# Patient Record
Sex: Male | Born: 1950 | Race: White | Hispanic: No | Smoking: Never smoker
Health system: Southern US, Community
[De-identification: ages and names within clinical notes are randomized; demographics above are authoritative.]

## PROBLEM LIST (undated history)

## (undated) DIAGNOSIS — I4891 Unspecified atrial fibrillation: Secondary | ICD-10-CM

## (undated) DIAGNOSIS — K921 Melena: Secondary | ICD-10-CM

## (undated) DIAGNOSIS — K648 Other hemorrhoids: Secondary | ICD-10-CM

## (undated) DIAGNOSIS — G4730 Sleep apnea, unspecified: Secondary | ICD-10-CM

## (undated) DIAGNOSIS — K5909 Other constipation: Secondary | ICD-10-CM

## (undated) DIAGNOSIS — N400 Enlarged prostate without lower urinary tract symptoms: Secondary | ICD-10-CM

## (undated) DIAGNOSIS — C801 Malignant (primary) neoplasm, unspecified: Secondary | ICD-10-CM

## (undated) DIAGNOSIS — I10 Essential (primary) hypertension: Secondary | ICD-10-CM

## (undated) DIAGNOSIS — E7800 Pure hypercholesterolemia, unspecified: Secondary | ICD-10-CM

## (undated) DIAGNOSIS — D500 Iron deficiency anemia secondary to blood loss (chronic): Secondary | ICD-10-CM

## (undated) DIAGNOSIS — Z8709 Personal history of other diseases of the respiratory system: Secondary | ICD-10-CM

## (undated) DIAGNOSIS — E119 Type 2 diabetes mellitus without complications: Secondary | ICD-10-CM

## (undated) DIAGNOSIS — M1990 Unspecified osteoarthritis, unspecified site: Secondary | ICD-10-CM

## (undated) DIAGNOSIS — I499 Cardiac arrhythmia, unspecified: Secondary | ICD-10-CM

## (undated) DIAGNOSIS — B019 Varicella without complication: Secondary | ICD-10-CM

## (undated) DIAGNOSIS — M48061 Spinal stenosis, lumbar region without neurogenic claudication: Secondary | ICD-10-CM

## (undated) DIAGNOSIS — K219 Gastro-esophageal reflux disease without esophagitis: Secondary | ICD-10-CM

## (undated) DIAGNOSIS — B059 Measles without complication: Secondary | ICD-10-CM

## (undated) DIAGNOSIS — B269 Mumps without complication: Secondary | ICD-10-CM

## (undated) DIAGNOSIS — D689 Coagulation defect, unspecified: Secondary | ICD-10-CM

## (undated) DIAGNOSIS — E785 Hyperlipidemia, unspecified: Secondary | ICD-10-CM

## (undated) HISTORY — PX: CARDIAC CATHETERIZATION: SHX172

## (undated) HISTORY — PX: EYE SURGERY: SHX253

## (undated) HISTORY — DX: Iron deficiency anemia secondary to blood loss (chronic): D50.0

## (undated) HISTORY — DX: Coagulation defect, unspecified: D68.9

## (undated) HISTORY — PX: POSTERIOR LAMINECTOMY / DECOMPRESSION LUMBAR SPINE: SUR740

## (undated) HISTORY — PX: CERVICAL FUSION: SHX112

## (undated) HISTORY — PX: KNEE ARTHROSCOPY: SUR90

## (undated) HISTORY — PX: BREAST SURGERY: SHX581

## (undated) HISTORY — PX: BREAST BIOPSY: SHX20

## (undated) HISTORY — PX: JOINT REPLACEMENT: SHX530

---

## 2004-04-23 ENCOUNTER — Ambulatory Visit: Payer: Self-pay | Admitting: Internal Medicine

## 2004-12-29 ENCOUNTER — Ambulatory Visit: Payer: Self-pay

## 2005-03-18 ENCOUNTER — Ambulatory Visit (HOSPITAL_COMMUNITY): Admission: RE | Admit: 2005-03-18 | Discharge: 2005-03-19 | Payer: Self-pay | Admitting: Neurosurgery

## 2009-05-02 HISTORY — PX: CARDIOVASCULAR STRESS TEST: SHX262

## 2009-05-02 HISTORY — PX: TRANSESOPHAGEAL ECHOCARDIOGRAM: SHX273

## 2009-09-04 ENCOUNTER — Inpatient Hospital Stay: Payer: Self-pay | Admitting: Internal Medicine

## 2009-09-20 ENCOUNTER — Ambulatory Visit: Payer: Self-pay

## 2009-09-23 ENCOUNTER — Ambulatory Visit: Payer: Self-pay | Admitting: Cardiovascular Disease

## 2009-11-13 ENCOUNTER — Ambulatory Visit: Payer: Self-pay | Admitting: General Practice

## 2011-11-22 ENCOUNTER — Other Ambulatory Visit: Payer: Self-pay | Admitting: Neurosurgery

## 2011-11-23 ENCOUNTER — Encounter (HOSPITAL_COMMUNITY): Payer: Self-pay | Admitting: Pharmacy Technician

## 2011-11-23 ENCOUNTER — Encounter (HOSPITAL_COMMUNITY)
Admission: RE | Admit: 2011-11-23 | Discharge: 2011-11-23 | Disposition: A | Discharge status: Home / Self Care | Payer: PRIVATE HEALTH INSURANCE | Source: Ambulatory Visit | Attending: Neurosurgery | Admitting: Neurosurgery

## 2011-11-23 ENCOUNTER — Encounter (HOSPITAL_COMMUNITY): Payer: Self-pay

## 2011-11-23 ENCOUNTER — Encounter (HOSPITAL_COMMUNITY): Payer: Self-pay | Admitting: *Deleted

## 2011-11-23 HISTORY — DX: Sleep apnea, unspecified: G47.30

## 2011-11-23 HISTORY — DX: Gastro-esophageal reflux disease without esophagitis: K21.9

## 2011-11-23 HISTORY — DX: Unspecified osteoarthritis, unspecified site: M19.90

## 2011-11-23 HISTORY — DX: Pure hypercholesterolemia, unspecified: E78.00

## 2011-11-23 LAB — CBC
HCT: 47.3 % (ref 39.0–52.0)
Hemoglobin: 16.2 g/dL (ref 13.0–17.0)
MCH: 30.6 pg (ref 26.0–34.0)
MCHC: 34.2 g/dL (ref 30.0–36.0)
Platelets: 155 10*3/uL (ref 150–400)
WBC: 5.7 10*3/uL (ref 4.0–10.5)

## 2011-11-23 LAB — SURGICAL PCR SCREEN
MRSA, PCR: NEGATIVE
Staphylococcus aureus: NEGATIVE

## 2011-11-23 NOTE — Progress Notes (Addendum)
Requested sleep study, ECHO, heart cath, stress test, EKG, last OV from Dr. Roanna Raider. Pt denies all cardiac dx, states in 2011 he had what turned out to be pleurisy, but was worked up for possible MI.

## 2011-11-23 NOTE — Pre-Procedure Instructions (Addendum)
Brandon Gibson  11/23/2011   Your procedure is scheduled on:  Friday July 26  Report to [facility] at 5:30 AM.  Call this number if you have problems the morning of surgery: [phone]   Remember:   Do not eat food:After Midnight.  May have clear liquids:until Midnight .  Clear liquids include soda, tea, black coffee, apple or grape juice, broth.  Take these medicines the morning of surgery with A SIP OF WATER: none  Stop aspirin, Coumadin, Plavix, Effient, and herbal medications today.   Do not wear jewelry, make-up or nail polish.  Do not wear lotions, powders, or perfumes. You may wear deodorant.  Do not shave 48 hours prior to surgery. Men may shave face and neck.  Do not bring valuables to the hospital.  Contacts, dentures or bridgework may not be worn into surgery.  Leave suitcase in the car. After surgery it may be brought to your room.  For patients admitted to the hospital, checkout time is 11:00 AM the day of discharge.   Patients discharged the day of surgery will not be allowed to drive home.  Name and phone number of your driver: NA  Special Instructions: CHG Shower Use Special Wash: 1/2 bottle night before surgery and 1/2 bottle morning of surgery.   Please read over the following fact sheets that you were given: Pain Booklet, Coughing and Deep Breathing and Surgical Site Infection Prevention

## 2011-11-24 MED ORDER — CEFAZOLIN SODIUM-DEXTROSE 2-3 GM-% IV SOLR
2.0000 g | INTRAVENOUS | Status: AC
  Administered 2011-11-25: 2 g via INTRAVENOUS
  Filled 2011-11-24: qty 50

## 2011-11-24 NOTE — Consult Note (Signed)
Anesthesia Chart Review:  Patient is a 62 year old male scheduled for C4-5, C5-6 ACDF on 11/25/11 by Dr. Jeral Fruit.  History includes non-smoker, obesity with BMI 32, hypercholesterolemia, GERD, OSA, arthritis, pleurisy, history of prior breast and cervical and lumbar surgeries.  PCP is Dr. Yves Dill.  Labs showed a normal CBC.  He had a work-up for chest pain in 2011.  A stress test at [facility]) on 09/15/09 showed ischemia in the RCA territory with normal EF 67%.  He subsequently underwent a cardiac cath at [facility] on 09/23/09 that showed: The coronary circulation is right dominant.  LAD, CX, RCA all showed only minor irregularities.    Echo on 09/18/09 ([facility]) showed normal LV systolic function, mild LVH with mild diastolic dysfunction, trace MR, trace PR, trace TR, normal PA pressure.  His last EKG was from 09/15/09 and showed NSR. Negative T wave in aVL.    He did not meet Anesthesia requirements for a pre-operative EKG or CXR.   Shonna Chock, PA-C

## 2011-11-25 ENCOUNTER — Encounter (HOSPITAL_COMMUNITY)
Admission: RE | Disposition: A | Discharge status: Home / Self Care | Payer: Self-pay | Source: Ambulatory Visit | Attending: Neurosurgery

## 2011-11-25 ENCOUNTER — Ambulatory Visit (HOSPITAL_COMMUNITY): Payer: PRIVATE HEALTH INSURANCE

## 2011-11-25 ENCOUNTER — Encounter (HOSPITAL_COMMUNITY): Payer: Self-pay | Admitting: *Deleted

## 2011-11-25 ENCOUNTER — Encounter (HOSPITAL_COMMUNITY): Payer: Self-pay | Admitting: Vascular Surgery

## 2011-11-25 ENCOUNTER — Inpatient Hospital Stay (HOSPITAL_COMMUNITY)
Admission: RE | Admit: 2011-11-25 | Discharge: 2011-11-27 | DRG: 473 | Disposition: A | Discharge status: Home / Self Care | Payer: PRIVATE HEALTH INSURANCE | Source: Ambulatory Visit | Attending: Neurosurgery | Admitting: Neurosurgery

## 2011-11-25 ENCOUNTER — Ambulatory Visit (HOSPITAL_COMMUNITY): Payer: PRIVATE HEALTH INSURANCE | Admitting: Vascular Surgery

## 2011-11-25 DIAGNOSIS — M47817 Spondylosis without myelopathy or radiculopathy, lumbosacral region: Principal | ICD-10-CM | POA: Diagnosis present

## 2011-11-25 DIAGNOSIS — G4730 Sleep apnea, unspecified: Secondary | ICD-10-CM | POA: Diagnosis present

## 2011-11-25 DIAGNOSIS — E7800 Pure hypercholesterolemia, unspecified: Secondary | ICD-10-CM | POA: Diagnosis present

## 2011-11-25 DIAGNOSIS — Z79899 Other long term (current) drug therapy: Secondary | ICD-10-CM

## 2011-11-25 DIAGNOSIS — K219 Gastro-esophageal reflux disease without esophagitis: Secondary | ICD-10-CM | POA: Diagnosis present

## 2011-11-25 DIAGNOSIS — Z01812 Encounter for preprocedural laboratory examination: Secondary | ICD-10-CM

## 2011-11-25 DIAGNOSIS — M129 Arthropathy, unspecified: Secondary | ICD-10-CM | POA: Diagnosis present

## 2011-11-25 DIAGNOSIS — M5030 Other cervical disc degeneration, unspecified cervical region: Secondary | ICD-10-CM | POA: Diagnosis present

## 2011-11-25 DIAGNOSIS — R1310 Dysphagia, unspecified: Secondary | ICD-10-CM | POA: Diagnosis not present

## 2011-11-25 HISTORY — PX: ANTERIOR CERVICAL DECOMP/DISCECTOMY FUSION: SHX1161

## 2011-11-25 HISTORY — DX: Personal history of other diseases of the respiratory system: Z87.09

## 2011-11-25 LAB — BASIC METABOLIC PANEL
CO2: 27 mEq/L (ref 19–32)
Calcium: 9.3 mg/dL (ref 8.4–10.5)
Chloride: 109 mEq/L (ref 96–112)
Glucose, Bld: 127 mg/dL — ABNORMAL HIGH (ref 70–99)
Sodium: 145 mEq/L (ref 135–145)

## 2011-11-25 SURGERY — ANTERIOR CERVICAL DECOMPRESSION/DISCECTOMY FUSION 2 LEVELS
Anesthesia: General | Site: Spine Cervical | Wound class: Clean

## 2011-11-25 MED ORDER — NEOSTIGMINE METHYLSULFATE 1 MG/ML IJ SOLN
INTRAMUSCULAR | Status: DC | PRN
  Administered 2011-11-25: 5 mg via INTRAVENOUS

## 2011-11-25 MED ORDER — ONDANSETRON HCL 4 MG/2ML IJ SOLN
INTRAMUSCULAR | Status: DC | PRN
  Administered 2011-11-25: 4 mg via INTRAVENOUS

## 2011-11-25 MED ORDER — GLYCOPYRROLATE 0.2 MG/ML IJ SOLN
INTRAMUSCULAR | Status: DC | PRN
  Administered 2011-11-25: .8 mg via INTRAVENOUS

## 2011-11-25 MED ORDER — MENTHOL 3 MG MT LOZG
1.0000 | LOZENGE | OROMUCOSAL | Status: DC | PRN
  Filled 2011-11-25: qty 9

## 2011-11-25 MED ORDER — HYDROMORPHONE HCL PF 1 MG/ML IJ SOLN
INTRAMUSCULAR | Status: AC
  Filled 2011-11-25: qty 1

## 2011-11-25 MED ORDER — THROMBIN 5000 UNITS EX KIT
PACK | CUTANEOUS | Status: DC | PRN
  Administered 2011-11-25 (×2): 5000 [IU] via TOPICAL

## 2011-11-25 MED ORDER — MIDAZOLAM HCL 2 MG/2ML IJ SOLN: 0.5000 mg | Freq: Once | INTRAMUSCULAR | Status: DC | PRN

## 2011-11-25 MED ORDER — DEXAMETHASONE SODIUM PHOSPHATE 4 MG/ML IJ SOLN
INTRAMUSCULAR | Status: DC | PRN
  Administered 2011-11-25: 10 mg via INTRAVENOUS

## 2011-11-25 MED ORDER — VECURONIUM BROMIDE 10 MG IV SOLR
INTRAVENOUS | Status: DC | PRN
  Administered 2011-11-25: 2 mg via INTRAVENOUS

## 2011-11-25 MED ORDER — ACETAMINOPHEN 325 MG PO TABS: 650.0000 mg | ORAL_TABLET | ORAL | Status: DC | PRN

## 2011-11-25 MED ORDER — ROCURONIUM BROMIDE 100 MG/10ML IV SOLN
INTRAVENOUS | Status: DC | PRN
  Administered 2011-11-25: 50 mg via INTRAVENOUS

## 2011-11-25 MED ORDER — PHENOL 1.4 % MT LIQD
1.0000 | OROMUCOSAL | Status: DC | PRN
  Administered 2011-11-27: 1 via OROMUCOSAL
  Filled 2011-11-25 (×2): qty 177

## 2011-11-25 MED ORDER — PANTOPRAZOLE SODIUM 40 MG PO TBEC
40.0000 mg | DELAYED_RELEASE_TABLET | Freq: Every day | ORAL | Status: DC
  Administered 2011-11-25 – 2011-11-27 (×3): 40 mg via ORAL

## 2011-11-25 MED ORDER — MIDAZOLAM HCL 5 MG/5ML IJ SOLN
INTRAMUSCULAR | Status: DC | PRN
  Administered 2011-11-25: 2 mg via INTRAVENOUS

## 2011-11-25 MED ORDER — SIMVASTATIN 20 MG PO TABS
20.0000 mg | ORAL_TABLET | Freq: Every day | ORAL | Status: DC
  Administered 2011-11-25 – 2011-11-26 (×2): 20 mg via ORAL
  Filled 2011-11-25 (×3): qty 1

## 2011-11-25 MED ORDER — OXYCODONE-ACETAMINOPHEN 5-325 MG PO TABS
1.0000 | ORAL_TABLET | ORAL | Status: DC | PRN
  Administered 2011-11-25 – 2011-11-27 (×9): 2 via ORAL
  Administered 2011-11-27: 1 via ORAL
  Administered 2011-11-27 (×2): 2 via ORAL
  Filled 2011-11-25 (×12): qty 2

## 2011-11-25 MED ORDER — SODIUM CHLORIDE 0.9 % IJ SOLN: 3.0000 mL | INTRAMUSCULAR | Status: DC | PRN

## 2011-11-25 MED ORDER — SODIUM CHLORIDE 0.9 % IV SOLN: INTRAVENOUS | Status: DC

## 2011-11-25 MED ORDER — SODIUM CHLORIDE 0.9 % IV SOLN
250.0000 mL | INTRAVENOUS | Status: DC
  Administered 2011-11-25: 250 mL via INTRAVENOUS

## 2011-11-25 MED ORDER — HEMOSTATIC AGENTS (NO CHARGE) OPTIME
TOPICAL | Status: DC | PRN
  Administered 2011-11-25: 1 via TOPICAL

## 2011-11-25 MED ORDER — FENTANYL CITRATE 0.05 MG/ML IJ SOLN
INTRAMUSCULAR | Status: DC | PRN
  Administered 2011-11-25: 250 ug via INTRAVENOUS

## 2011-11-25 MED ORDER — DEXAMETHASONE SODIUM PHOSPHATE 4 MG/ML IJ SOLN
4.0000 mg | Freq: Four times a day (QID) | INTRAMUSCULAR | Status: DC
  Administered 2011-11-27: 4 mg via INTRAVENOUS
  Filled 2011-11-25 (×8): qty 1

## 2011-11-25 MED ORDER — DIAZEPAM 5 MG PO TABS
5.0000 mg | ORAL_TABLET | Freq: Four times a day (QID) | ORAL | Status: DC | PRN
  Filled 2011-11-25: qty 1

## 2011-11-25 MED ORDER — LIDOCAINE HCL 4 % MT SOLN
OROMUCOSAL | Status: DC | PRN
  Administered 2011-11-25: 4 mL via TOPICAL

## 2011-11-25 MED ORDER — THROMBIN 5000 UNITS EX SOLR
OROMUCOSAL | Status: DC | PRN
  Administered 2011-11-25: 10:00:00 via TOPICAL

## 2011-11-25 MED ORDER — LACTATED RINGERS IV SOLN
INTRAVENOUS | Status: DC | PRN
  Administered 2011-11-25 (×2): via INTRAVENOUS

## 2011-11-25 MED ORDER — ACETAMINOPHEN 650 MG RE SUPP: 650.0000 mg | RECTAL | Status: DC | PRN

## 2011-11-25 MED ORDER — CEFAZOLIN SODIUM 1-5 GM-% IV SOLN
1.0000 g | Freq: Three times a day (TID) | INTRAVENOUS | Status: AC
  Administered 2011-11-25 (×2): 1 g via INTRAVENOUS
  Filled 2011-11-25 (×2): qty 50

## 2011-11-25 MED ORDER — SODIUM CHLORIDE 0.9 % IJ SOLN
3.0000 mL | Freq: Two times a day (BID) | INTRAMUSCULAR | Status: DC
  Administered 2011-11-25 – 2011-11-27 (×4): 3 mL via INTRAVENOUS

## 2011-11-25 MED ORDER — MORPHINE SULFATE 2 MG/ML IJ SOLN
2.0000 mg | INTRAMUSCULAR | Status: DC | PRN
  Administered 2011-11-25 – 2011-11-26 (×2): 2 mg via INTRAVENOUS
  Filled 2011-11-25 (×3): qty 1

## 2011-11-25 MED ORDER — PROMETHAZINE HCL 25 MG/ML IJ SOLN: 6.2500 mg | INTRAMUSCULAR | Status: DC | PRN

## 2011-11-25 MED ORDER — 0.9 % SODIUM CHLORIDE (POUR BTL) OPTIME
TOPICAL | Status: DC | PRN
  Administered 2011-11-25: 1000 mL

## 2011-11-25 MED ORDER — MEPERIDINE HCL 25 MG/ML IJ SOLN: 6.2500 mg | INTRAMUSCULAR | Status: DC | PRN

## 2011-11-25 MED ORDER — ONDANSETRON HCL 4 MG/2ML IJ SOLN: 4.0000 mg | INTRAMUSCULAR | Status: DC | PRN

## 2011-11-25 MED ORDER — HYDROMORPHONE HCL PF 1 MG/ML IJ SOLN
0.2500 mg | INTRAMUSCULAR | Status: DC | PRN
  Administered 2011-11-25 (×2): 0.5 mg via INTRAVENOUS

## 2011-11-25 MED ORDER — DEXAMETHASONE 4 MG PO TABS
4.0000 mg | ORAL_TABLET | Freq: Four times a day (QID) | ORAL | Status: DC
  Administered 2011-11-25 – 2011-11-27 (×8): 4 mg via ORAL
  Filled 2011-11-25 (×12): qty 1

## 2011-11-25 MED ORDER — PROPOFOL 10 MG/ML IV EMUL
INTRAVENOUS | Status: DC | PRN
  Administered 2011-11-25: 200 mg via INTRAVENOUS

## 2011-11-25 MED ORDER — LIDOCAINE HCL (CARDIAC) 20 MG/ML IV SOLN
INTRAVENOUS | Status: DC | PRN
  Administered 2011-11-25: 20 mg via INTRAVENOUS

## 2011-11-25 SURGICAL SUPPLY — 58 items
APL SKNCLS STERI-STRIP NONHPOA (GAUZE/BANDAGES/DRESSINGS) ×1
BANDAGE GAUZE ELAST BULKY 4 IN (GAUZE/BANDAGES/DRESSINGS) ×4 IMPLANT
BENZOIN TINCTURE PRP APPL 2/3 (GAUZE/BANDAGES/DRESSINGS) ×2 IMPLANT
BIT DRILL SM SPINE QC 14 (BIT) ×2 IMPLANT
BLADE ULTRA TIP 2M (BLADE) ×2 IMPLANT
BUR BARREL STRAIGHT FLUTE 4.0 (BURR) ×2 IMPLANT
BUR MATCHSTICK NEURO 3.0 LAGG (BURR) ×4 IMPLANT
CANISTER SUCTION 2500CC (MISCELLANEOUS) ×2 IMPLANT
CLOTH BEACON ORANGE TIMEOUT ST (SAFETY) ×2 IMPLANT
CONT SPEC 4OZ CLIKSEAL STRL BL (MISCELLANEOUS) ×2 IMPLANT
COVER MAYO STAND STRL (DRAPES) ×2 IMPLANT
DRAIN JACKSON PRATT 10MM FLAT (MISCELLANEOUS) ×2 IMPLANT
DRAPE LAPAROTOMY 100X72 PEDS (DRAPES) ×2 IMPLANT
DRAPE MICROSCOPE LEICA (MISCELLANEOUS) ×2 IMPLANT
DRAPE POUCH INSTRU U-SHP 10X18 (DRAPES) ×2 IMPLANT
DURAPREP 6ML APPLICATOR 50/CS (WOUND CARE) ×2 IMPLANT
ELECT REM PT RETURN 9FT ADLT (ELECTROSURGICAL) ×2
ELECTRODE REM PT RTRN 9FT ADLT (ELECTROSURGICAL) ×1 IMPLANT
EVACUATOR SILICONE 100CC (DRAIN) ×2 IMPLANT
GAUZE SPONGE 4X4 16PLY XRAY LF (GAUZE/BANDAGES/DRESSINGS) IMPLANT
GLOVE BIOGEL M 8.0 STRL (GLOVE) ×2 IMPLANT
GLOVE BIOGEL PI IND STRL 8 (GLOVE) ×1 IMPLANT
GLOVE BIOGEL PI IND STRL 8.5 (GLOVE) ×1 IMPLANT
GLOVE BIOGEL PI INDICATOR 8 (GLOVE) ×1
GLOVE BIOGEL PI INDICATOR 8.5 (GLOVE) ×1
GLOVE ECLIPSE 7.5 STRL STRAW (GLOVE) ×2 IMPLANT
GLOVE EXAM NITRILE LRG STRL (GLOVE) IMPLANT
GLOVE EXAM NITRILE MD LF STRL (GLOVE) IMPLANT
GLOVE EXAM NITRILE XL STR (GLOVE) IMPLANT
GLOVE EXAM NITRILE XS STR PU (GLOVE) IMPLANT
GLOVE SURG SS PI 8.0 STRL IVOR (GLOVE) ×4 IMPLANT
GOWN BRE IMP SLV AUR LG STRL (GOWN DISPOSABLE) ×2 IMPLANT
GOWN BRE IMP SLV AUR XL STRL (GOWN DISPOSABLE) IMPLANT
GOWN STRL REIN 2XL LVL4 (GOWN DISPOSABLE) ×2 IMPLANT
HEAD HALTER (SOFTGOODS) ×2 IMPLANT
HEMOSTAT POWDER KIT SURGIFOAM (HEMOSTASIS) ×2 IMPLANT
KIT BASIN OR (CUSTOM PROCEDURE TRAY) ×2 IMPLANT
KIT ROOM TURNOVER OR (KITS) ×2 IMPLANT
NS IRRIG 1000ML POUR BTL (IV SOLUTION) ×2 IMPLANT
PACK LAMINECTOMY NEURO (CUSTOM PROCEDURE TRAY) ×2 IMPLANT
PATTIES SURGICAL .5 X1 (DISPOSABLE) ×2 IMPLANT
PLATE ANT CERV XTEND 2 LV (Plate) ×2 IMPLANT
PUTTY DBX 1CC (Putty) ×2 IMPLANT
PUTTY DBX 1CC DEPUY (Putty) ×1 IMPLANT
RUBBERBAND STERILE (MISCELLANEOUS) ×4 IMPLANT
SCREW XTD VAR 4.2 SELF TAP (Screw) ×12 IMPLANT
SPACER ACDF SM LORDOTIC 7 (Spacer) ×2 IMPLANT
SPACER COLONIAL SMALL 8MM (Spacer) ×2 IMPLANT
SPONGE GAUZE 4X4 12PLY (GAUZE/BANDAGES/DRESSINGS) ×2 IMPLANT
SPONGE INTESTINAL PEANUT (DISPOSABLE) ×2 IMPLANT
SPONGE SURGIFOAM ABS GEL SZ50 (HEMOSTASIS) ×2 IMPLANT
STRIP CLOSURE SKIN 1/2X4 (GAUZE/BANDAGES/DRESSINGS) ×2 IMPLANT
SUT VIC AB 3-0 SH 8-18 (SUTURE) ×2 IMPLANT
SYR 20ML ECCENTRIC (SYRINGE) ×2 IMPLANT
TAPE CLOTH SURG 4X10 WHT LF (GAUZE/BANDAGES/DRESSINGS) ×2 IMPLANT
TOWEL OR 17X24 6PK STRL BLUE (TOWEL DISPOSABLE) ×2 IMPLANT
TOWEL OR 17X26 10 PK STRL BLUE (TOWEL DISPOSABLE) ×2 IMPLANT
WATER STERILE IRR 1000ML POUR (IV SOLUTION) ×2 IMPLANT

## 2011-11-25 NOTE — Anesthesia Postprocedure Evaluation (Signed)
  Anesthesia Post-op Note  Patient: Brandon Gibson  Procedure(s) Performed: Procedure(s) (LRB): ANTERIOR CERVICAL DECOMPRESSION/DISCECTOMY FUSION 2 LEVELS (N/A)  Patient Location: PACU  Anesthesia Type: General  Level of Consciousness: awake, alert  and oriented  Airway and Oxygen Therapy: Patient Spontanous Breathing  Post-op Pain: mild  Post-op Assessment: Post-op Vital signs reviewed, Patient's Cardiovascular Status Stable, Respiratory Function Stable, Patent Airway, No signs of Nausea or vomiting and Pain level controlled  Post-op Vital Signs: Reviewed and stable  Complications: No apparent anesthesia complications

## 2011-11-25 NOTE — Transfer of Care (Signed)
Immediate Anesthesia Transfer of Care Note  Patient: Brandon Gibson  Procedure(s) Performed: Procedure(s) (LRB): ANTERIOR CERVICAL DECOMPRESSION/DISCECTOMY FUSION 2 LEVELS (N/A)  Patient Location: PACU  Anesthesia Type: General  Level of Consciousness: awake, alert , oriented and patient cooperative  Airway & Oxygen Therapy: Patient Spontanous Breathing and Patient connected to face mask oxygen  Post-op Assessment: Report given to PACU RN, Post -op Vital signs reviewed and stable and Patient moving all extremities X 4  Post vital signs: Reviewed and stable  Complications: No apparent anesthesia complications

## 2011-11-25 NOTE — H&P (Signed)
Brandon Gibson is an 61 y.o. male.   Chief Complaint: neck  Patient who came to my office with complain ofn eck pain with radiation  To upper extremities associated with weakness ans sensory changes, no better with conservative treatment. Mri was positive for changes at c45 56. In 1988 he had acd at c55 in [place] Past Medical History  Diagnosis Date  . Hypercholesteremia   . Sleep apnea     sleep study Dr. Park Breed, uses CPAP  . GERD (gastroesophageal reflux disease)   . Arthritis   . H/O pleurisy     Past Surgical History  Procedure Date  . Posterior laminectomy / decompression lumbar spine   . Cervical fusion     C 6/7   . Knee arthroscopy     Right  . Breast surgery     lumpectomy  . Eye surgery     LASIK  . Cardiac catheterization     2011, [facility]  . Transesophageal echocardiogram 2011  . Cardiovascular stress test 2011    History reviewed. No pertinent family history. Social History:  reports that he has never smoked. He does not have any smokeless tobacco history on file. He reports that he drinks about 8.4 ounces of alcohol per week. He reports that he does not use illicit drugs.  Allergies: No Known Allergies  Medications Prior to Admission  Medication Sig Dispense Refill  . HYDROcodone-acetaminophen (NORCO/VICODIN) 5-325 MG per tablet Take 1-2 tablets by mouth every 6 (six) hours as needed. For pain      . lovastatin (MEVACOR) 20 MG tablet Take 20 mg by mouth at bedtime.      Marland Kitchen NIACIN PO Take 1 capsule by mouth daily. OTC formulation      . omeprazole (PRILOSEC) 20 MG capsule Take 20 mg by mouth daily.      Marland Kitchen aspirin 81 MG chewable tablet Chew 81 mg by mouth daily.        Results for orders placed during the hospital encounter of 11/25/11 (from the past 48 hour(s))  BASIC METABOLIC PANEL     Status: Abnormal   Collection Time   11/25/11  6:23 AM      Component Value Range Comment   Sodium 145  135 - 145 mEq/L    Potassium 4.2  3.5 - 5.1 mEq/L    Chloride  109  96 - 112 mEq/L    CO2 27  19 - 32 mEq/L    Glucose, Bld 127 (*) 70 - 99 mg/dL    BUN 19  6 - 23 mg/dL    Creatinine, Ser 7.84  0.50 - 1.35 mg/dL    Calcium 9.3  8.4 - 69.6 mg/dL    GFR calc non Af Amer 73 (*) >90 mL/min    GFR calc Af Amer 84 (*) >90 mL/min    No results found.  Review of Systems  Constitutional: Negative.   HENT: Positive for neck pain.   Eyes: Negative.   Respiratory: Negative.   Cardiovascular: Negative.   Genitourinary: Negative.   Skin: Negative.   Neurological: Positive for focal weakness.  Endo/Heme/Allergies: Negative.   Psychiatric/Behavioral: Negative.     Blood pressure 133/81, pulse 57, temperature 98.2 F (36.8 C), temperature source Oral, resp. rate 18, SpO2 96.00%. Physical Examhent. Nl. Neck, anterior scar in righ side. Pain with flexibility. Cv, nl. Lungs, nl. Abdomen. Nl.extremities, nl.NEURO Weakness of biceps and deltoids. dtr 1 plus. Sensory, nl. MRI c spine ddd with foraminal stenosis at c445  Assessment/Plandecompression and fusion aat c45 and c56 with cages and plate. Patient aware of risks and benefis  Gladis Soley M 11/25/2011, 7:44 AM

## 2011-11-25 NOTE — Anesthesia Preprocedure Evaluation (Signed)
Anesthesia Evaluation  Patient identified by MRN, date of birth, ID band Patient awake    Reviewed: Allergy & Precautions, H&P , NPO status , Patient's Chart, lab work & pertinent test results  History of Anesthesia Complications Negative for: history of anesthetic complications  Airway Mallampati: I TM Distance: >3 FB Neck ROM: Full    Dental No notable dental hx. (+) Teeth Intact and Dental Advisory Given   Pulmonary sleep apnea and Continuous Positive Airway Pressure Ventilation ,  breath sounds clear to auscultation  Pulmonary exam normal       Cardiovascular negative cardio ROS  Rhythm:Regular Rate:Normal  Cath '11: normal LVF, essentially normal coronaries   Neuro/Psych    GI/Hepatic Neg liver ROS, GERD-  Medicated and Controlled,  Endo/Other  negative endocrine ROS  Renal/GU negative Renal ROS     Musculoskeletal   Abdominal (+) + obese,   Peds  Hematology   Anesthesia Other Findings   Reproductive/Obstetrics                           Anesthesia Physical Anesthesia Plan  ASA: II  Anesthesia Plan: General   Post-op Pain Management:    Induction: Intravenous  Airway Management Planned: Oral ETT  Additional Equipment:   Intra-op Plan:   Post-operative Plan: Extubation in OR  Informed Consent: I have reviewed the patients History and Physical, chart, labs and discussed the procedure including the risks, benefits and alternatives for the proposed anesthesia with the patient or authorized representative who has indicated his/her understanding and acceptance.   Dental advisory given  Plan Discussed with: CRNA and Surgeon  Anesthesia Plan Comments: (Plan routine monitors, GETA)        Anesthesia Quick Evaluation

## 2011-11-25 NOTE — Progress Notes (Signed)
Patient ID: Brandon Gibson, male   DOB: May 08, 1950, 61 y.o.   MRN: [id number] Doing well. Strength back to normal. Minimal drainage

## 2011-11-25 NOTE — Anesthesia Procedure Notes (Addendum)
Procedure Name: Intubation Date/Time: 11/25/2011 7:53 AM Performed by: Marena Chancy Pre-anesthesia Checklist: Patient identified, Emergency Drugs available, Suction available, Patient being monitored and Timeout performed Patient Re-evaluated:Patient Re-evaluated prior to inductionOxygen Delivery Method: Circle system utilized Preoxygenation: Pre-oxygenation with 100% oxygen Intubation Type: IV induction Ventilation: Two handed mask ventilation required Laryngoscope Size: Mac and 3 Grade View: Grade II Tube type: Oral Tube size: 7.5 mm Number of attempts: 1 Airway Equipment and Method: Stylet Placement Confirmation: ETT inserted through vocal cords under direct vision,  positive ETCO2,  CO2 detector and breath sounds checked- equal and bilateral Secured at: 23 cm Tube secured with: Tape Dental Injury: Teeth and Oropharynx as per pre-operative assessment  Comments: Head/neck maintained in neutral position for intubation.

## 2011-11-25 NOTE — Preoperative (Signed)
Beta Blockers   Reason not to administer Beta Blockers:Not Applicable 

## 2011-11-25 NOTE — Progress Notes (Signed)
Anterior decompression and fusion was done at c45 and c56. Op note [phone]

## 2011-11-26 NOTE — Progress Notes (Signed)
CSW consult for SNF.  PT/OT with no f/u recommendations noted.  CSW signing off as no other CSW needs identified.  Please re-consult is SNF needed or other CSW needs arise. Dellie Burns, MSW, [place] [phone] (weekend)

## 2011-11-26 NOTE — Progress Notes (Signed)
Patient ID: Brandon Gibson, male   DOB: [birth year], 61 y.o.   MRN: [id number] Subjective: Patient reports doing well  Objective: Vital signs in last 24 hours: Temp:  [97.3 F (36.3 C)-98.3 F (36.8 C)] 98.2 F (36.8 C) (07/27 0920) Pulse Rate:  [45-77] 77  (07/27 0920) Resp:  [4-20] 20  (07/27 0920) BP: (108-156)/(49-77) 127/76 mmHg (07/27 0920) SpO2:  [96 %-99 %] 96 % (07/27 0920) Weight:  [102.059 kg (225 lb)] 102.059 kg (225 lb) (07/26 2144)  Intake/Output from previous day: 07/26 0701 - 07/27 0700 In: 1320 [I.V.:1320] Out: 680 [Urine:450; Drains:30; Blood:200] Intake/Output this shift: Total I/O In: 360 [P.O.:360] Out: 200 [Urine:200]  Wound:clean and dry  Lab Results:  Basename 11/23/11 1340  WBC 5.7  HGB 16.2  HCT 47.3  PLT 155   BMET  Basename 11/25/11 0623  NA 145  K 4.2  CL 109  CO2 27  GLUCOSE 127*  BUN 19  CREATININE 1.06  CALCIUM 9.3    Studies/Results: Dg Cervical Spine 2-3 Views  11/25/2011  *RADIOLOGY REPORT*  Clinical Data: Cervical disc disease.  CERVICAL SPINE - 2-3 VIEW  Comparison: None.  Findings: Radiograph #1 demonstrates a needle at the C4-5 level. Radiograph #2 demonstrates the patient has undergone anterior cervical fusion at C4-5 and C5-6.  Anterior plate, screws, and interbody fusion devices in place.  Alignment is anatomic.  IMPRESSION: Anterior cervical fusion performed at C4-5 and C5-6.  Original Report Authenticated By: Gwynn Burly, M.D.    Assessment/Plan: Doing well. Thought he might go home but he says his daughter is anxious and would prefer he stay til tomorrow. Told him to increase activity today.  LOS: 1 day  as above   Reinaldo Meeker, MD 11/26/2011, 9:51 AM

## 2011-11-26 NOTE — Op Note (Signed)
NAMEDECKLIN, [place]EDD[place] NO.:  [id number]  MEDICAL RECORD NO.:  [id number]  LOCATION:  [id number]                        FACILITY:  [facility]  PHYSICIAN:  Hilda Lias, M.D.   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  Status post lumbar surgery.  POSTOPERATIVE DIAGNOSES:  C4-5, C5-6 degenerative disk disease with bilateral chronic foraminotomy, status post anterior cervical fusion at the level of C6-7 in 1998. Status post lumbar surgery.  PROCEDURE:  Anterior 4-5, 5-6 diskectomy, decompression of the spinal cord, bilateral foraminotomy, lysis of adhesion, interbody fusion with cages and plate.  Microscope.  SURGEON:  Hilda Lias, MD  ASSISTANT:  Hewitt Shorts, MD  CLINICAL HISTORY:  The patient is a gentleman who in 78 while he was in [place] underwent fusion at the level of C6-7 with his iliac crest bone graft.  No plate was involved.  I saw this gentleman years ago.  He has a lumbar diskectomy.  Now, he comes complaining of pain going to both upper extremity with quite a bit of weakness of the deltoid and biceps.  The patient's MRI showed that he has spondylosis with stenosis at the level of 4-5 and 5-6.  The patient wanted to proceed with surgery sometime in September, but he called back telling me that he was getting worse, and he wanted to surgery as soon as possible.  The surgery was fully explained to him and well at the benefit and the risk.  PROCEDURE:  The patient was taken to the OR.  The right side of the neck was cleaned with DuraPrep.  We decided to wipe to the right side because previously he had surgery at this level.  We used a new incision because the previous one was too low.  After  the skin was cleaned with DuraPrep, drapes were applied.  Transverse incision was made through the skin, subcutaneous tissue, platysma and straight to the cervical spine.  The patient had quite a bit of adhesion, lysis was accomplished.  X-rays showed that we were at the level of 4-5.  From then on, with the help of the microscope, we opened the anterior ligament of 4-5 with the curette. We did a total gross diskectomy.  We opened the posterior ligament, decompression of the spinal cord as well as both C5 nerve roots were achieved.  At the level of 5-6, interesting, the patient has no true disk.  The patient has mostly quite a bit of fibrosis, which was quite adherent to the vertebral body.  We had to drill more posteriorly to get into the posterior aspect of the C5-C6 bone.  There was no true posterior ligament and dissection was carried out laterally.  We made the space between the disk fibrosis and the dura mater.  Using the 1 and 2 mm Kerrison punch, we decompressed both the C6 nerve root as well as the spinal cord.  The both nerve roots were really swollen.  The patient had  quite a bit of adhesion and decompression with plenty of space for the spinal cord as well as the C6 nerve root was achieved.  A large osteophyte anteriorly at the level of C6-C7 was removed.  Then, the endplate were drilled.  Plate of 7-mm, lordotic with autograft and morselized bone was inserted.  The cage was 7-mm of 4-5 and at the level of 5-6 was 8-mm. Then, plate using 6 screws from C4-C6 was done.  Lateral cervical spine showed good position of the cages as well as the plate.  Although, we achieved good hemostasis, we decided because of previous surgery of the fibrosis to leave a drain.  Then, the wound was closed with Vicryl and Steri-Strips.          ______________________________ Hilda Lias, M.D.     EB/MEDQ  D:  11/25/2011  T:  11/26/2011  Job:  [id number]

## 2011-11-26 NOTE — Progress Notes (Signed)
Occupational Therapy Evaluation Patient Details Name: Brandon Gibson MRN: [id number] DOB: [birth year] Today's Date: 11/26/2011 Time: 2952-8413 OT Time Calculation (min): 16 min  OT Assessment / Plan / Recommendation Clinical Impression  Pt s/p ACDF C4-6.  Pt is near baseline functioning with ADLs and mobility and will have necessary level of assist upon d/c home.  Educated pt on cervical precautions.  At this time, no further acute OT needed.    OT Assessment  Patient does not need any further OT services    Follow Up Recommendations  No OT follow up;Supervision/Assistance - 24 hour    Barriers to Discharge      Equipment Recommendations  None recommended by PT;None recommended by OT    Recommendations for Other Services  (none)  Frequency       Precautions / Restrictions Precautions Precautions: Cervical Required Braces or Orthoses: Cervical Brace Cervical Brace: Soft collar Restrictions Weight Bearing Restrictions: No   Pertinent Vitals/Pain See vitals    ADL  Upper Body Bathing: Performed;Set up;Chest;Right arm;Left arm;Abdomen Where Assessed - Upper Body Bathing: Unsupported sitting Lower Body Bathing: Performed;Set up Where Assessed - Lower Body Bathing: Unsupported sit to stand Upper Body Dressing: Performed;Set up Where Assessed - Upper Body Dressing: Unsupported sitting Toilet Transfer: Simulated;Min guard Toilet Transfer Method:  (ambulating) Acupuncturist: Other (comment) (EOB) Tub/Shower Transfer: Simulated;Min guard Tub/Shower Transfer Method: Science writer: Walk in Scientist, research (physical sciences) Used: Gait belt Transfers/Ambulation Related to ADLs: Mod I for sit<>stand.  Min guard for ambulation to therapy gym. ADL Comments: Pt reports some difficulty with washing back and and back peri area.  Educated pt on use of long handled sponge, and pt reports he is familar with sponge as his wife used to have one.  Educated pt on  cervical precautions and performing ADLs while adhering to them.    OT Diagnosis:    OT Problem List:   OT Treatment Interventions:     OT Goals    Visit Information  Last OT Received On: 11/26/11 Assistance Needed: +1    Subjective Data      Prior Functioning  Vision/Perception  Home Living Lives With: Daughter Available Help at Discharge: Family;Available 24 hours/day Type of Home: House Home Access: Stairs to enter [employer] of Steps: 4 Entrance Stairs-Rails: Right;Left;Can reach both Home Layout: Two level;Bed/bath upstairs Alternate Level Stairs-Number of Steps: 17 Alternate Level Stairs-Rails: Right Bathroom Shower/Tub: Tub/shower unit;Tub only Firefighter: Standard Home Adaptive Equipment: None Prior Function Level of Independence: Independent Able to Take Stairs?: Yes Driving: Yes Vocation: Part time employment Communication Communication: No difficulties Dominant Hand: Right      Cognition  Overall Cognitive Status: Appears within functional limits for tasks assessed/performed Arousal/Alertness: Awake/alert Orientation Level: Appears intact for tasks assessed Behavior During Session: [facility] for tasks performed    Extremity/Trunk Assessment Right Upper Extremity Assessment RUE ROM/Strength/Tone: Within functional levels RUE Sensation: Deficits RUE Sensation Deficits: Reports some tingling in digits but still able to perform ADLs without difficulty. RUE Coordination: WFL - gross/fine motor Left Upper Extremity Assessment LUE ROM/Strength/Tone: Within functional levels LUE Sensation: WFL - Proprioception;WFL - Light Touch LUE Coordination: WFL - gross/fine motor Right Lower Extremity Assessment RLE ROM/Strength/Tone: Within functional levels RLE Sensation: WFL - Light Touch Left Lower Extremity Assessment LLE ROM/Strength/Tone: Within functional levels LLE Sensation: WFL - Light Touch   Mobility Bed Mobility Bed Mobility: Not  assessed Transfers Sit to Stand: 6: Modified independent (Device/Increase time);With upper extremity assist;From bed Stand to Sit: 6:  Modified independent (Device/Increase time);Without upper extremity assist;To bed   Exercise    Balance    End of Session OT - End of Session Equipment Utilized During Treatment: Gait belt Activity Tolerance: Patient tolerated treatment well Patient left: in chair;with call bell/phone within reach;with family/visitor present Nurse Communication: Mobility status  GO    11/26/2011 Cipriano Mile OTR/L Pager [phone] Office [phone]  Cipriano Mile 11/26/2011, 9:00 AM

## 2011-11-26 NOTE — Evaluation (Signed)
Physical Therapy Evaluation Patient Details Name: Brandon Gibson MRN: [id number] DOB: 1950-09-27 Today's Date: 11/26/2011 Time: 4098-1191 PT Time Calculation (min): 17 min  PT Assessment / Plan / Recommendation Clinical Impression  Pt s/p ACDF C4-6. Pt is at his baseline functional level, no difficulties with ambulation or stairs. No acute PT needs, will not follow.    PT Assessment  Patent does not need any further PT services    Follow Up Recommendations  No PT follow up       Equipment Recommendations  None recommended by PT          Precautions / Restrictions Precautions Precautions: Cervical Required Braces or Orthoses: Cervical Brace Cervical Brace: Soft collar Restrictions Weight Bearing Restrictions: No         Mobility  Bed Mobility Bed Mobility: Not assessed Transfers Transfers: Sit to Stand;Stand to Sit Sit to Stand: 6: Modified independent (Device/Increase time);With upper extremity assist;From bed Stand to Sit: 6: Modified independent (Device/Increase time);Without upper extremity assist;To bed Ambulation/Gait Ambulation/Gait Assistance: 4: Min guard Ambulation Distance (Feet): 200 Feet Assistive device: None Ambulation/Gait Assistance Details: No assistance needed. Minguard as pt felt weak in the legs during ambulation. No LOB Gait Pattern: Within Functional Limits Stairs: Yes Stairs Assistance: 5: Supervision Stairs Assistance Details (indicate cue type and reason): Supervision for safety Stair Management Technique: Step to pattern;Forwards;Alternating pattern;One rail Right Number of Stairs: 5       Visit Information  Last PT Received On: 11/26/11 Assistance Needed: +1 PT/OT Co-Evaluation/Treatment: Yes       Prior Functioning  Home Living Lives With: Daughter Available Help at Discharge: Family;Available 24 hours/day Type of Home: House Home Access: Stairs to enter [employer] of Steps: 4 Entrance Stairs-Rails:  Right;Left;Can reach both Home Layout: Two level;Bed/bath upstairs Alternate Level Stairs-Number of Steps: 17 Alternate Level Stairs-Rails: Right Bathroom Shower/Tub: Tub/shower unit;Tub only Firefighter: Standard Home Adaptive Equipment: None Prior Function Level of Independence: Independent Able to Take Stairs?: Yes Driving: Yes Vocation: Part time employment Communication Communication: No difficulties Dominant Hand: Right    Cognition  Overall Cognitive Status: Appears within functional limits for tasks assessed/performed Arousal/Alertness: Awake/alert Orientation Level: Appears intact for tasks assessed Behavior During Session: [facility] for tasks performed    Extremity/Trunk Assessment Right Lower Extremity Assessment RLE ROM/Strength/Tone: Within functional levels RLE Sensation: WFL - Light Touch Left Lower Extremity Assessment LLE ROM/Strength/Tone: Within functional levels LLE Sensation: WFL - Light Touch   Balance    End of Session PT - End of Session Equipment Utilized During Treatment: Gait belt Activity Tolerance: Patient tolerated treatment well Patient left: in chair;with call bell/phone within reach;with family/visitor present Nurse Communication: Mobility status    Milana Kidney 11/26/2011, 8:44 AM  11/26/2011 Milana Kidney DPT PAGER: [phone] OFFICE: [phone]

## 2011-11-27 MED ORDER — OXYCODONE-ACETAMINOPHEN 5-325 MG PO TABS: 1.0000 | ORAL_TABLET | Freq: Four times a day (QID) | ORAL | Status: AC | PRN

## 2011-11-27 MED ORDER — DIAZEPAM 5 MG PO TABS: 5.0000 mg | ORAL_TABLET | Freq: Four times a day (QID) | ORAL | Status: AC | PRN

## 2011-11-27 NOTE — Discharge Summary (Signed)
Physician Discharge Summary  Patient ID: Brandon Gibson MRN: [id number] DOB/AGE: 1950/05/27 61 y.o.  Admit date: 11/25/2011 Discharge date: 11/27/2011  Admission Diagnoses:cervical spondylosis  Discharge Diagnoses: cervical spondylosis Active Problems:  * No active hospital problems. *    Discharged Condition: good  Hospital Course: Brandon Gibson was admitted to the hospital and taken to the operating room where he underwent an Anterior 4-5, 5-6 diskectomy, decompression of the spinal  cord, bilateral foraminotomy, lysis of adhesion, interbody fusion with  cages and plate. Microscopic dissection. Post op he had some problems with swallowing which did improve with decadron and time. Speaking voice is strong and he can handle his secretions without difficulty. Wound is clean and dry without signs of infection. Normal strength in his upper extremities.   Consults: None  Significant Diagnostic Studies: none  Treatments: surgery: as above  Discharge Exam: Blood pressure 139/69, pulse 60, temperature 98.3 F (36.8 C), temperature source Oral, resp. rate 20, height 5\' 10"  (1.778 m), weight 102.059 kg (225 lb), SpO2 97.00%. General appearance: alert, cooperative and appears stated age Neurologic: Alert and oriented X 3, normal strength and tone. Normal symmetric reflexes. Normal coordination and gait  Disposition: Home   Medication List  As of 11/27/2011 11:01 AM   STOP taking these medications         HYDROcodone-acetaminophen 5-325 MG per tablet         TAKE these medications         aspirin 81 MG chewable tablet   Chew 81 mg by mouth daily.      diazepam 5 MG tablet   Commonly known as: VALIUM   Take 1 tablet (5 mg total) by mouth every 6 (six) hours as needed.      lovastatin 20 MG tablet   Commonly known as: MEVACOR   Take 20 mg by mouth at bedtime.      NIACIN PO   Take 1 capsule by mouth daily. OTC formulation      omeprazole 20 MG capsule   Commonly  known as: PRILOSEC   Take 20 mg by mouth daily.      oxyCODONE-acetaminophen 5-325 MG per tablet   Commonly known as: PERCOCET/ROXICET   Take 1-2 tablets by mouth every 6 (six) hours as needed.             Signed: Tim Wilhide L 11/27/2011, 11:01 AM

## 2011-11-28 ENCOUNTER — Encounter (HOSPITAL_COMMUNITY): Payer: Self-pay | Admitting: Neurosurgery

## 2013-06-06 ENCOUNTER — Emergency Department: Payer: Self-pay | Admitting: Emergency Medicine

## 2013-09-03 ENCOUNTER — Other Ambulatory Visit: Payer: Self-pay | Admitting: Neurosurgery

## 2013-09-03 DIAGNOSIS — M5416 Radiculopathy, lumbar region: Secondary | ICD-10-CM

## 2013-09-11 ENCOUNTER — Ambulatory Visit
Admission: RE | Admit: 2013-09-11 | Discharge: 2013-09-11 | Disposition: A | Discharge status: Home / Self Care | Payer: Medicare PPO | Source: Ambulatory Visit | Attending: Neurosurgery | Admitting: Neurosurgery

## 2013-09-11 VITALS — BP 139/88 | HR 74

## 2013-09-11 DIAGNOSIS — M5416 Radiculopathy, lumbar region: Secondary | ICD-10-CM

## 2013-09-11 MED ORDER — MEPERIDINE HCL 100 MG/ML IJ SOLN
100.0000 mg | Freq: Once | INTRAMUSCULAR | Status: AC
  Administered 2013-09-11: 100 mg via INTRAMUSCULAR

## 2013-09-11 MED ORDER — ONDANSETRON HCL 4 MG/2ML IJ SOLN
4.0000 mg | Freq: Once | INTRAMUSCULAR | Status: AC
Start: 2013-09-11 — End: 2013-09-11
  Administered 2013-09-11: 4 mg via INTRAMUSCULAR

## 2013-09-11 MED ORDER — IOHEXOL 180 MG/ML  SOLN
15.0000 mL | Freq: Once | INTRAMUSCULAR | Status: AC | PRN
  Administered 2013-09-11: 15 mL via INTRATHECAL

## 2013-09-11 MED ORDER — DIAZEPAM 5 MG PO TABS
5.0000 mg | ORAL_TABLET | Freq: Once | ORAL | Status: AC
  Administered 2013-09-11: 5 mg via ORAL

## 2013-09-11 NOTE — Discharge Instructions (Signed)

## 2013-09-18 DIAGNOSIS — M48061 Spinal stenosis, lumbar region without neurogenic claudication: Secondary | ICD-10-CM | POA: Insufficient documentation

## 2013-09-26 ENCOUNTER — Other Ambulatory Visit: Payer: Self-pay | Admitting: Neurosurgery

## 2013-09-30 ENCOUNTER — Encounter (HOSPITAL_COMMUNITY): Payer: Self-pay

## 2013-10-01 ENCOUNTER — Encounter (HOSPITAL_COMMUNITY)
Admission: RE | Admit: 2013-10-01 | Discharge: 2013-10-01 | Disposition: A | Discharge status: Home / Self Care | Payer: Medicare PPO | Source: Ambulatory Visit | Attending: Neurosurgery | Admitting: Neurosurgery

## 2013-10-01 ENCOUNTER — Encounter (HOSPITAL_COMMUNITY): Payer: Self-pay

## 2013-10-01 HISTORY — DX: Cardiac arrhythmia, unspecified: I49.9

## 2013-10-01 HISTORY — DX: Malignant (primary) neoplasm, unspecified: C80.1

## 2013-10-01 LAB — BASIC METABOLIC PANEL
BUN: 15 mg/dL (ref 6–23)
CALCIUM: 9.5 mg/dL (ref 8.4–10.5)
CO2: 26 meq/L (ref 19–32)
Chloride: 103 mEq/L (ref 96–112)
Creatinine, Ser: 0.91 mg/dL (ref 0.50–1.35)
GFR calc Af Amer: >90 mL/min (ref >90)
GFR calc non Af Amer: 87 mL/min — ABNORMAL LOW (ref >90)
GLUCOSE: 95 mg/dL (ref 70–99)
Potassium: 3.9 mEq/L (ref 3.7–5.3)
Sodium: 142 mEq/L (ref 137–147)

## 2013-10-01 LAB — CBC
HCT: 43 % (ref 39.0–52.0)
Hemoglobin: 15.3 g/dL (ref 13.0–17.0)
MCH: 31.7 pg (ref 26.0–34.0)
MCHC: 35.6 g/dL (ref 30.0–36.0)
MCV: 89.2 fL (ref 78.0–100.0)
PLATELETS: 160 10*3/uL (ref 150–400)
RBC: 4.82 MIL/uL (ref 4.22–5.81)
RDW: 13.6 % (ref 11.5–15.5)
WBC: 5.9 10*3/uL (ref 4.0–10.5)

## 2013-10-01 LAB — SURGICAL PCR SCREEN
MRSA, PCR: NEGATIVE
Staphylococcus aureus: NEGATIVE

## 2013-10-01 NOTE — Pre-Procedure Instructions (Signed)
Brandon Gibson biljon  10/01/2013   Your procedure is scheduled on:  Friday  10/04/13  Report to [facility] Admitting at 600 AM.  Call this number if you have problems the morning of surgery: [phone]   Remember:   Do not eat food or drink liquids after midnight.   Take these medicines the morning of surgery with A SIP OF WATER: AMLOIPINE, EYE DROPS, OMEPRAZOLE (STOP ASPIRIN COUMADIN, PLAVIX, EFFIENT, HERBAL MEDICINES)   Do not wear jewelry, make-up or nail polish.  Do not wear lotions, powders, or perfumes. You may wear deodorant.  Do not shave 48 hours prior to surgery. Men may shave face and neck.  Do not bring valuables to the hospital.  [facility] is not responsible                  for any belongings or valuables.               Contacts, dentures or bridgework may not be worn into surgery.  Leave suitcase in the car. After surgery it may be brought to your room.  For patients admitted to the hospital, discharge time is determined by your                treatment team.               Patients discharged the day of surgery will not be allowed to drive  home.  Name and phone number of your driver:   Special Instructions:  Special Instructions: [facility] - Preparing for Surgery  Before surgery, you can play an important role.  Because skin is not sterile, your skin needs to be as free of germs as possible.  You can reduce the number of germs on you skin by washing with CHG (chlorahexidine gluconate) soap before surgery.  CHG is an antiseptic cleaner which kills germs and bonds with the skin to continue killing germs even after washing.  Please DO NOT use if you have an allergy to CHG or antibacterial soaps.  If your skin becomes reddened/irritated stop using the CHG and inform your nurse when you arrive at Short Stay.  Do not shave (including legs and underarms) for at least 48 hours prior to the first CHG shower.  You may shave your face.  Please follow these  instructions carefully:   1.  Shower with CHG Soap the night before surgery and the morning of Surgery.  2.  If you choose to wash your hair, wash your hair first as usual with your normal shampoo.  3.  After you shampoo, rinse your hair and body thoroughly to remove the Shampoo.  4.  Use CHG as you would any other liquid soap. You can apply chg directly to the skin and wash gently with scrungie or a clean washcloth.  5.  Apply the CHG Soap to your body ONLY FROM THE NECK DOWN.  Do not use on open wounds or open sores.  Avoid contact with your eyes, ears, mouth and genitals (private parts).  Wash genitals (private parts with your normal soap.  6.  Wash thoroughly, paying special attention to the area where your surgery will be performed.  7.  Thoroughly rinse your body with warm water from the neck down.  8.  DO NOT shower/wash with your normal soap after using and rinsing off the CHG Soap.  9.  Pat yourself dry with a clean towel.  10.  Wear clean pajamas.            11.  Place clean sheets on your bed the night of your first shower and do not sleep with pets.  Day of Surgery  Do not apply any lotions/deodorants the morning of surgery.  Please wear clean clothes to the hospital/surgery center.   Please read over the following fact sheets that you were given: Pain Booklet, Coughing and Deep Breathing, Total Joint Packet, MRSA Information and Surgical Site Infection Prevention

## 2013-10-01 NOTE — Progress Notes (Signed)
REQUESTED STRESS TEST, ECHO, EKG, CARDIAC CATH, OV FROM DR Willowbrook

## 2013-10-03 NOTE — H&P (Signed)
Brandon Gibson biljon is an 63 y.o. male.   Chief Complaint: lumbar pain HPI: patient complaining of lumbar pain with radiation to both lower extremities up to the point than going up stairs is quite painful. Also he needs to stop while walking .   Past Medical History  Diagnosis Date  . Hypercholesteremia   . Sleep apnea     sleep study Dr. Chancy Milroy, uses CPAP  . GERD (gastroesophageal reflux disease)   . Arthritis   . H/O pleurisy   . Dysrhythmia     IRREG HEART BEAT  . Cancer     HX SKIN CANCER     Past Surgical History  Procedure Laterality Date  . Posterior laminectomy / decompression lumbar spine    . Cervical fusion      C 6/7   . Knee arthroscopy      Right  . Breast surgery      lumpectomy  . Eye surgery      LASIK  . Transesophageal echocardiogram  2011  . Cardiovascular stress test  2011  . Anterior cervical decomp/discectomy fusion  11/25/2011    Procedure: ANTERIOR CERVICAL DECOMPRESSION/DISCECTOMY FUSION 2 LEVELS;  Surgeon: Floyce Stakes, MD;  Location: [facility] ORS;  Service: Neurosurgery;  Laterality: N/A;  Cervical four-five,Cervical five-six  Anterior cervical decompression/diskectomy, fusion, plate  . Cardiac catheterization      2011, [facility]    No family history on file. Social History:  reports that he has never smoked. He does not have any smokeless tobacco history on file. He reports that he drinks about 8.4 ounces of alcohol per week. He reports that he does not use illicit drugs.  Allergies:  Allergies  Allergen Reactions  . Morphine And Related Anaphylaxis  . Ace Inhibitors Swelling    No prescriptions prior to admission    No results found for this or any previous visit (from the past 48 hour(s)). No results found.  Review of Systems  Constitutional: Negative.   HENT: Negative.   Eyes: Negative.   Respiratory: Negative.   Cardiovascular: Negative.   Gastrointestinal: Negative.   Genitourinary: Negative.   Musculoskeletal: Positive for  back pain.  Neurological: Positive for sensory change and focal weakness.  Endo/Heme/Allergies: Negative.   Psychiatric/Behavioral: Negative.     There were no vitals taken for this visit. Physical Exam  Hent, nl. Neck, anterior scar. Cv, nl. Lungs, clear. Abdomen, soft. Extremities, nl. NEURO no weakness. Dtr, nl. Femoral stretch maneuver is positive bilaterally. thwe myelo gram show severe stenosis at l2-3 with postop changes at l4-5 Assessment/Plan Patient wants to go ahead with surgery. The procedure will be a l2-3 decompression and possible PLA. He is aware of risks and benefits  Floyce Stakes 10/03/2013, 6:00 PM

## 2013-10-04 ENCOUNTER — Ambulatory Visit (HOSPITAL_COMMUNITY): Payer: Medicare PPO | Admitting: Certified Registered"

## 2013-10-04 ENCOUNTER — Encounter (HOSPITAL_COMMUNITY): Payer: Self-pay | Admitting: *Deleted

## 2013-10-04 ENCOUNTER — Ambulatory Visit (HOSPITAL_COMMUNITY): Payer: Medicare PPO

## 2013-10-04 ENCOUNTER — Encounter (HOSPITAL_COMMUNITY)
Admission: RE | Disposition: A | Discharge status: Home / Self Care | Payer: Self-pay | Source: Ambulatory Visit | Attending: Neurosurgery

## 2013-10-04 ENCOUNTER — Encounter (HOSPITAL_COMMUNITY): Payer: Medicare PPO | Admitting: Certified Registered"

## 2013-10-04 ENCOUNTER — Inpatient Hospital Stay (HOSPITAL_COMMUNITY)
Admission: RE | Admit: 2013-10-04 | Discharge: 2013-10-07 | DRG: 460 | Disposition: A | Discharge status: Home / Self Care | Payer: Medicare PPO | Source: Ambulatory Visit | Attending: Neurosurgery | Admitting: Neurosurgery

## 2013-10-04 DIAGNOSIS — M48062 Spinal stenosis, lumbar region with neurogenic claudication: Principal | ICD-10-CM | POA: Diagnosis present

## 2013-10-04 DIAGNOSIS — Z01818 Encounter for other preprocedural examination: Secondary | ICD-10-CM

## 2013-10-04 DIAGNOSIS — E7800 Pure hypercholesterolemia, unspecified: Secondary | ICD-10-CM | POA: Diagnosis present

## 2013-10-04 DIAGNOSIS — Z01812 Encounter for preprocedural laboratory examination: Secondary | ICD-10-CM

## 2013-10-04 DIAGNOSIS — G4730 Sleep apnea, unspecified: Secondary | ICD-10-CM | POA: Diagnosis present

## 2013-10-04 DIAGNOSIS — K219 Gastro-esophageal reflux disease without esophagitis: Secondary | ICD-10-CM | POA: Diagnosis present

## 2013-10-04 DIAGNOSIS — Z85828 Personal history of other malignant neoplasm of skin: Secondary | ICD-10-CM

## 2013-10-04 DIAGNOSIS — Z981 Arthrodesis status: Secondary | ICD-10-CM

## 2013-10-04 DIAGNOSIS — Z01810 Encounter for preprocedural cardiovascular examination: Secondary | ICD-10-CM

## 2013-10-04 DIAGNOSIS — K5900 Constipation, unspecified: Secondary | ICD-10-CM | POA: Diagnosis not present

## 2013-10-04 HISTORY — PX: LUMBAR LAMINECTOMY/DECOMPRESSION MICRODISCECTOMY: SHX5026

## 2013-10-04 SURGERY — LUMBAR LAMINECTOMY/DECOMPRESSION MICRODISCECTOMY 1 LEVEL
Anesthesia: General

## 2013-10-04 MED ORDER — SODIUM CHLORIDE 0.9 % IV SOLN
INTRAVENOUS | Status: DC
  Administered 2013-10-04 – 2013-10-05 (×2): via INTRAVENOUS

## 2013-10-04 MED ORDER — DIPHENHYDRAMINE HCL 12.5 MG/5ML PO ELIX: 12.5000 mg | ORAL_SOLUTION | Freq: Four times a day (QID) | ORAL | Status: DC | PRN

## 2013-10-04 MED ORDER — SUCCINYLCHOLINE CHLORIDE 20 MG/ML IJ SOLN
INTRAMUSCULAR | Status: DC | PRN
  Administered 2013-10-04: 100 mg via INTRAVENOUS

## 2013-10-04 MED ORDER — MIDAZOLAM HCL 2 MG/2ML IJ SOLN
INTRAMUSCULAR | Status: AC
  Filled 2013-10-04: qty 2

## 2013-10-04 MED ORDER — MENTHOL 3 MG MT LOZG: 1.0000 | LOZENGE | OROMUCOSAL | Status: DC | PRN

## 2013-10-04 MED ORDER — SODIUM CHLORIDE 0.9 % IV SOLN: INTRAVENOUS | Status: DC

## 2013-10-04 MED ORDER — HEMOSTATIC AGENTS (NO CHARGE) OPTIME
TOPICAL | Status: DC | PRN
  Administered 2013-10-04: 1 via TOPICAL

## 2013-10-04 MED ORDER — SUFENTANIL CITRATE 50 MCG/ML IV SOLN
INTRAVENOUS | Status: AC
  Filled 2013-10-04: qty 1

## 2013-10-04 MED ORDER — LIDOCAINE HCL (CARDIAC) 20 MG/ML IV SOLN
INTRAVENOUS | Status: AC
  Filled 2013-10-04: qty 5

## 2013-10-04 MED ORDER — BUPIVACAINE LIPOSOME 1.3 % IJ SUSP
20.0000 mL | Freq: Once | INTRAMUSCULAR | Status: DC
  Filled 2013-10-04: qty 20

## 2013-10-04 MED ORDER — DIPHENHYDRAMINE HCL 50 MG/ML IJ SOLN
INTRAMUSCULAR | Status: AC
  Filled 2013-10-04: qty 1

## 2013-10-04 MED ORDER — ONDANSETRON HCL 4 MG/2ML IJ SOLN: 4.0000 mg | INTRAMUSCULAR | Status: DC | PRN

## 2013-10-04 MED ORDER — SODIUM CHLORIDE 0.9 % IV SOLN: 250.0000 mL | INTRAVENOUS | Status: DC

## 2013-10-04 MED ORDER — DIAZEPAM 5 MG PO TABS
ORAL_TABLET | ORAL | Status: AC
  Filled 2013-10-04: qty 1

## 2013-10-04 MED ORDER — FENTANYL CITRATE 0.05 MG/ML IJ SOLN
INTRAMUSCULAR | Status: AC
  Filled 2013-10-04: qty 2

## 2013-10-04 MED ORDER — CEFAZOLIN SODIUM-DEXTROSE 2-3 GM-% IV SOLR
INTRAVENOUS | Status: AC
  Administered 2013-10-04: 2 g via INTRAVENOUS
  Filled 2013-10-04: qty 50

## 2013-10-04 MED ORDER — PROPOFOL 10 MG/ML IV BOLUS
INTRAVENOUS | Status: AC
  Filled 2013-10-04: qty 20

## 2013-10-04 MED ORDER — NALOXONE HCL 0.4 MG/ML IJ SOLN: 0.4000 mg | INTRAMUSCULAR | Status: DC | PRN

## 2013-10-04 MED ORDER — METOCLOPRAMIDE HCL 5 MG/ML IJ SOLN: 10.0000 mg | Freq: Once | INTRAMUSCULAR | Status: DC | PRN

## 2013-10-04 MED ORDER — ZOLPIDEM TARTRATE 5 MG PO TABS: 5.0000 mg | ORAL_TABLET | Freq: Every evening | ORAL | Status: DC | PRN

## 2013-10-04 MED ORDER — ACETAMINOPHEN 325 MG PO TABS: 650.0000 mg | ORAL_TABLET | ORAL | Status: DC | PRN

## 2013-10-04 MED ORDER — 0.9 % SODIUM CHLORIDE (POUR BTL) OPTIME
TOPICAL | Status: DC | PRN
  Administered 2013-10-04: 1000 mL

## 2013-10-04 MED ORDER — THROMBIN 5000 UNITS EX SOLR
CUTANEOUS | Status: DC | PRN
  Administered 2013-10-04: 5000 [IU] via TOPICAL

## 2013-10-04 MED ORDER — ONDANSETRON HCL 4 MG/2ML IJ SOLN: 4.0000 mg | Freq: Four times a day (QID) | INTRAMUSCULAR | Status: DC | PRN

## 2013-10-04 MED ORDER — DIPHENHYDRAMINE HCL 50 MG/ML IJ SOLN
12.5000 mg | Freq: Four times a day (QID) | INTRAMUSCULAR | Status: DC | PRN
  Administered 2013-10-04: 6.25 mg via INTRAVENOUS

## 2013-10-04 MED ORDER — PHENOL 1.4 % MT LIQD: 1.0000 | OROMUCOSAL | Status: DC | PRN

## 2013-10-04 MED ORDER — DEXAMETHASONE SODIUM PHOSPHATE 4 MG/ML IJ SOLN
INTRAMUSCULAR | Status: DC | PRN
  Administered 2013-10-04: 4 mg via INTRAVENOUS

## 2013-10-04 MED ORDER — OXYCODONE HCL 5 MG PO TABS
5.0000 mg | ORAL_TABLET | Freq: Once | ORAL | Status: AC | PRN
  Administered 2013-10-04: 5 mg via ORAL

## 2013-10-04 MED ORDER — HYDROCHLOROTHIAZIDE 25 MG PO TABS
25.0000 mg | ORAL_TABLET | Freq: Every day | ORAL | Status: DC
  Administered 2013-10-05 – 2013-10-07 (×3): 25 mg via ORAL
  Filled 2013-10-04 (×4): qty 1

## 2013-10-04 MED ORDER — ONDANSETRON HCL 4 MG/2ML IJ SOLN
INTRAMUSCULAR | Status: AC
  Filled 2013-10-04: qty 2

## 2013-10-04 MED ORDER — DIPHENHYDRAMINE HCL 50 MG/ML IJ SOLN: 12.5000 mg | Freq: Four times a day (QID) | INTRAMUSCULAR | Status: DC | PRN

## 2013-10-04 MED ORDER — ROCURONIUM BROMIDE 50 MG/5ML IV SOLN
INTRAVENOUS | Status: AC
  Filled 2013-10-04: qty 1

## 2013-10-04 MED ORDER — SUCCINYLCHOLINE CHLORIDE 20 MG/ML IJ SOLN
INTRAMUSCULAR | Status: AC
  Filled 2013-10-04: qty 1

## 2013-10-04 MED ORDER — VANCOMYCIN HCL 1000 MG IV SOLR
INTRAVENOUS | Status: AC
  Filled 2013-10-04: qty 2000

## 2013-10-04 MED ORDER — SODIUM CHLORIDE 0.9 % IJ SOLN
INTRAMUSCULAR | Status: AC
  Filled 2013-10-04: qty 10

## 2013-10-04 MED ORDER — FENTANYL 10 MCG/ML IV SOLN
INTRAVENOUS | Status: DC
  Administered 2013-10-04: 45 ug via INTRAVENOUS
  Administered 2013-10-04: 15 ug via INTRAVENOUS
  Administered 2013-10-04: 15:00:00 via INTRAVENOUS
  Administered 2013-10-05: 32 ug via INTRAVENOUS
  Filled 2013-10-04: qty 50

## 2013-10-04 MED ORDER — ACETAMINOPHEN 650 MG RE SUPP: 650.0000 mg | RECTAL | Status: DC | PRN

## 2013-10-04 MED ORDER — FENTANYL CITRATE 0.05 MG/ML IJ SOLN
25.0000 ug | INTRAMUSCULAR | Status: DC | PRN
  Administered 2013-10-04 (×3): 50 ug via INTRAVENOUS

## 2013-10-04 MED ORDER — SODIUM CHLORIDE 0.9 % IJ SOLN
3.0000 mL | Freq: Two times a day (BID) | INTRAMUSCULAR | Status: DC
  Administered 2013-10-04: 3 mL via INTRAVENOUS

## 2013-10-04 MED ORDER — VANCOMYCIN HCL 1000 MG IV SOLR
INTRAVENOUS | Status: DC | PRN
  Administered 2013-10-04 (×2): 1000 mg

## 2013-10-04 MED ORDER — POTASSIUM CHLORIDE ER 10 MEQ PO TBCR
10.0000 meq | EXTENDED_RELEASE_TABLET | Freq: Every day | ORAL | Status: DC
  Administered 2013-10-05 – 2013-10-07 (×3): 10 meq via ORAL
  Filled 2013-10-04 (×4): qty 1

## 2013-10-04 MED ORDER — MIDAZOLAM HCL 5 MG/5ML IJ SOLN
INTRAMUSCULAR | Status: DC | PRN
  Administered 2013-10-04: 2 mg via INTRAVENOUS

## 2013-10-04 MED ORDER — SUFENTANIL CITRATE 50 MCG/ML IV SOLN
INTRAVENOUS | Status: DC | PRN
  Administered 2013-10-04: 30 ug via INTRAVENOUS
  Administered 2013-10-04: 10 ug via INTRAVENOUS

## 2013-10-04 MED ORDER — OXYCODONE-ACETAMINOPHEN 5-325 MG PO TABS
1.0000 | ORAL_TABLET | ORAL | Status: DC | PRN
Start: 2013-10-04 — End: 2013-10-05
  Administered 2013-10-05: 1 via ORAL
  Administered 2013-10-05: 2 via ORAL
  Administered 2013-10-05: 1 via ORAL
  Filled 2013-10-04 (×2): qty 2

## 2013-10-04 MED ORDER — FENTANYL CITRATE 0.05 MG/ML IJ SOLN
50.0000 ug | INTRAMUSCULAR | Status: DC | PRN
  Administered 2013-10-04 (×2): 50 ug via INTRAVENOUS

## 2013-10-04 MED ORDER — OXYCODONE HCL 5 MG PO TABS
ORAL_TABLET | ORAL | Status: AC
  Filled 2013-10-04: qty 1

## 2013-10-04 MED ORDER — SUCRALFATE 1 G PO TABS
1.0000 g | ORAL_TABLET | Freq: Three times a day (TID) | ORAL | Status: DC
  Administered 2013-10-04 – 2013-10-07 (×10): 1 g via ORAL
  Filled 2013-10-04 (×16): qty 1

## 2013-10-04 MED ORDER — PROPOFOL 10 MG/ML IV BOLUS
INTRAVENOUS | Status: DC | PRN
  Administered 2013-10-04: 200 mg via INTRAVENOUS

## 2013-10-04 MED ORDER — DIAZEPAM 5 MG PO TABS
5.0000 mg | ORAL_TABLET | Freq: Four times a day (QID) | ORAL | Status: DC | PRN
  Administered 2013-10-04 – 2013-10-06 (×2): 5 mg via ORAL
  Filled 2013-10-04 (×2): qty 1

## 2013-10-04 MED ORDER — LIDOCAINE HCL (CARDIAC) 20 MG/ML IV SOLN
INTRAVENOUS | Status: DC | PRN
  Administered 2013-10-04: 40 mg via INTRAVENOUS

## 2013-10-04 MED ORDER — OXYCODONE HCL 5 MG/5ML PO SOLN: 5.0000 mg | Freq: Once | ORAL | Status: AC | PRN

## 2013-10-04 MED ORDER — SODIUM CHLORIDE 0.9 % IJ SOLN
3.0000 mL | INTRAMUSCULAR | Status: DC | PRN
Start: 2013-10-04 — End: 2013-10-05

## 2013-10-04 MED ORDER — SODIUM CHLORIDE 0.9 % IJ SOLN: 9.0000 mL | INTRAMUSCULAR | Status: DC | PRN

## 2013-10-04 MED ORDER — CEFAZOLIN SODIUM 1-5 GM-% IV SOLN
1.0000 g | Freq: Three times a day (TID) | INTRAVENOUS | Status: DC
  Filled 2013-10-04 (×2): qty 50

## 2013-10-04 MED ORDER — SIMVASTATIN 20 MG PO TABS
20.0000 mg | ORAL_TABLET | Freq: Every day | ORAL | Status: DC
  Administered 2013-10-04 – 2013-10-06 (×3): 20 mg via ORAL
  Filled 2013-10-04 (×6): qty 1

## 2013-10-04 MED ORDER — BUPIVACAINE LIPOSOME 1.3 % IJ SUSP
INTRAMUSCULAR | Status: DC | PRN
  Administered 2013-10-04: 20 mL

## 2013-10-04 MED ORDER — ONDANSETRON HCL 4 MG/2ML IJ SOLN
INTRAMUSCULAR | Status: DC | PRN
  Administered 2013-10-04: 4 mg via INTRAVENOUS

## 2013-10-04 MED ORDER — OXYCODONE-ACETAMINOPHEN 5-325 MG PO TABS
1.0000 | ORAL_TABLET | ORAL | Status: DC | PRN
  Administered 2013-10-05: 2 via ORAL
  Administered 2013-10-06 – 2013-10-07 (×7): 1 via ORAL
  Filled 2013-10-04 (×6): qty 1
  Filled 2013-10-04: qty 2
  Filled 2013-10-04: qty 1

## 2013-10-04 MED ORDER — ROCURONIUM BROMIDE 100 MG/10ML IV SOLN
INTRAVENOUS | Status: DC | PRN
Start: 2013-10-04 — End: 2013-10-04
  Administered 2013-10-04: 30 mg via INTRAVENOUS

## 2013-10-04 MED ORDER — LACTATED RINGERS IV SOLN
INTRAVENOUS | Status: DC
  Administered 2013-10-04 (×2): via INTRAVENOUS

## 2013-10-04 MED ORDER — DIAZEPAM 5 MG PO TABS: 5.0000 mg | ORAL_TABLET | Freq: Four times a day (QID) | ORAL | Status: DC | PRN

## 2013-10-04 MED ORDER — AMLODIPINE BESYLATE 5 MG PO TABS
5.0000 mg | ORAL_TABLET | Freq: Every day | ORAL | Status: DC
  Administered 2013-10-05 – 2013-10-07 (×3): 5 mg via ORAL
  Filled 2013-10-04 (×3): qty 1

## 2013-10-04 MED ORDER — SODIUM CHLORIDE 0.9 % IJ SOLN: 3.0000 mL | INTRAMUSCULAR | Status: DC | PRN

## 2013-10-04 MED ORDER — CEFAZOLIN SODIUM 1-5 GM-% IV SOLN
1.0000 g | Freq: Three times a day (TID) | INTRAVENOUS | Status: AC
  Administered 2013-10-04 – 2013-10-05 (×2): 1 g via INTRAVENOUS
  Filled 2013-10-04 (×2): qty 50

## 2013-10-04 SURGICAL SUPPLY — 58 items
APL SKNCLS STERI-STRIP NONHPOA (GAUZE/BANDAGES/DRESSINGS) ×1
BENZOIN TINCTURE PRP APPL 2/3 (GAUZE/BANDAGES/DRESSINGS) ×3 IMPLANT
BLADE 10 SAFETY STRL DISP (BLADE) ×3 IMPLANT
BLADE SURG ROTATE 9660 (MISCELLANEOUS) IMPLANT
BUR ACORN 6.0 (BURR) ×2 IMPLANT
BUR ACORN 6.0MM (BURR) ×1
BUR MATCHSTICK NEURO 3.0 LAGG (BURR) IMPLANT
CANISTER SUCT 3000ML (MISCELLANEOUS) ×3 IMPLANT
CLOSURE WOUND 1/2 X4 (GAUZE/BANDAGES/DRESSINGS) ×1
CONT SPEC 4OZ CLIKSEAL STRL BL (MISCELLANEOUS) ×3 IMPLANT
DRAPE LAPAROTOMY 100X72X124 (DRAPES) ×3 IMPLANT
DRAPE MICROSCOPE LEICA (MISCELLANEOUS) ×3 IMPLANT
DRAPE POUCH INSTRU U-SHP 10X18 (DRAPES) ×3 IMPLANT
DRSG OPSITE 4X5.5 SM (GAUZE/BANDAGES/DRESSINGS) ×3 IMPLANT
DRSG OPSITE POSTOP 4X8 (GAUZE/BANDAGES/DRESSINGS) ×3 IMPLANT
DURAPREP 26ML APPLICATOR (WOUND CARE) ×3 IMPLANT
ELECT REM PT RETURN 9FT ADLT (ELECTROSURGICAL) ×3
ELECTRODE REM PT RTRN 9FT ADLT (ELECTROSURGICAL) ×1 IMPLANT
EVACUATOR 3/16  PVC DRAIN (DRAIN) ×2
EVACUATOR 3/16 PVC DRAIN (DRAIN) ×1 IMPLANT
GAUZE SPONGE 4X4 16PLY XRAY LF (GAUZE/BANDAGES/DRESSINGS) IMPLANT
GLOVE BIO SURGEON STRL SZ8 (GLOVE) ×3 IMPLANT
GLOVE BIOGEL M 8.0 STRL (GLOVE) ×3 IMPLANT
GLOVE EXAM NITRILE LRG STRL (GLOVE) IMPLANT
GLOVE EXAM NITRILE MD LF STRL (GLOVE) IMPLANT
GLOVE EXAM NITRILE XL STR (GLOVE) IMPLANT
GLOVE EXAM NITRILE XS STR PU (GLOVE) IMPLANT
GOWN STRL REUS W/ TWL LRG LVL3 (GOWN DISPOSABLE) ×2 IMPLANT
GOWN STRL REUS W/ TWL XL LVL3 (GOWN DISPOSABLE) ×1 IMPLANT
GOWN STRL REUS W/TWL 2XL LVL3 (GOWN DISPOSABLE) IMPLANT
GOWN STRL REUS W/TWL LRG LVL3 (GOWN DISPOSABLE) ×4
GOWN STRL REUS W/TWL XL LVL3 (GOWN DISPOSABLE) ×3
KIT BASIN OR (CUSTOM PROCEDURE TRAY) ×3 IMPLANT
KIT ROOM TURNOVER OR (KITS) ×3 IMPLANT
NEEDLE HYPO 18GX1.5 BLUNT FILL (NEEDLE) IMPLANT
NEEDLE HYPO 21X1.5 SAFETY (NEEDLE) IMPLANT
NEEDLE HYPO 25X1 1.5 SAFETY (NEEDLE) IMPLANT
NEEDLE SPNL 20GX3.5 QUINCKE YW (NEEDLE) IMPLANT
NS IRRIG 1000ML POUR BTL (IV SOLUTION) ×3 IMPLANT
PACK LAMINECTOMY NEURO (CUSTOM PROCEDURE TRAY) ×3 IMPLANT
PAD ABD 8X10 STRL (GAUZE/BANDAGES/DRESSINGS) IMPLANT
PAD ARMBOARD 7.5X6 YLW CONV (MISCELLANEOUS) ×9 IMPLANT
PATTIES SURGICAL .5 X1 (DISPOSABLE) ×3 IMPLANT
RUBBERBAND STERILE (MISCELLANEOUS) ×6 IMPLANT
SPONGE GAUZE 4X4 12PLY (GAUZE/BANDAGES/DRESSINGS) ×3 IMPLANT
SPONGE LAP 4X18 X RAY DECT (DISPOSABLE) IMPLANT
SPONGE SURGIFOAM ABS GEL SZ50 (HEMOSTASIS) ×3 IMPLANT
STRIP CLOSURE SKIN 1/2X4 (GAUZE/BANDAGES/DRESSINGS) ×2 IMPLANT
SUT VIC AB 0 CT1 18XCR BRD8 (SUTURE) ×2 IMPLANT
SUT VIC AB 0 CT1 8-18 (SUTURE) ×4
SUT VIC AB 2-0 CP2 18 (SUTURE) ×3 IMPLANT
SUT VIC AB 3-0 SH 8-18 (SUTURE) ×3 IMPLANT
SYR 20CC LL (SYRINGE) IMPLANT
SYR 20ML ECCENTRIC (SYRINGE) ×3 IMPLANT
SYR 5ML LL (SYRINGE) IMPLANT
TOWEL OR 17X24 6PK STRL BLUE (TOWEL DISPOSABLE) ×3 IMPLANT
TOWEL OR 17X26 10 PK STRL BLUE (TOWEL DISPOSABLE) ×3 IMPLANT
WATER STERILE IRR 1000ML POUR (IV SOLUTION) ×3 IMPLANT

## 2013-10-04 NOTE — Clinical Social Work Note (Signed)
CSW consulted for possible SNF placement. CSW now following for possible SNF placement pending PT/OT evaluation and recommendation. Thank you for the referral.  Lubertha Sayres, MSW, [facility] Licensed Clinical Social Worker [phone] and [phone]

## 2013-10-04 NOTE — Progress Notes (Signed)
Utilization review completed. Floetta Brickey, RN, BSN. 

## 2013-10-04 NOTE — Transfer of Care (Signed)
Immediate Anesthesia Transfer of Care Note  Patient: Brandon Gibson biljon  Procedure(s) Performed: Procedure(s) with comments: LUMBAR TWO TO THREE LUMBAR LAMINECTOMY/DECOMPRESSION MICRODISCECTOMY 1 LEVEL (N/A) - L2-3 Laminectomy  Patient Location: PACU  Anesthesia Type:General  Level of Consciousness: awake, alert , oriented and patient cooperative  Airway & Oxygen Therapy: Patient Spontanous Breathing and Patient connected to nasal cannula oxygen  Post-op Assessment: Report given to PACU RN, Post -op Vital signs reviewed and stable and Patient moving all extremities  Post vital signs: Reviewed and stable  Complications: No apparent anesthesia complications

## 2013-10-04 NOTE — Progress Notes (Signed)
Pt. Arrived from PACU to unit.  Vital signs and assessments were stable.  Will continue to monitor patient.

## 2013-10-04 NOTE — Anesthesia Postprocedure Evaluation (Signed)
Anesthesia Post Note  Patient: Brandon Gibson  Procedure(s) Performed: Procedure(s) (LRB): LUMBAR TWO TO THREE LUMBAR LAMINECTOMY/DECOMPRESSION MICRODISCECTOMY 1 LEVEL (N/A)  Anesthesia type: General  Patient location: PACU  Post pain: Pain level controlled  Post assessment: Patient's Cardiovascular Status Stable  Last Vitals:  Filed Vitals:   10/04/13 1430  BP: 148/72  Pulse: 43  Temp:   Resp: 30    Post vital signs: Reviewed and stable  Level of consciousness: alert  Complications: No apparent anesthesia complications

## 2013-10-04 NOTE — Anesthesia Procedure Notes (Signed)
Procedure Name: Intubation Date/Time: 10/04/2013 12:07 PM Performed by: Melina Copa, Owynn Mosqueda R Pre-anesthesia Checklist: Patient identified, Emergency Drugs available, Suction available, Patient being monitored and Timeout performed Patient Re-evaluated:Patient Re-evaluated prior to inductionOxygen Delivery Method: Circle system utilized Preoxygenation: Pre-oxygenation with 100% oxygen Intubation Type: IV induction Ventilation: Mask ventilation without difficulty Tube type: Oral Tube size: 8.0 mm Number of attempts: 1 Airway Equipment and Method: Rigid stylet and Video-laryngoscopy Placement Confirmation: ETT inserted through vocal cords under direct vision,  positive ETCO2 and breath sounds checked- equal and bilateral Secured at: 24 cm Tube secured with: Tape Dental Injury: Teeth and Oropharynx as per pre-operative assessment

## 2013-10-04 NOTE — Anesthesia Preprocedure Evaluation (Addendum)
Anesthesia Evaluation  Patient identified by MRN, date of birth, ID band Patient awake    Reviewed: Allergy & Precautions, H&P , NPO status , Patient's Chart, lab work & pertinent test results, reviewed documented beta blocker date and time   Airway Mallampati: III TM Distance: >3 FB Neck ROM: Limited    Dental  (+) Teeth Intact, Dental Advisory Given   Pulmonary sleep apnea ,  breath sounds clear to auscultation        Cardiovascular + dysrhythmias Rhythm:regular     Neuro/Psych  Neuromuscular disease negative psych ROS   GI/Hepatic Neg liver ROS, GERD-  Medicated and Controlled,  Endo/Other  negative endocrine ROS  Renal/GU negative Renal ROS  negative genitourinary   Musculoskeletal   Abdominal   Peds  Hematology negative hematology ROS (+)   Anesthesia Other Findings See surgeon's H&P   Reproductive/Obstetrics negative OB ROS                          Anesthesia Physical Anesthesia Plan  ASA: II  Anesthesia Plan: General   Post-op Pain Management:    Induction: Intravenous  Airway Management Planned: Oral ETT and Video Laryngoscope Planned  Additional Equipment:   Intra-op Plan:   Post-operative Plan: Extubation in OR  Informed Consent: I have reviewed the patients History and Physical, chart, labs and discussed the procedure including the risks, benefits and alternatives for the proposed anesthesia with the patient or authorized representative who has indicated his/her understanding and acceptance.   Dental Advisory Given and Dental advisory given  Plan Discussed with: CRNA, Surgeon and Anesthesiologist  Anesthesia Plan Comments:        Anesthesia Quick Evaluation

## 2013-10-05 MED ORDER — PANTOPRAZOLE SODIUM 40 MG PO TBEC
40.0000 mg | DELAYED_RELEASE_TABLET | Freq: Every day | ORAL | Status: DC
Start: 2013-10-05 — End: 2013-10-07
  Administered 2013-10-05 – 2013-10-07 (×3): 40 mg via ORAL
  Filled 2013-10-05 (×3): qty 1

## 2013-10-05 NOTE — Op Note (Signed)
Brandon Gibson, Brandon Gibson           ACCOUNT NO.:  [id number]  MEDICAL RECORD NO.:  [id number]  LOCATION:  [id number]                        FACILITY:  [facility]  PHYSICIAN:  Leeroy Cha, M.D.   DATE OF BIRTH:  1950-11-13  DATE OF PROCEDURE:  10/04/2013 DATE OF DISCHARGE:                              OPERATIVE REPORT   PREOPERATIVE DIAGNOSIS:  L2-L3 lumbar stenosis with neurogenic claudication.  POSTOPERATIVE DIAGNOSIS:  L2-L3 lumbar stenosis with neurogenic claudication.  PROCEDURES:  Bilateral L2-L3 laminectomy, decompression of the thecal sac, foraminotomy, posterolateral arthrodesis with autograft. Microscope.  SURGEON:  Leeroy Cha, M.D.  ASSISTANT:  Eustace Moore, MD  CLINICAL HISTORY:  This gentleman had been complaining of back pain radiation to both legs up to the point, then walking had been quite difficulty having to set for after 200-300 feet.  The patient previously has surgery in the lumbar spine at the level of 4-5.  The myelogram showed severe stenosis at the level of 2-3.  Surgery was advised.  He knew the risk and benefit of the surgery.  DESCRIPTION OF PROCEDURE:  The patient was taken to the OR, and after intubation, he was positioned in a prone manner.  The back was cleaned with DuraPrep and drapes were applied.  X-rays showed that indeed we were at the, one clip was at the level of L1 and the other one at the level of 2.  The incision was increased and we were able to dissect the L2 spinous process and lamina as well as the L3 spinous process and lamina.  Retraction was made all the way laterally until we reached the edge of the facet.  Then with the [place], we removed the spinous process of L2, L3, and with the drill, we did laminectomy of L2-3.  With the help of the microscope, we removed thick yellow calcified ligament with decompression of the thecal sac as well as the L2 and L3 nerve root.  At the end, we had plenty of space for the thecal sac.  With  the drill, we removed the periosteum at the lateral aspect of the 2-3 facet and autograft, which was saved from the laminectomy, was used for arthrodesis.  The area was irrigated.  Another x-ray showed plenty of space.  Then, drain was left in the epidural space and the wound was closed with Vicryl and Steri-Strip.          ______________________________ Leeroy Cha, M.D.     EB/MEDQ  D:  10/04/2013  T:  10/05/2013  Job:  [id number]

## 2013-10-05 NOTE — Evaluation (Signed)
Occupational Therapy Evaluation Patient Details Name: Brandon Gibson MRN: [id number] DOB: [birth year] Today's Date: 10/05/2013    History of Present Illness Pt s/p Bilateral L2-L3 laminectomy   Clinical Impression   Pt s/p bilateral L2-L3 lami.  Pt plans to live with significant other during recovery since her house is more accessible.  Will benefit from acute OT services to address below problem list. Recommending 3n1 and RW for home DME.    Follow Up Recommendations  No OT follow up;Supervision/Assistance - 24 hour    Equipment Recommendations  3 in 1 bedside comode (RW)    Recommendations for Other Services       Precautions / Restrictions Precautions Precautions: Back Precaution Comments: Educated pt on 3/3 back precautions.      Mobility Bed Mobility Overal bed mobility: Needs Assistance Bed Mobility: Rolling;Sidelying to Sit Rolling: Min guard Sidelying to sit: Min assist       General bed mobility comments: VCs for technique and sequencing.  Transfers Overall transfer level: Needs assistance Equipment used: Rolling walker (2 wheeled) Transfers: Sit to/from Stand Sit to Stand: Min assist;From elevated surface         General transfer comment: Assist to power up. VCs for hand placement.    Balance                                            ADL Overall ADL's : Needs assistance/impaired Eating/Feeding: Independent       Upper Body Bathing: Supervision/ safety;Sitting   Lower Body Bathing: Moderate assistance;Sit to/from stand   Upper Body Dressing : Minimal assistance;Sitting   Lower Body Dressing: Moderate assistance;Sit to/from stand Lower Body Dressing Details (indicate cue type and reason): unable to cross ankles over knees Toilet Transfer: Minimal assistance;Ambulation           Functional mobility during ADLs: Minimal assistance;Rolling walker General ADL Comments: Pt requiring incr time for all tasks due to pain  (first time OOB).  Very motivated to work with therapy.      Vision                     Perception     Praxis      Pertinent Vitals/Pain 6/10 back pain     Hand Dominance     Extremity/Trunk Assessment Upper Extremity Assessment Upper Extremity Assessment: Overall WFL for tasks assessed           Communication Communication Communication: No difficulties   Cognition Arousal/Alertness: Awake/alert Behavior During Therapy: WFL for tasks assessed/performed Overall Cognitive Status: Within Functional Limits for tasks assessed                     General Comments       Exercises       Shoulder Instructions      Home Living Family/patient expects to be discharged to:: Private residence Living Arrangements: Spouse/significant other Available Help at Discharge: Family Type of Home: House Home Access: Stairs to enter Technical brewer of Steps: 3 Entrance Stairs-Rails: None Home Layout: One level     Bathroom Shower/Tub: Teacher, early years/pre: Standard     Home Equipment: Tub bench   Additional Comments: Pt is moving in with his girlfriend while he is recovering since she has a one level house.  Pt reports his girlfriend has a tub bench he  can use.       Prior Functioning/Environment Level of Independence: Independent             OT Diagnosis: Generalized weakness;Acute pain   OT Problem List: Decreased strength;Decreased activity tolerance;Impaired balance (sitting and/or standing);Decreased knowledge of use of DME or AE;Decreased knowledge of precautions;Pain   OT Treatment/Interventions: Self-care/ADL training;DME and/or AE instruction;Therapeutic activities;Balance training;Patient/family education    OT Goals(Current goals can be found in the care plan section) Acute Rehab OT Goals Patient Stated Goal: to be independent OT Goal Formulation: With patient Time For Goal Achievement: 10/19/13 Potential to Achieve  Goals: Good  OT Frequency: Min 2X/week   Barriers to D/C:            Co-evaluation              End of Session Equipment Utilized During Treatment: Gait belt;Rolling walker Nurse Communication: Mobility status  Activity Tolerance: Patient tolerated treatment well Patient left: in chair;with call bell/phone within reach;with chair alarm set   Time: 1003-1041 OT Time Calculation (min): 38 min Charges:  OT General Charges $OT Visit: 1 Procedure OT Evaluation $Initial OT Evaluation Tier I: 1 Procedure OT Treatments $Self Care/Home Management : 8-22 mins $Therapeutic Activity: 8-22 mins G-Codes:    Luther Bradley 10/19/13, 11:01 AM  2013/10/19 Luther Bradley OTR/L Pager [phone] Office [phone]

## 2013-10-05 NOTE — Progress Notes (Signed)
Subjective: Patient reports Comfortable post op day one. Has not been out of bed yet. PT to ambulate. Has had moderate output from drain. On PCA. Motivated to go home  Objective: Vital signs in last 24 hours: Temp:  [98 F (36.7 C)-98.9 F (37.2 C)] 98.6 F (37 C) (06/06 0544) Pulse Rate:  [41-84] 69 (06/06 0544) Resp:  [7-30] 18 (06/06 0544) BP: (124-156)/(58-97) 129/72 mmHg (06/06 0544) SpO2:  [93 %-98 %] 97 % (06/06 0544) Weight:  [108.863 kg (240 lb)] 108.863 kg (240 lb) (06/05 1606)  Intake/Output from previous day: 06/05 0701 - 06/06 0700 In: 2099.8 [P.O.:100; I.V.:1899.8; IV Piggyback:100] Out: 1610 [Urine:1080; Drains:125] Intake/Output this shift:    Dressing with moderate bleed through. Drain with significant output. Motor function good to confrontation in lower ext.  Lab Results: No results found for this basename: WBC, HGB, HCT, PLT,  in the last 72 hours BMET No results found for this basename: NA, K, CL, CO2, GLUCOSE, BUN, CREATININE, CALCIUM,  in the last 72 hours  Studies/Results: Dg Lumbar Spine 2-3 Views  10/04/2013   CLINICAL DATA:  Lumbar stenosis L2-3, operative localization  EXAM: LUMBAR SPINE - 2-3 VIEW  COMPARISON:  09/11/2013  FINDINGS: First film demonstrate surgical instruments and radiopaque gauze posterior to L2.  Second film demonstrates a surgical retractor centered at L3. Surgical instruments are also noted posterior to L2-3 and L3-4.  Normal lumbar spine alignment. Diffuse lumbar degenerative changes and facet arthropathy.  IMPRESSION: Operative localization as above   Electronically Signed   By: Daryll Brod M.D.   On: 10/04/2013 14:07    Assessment/Plan: Mobilize today, Leave drain in for now. Wean PCA, If improving may be ready for DC tomorrow.   LOS: 1 day  WEan PCA, Mobilize   Kristeen Miss 10/05/2013, 9:31 AM

## 2013-10-05 NOTE — Evaluation (Signed)
Physical Therapy Evaluation Patient Details Name: Brandon Gibson MRN: [id number] DOB: October 30, 1950 Today's Date: 10/05/2013   History of Present Illness  Pt s/p Bilateral L2-L3 laminectomy  Clinical Impression  Patient is s/p surgery listed above resulting in the deficits listed below (see PT Problem List). Patient will benefit from skilled PT to increase their independence and safety with mobility (while adhering to their precautions) to allow discharge home with girlfriend. Patient needs to practice stairs next session.        Follow Up Recommendations No PT follow up;Supervision - Intermittent    Equipment Recommendations  Rolling walker with 5" wheels    Recommendations for Other Services       Precautions / Restrictions Precautions Precautions: Back Precaution Comments: pt able to recall 3/3 back precautions  Restrictions Weight Bearing Restrictions: No      Mobility  Bed Mobility Overal bed mobility: Needs Assistance Bed Mobility: Rolling;Sidelying to Sit Rolling: Min guard Sidelying to sit: Min assist       General bed mobility comments: verbally reviewed log rolling technique; pt in chair and returned to chair   Transfers Overall transfer level: Needs assistance Equipment used: Rolling walker (2 wheeled) Transfers: Sit to/from Stand Sit to Stand: Min guard         General transfer comment: min guard to steady; cues for hand placement and sequencing with RW   Ambulation/Gait Ambulation/Gait assistance: Min guard Ambulation Distance (Feet): 300 Feet Assistive device: Rolling walker (2 wheeled) Gait Pattern/deviations: Step-through pattern;Decreased stride length Gait velocity: decreased due to pain  Gait velocity interpretation: Below normal speed for age/gender General Gait Details: initially ambulating with RW; progressed to ambulating without AD; pt with shorter strides without RW; cues for gt sequencing and min guard to steady; pt guarded due to  pain   Stairs            Wheelchair Mobility    Modified Rankin (Stroke Patients Only)       Balance Overall balance assessment: Needs assistance Sitting-balance support: Feet supported;No upper extremity supported Sitting balance-Leahy Scale: Good     Standing balance support: During functional activity;Bilateral upper extremity supported;No upper extremity supported Standing balance-Leahy Scale: Fair                               Pertinent Vitals/Pain 8/10 with activity; patient repositioned for comfort     Home Living Family/patient expects to be discharged to:: Private residence Living Arrangements: Spouse/significant other Available Help at Discharge: Friend(s);Available 24 hours/day Type of Home: House Home Access: Stairs to enter Entrance Stairs-Rails: None Entrance Stairs-Number of Steps: 3 Home Layout: One level Home Equipment: Tub bench Additional Comments: Pt is moving in with his girlfriend while he is recovering since she has a one level house.  Pt reports his girlfriend has a tub bench he can use.     Prior Function Level of Independence: Independent               Hand Dominance   Dominant Hand: Right    Extremity/Trunk Assessment   Upper Extremity Assessment: Defer to OT evaluation           Lower Extremity Assessment: Generalized weakness      Cervical / Trunk Assessment: Normal  Communication   Communication: No difficulties  Cognition Arousal/Alertness: Awake/alert Behavior During Therapy: WFL for tasks assessed/performed Overall Cognitive Status: Within Functional Limits for tasks assessed  General Comments      Exercises        Assessment/Plan    PT Assessment Patient needs continued PT services  PT Diagnosis Abnormality of gait;Generalized weakness;Acute pain   PT Problem List Decreased strength;Decreased activity tolerance;Decreased balance;Decreased mobility;Pain   PT Treatment Interventions DME instruction;Gait training;Stair training;Therapeutic activities;Therapeutic exercise;Functional mobility training;Balance training;Neuromuscular re-education;Patient/family education   PT Goals (Current goals can be found in the Care Plan section) Acute Rehab PT Goals Patient Stated Goal: to be independent PT Goal Formulation: With patient Potential to Achieve Goals: Good    Frequency Min 5X/week   Barriers to discharge        Co-evaluation               End of Session Equipment Utilized During Treatment: Gait belt Activity Tolerance: Patient tolerated treatment well Patient left: in chair;with call bell/phone within reach Nurse Communication: Mobility status         Time: 3546-5681 PT Time Calculation (min): 15 min   Charges:   PT Evaluation $Initial PT Evaluation Tier I: 1 Procedure PT Treatments $Gait Training: 8-22 mins   PT G CodesKennis Carina [place], [place]  [phone] 10/05/2013, 12:53 PM

## 2013-10-06 MED ORDER — BISACODYL 10 MG RE SUPP
10.0000 mg | Freq: Every day | RECTAL | Status: DC | PRN
  Administered 2013-10-06: 10 mg via RECTAL
  Filled 2013-10-06: qty 1

## 2013-10-06 MED ORDER — SENNOSIDES-DOCUSATE SODIUM 8.6-50 MG PO TABS
1.0000 | ORAL_TABLET | Freq: Two times a day (BID) | ORAL | Status: DC
  Administered 2013-10-06 – 2013-10-07 (×3): 1 via ORAL
  Filled 2013-10-06 (×3): qty 1

## 2013-10-06 MED ORDER — OXYCODONE-ACETAMINOPHEN 5-325 MG PO TABS: 1.0000 | ORAL_TABLET | ORAL | Status: DC | PRN

## 2013-10-06 MED ORDER — FLEET ENEMA 7-19 GM/118ML RE ENEM
1.0000 | ENEMA | Freq: Once | RECTAL | Status: AC
  Administered 2013-10-06: 1 via RECTAL
  Filled 2013-10-06: qty 1

## 2013-10-06 NOTE — Care Management Note (Signed)
    Page 1 of 1   10/06/2013     4:20:51 PM CARE MANAGEMENT NOTE 10/06/2013  Patient:  KENNAN, DETTER   Account Number:  [id number]  Date Initiated:  10/06/2013  Documentation initiated by:  [facility]  Subjective/Objective Assessment:   adm: lumbar pain; Bilateral L2-L3 laminectomy, decompression of the thecal  sac, foraminotomy, posterolateral arthrodesis with autograft.  Microscope.     Action/Plan:   discharge planning   Anticipated DC Date:  10/06/2013   Anticipated DC Plan:  [place]  CM consult      Choice offered to / List presented to:     DME arranged  3-N-1  [place]      DME agency  [facility]        Status of service:  Completed, signed off Medicare Important Message given?   (If response is "NO", the following Medicare IM given date fields will be blank) Date Medicare IM given:   Date Additional Medicare IM given:    Discharge Disposition:  HOME/SELF CARE  Per UR Regulation:    If discussed at Long Length of Stay Meetings, dates discussed:    Comments:  10/06/13 08:30 RN requested DME be arranged for pt.  Pt's insurance is [employer].  [employer] called with request for 3n1 and rolling walker.  [employer] requested Cm fax facesheet, orders, H&P, PT/OT evals, OP note, Last progress note to [phone].  CM faxed requested information.  DME to be delivered to room prior to discharge. No follow up PT/OT recc.  No other CM needs were communicated.  Mariane Masters, BSN, CM [phone].

## 2013-10-06 NOTE — Progress Notes (Signed)
Physical Therapy Treatment Patient Details Name: deakin lacek MRN: [id number] DOB: 10-11-1950 Today's Date: 10/06/2013    History of Present Illness Pt s/p Bilateral L2-L3 laminectomy    PT Comments    Pt at mod I for mobility. Session focused on navigating steps and educating on back precautions. Pt concerned due to lack of BM. No further acute PT needs warranted. Pt encouraged to ambulate as tolerated around unit.   Follow Up Recommendations  No PT follow up;Supervision - Intermittent     Equipment Recommendations  Other (comment) (pt has RW at home)    Recommendations for Other Services       Precautions / Restrictions Precautions Precautions: Back Precaution Comments: pt able to recall 3/3 back precautions  Restrictions Weight Bearing Restrictions: No    Mobility  Bed Mobility Overal bed mobility: Needs Assistance Bed Mobility: Rolling;Sidelying to Sit;Sit to Sidelying Rolling: Supervision Sidelying to sit: Supervision     Sit to sidelying: Supervision General bed mobility comments: pt concerned about bed mobility; practiced log rolling technique with min cues for sequencing   Transfers Overall transfer level: Modified independent Equipment used: None Transfers: Sit to/from Stand Sit to Stand: Modified independent (Device/Increase time)         General transfer comment: incr time due to pain; no physical (A) needed  Ambulation/Gait Ambulation/Gait assistance: Modified independent (Device/Increase time) Ambulation Distance (Feet): 800 Feet Assistive device: None Gait Pattern/deviations: Step-through pattern;Decreased stride length Gait velocity: guarded due to pain  Gait velocity interpretation: Below normal speed for age/gender General Gait Details: pt ambulating without AD;  demo good balance and safety awareness with mobility    Stairs Stairs: Yes Stairs assistance: Min guard Stair Management: No rails;Step to pattern;Forwards Number of  Stairs: 3 General stair comments: handheld (A) due to lack of railing; cues for sequencing and technique   Wheelchair Mobility    Modified Rankin (Stroke Patients Only)       Balance Overall balance assessment: Modified Independent;No apparent balance deficits (not formally assessed)                                  Cognition Arousal/Alertness: Awake/alert Behavior During Therapy: WFL for tasks assessed/performed Overall Cognitive Status: Within Functional Limits for tasks assessed                      Exercises      General Comments General comments (skin integrity, edema, etc.): pt with multiple questions regarding back precautions and ADLs; pt educated and given instructions      Pertinent Vitals/Pain 5-6/10; patient repositioned for comfort     Home Living                      Prior Function            PT Goals (current goals can now be found in the care plan section) Acute Rehab PT Goals Patient Stated Goal: to be independent PT Goal Formulation: With patient Time For Goal Achievement: 10/06/13 Potential to Achieve Goals: Good Progress towards PT goals: Goals met/education completed, patient discharged from PT    Frequency  Min 5X/week    PT Plan Current plan remains appropriate    Co-evaluation             End of Session   Activity Tolerance: Patient tolerated treatment well Patient left: in bed;with call bell/phone within reach  Time: 6438-3779 PT Time Calculation (min): 26 min  Charges:  $Gait Training: 23-37 mins                    G Codes:      [place], [place]  [phone] 10/06/2013, 10:04 AM

## 2013-10-06 NOTE — Progress Notes (Addendum)
Patient has abd. Distention;hypoactive bowel sounds this morning; now active bowel sounds; some report of nausea; has had a dulcolax suppository with small results; has taken a fleets enema with no results; abd. Somewhat softer to touch now; is passing flatus; wants to wait til the am to discharge home;needs help to the bathroom; has a decreased appetite; have ordered a softer diet; patient is ambulating well in the hallways; has not vomited; no fever; will notify Dr.Jones he is not ready for discharge home today.

## 2013-10-06 NOTE — Progress Notes (Signed)
Occupational Therapy Treatment Patient Details Name: Brandon Gibson biljon MRN: [id number] DOB: 05-13-1950 Today's Date: 10/06/2013    History of present illness Pt s/p Bilateral L2-L3 laminectomy; microdiscectomy   OT comments  Completed education regarding ADL, compensatory techniques, DME and AE. Pt stated that he feels depressed as he is not able to do the things that he once enjoyed. Feel pt will benefit from outpt therapy with focus on strengthening pt to help him achieve goals of returning to activities he enjoys. OT signing off.   Follow Up Recommendations  No OT follow up;Supervision - Intermittent    Equipment Recommendations  3 in 1 bedside comode    Recommendations for Other Services      Precautions / Restrictions Precautions Precautions: Back Precaution Booklet Issued: Yes (comment) Precaution Comments: pt able to recall 3/3 back precautions  Restrictions Weight Bearing Restrictions: No       Mobility Bed Mobility Overal bed mobility: Modified Independent Bed Mobility: Rolling;Sidelying to Sit;Sit to Sidelying Rolling: Supervision Sidelying to sit: Supervision     Sit to sidelying: Supervision General bed mobility comments: pt concerned about bed mobility; practiced log rolling technique with min cues for sequencing   Transfers Overall transfer level: Modified independent Equipment used: None Transfers: Sit to/from Stand Sit to Stand: Modified independent (Device/Increase time)         General transfer comment: incr time due to pain; no physical (A) needed    Balance Overall balance assessment: Modified Independent                                 ADL                                         General ADL Comments: Educated pt on use of AE to increase independence with LB ADL. Also educated on use of toilet tongs/flushable wipes for hygiene after toileting. Educated on home set up and decreasing burden of care on  caregiver. Pt stated that he was very depressed about not being able to do the things that he enjoys.      Vision                     Perception     Praxis      Cognition   Behavior During Therapy: WFL for tasks assessed/performed Overall Cognitive Status: Within Functional Limits for tasks assessed                       Extremity/Trunk Assessment               Exercises     Shoulder Instructions       General Comments      Pertinent Vitals/ Pain       VSS; no c.o pain  Home Living                                          Prior Functioning/Environment              Frequency Min 2X/week     Progress Toward Goals  OT Goals(current goals can now be found in the care plan section)  Progress towards OT goals: Progressing  toward goals  Acute Rehab OT Goals Patient Stated Goal: to be independent OT Goal Formulation: With patient Time For Goal Achievement: 10/19/13 Potential to Achieve Goals: Good ADL Goals Pt Will Perform Grooming: with supervision;standing Pt Will Perform Lower Body Bathing: with supervision;with adaptive equipment;sit to/from stand Pt Will Perform Lower Body Dressing: with supervision;with adaptive equipment;sit to/from stand Pt Will Transfer to Toilet: with supervision;ambulating Pt Will Perform Toileting - Clothing Manipulation and hygiene: with supervision;sit to/from stand Pt Will Perform Tub/Shower Transfer: Tub transfer;with min guard assist;tub bench;rolling walker Additional ADL Goal #1: Pt will perform bed mobility at supervision level as precursor for EOB ADLs.  Plan Discharge plan remains appropriate    Co-evaluation                 End of Session     Activity Tolerance Patient tolerated treatment well   Patient Left in bed;with call bell/phone within reach   Nurse Communication Mobility status        Time: 8657-8469 OT Time Calculation (min): 22 min  Charges: OT  General Charges $OT Visit: 1 Procedure OT Treatments $Self Care/Home Management : 23-37 mins  Roney Jaffe Aubrynn [place] 10/06/2013, 12:12 PM   [facility], OTR/L  [phone] 10/06/2013

## 2013-10-06 NOTE — Progress Notes (Signed)
Patient ID: Brandon Gibson biljon, male   DOB: Feb 13, 1951, 63 y.o.   MRN: [id number] Subjective: Patient reports some headache. Back very sore. No leg pain. "legs are wonderful." C/o constipation. Some flatus.  Objective: Vital signs in last 24 hours: Temp:  [97.5 F (36.4 C)-98.9 F (37.2 C)] 98.1 F (36.7 C) (06/07 0623) Pulse Rate:  [41-85] 41 (06/07 0623) Resp:  [18] 18 (06/07 0623) BP: (104-147)/(44-82) 147/75 mmHg (06/07 0623) SpO2:  [95 %-100 %] 100 % (06/07 0623)  Intake/Output from previous day: 06/06 0701 - 06/07 0700 In: -  Out: 85 [Drains:85] Intake/Output this shift:    Neurologic: Grossly normal, belly some what distended, non-tender, moves legs well.   Lab Results: Lab Results  Component Value Date   WBC 5.9 10/01/2013   HGB 15.3 10/01/2013   HCT 43.0 10/01/2013   MCV 89.2 10/01/2013   PLT 160 10/01/2013   No results found for this basename: INR, PROTIME   BMET Lab Results  Component Value Date   NA 142 10/01/2013   K 3.9 10/01/2013   CL 103 10/01/2013   CO2 26 10/01/2013   GLUCOSE 95 10/01/2013   BUN 15 10/01/2013   CREATININE 0.91 10/01/2013   CALCIUM 9.5 10/01/2013    Studies/Results: Dg Lumbar Spine 2-3 Views  10/04/2013   CLINICAL DATA:  Lumbar stenosis L2-3, operative localization  EXAM: LUMBAR SPINE - 2-3 VIEW  COMPARISON:  09/11/2013  FINDINGS: First film demonstrate surgical instruments and radiopaque gauze posterior to L2.  Second film demonstrates a surgical retractor centered at L3. Surgical instruments are also noted posterior to L2-3 and L3-4.  Normal lumbar spine alignment. Diffuse lumbar degenerative changes and facet arthropathy.  IMPRESSION: Operative localization as above   Electronically Signed   By: Daryll Brod M.D.   On: 10/04/2013 14:07    Assessment/Plan: Brandon Gibson for D/C. Home if flatus and BM.   LOS: 2 days    Eustace Moore 10/06/2013, 9:40 AM

## 2013-10-07 NOTE — Discharge Summary (Signed)
Physician Discharge Summary  Patient ID: koty anctil MRN: [id number] DOB/AGE: 1950-09-23 63 y.o.  Admit date: 10/04/2013 Discharge date: 10/07/2013  Admission Diagnoses:lumbar stenosis  Discharge Diagnoses:  Active Problems:   Lumbar stenosis with neurogenic claudication   Discharged Condition: no leg pain  Hospital Course: surgery  Consults  None   Significant Diagnostic Studies: myelogram  Treatments: [email or web address]  Discharge Exam: Blood pressure 152/92, pulse 42, temperature 98.8 F (37.1 C), temperature source Oral, resp. rate 18, height 5\' 10"  (1.778 m), weight 240 lb (108.863 kg), SpO2 96.00%. ambulating Disposition: 01-Home or Self Care  Discharge Instructions   Call MD for:  difficulty breathing, headache or visual disturbances    Complete by:  As directed      Call MD for:  persistant nausea and vomiting    Complete by:  As directed      Call MD for:  redness, tenderness, or signs of infection (pain, swelling, redness, odor or green/yellow discharge around incision site)    Complete by:  As directed      Call MD for:  severe uncontrolled pain    Complete by:  As directed      Call MD for:  temperature >100.4    Complete by:  As directed      Diet - low sodium heart healthy    Complete by:  As directed      Discharge instructions    Complete by:  As directed   No bending or twisting, no heavy lifting, no driving     Increase activity slowly    Complete by:  As directed             Medication List         amLODipine 5 MG tablet  Commonly known as:  NORVASC  Take 5 mg by mouth daily.     aspirin 81 MG chewable tablet  Chew 81 mg by mouth daily.     hydrochlorothiazide 25 MG tablet  Commonly known as:  HYDRODIURIL  Take 25 mg by mouth daily.     lovastatin 20 MG tablet  Commonly known as:  MEVACOR  Take 20 mg by mouth at bedtime.     methylcellulose 1 % ophthalmic solution  Commonly known as:  ARTIFICIAL TEARS  Place 1 drop into both  eyes as needed (dry eyes).     niacin 500 MG tablet  Take 500 mg by mouth at bedtime.     omeprazole 40 MG capsule  Commonly known as:  PRILOSEC  Take 40 mg by mouth daily.     oxyCODONE-acetaminophen 5-325 MG per tablet  Commonly known as:  PERCOCET/ROXICET  Take 1-2 tablets by mouth every 4 (four) hours as needed for moderate pain.     potassium chloride 10 MEQ tablet  Commonly known as:  K-DUR  Take 10 mEq by mouth daily.     sucralfate 1 G tablet  Commonly known as:  CARAFATE  Take 1 g by mouth 4 (four) times daily -  with meals and at bedtime.           Follow-up Information   Follow up with Floyce Stakes, MD In 2 weeks.   Specialty:  Neurosurgery   Contact information:   [place] [place] [address] [phone]       Signed: Floyce Stakes 10/07/2013, 10:49 AM

## 2013-10-07 NOTE — Progress Notes (Signed)
Ready for discharge home; discharge instructions given and reviewed; patient discharge home with a 3in1 commode chair delivered by [facility] on Sunday. No fever, denies nausea, passing gas; abd. Softer to palpate;bowel sounds active; ambulating independently; tolerated diet today.

## 2013-10-07 NOTE — Clinical Social Work Note (Signed)
Per chart review, pt to be discharged home with home health services. CSW signing off. Thank you for the referral.  Lubertha Sayres, MSW, [facility] Licensed Clinical Social Worker [phone] and [phone]

## 2013-10-08 ENCOUNTER — Encounter (HOSPITAL_COMMUNITY): Payer: Self-pay | Admitting: Neurosurgery

## 2013-10-23 ENCOUNTER — Observation Stay: Payer: Self-pay | Admitting: Internal Medicine

## 2013-10-23 LAB — CBC
HCT: 43.5 % (ref 40.0–52.0)
HGB: 14.8 g/dL (ref 13.0–18.0)
MCH: 29.9 pg (ref 26.0–34.0)
MCHC: 34 g/dL (ref 32.0–36.0)
MCV: 88 fL (ref 80–100)
PLATELETS: 203 10*3/uL (ref 150–440)
RBC: 4.94 10*6/uL (ref 4.40–5.90)
RDW: 13.6 % (ref 11.5–14.5)
WBC: 5.8 10*3/uL (ref 3.8–10.6)

## 2013-10-23 LAB — BASIC METABOLIC PANEL
Anion Gap: 7 (ref 7–16)
BUN: 15 mg/dL (ref 7–18)
Calcium, Total: 9.1 mg/dL (ref 8.5–10.1)
Chloride: 109 mmol/L — ABNORMAL HIGH (ref 98–107)
Co2: 26 mmol/L (ref 21–32)
Creatinine: 1.11 mg/dL (ref 0.60–1.30)
EGFR (African American): >60
EGFR (Non-African Amer.): >60
Glucose: 124 mg/dL — ABNORMAL HIGH (ref 65–99)
Osmolality: 285 (ref 275–301)
Potassium: 3.7 mmol/L (ref 3.5–5.1)
Sodium: 142 mmol/L (ref 136–145)

## 2013-10-23 LAB — MAGNESIUM: Magnesium: 2 mg/dL

## 2013-11-19 ENCOUNTER — Ambulatory Visit: Payer: Self-pay | Admitting: Internal Medicine

## 2013-11-30 ENCOUNTER — Ambulatory Visit: Payer: Self-pay | Admitting: Internal Medicine

## 2013-12-31 ENCOUNTER — Ambulatory Visit: Payer: Self-pay | Admitting: Internal Medicine

## 2014-02-14 ENCOUNTER — Ambulatory Visit: Payer: Self-pay | Admitting: Internal Medicine

## 2014-03-04 DIAGNOSIS — M5416 Radiculopathy, lumbar region: Secondary | ICD-10-CM | POA: Insufficient documentation

## 2014-08-22 DIAGNOSIS — I4891 Unspecified atrial fibrillation: Secondary | ICD-10-CM | POA: Insufficient documentation

## 2014-08-23 NOTE — Discharge Summary (Signed)
Dates of Admission and Diagnosis:  Date of Admission 23-Oct-2013   Date of Discharge 23-Oct-2013   Admitting Diagnosis Angioedema   Final Diagnosis 1. Angioedema 2. HTN    Chief Complaint/History of Present Illness CHIEF COMPLAINT: Tongue swelling.   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 in the morning. The patient had some trouble swallowing, but did not any shortness of breath or wheezing. Here in the Emergency Room, the patient has received a dose of IV Solu-Medrol, Benadryl and Pepcid. Swelling has improved some, but no significant change and is being admitted to the hospitalist service under observation.   The patient mentioned that he has not taken any new medications other than oxycodone and Valium since his spine surgery done two weeks prior. He has not taken any ACE inhibitors or ARBS. No new medications or foods. Apparently, he does not have any shortness of breath or dysphagia. Feels his mouth is full, speech is slurred. No chest pain, shortness of breath, nausea, vomiting, abdominal pain, diarrhea, dysuria, hematuria, or frequency.   Allergies:  Morphine: Anaphylaxis  Lisinopril: Swelling  Hydrocodone: Itching  General Ref:  24-Jun-15 06:55   Complement C4 ========== TEST NAME ==========  ========= RESULTS =========  = REFERENCE RANGE =  COMPLEMENT C4  Complement C4, Serum Complement C4, Serum            [   34 mg/dL Adult       ]              [address]            No: [phone]           [address] [place], [place] [address]           Lindon Romp, MD         [phone]   Result(s) reported on 24 Oct 2013 at 05:48AM.  Cardiology:  24-Jun-15 06:59   Ventricular Rate 74  Atrial Rate 74  P-R Interval 158  QRS Duration 90  QT 430   QTc 477  P Axis 57  R Axis -14  T Axis 67  ECG interpretation Sinus rhythm with frequent Premature ventricular complexes Cannot rule out Anterior infarct , age undetermined Abnormal ECG When compared with ECG of 04-Sep-2009 03:18, Premature ventricular complexes are now Present QRS axis Shifted right Minimal criteria for Anterior infarct are now Present Nonspecific T wave abnormality, improved in Lateral leads QT has shortened ----------unconfirmed---------- Confirmed by OVERREAD, NOT ([id number]), editor PEARSON, BARBARA ([id number]) on 10/24/2013 2:58:53 PM  Routine Chem:  24-Jun-15 06:55   Magnesium, Serum 2.0 (1.8-2.4 THERAPEUTIC RANGE: 4-7 mg/dL TOXIC: > 10 mg/dL  -----------------------)  Glucose, Serum  124  BUN 15  Creatinine (comp) 1.11  Sodium, Serum 142  Potassium, Serum 3.7  Chloride, Serum  109  CO2, Serum 26  Calcium (Total), Serum 9.1  Anion Gap 7  Osmolality (calc) 285  eGFR (African American) >60  eGFR (Non-African American) >60 (eGFR values <32m/min/1.73 m2 may be an indication of chronic kidney disease (CKD). Calculated eGFR is useful in patients with stable renal function. The eGFR calculation will not be reliable in acutely ill patients when serum creatinine is changing rapidly. It is not useful in  patients on dialysis. The eGFR  calculation may not be applicable to patients at the low and high extremes of body sizes, pregnant women, and vegetarians.)  Routine Hem:  24-Jun-15 06:55   WBC (CBC) 5.8  RBC (CBC) 4.94  Hemoglobin (CBC) 14.8  Hematocrit (CBC) 43.5  Platelet Count (CBC) 203 (Result(s) reported on 23 Oct 2013 at 07:33AM.)  MCV 88  MCH 29.9  MCHC 34.0  RDW 13.6   Pertinent Past History:  Pertinent Past History PAST MEDICAL HISTORY:  1.  Hypertension.  2.  Benign prostatic hypertrophy.  3.  Basal cell carcinoma.  4.  Hyperlipidemia.  5.  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   Hospital Course:  Hospital Course Admitted onto tele floor with  IV steroids, Benadryl. Tongue swelling responded well to treatment and pt has requested to be discharged home. I have stopped oxycodone as he has allergy to other narcotics and this is the possible cause of angioedema. His C4 and C1 inhibitor levels are pending.  Time spent on discharge 35 minutes   Condition on Discharge Stable   Code Status:  Code Status Full Code   PHYSICAL EXAM ON DISCHARGE:  Physical Exam:  HEENT pink conjunctivae, moist oral mucosa, No tongue selling   NECK supple  No masses  thyroid not tender   RESP normal resp effort  clear BS   CARD regular rate   DISCHARGE INSTRUCTIONS HOME MEDS:  Medication Reconciliation: Patient's Home Medications at Discharge:     Medication Instructions  aspirin enteric coated 81 mg oral delayed release tablet  1 tab(s) orally once a day (at bedtime)   omeprazole 40 mg oral delayed release capsule  1 cap(s) orally once a day   lovastatin 10 mg oral tablet  1 tab(s) orally once a day (at bedtime)   niacin 500 mg oral capsule, extended release  1 cap(s) orally once a day (at bedtime)   amlodipine 5 mg oral tablet  1 tab(s) orally once a day   hydrochlorothiazide 25 mg oral tablet  1 tab(s) orally once a day   metoprolol succinate 25 mg oral tablet, extended release  1 tab(s) orally once a day   prednisone 10 mg oral tablet  Start at 60 mg and taper by 10 mg daily until complete     Physician's Instructions:  Diet Low Sodium   Activity Limitations As tolerated   Return to Work Not Applicable   Time frame for Follow Up Appointment 1-2 weeks  PCP   Other Comments Stop taking your Oxycodone   Electronic Signatures: Sudini, Lottie Dawson (MD)  (Signed 25-Jun-15 16:39)  Authored: ADMISSION DATE AND DIAGNOSIS, CHIEF COMPLAINT/HPI, Allergies, PERTINENT LABS, PERTINENT PAST HISTORY, HOSPITAL COURSE, PHYSICAL EXAM ON DISCHARGE, DISCHARGE INSTRUCTIONS HOME MEDS, PATIENT INSTRUCTIONS   Last Updated: 25-Jun-15 16:39 by Alba Destine (MD)

## 2014-08-23 NOTE — H&P (Signed)
PATIENT NAME:  Brandon Gibson, Brandon Gibson MR#:  [id number] DATE OF BIRTH:  [birth year]  DATE OF ADMISSION:  10/23/2013  PRIMARY CARE PHYSICIAN:  Lamonte Sakai, M.D.  CHIEF COMPLAINT: Tongue swelling.   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 in the morning. The patient had some trouble swallowing, but did not any shortness of breath or wheezing. Here in the Emergency Room, the patient has received a dose of IV Solu-Medrol, Benadryl and Pepcid. Swelling has improved some, but no significant change and is being admitted to the hospitalist service under observation.   The patient mentioned that he has not taken any new medications other than oxycodone and Valium since his spine surgery done two weeks prior. He has not taken any ACE inhibitors or ARBS. No new medications or foods. Apparently, he does not have any shortness of breath or dysphagia. Feels his mouth is full, speech is slurred. No chest pain, shortness of breath, nausea, vomiting, abdominal pain, diarrhea, dysuria, hematuria, or frequency.   PAST MEDICAL HISTORY:  1.  Hypertension.  2.  Benign prostatic hypertrophy.  3.  Basal cell carcinoma.  4.  Hyperlipidemia.  5.  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   PAST SURGICAL HISTORY:  1.  Cervical fusion in 1988.  2.  Lumbar diskectomy.  3.  Right knee surgery as a child.  4.  Lasix surgery.   HOME MEDICATIONS:  2.  Aspirin 81 mg daily.  3.  Hydrochlorothiazide 25 mg daily.  4.  Atorvastatin 10 mg daily.  5.  Niacin 5 mg oral once a day.  6.  Omeprazole 40 mg oral once a day.   FAMILY HISTORY: Diabetes and angina.   ALLERGIES: MORPHINE, HYDROCODONE, AND LISINOPRIL.   REVIEW OF SYSTEMS:   CONSTITUTIONAL:  Complains of some fatigue.  EYES: No blurry vision or pain. EAR, NOSE, AND THROAT: No tinnitus or  pain.  He does have some tongue swelling.  NECK:  Supple with no thyromegaly or palpable lymph nodes. CARDIAC: No chest pain, orthopnea or edema.  RESPIRATORY: No shortness of breath or wheezing.  GASTROINTESTINAL: No nausea, vomiting, diarrhea, or abdominal pain.  GENITOURINARY:  No dysuria, hematuria, or frequency.  ENDOCRINE: No polyuria, nocturia, problems. HEMATOLOGIC AND LYMPHATIC: No anemia, easy bruising, or bleeding.  INTEGUMENTARY:  No acne, rash, or lesion.   PHYSICAL EXAMINATION:  VITAL SIGNS: Temperature 98.8, pulse of 96, blood pressure 136/74, saturating 95% on room air.  GENERAL: Obese, Caucasian male patient lying in bed, seems comfortable, compliant with the exam. PSYCHIATRIC: Alert and oriented x 3. Mood and affect appropriate. Judgment intact.  HEENT: Oral mucosa  moist and pink, has significant tongue swelling with slurred speech secondary to this.  NECK: Supple ,  trachea midline. No carotid bruit or JVD.  CARDIOVASCULAR: S1, S2, without any murmurs. Peripheral pulses 2+. No edema.  RESPIRATORY: Normal work of breathing. Clear to auscultation both sides. GASTROINTESTINAL:  Soft  abdomen, nontender. Bowel sounds present. No hepatosplenomegaly palpable.  SKIN: Warm and dry. No petechia, rash, or ulcer.  MUSCULOSKELETAL: No joint tenderness in large joints.  NEUROLOGICAL: Motor strength 5/5 upper and lower extremities. Sensation is intact all over. LYMPHATICS:  No cervical lymphadenopathy.   LABORATORY STUDIES: Glucose 124, BUN 15, creatinine 1.11. Sodium and potassium normal. WBC is 5.8, hemoglobin 14.8.   ASSESSMENT AND PLAN:  1.  Angioedema with significant tongue  swelling, mild dysphagia, but no shortness of breath.  We will start the patient on IV steroids, Benadryl and H2 blockers. Follow up patient clinically. If he develops any wheezing or shortness of breath or respiratory compromise, he may need intubation. Presently, hopefully, he will improve with this  treatment. The patient has been off lisinopril for the past four months since he had angioedema.  At this time, he is taking oxycodone, which can interact and have a common allergic reaction similar to morphine and hydrocodone, which is the possible cause.  We will hold  off on this medication. Continue his other medications and closely monitor patient.  2.  Hypertension. Continue medications.  3.  Hyperlipidemia. Continue medications.  4.  Deep venous thrombosis prophylaxis, Lovenox. 5.  CODE STATUS: FULL CODE.   TIME SPENT TODAY ON THIS CASE: 45 minutes.    ____________________________ Leia Alf Sudini, MD srs:ts D: 10/23/2013 14:32:37 ET T: 10/23/2013 15:57:12 ET JOB#: [id number]  cc: Alveta Heimlich R. Sudini, MD, <Dictator> Neita Carp MD ELECTRONICALLY SIGNED 11/12/2013 18:15

## 2015-06-24 ENCOUNTER — Other Ambulatory Visit: Payer: Self-pay | Admitting: Orthopedic Surgery

## 2015-06-24 DIAGNOSIS — M25562 Pain in left knee: Principal | ICD-10-CM

## 2015-06-24 DIAGNOSIS — M25561 Pain in right knee: Secondary | ICD-10-CM

## 2015-06-30 ENCOUNTER — Ambulatory Visit
Admission: RE | Admit: 2015-06-30 | Discharge: 2015-06-30 | Disposition: A | Discharge status: Home / Self Care | Payer: Medicare PPO | Source: Ambulatory Visit | Attending: Orthopedic Surgery | Admitting: Orthopedic Surgery

## 2015-06-30 DIAGNOSIS — M25562 Pain in left knee: Principal | ICD-10-CM

## 2015-06-30 DIAGNOSIS — M25561 Pain in right knee: Secondary | ICD-10-CM

## 2015-07-15 ENCOUNTER — Ambulatory Visit: Payer: Medicare PPO

## 2015-07-30 ENCOUNTER — Encounter
Admission: RE | Admit: 2015-07-30 | Discharge: 2015-07-30 | Disposition: A | Discharge status: Home / Self Care | Payer: Medicare PPO | Source: Ambulatory Visit | Attending: Orthopedic Surgery | Admitting: Orthopedic Surgery

## 2015-07-30 DIAGNOSIS — Z01812 Encounter for preprocedural laboratory examination: Secondary | ICD-10-CM | POA: Insufficient documentation

## 2015-07-30 HISTORY — DX: Type 2 diabetes mellitus without complications: E11.9

## 2015-07-30 LAB — POTASSIUM: Potassium: 4.2 mmol/L (ref 3.5–5.1)

## 2015-07-30 NOTE — Pre-Procedure Instructions (Addendum)
Lab report & EKG from [place]. Received frequent premature ventricular contractions results reported to Dr. Andree Elk, Pt swims 1 mile per day without chest pain or SOB, OK to proceed per Dr. Andree Elk.

## 2015-07-30 NOTE — Patient Instructions (Signed)
  Your procedure is scheduled [id number] April 10 , 2017. Report to Same Day Surgery. To find out your arrival time please call [phone] between 1PM - 3PM on Friday August 07, 2015.  Remember: Instructions that are not followed completely may result in serious medical risk, up to and including death, or upon the discretion of your surgeon and anesthesiologist your surgery may need to be rescheduled.    _x___ 1. Do not eat food or drink liquids after midnight. No gum chewing or hard candies.     _x___ 2. No Alcohol for 24 hours before or after surgery.   ____ 3. Bring all medications with you on the day of surgery if instructed.    __x__ 4. Notify your doctor if there is any change in your medical condition     (cold, fever, infections).     Do not wear jewelry, make-up, hairpins, clips or nail polish.  Do not wear lotions, powders, or perfumes. You may wear deodorant.  Do not shave 48 hours prior to surgery. Men may shave face and neck.  Do not bring valuables to the hospital.    [facility] is not responsible for any belongings or valuables.               Contacts, dentures or bridgework may not be worn into surgery.  Leave your suitcase in the car. After surgery it may be brought to your room.  For patients admitted to the hospital, discharge time is determined by your treatment team.   Patients discharged the day of surgery will not be allowed to drive home.    Please read over the following fact sheets that you were given:   [facility] Preparing for Surgery  __x__ Take these medicines the morning of surgery with A SIP OF WATER:    1. metoprolol succinate (TOPROL-XL)   2. omeprazole (PRILOSEC)   3. Hydralizine  ____ Fleet Enema (as directed)   __x__ Use CHG Soap as directed on instruction sheet  ____ Use inhalers on the day of surgery and bring to hospital day of surgery  _x__ Stop metformin 2 days prior to surgery    ____ Take 1/2 of usual insulin dose the night  before surgery and none on the morning of  surgery.   _x___ Stop aspirin 5 day prior to surgery and XARELTO 3 days prior to surgery.  _x___ Stop Anti-inflammatories such as Advil, Aleve, Ibuprofen, Motrin, Naproxen, Naprosyn, Goodies powders or aspirin products. Tylenol is OK to take for pain.   _x__ Stop supplements Niacin and Glucosamine until after surgery.    _x___ Bring C-Pap to the hospital.

## 2015-07-30 NOTE — Pre-Procedure Instructions (Signed)
Release of information faxed to [facility] for pt most recent EKG and lab results.

## 2015-08-10 ENCOUNTER — Encounter
Admission: RE | Disposition: A | Discharge status: Home / Self Care | Payer: Self-pay | Source: Ambulatory Visit | Attending: Orthopedic Surgery

## 2015-08-10 ENCOUNTER — Inpatient Hospital Stay: Payer: Medicare PPO | Admitting: Anesthesiology

## 2015-08-10 ENCOUNTER — Encounter: Payer: Self-pay | Admitting: *Deleted

## 2015-08-10 ENCOUNTER — Ambulatory Visit
Admission: RE | Admit: 2015-08-10 | Discharge: 2015-08-10 | Disposition: A | Discharge status: Home / Self Care | Payer: Medicare PPO | Source: Ambulatory Visit | Attending: Orthopedic Surgery | Admitting: Orthopedic Surgery

## 2015-08-10 DIAGNOSIS — Z8249 Family history of ischemic heart disease and other diseases of the circulatory system: Secondary | ICD-10-CM | POA: Insufficient documentation

## 2015-08-10 DIAGNOSIS — Z885 Allergy status to narcotic agent status: Secondary | ICD-10-CM | POA: Insufficient documentation

## 2015-08-10 DIAGNOSIS — E119 Type 2 diabetes mellitus without complications: Secondary | ICD-10-CM | POA: Insufficient documentation

## 2015-08-10 DIAGNOSIS — Z7982 Long term (current) use of aspirin: Secondary | ICD-10-CM | POA: Diagnosis not present

## 2015-08-10 DIAGNOSIS — M94262 Chondromalacia, left knee: Secondary | ICD-10-CM | POA: Insufficient documentation

## 2015-08-10 DIAGNOSIS — Z79899 Other long term (current) drug therapy: Secondary | ICD-10-CM | POA: Insufficient documentation

## 2015-08-10 DIAGNOSIS — E785 Hyperlipidemia, unspecified: Secondary | ICD-10-CM | POA: Insufficient documentation

## 2015-08-10 DIAGNOSIS — M23222 Derangement of posterior horn of medial meniscus due to old tear or injury, left knee: Secondary | ICD-10-CM | POA: Diagnosis present

## 2015-08-10 DIAGNOSIS — Z7901 Long term (current) use of anticoagulants: Secondary | ICD-10-CM | POA: Insufficient documentation

## 2015-08-10 DIAGNOSIS — Z7984 Long term (current) use of oral hypoglycemic drugs: Secondary | ICD-10-CM | POA: Insufficient documentation

## 2015-08-10 DIAGNOSIS — Z888 Allergy status to other drugs, medicaments and biological substances status: Secondary | ICD-10-CM | POA: Diagnosis not present

## 2015-08-10 DIAGNOSIS — M23212 Derangement of anterior horn of medial meniscus due to old tear or injury, left knee: Secondary | ICD-10-CM | POA: Diagnosis not present

## 2015-08-10 DIAGNOSIS — I10 Essential (primary) hypertension: Secondary | ICD-10-CM | POA: Diagnosis not present

## 2015-08-10 DIAGNOSIS — K219 Gastro-esophageal reflux disease without esophagitis: Secondary | ICD-10-CM | POA: Insufficient documentation

## 2015-08-10 DIAGNOSIS — Z833 Family history of diabetes mellitus: Secondary | ICD-10-CM | POA: Insufficient documentation

## 2015-08-10 HISTORY — PX: KNEE ARTHROSCOPY: SHX127

## 2015-08-10 LAB — GLUCOSE, CAPILLARY
GLUCOSE-CAPILLARY: 110 mg/dL — AB (ref 65–99)
GLUCOSE-CAPILLARY: 106 mg/dL — AB (ref 65–99)

## 2015-08-10 SURGERY — ARTHROSCOPY, KNEE
Anesthesia: General | Site: Knee | Laterality: Left | Wound class: Clean

## 2015-08-10 MED ORDER — FENTANYL CITRATE (PF) 100 MCG/2ML IJ SOLN
INTRAMUSCULAR | Status: AC
  Filled 2015-08-10: qty 2

## 2015-08-10 MED ORDER — ACETAMINOPHEN 10 MG/ML IV SOLN
INTRAVENOUS | Status: AC
  Filled 2015-08-10: qty 100

## 2015-08-10 MED ORDER — BUPIVACAINE-EPINEPHRINE 0.25% -1:200000 IJ SOLN
INTRAMUSCULAR | Status: DC | PRN
  Administered 2015-08-10: 5 mL

## 2015-08-10 MED ORDER — ONDANSETRON HCL 4 MG/2ML IJ SOLN: 4.0000 mg | Freq: Four times a day (QID) | INTRAMUSCULAR | Status: DC | PRN

## 2015-08-10 MED ORDER — FENTANYL CITRATE (PF) 100 MCG/2ML IJ SOLN
INTRAMUSCULAR | Status: DC | PRN
  Administered 2015-08-10 (×2): 50 ug via INTRAVENOUS

## 2015-08-10 MED ORDER — GLYCOPYRROLATE 0.2 MG/ML IJ SOLN
INTRAMUSCULAR | Status: DC | PRN
  Administered 2015-08-10: 0.2 mg via INTRAVENOUS

## 2015-08-10 MED ORDER — METOCLOPRAMIDE HCL 5 MG/ML IJ SOLN: 5.0000 mg | Freq: Three times a day (TID) | INTRAMUSCULAR | Status: DC | PRN

## 2015-08-10 MED ORDER — BUPIVACAINE-EPINEPHRINE (PF) 0.25% -1:200000 IJ SOLN
INTRAMUSCULAR | Status: AC
  Filled 2015-08-10: qty 30

## 2015-08-10 MED ORDER — LIDOCAINE HCL (CARDIAC) 20 MG/ML IV SOLN
INTRAVENOUS | Status: DC | PRN
  Administered 2015-08-10: 100 mg via INTRAVENOUS

## 2015-08-10 MED ORDER — OXYCODONE HCL 5 MG PO TABS: 5.0000 mg | ORAL_TABLET | ORAL | Status: DC | PRN

## 2015-08-10 MED ORDER — METOCLOPRAMIDE HCL 10 MG PO TABS: 5.0000 mg | ORAL_TABLET | Freq: Three times a day (TID) | ORAL | Status: DC | PRN

## 2015-08-10 MED ORDER — PROPOFOL 10 MG/ML IV BOLUS
INTRAVENOUS | Status: DC | PRN
  Administered 2015-08-10: 20 mg via INTRAVENOUS
  Administered 2015-08-10: 180 mg via INTRAVENOUS

## 2015-08-10 MED ORDER — ONDANSETRON HCL 4 MG/2ML IJ SOLN
INTRAMUSCULAR | Status: DC | PRN
  Administered 2015-08-10: 4 mg via INTRAVENOUS

## 2015-08-10 MED ORDER — FENTANYL CITRATE (PF) 100 MCG/2ML IJ SOLN
25.0000 ug | INTRAMUSCULAR | Status: AC | PRN
  Administered 2015-08-10 (×6): 25 ug via INTRAVENOUS

## 2015-08-10 MED ORDER — ONDANSETRON HCL 4 MG/2ML IJ SOLN: 4.0000 mg | Freq: Once | INTRAMUSCULAR | Status: DC | PRN

## 2015-08-10 MED ORDER — SODIUM CHLORIDE 0.9 % IV SOLN: INTRAVENOUS | Status: DC

## 2015-08-10 MED ORDER — SODIUM CHLORIDE 0.9 % IV SOLN
INTRAVENOUS | Status: DC
Start: 2015-08-10 — End: 2015-08-10
  Administered 2015-08-10: 15:00:00 via INTRAVENOUS

## 2015-08-10 MED ORDER — ACETAMINOPHEN 10 MG/ML IV SOLN
INTRAVENOUS | Status: DC | PRN
  Administered 2015-08-10: 1000 mg via INTRAVENOUS

## 2015-08-10 MED ORDER — MIDAZOLAM HCL 2 MG/2ML IJ SOLN
INTRAMUSCULAR | Status: DC | PRN
  Administered 2015-08-10: 2 mg via INTRAVENOUS

## 2015-08-10 MED ORDER — ONDANSETRON HCL 4 MG PO TABS: 4.0000 mg | ORAL_TABLET | Freq: Four times a day (QID) | ORAL | Status: DC | PRN

## 2015-08-10 SURGICAL SUPPLY — 21 items
BLADE SHAVER 4.5 DBL SERAT CV (CUTTER) ×3 IMPLANT
BNDG ESMARK 6X12 TAN STRL LF (GAUZE/BANDAGES/DRESSINGS) ×3 IMPLANT
DRSG DERMACEA 8X12 NADH (GAUZE/BANDAGES/DRESSINGS) ×3 IMPLANT
DURAPREP 26ML APPLICATOR (WOUND CARE) ×6 IMPLANT
GAUZE SPONGE 4X4 12PLY STRL (GAUZE/BANDAGES/DRESSINGS) ×3 IMPLANT
GLOVE BIOGEL M STRL SZ7.5 (GLOVE) ×3 IMPLANT
GLOVE INDICATOR 8.0 STRL GRN (GLOVE) ×3 IMPLANT
GOWN STRL REUS W/ TWL LRG LVL3 (GOWN DISPOSABLE) ×2 IMPLANT
GOWN STRL REUS W/TWL LRG LVL3 (GOWN DISPOSABLE) ×4
IV LACTATED RINGER IRRG 3000ML (IV SOLUTION) ×18
IV LR IRRIG 3000ML ARTHROMATIC (IV SOLUTION) ×6 IMPLANT
MANIFOLD NEPTUNE II (INSTRUMENTS) ×3 IMPLANT
PACK ARTHROSCOPY KNEE (MISCELLANEOUS) ×3 IMPLANT
SET TUBE SUCT SHAVER OUTFL 24K (TUBING) ×3 IMPLANT
SET TUBE TIP INTRA-ARTICULAR (MISCELLANEOUS) ×3 IMPLANT
STRAP SAFETY BODY (MISCELLANEOUS) ×3 IMPLANT
SUT ETHILON 3-0 FS-10 30 BLK (SUTURE) ×3
SUTURE EHLN 3-0 FS-10 30 BLK (SUTURE) ×1 IMPLANT
TUBING ARTHRO INFLOW-ONLY STRL (TUBING) ×3 IMPLANT
WAND HAND CNTRL MULTIVAC 50 (MISCELLANEOUS) ×3 IMPLANT
WRAP KNEE W/COLD PACKS 25.5X14 (SOFTGOODS) IMPLANT

## 2015-08-10 NOTE — Brief Op Note (Signed)
08/10/2015  5:11 PM  PATIENT:  Brandon Gibson  65 y.o. male  PRE-OPERATIVE DIAGNOSIS:  INTERNAL DERANGEMENT LEFT KNEE  POST-OPERATIVE DIAGNOSIS:  TEAR POSTERIOR AND ANTERIOR HORN MEDIAL MENISCUS, CHONDROMALACIA  - LEFT KNEE  PROCEDURE:  Procedure(s): LEFT KNEE ARTHROSCOPY, CHONDROPLASTY, MEDIAL MENISECTOMY (Left)  SURGEON:  Surgeon(s) and Role:    * Dereck Leep, MD - Primary  ASSISTANTS: none   ANESTHESIA:   general  EBL:  Total I/O In: 500 [I.V.:500] Out: -   BLOOD ADMINISTERED:none  DRAINS: none   LOCAL MEDICATIONS USED:  MARCAINE     SPECIMEN:  No Specimen  DISPOSITION OF SPECIMEN:  N/A  COUNTS:  YES  TOURNIQUET:   not used  DICTATION: .Sales executive  PLAN OF CARE: Discharge to home after PACU  PATIENT DISPOSITION:  PACU - hemodynamically stable.   Delay start of Pharmacological VTE agent (>24hrs) due to surgical blood loss or risk of bleeding: not applicable

## 2015-08-10 NOTE — Anesthesia Procedure Notes (Signed)
Procedure Name: LMA Insertion Performed by: Vlasta Baskin Pre-anesthesia Checklist: Patient identified, Emergency Drugs available, Suction available, Patient being monitored and Timeout performed Patient Re-evaluated:Patient Re-evaluated prior to inductionOxygen Delivery Method: Circle system utilized Preoxygenation: Pre-oxygenation with 100% oxygen Intubation Type: IV induction LMA: LMA inserted LMA Size: 4.0 Number of attempts: 1 Placement Confirmation: positive ETCO2 and breath sounds checked- equal and bilateral Tube secured with: Tape Dental Injury: Teeth and Oropharynx as per pre-operative assessment      

## 2015-08-10 NOTE — H&P (Signed)
The patient has been re-examined, and the chart reviewed, and there have been no interval changes to the documented history and physical.    The risks, benefits, and alternatives have been discussed at length. The patient expressed understanding of the risks benefits and agreed with plans for surgical intervention.  Nyheem Binette P. Arora Coakley, Jr. M.D.    

## 2015-08-10 NOTE — Anesthesia Preprocedure Evaluation (Addendum)
Anesthesia Evaluation  Patient identified by MRN, date of birth, ID band Patient awake    Reviewed: Allergy & Precautions, NPO status , Patient's Chart, lab work & pertinent test results, reviewed documented beta blocker date and time   Airway Mallampati: III  TM Distance: >3 FB     Dental  (+) Chipped, Missing   Pulmonary sleep apnea and Continuous Positive Airway Pressure Ventilation ,           Cardiovascular + dysrhythmias Atrial Fibrillation      Neuro/Psych    GI/Hepatic   Endo/Other  diabetes, Type 2  Renal/GU      Musculoskeletal  (+) Arthritis ,   Abdominal   Peds  Hematology   Anesthesia Other Findings Good neck movement. Will use CPAP tonite. Obese. Takes xarelto for Afib. No stents. 2 missing teeth.  Reproductive/Obstetrics                            Anesthesia Physical Anesthesia Plan  ASA: III  Anesthesia Plan: General   Post-op Pain Management:    Induction: Intravenous  Airway Management Planned: LMA  Additional Equipment:   Intra-op Plan:   Post-operative Plan:   Informed Consent: I have reviewed the patients History and Physical, chart, labs and discussed the procedure including the risks, benefits and alternatives for the proposed anesthesia with the patient or authorized representative who has indicated his/her understanding and acceptance.     Plan Discussed with: CRNA  Anesthesia Plan Comments:         Anesthesia Quick Evaluation

## 2015-08-10 NOTE — Discharge Instructions (Signed)
°  Instructions after Knee Arthroscopy  ° °- James P. Hooten, Jr., M.D.    ° Dept. of Orthopaedics & Sports Medicine ° [facility] ° [address] ° [place], [place]  [address] ° ° Phone: [phone]   Fax: [phone] ° ° °DIET: °• Drink plenty of non-alcoholic fluids & begin a light diet. °• Resume your normal diet the day after surgery. ° °ACTIVITY:  °• You may use crutches or a walker with weight-bearing as tolerated, unless instructed otherwise. °• You may wean yourself off of the walker or crutches as tolerated.  °• Begin doing gentle exercises. Exercising will reduce the pain and swelling, increase motion, and prevent muscle weakness.   °• Avoid strenuous activities or athletics for a minimum of 4-6 weeks after arthroscopic surgery. °• Do not drive or operate any equipment until instructed. ° °WOUND CARE:  °• Place one to two pillows under the knee the first day or two when sitting or lying.  °• Continue to use the ice packs periodically to reduce pain and swelling. °• The small incisions in your knee are closed with nylon stitches. The stitches will be removed in the office. °• The bulky dressing may be removed on the second day after surgery. DO NOT TOUCH THE STITCHES. Put a Band-Aid over each stitch. Do NOT use any ointments or creams on the incisions.  °• You may bathe or shower after the stitches are removed at the first office visit following surgery. ° °MEDICATIONS: °• You may resume your regular medications. °• Please take the pain medication as prescribed. °• Do not take pain medication on an empty stomach. °• Do not drive or drink alcoholic beverages when taking pain medications. ° °CALL THE OFFICE FOR: °• Temperature above 101 degrees °• Excessive bleeding or drainage on the dressing. °• Excessive swelling, coldness, or paleness of the toes. °• Persistent nausea and vomiting. ° °FOLLOW-UP:  °• You should have an appointment to return to the office in 7-10 days after surgery.   ° ° °AMBULATORY SURGERY  °DISCHARGE INSTRUCTIONS ° ° °1) The drugs that you were given will stay in your system until tomorrow so for the next 24 hours you should not: ° °A) Drive an automobile °B) Make any legal decisions °C) Drink any alcoholic beverage ° ° °2) You may resume regular meals tomorrow.  Today it is better to start with liquids and gradually work up to solid foods. ° °You may eat anything you prefer, but it is better to start with liquids, then soup and crackers, and gradually work up to solid foods. ° ° °3) Please notify your doctor immediately if you have any unusual bleeding, trouble breathing, redness and pain at the surgery site, drainage, fever, or pain not relieved by medication. ° ° ° °4) Additional Instructions: ° ° ° ° ° ° ° °Please contact your physician with any problems or Same Day Surgery at [phone], Monday through Friday 6 am to 4 pm, or [place] [facility] Main number at [phone]. ° °

## 2015-08-10 NOTE — Progress Notes (Signed)
Dr Marcello Moores in to see pt 230pm and aware pt had 5 sips water from 7am to noon.  He allowed pt to take a sip of water preop at 235 pm

## 2015-08-10 NOTE — Anesthesia Postprocedure Evaluation (Signed)
Anesthesia Post Note  Patient: Brandon Gibson Biljon  Procedure(s) Performed: Procedure(s) (LRB): LEFT KNEE ARTHROSCOPY, CHONDROPLASTY, MEDIAL MENISECTOMY (Left)  Patient location during evaluation: PACU Anesthesia Type: General Level of consciousness: awake and alert Pain management: pain level controlled Vital Signs Assessment: post-procedure vital signs reviewed and stable Respiratory status: spontaneous breathing and respiratory function stable Cardiovascular status: stable Anesthetic complications: no    Last Vitals:  Filed Vitals:   08/10/15 1709 08/10/15 1724  BP: 144/88 141/78  Pulse: 50 61  Temp: 36.7 C   Resp: 13 16    Last Pain:  Filed Vitals:   08/10/15 1725  PainSc: 4                  Alayia Meggison K

## 2015-08-10 NOTE — Transfer of Care (Signed)
Immediate Anesthesia Transfer of Care Note  Patient: Brandon Gibson  Procedure(s) Performed: Procedure(s): LEFT KNEE ARTHROSCOPY, CHONDROPLASTY, MEDIAL MENISECTOMY (Left)  Patient Location: PACU  Anesthesia Type:General  Level of Consciousness: awake and alert   Airway & Oxygen Therapy: Patient Spontanous Breathing and Patient connected to face mask oxygen  Post-op Assessment: Report given to RN  Post vital signs: Reviewed  Last Vitals:  Filed Vitals:   08/10/15 1354 08/10/15 1709  BP: 174/88 144/88  Pulse: 56 50  Temp: 36.8 C 36.7 C  Resp: 16 13    Complications: No apparent anesthesia complications

## 2015-08-10 NOTE — Progress Notes (Signed)
OR nurse, Mindy aware pt brought leg brace and will going back to OR with him

## 2015-08-10 NOTE — Op Note (Signed)
OPERATIVE NOTE  DATE OF SURGERY:  08/10/2015  PATIENT NAME:  Brandon Gibson   DOB: 1951-04-29  MRN: [id number]   PRE-OPERATIVE DIAGNOSIS:  Internal derangement of the left knee   POST-OPERATIVE DIAGNOSIS:   Tear of the anterior and posterior horn of the medial meniscus, left knee Grade 3 chondromalacia of the medial femoral condyle, left knee  PROCEDURE:  Left knee arthroscopy, partial medial meniscectomy, and chondroplasty  SURGEON:  Marciano Sequin., M.D.   ASSISTANT: none  ANESTHESIA: general  ESTIMATED BLOOD LOSS: Minimal  FLUIDS REPLACED: 500 mL of crystalloid  TOURNIQUET TIME: Not used   DRAINS: none  IMPLANTS UTILIZED: None  INDICATIONS FOR SURGERY: Brandon Gibson is a 65 y.o. year old male who has been seen for complaints of left knee pain. MRI demonstrated findings consistent with meniscal pathology. After discussion of the risks and benefits of surgical intervention, the patient expressed understanding of the risks benefits and agree with plans for left knee arthroscopy.   PROCEDURE IN DETAIL: The patient was brought into the operating room and, after adequate general anesthesia was achieved, a tourniquet was applied to the left thigh and the leg was placed in the leg holder. All bony prominences were well padded. The patient's left knee was cleaned and prepped with alcohol and Duraprep and draped in the usual sterile fashion. A "timeout" was performed as per usual protocol. The anticipated portal sites were injected with 0.25% Marcaine with epinephrine. An anterolateral incision was made and a cannula was inserted. A small effusion was evacuated and the knee was distended with fluid using the pump. The scope was advanced down the medial gutter into the medial compartment. Under visualization with the scope, an anteromedial portal was created and a hooked probe was inserted. The medial meniscus was visualized and probed. There was a complex degenerative tear  involving the posterior horn medial meniscus. A large flap had migrated into the intercondylar notch. Also of note was a flap type tear of the anterior horn of the medial meniscus. The tears were debrided using combination of meniscal punches and a 4.5 mm incisor shaver. Final contouring was performed using the 50 ArthroCare wand. The articular cartilage was visualized. There was a focal area of grade 3 chondromalacia involving the medial femoral condyle. The area was debrided using the 50 ArthroCare wand.  The scope was then advanced into the intercondylar notch. The anterior cruciate ligament was visualized and probed and felt to be intact. The scope was removed from the lateral portal and reinserted via the anteromedial portal to better visualize the lateral compartment. The lateral meniscus was visualized and probed. The lateral meniscus was intact without evidence of tear or instability The articular cartilage of the lateral compartment was visualized and noted to be in good condition. Finally, the scope was advanced so as to visualize the patellofemoral articulation. Good patellar tracking was appreciated. The articular surface was in good condition.  The knee was irrigated with copius amounts of fluid and suctioned dry. The anterolateral portal was re-approximated with #3-0 nylon. A combination of 0.25% Marcaine with epinephrine were injected via the scope. The scope was removed and the anteromedial portal was re-approximated with #3-0 nylon. A sterile dressing was applied followed by application of an ice wrap.  The patient tolerated the procedure well and was transported to the PACU in stable condition.  Chistina Roston P. Holley Bouche., M.D.

## 2015-08-11 ENCOUNTER — Encounter: Payer: Self-pay | Admitting: Orthopedic Surgery

## 2015-09-23 ENCOUNTER — Encounter
Admission: RE | Admit: 2015-09-23 | Discharge: 2015-09-23 | Disposition: A | Discharge status: Home / Self Care | Payer: Medicare PPO | Source: Ambulatory Visit | Attending: Orthopedic Surgery | Admitting: Orthopedic Surgery

## 2015-09-23 DIAGNOSIS — I10 Essential (primary) hypertension: Secondary | ICD-10-CM | POA: Insufficient documentation

## 2015-09-23 DIAGNOSIS — I4891 Unspecified atrial fibrillation: Secondary | ICD-10-CM | POA: Insufficient documentation

## 2015-09-23 DIAGNOSIS — Z01812 Encounter for preprocedural laboratory examination: Secondary | ICD-10-CM | POA: Insufficient documentation

## 2015-09-23 DIAGNOSIS — Z01810 Encounter for preprocedural cardiovascular examination: Secondary | ICD-10-CM | POA: Insufficient documentation

## 2015-09-23 HISTORY — DX: Unspecified atrial fibrillation: I48.91

## 2015-09-23 HISTORY — DX: Essential (primary) hypertension: I10

## 2015-09-23 HISTORY — DX: Benign prostatic hyperplasia without lower urinary tract symptoms: N40.0

## 2015-09-23 LAB — ABO/RH: ABO/RH(D): A POS

## 2015-09-23 LAB — SURGICAL PCR SCREEN
MRSA, PCR: NEGATIVE
STAPHYLOCOCCUS AUREUS: NEGATIVE

## 2015-09-23 LAB — URINALYSIS COMPLETE WITH MICROSCOPIC (ARMC ONLY)
BACTERIA UA: NONE SEEN
BILIRUBIN URINE: NEGATIVE
GLUCOSE, UA: NEGATIVE mg/dL
Hgb urine dipstick: NEGATIVE
Ketones, ur: NEGATIVE mg/dL
Leukocytes, UA: NEGATIVE
NITRITE: NEGATIVE
Protein, ur: NEGATIVE mg/dL
Specific Gravity, Urine: 1.017 (ref 1.005–1.030)
pH: 6 (ref 5.0–8.0)

## 2015-09-23 LAB — TYPE AND SCREEN
ABO/RH(D): A POS
ANTIBODY SCREEN: NEGATIVE

## 2015-09-23 LAB — BASIC METABOLIC PANEL
Anion gap: 8 (ref 5–15)
BUN: 19 mg/dL (ref 6–20)
CALCIUM: 9.2 mg/dL (ref 8.9–10.3)
CHLORIDE: 105 mmol/L (ref 101–111)
CO2: 26 mmol/L (ref 22–32)
CREATININE: 1 mg/dL (ref 0.61–1.24)
Glucose, Bld: 109 mg/dL — ABNORMAL HIGH (ref 65–99)
Potassium: 3.8 mmol/L (ref 3.5–5.1)
SODIUM: 139 mmol/L (ref 135–145)

## 2015-09-23 LAB — PROTIME-INR
INR: 1.93
PROTHROMBIN TIME: 22 s — AB (ref 11.4–15.0)

## 2015-09-23 LAB — SEDIMENTATION RATE: Sed Rate: 6 mm/hr (ref 0–20)

## 2015-09-23 LAB — CBC
HEMATOCRIT: 41.8 % (ref 40.0–52.0)
Hemoglobin: 14.1 g/dL (ref 13.0–18.0)
MCH: 29.2 pg (ref 26.0–34.0)
MCHC: 33.6 g/dL (ref 32.0–36.0)
MCV: 86.9 fL (ref 80.0–100.0)
PLATELETS: 150 10*3/uL (ref 150–440)
RBC: 4.81 MIL/uL (ref 4.40–5.90)
RDW: 16.5 % — AB (ref 11.5–14.5)
WBC: 4.8 10*3/uL (ref 3.8–10.6)

## 2015-09-23 LAB — HEMOGLOBIN A1C: HEMOGLOBIN A1C: 5.3 % (ref 4.0–6.0)

## 2015-09-23 LAB — APTT: aPTT: 32 seconds (ref 24–36)

## 2015-09-23 NOTE — Patient Instructions (Signed)
  Your procedure is scheduled on: 10/06/15 Mon Report to Same Day Surgery 2nd floor medical mall To find out your arrival time please call [phone] between 1PM - 3PM on Fri 10/02/15  Remember: Instructions that are not followed completely may result in serious medical risk, up to and including death, or upon the discretion of your surgeon and anesthesiologist your surgery may need to be rescheduled.    _x___ 1. Do not eat food or drink liquids after midnight. No gum chewing or hard candies.     __x__ 2. No Alcohol for 24 hours before or after surgery.   ____ 3. Bring all medications with you on the day of surgery if instructed.    __x__ 4. Notify your doctor if there is any change in your medical condition     (cold, fever, infections).     Do not wear jewelry, make-up, hairpins, clips or nail polish.  Do not wear lotions, powders, or perfumes. You may wear deodorant.  Do not shave 48 hours prior to surgery. Men may shave face and neck.  Do not bring valuables to the hospital.    [facility] is not responsible for any belongings or valuables.               Contacts, dentures or bridgework may not be worn into surgery.  Leave your suitcase in the car. After surgery it may be brought to your room.  For patients admitted to the hospital, discharge time is determined by your treatment team.   Patients discharged the day of surgery will not be allowed to drive home.    Please read over the following fact sheets that you were given:   [facility] Preparing for Surgery and or MRSA Information   _x___ Take these medicines the morning of surgery with A SIP OF WATER:    1. hydrALAZINE (APRESOLINE) 50 MG tablet  2.metoprolol succinate (TOPROL-XL) 25 MG 24 hr tablet  3.omeprazole (PRILOSEC) 40 MG capsule  4.  5.  6.  ____ Fleet Enema (as directed)   _x___ Use CHG Soap or sage wipes as directed on instruction sheet   ____ Use inhalers on the day of surgery and bring to hospital day of  surgery  ____ Stop metformin 2 days prior to surgery    ____ Take 1/2 of usual insulin dose the night before surgery and none on the morning of           surgery.   __x__ Stop aspirin or coumadin, or plavix  Stop aspirin 1 week before surgery  Stop xarelto 3 days before surgery  _x__ Stop Anti-inflammatories such as Advil, Aleve, Ibuprofen, Motrin, Naproxen,          Naprosyn, Goodies powders or aspirin products. Ok to take Tylenol.   ____ Stop supplements until after surgery.    ____ Bring C-Pap to the hospital.

## 2015-09-23 NOTE — Pre-Procedure Instructions (Signed)
Having an aortic CT scan tomorrow with Dr Humphrey Rolls.  A clearance has been requested by Dr Marry Guan.   Had a doppler study done on Monday.

## 2015-09-24 LAB — URINE CULTURE: Culture: NO GROWTH

## 2015-10-05 ENCOUNTER — Inpatient Hospital Stay: Payer: Medicare PPO

## 2015-10-05 ENCOUNTER — Inpatient Hospital Stay: Payer: Medicare PPO | Admitting: Anesthesiology

## 2015-10-05 ENCOUNTER — Encounter
Admission: RE | Disposition: A | Discharge status: Home / Self Care | Payer: Self-pay | Source: Ambulatory Visit | Attending: Orthopedic Surgery

## 2015-10-05 ENCOUNTER — Encounter: Payer: Self-pay | Admitting: *Deleted

## 2015-10-05 ENCOUNTER — Inpatient Hospital Stay
Admission: RE | Admit: 2015-10-05 | Discharge: 2015-10-07 | DRG: 470 | Disposition: A | Discharge status: Home / Self Care | Payer: Medicare PPO | Source: Ambulatory Visit | Attending: Orthopedic Surgery | Admitting: Orthopedic Surgery

## 2015-10-05 DIAGNOSIS — E119 Type 2 diabetes mellitus without complications: Secondary | ICD-10-CM | POA: Diagnosis present

## 2015-10-05 DIAGNOSIS — I10 Essential (primary) hypertension: Secondary | ICD-10-CM | POA: Diagnosis present

## 2015-10-05 DIAGNOSIS — I4891 Unspecified atrial fibrillation: Secondary | ICD-10-CM | POA: Diagnosis present

## 2015-10-05 DIAGNOSIS — Z7901 Long term (current) use of anticoagulants: Secondary | ICD-10-CM | POA: Diagnosis not present

## 2015-10-05 DIAGNOSIS — Z7984 Long term (current) use of oral hypoglycemic drugs: Secondary | ICD-10-CM

## 2015-10-05 DIAGNOSIS — E669 Obesity, unspecified: Secondary | ICD-10-CM | POA: Diagnosis present

## 2015-10-05 DIAGNOSIS — K219 Gastro-esophageal reflux disease without esophagitis: Secondary | ICD-10-CM | POA: Diagnosis present

## 2015-10-05 DIAGNOSIS — M1711 Unilateral primary osteoarthritis, right knee: Secondary | ICD-10-CM | POA: Diagnosis present

## 2015-10-05 DIAGNOSIS — Z7982 Long term (current) use of aspirin: Secondary | ICD-10-CM

## 2015-10-05 DIAGNOSIS — Z6833 Body mass index (BMI) 33.0-33.9, adult: Secondary | ICD-10-CM | POA: Diagnosis not present

## 2015-10-05 DIAGNOSIS — E7800 Pure hypercholesterolemia, unspecified: Secondary | ICD-10-CM | POA: Diagnosis present

## 2015-10-05 DIAGNOSIS — Z96659 Presence of unspecified artificial knee joint: Secondary | ICD-10-CM

## 2015-10-05 DIAGNOSIS — N400 Enlarged prostate without lower urinary tract symptoms: Secondary | ICD-10-CM | POA: Diagnosis present

## 2015-10-05 DIAGNOSIS — Z85828 Personal history of other malignant neoplasm of skin: Secondary | ICD-10-CM

## 2015-10-05 DIAGNOSIS — G4730 Sleep apnea, unspecified: Secondary | ICD-10-CM | POA: Diagnosis present

## 2015-10-05 DIAGNOSIS — Z79899 Other long term (current) drug therapy: Secondary | ICD-10-CM | POA: Diagnosis not present

## 2015-10-05 HISTORY — PX: KNEE ARTHROPLASTY: SHX992

## 2015-10-05 LAB — GLUCOSE, CAPILLARY
GLUCOSE-CAPILLARY: 117 mg/dL — AB (ref 65–99)
Glucose-Capillary: 120 mg/dL — ABNORMAL HIGH (ref 65–99)
Glucose-Capillary: 109 mg/dL — ABNORMAL HIGH (ref 65–99)
Glucose-Capillary: 126 mg/dL — ABNORMAL HIGH (ref 65–99)
Glucose-Capillary: 175 mg/dL — ABNORMAL HIGH (ref 65–99)

## 2015-10-05 SURGERY — ARTHROPLASTY, KNEE, TOTAL, USING IMAGELESS COMPUTER-ASSISTED NAVIGATION
Anesthesia: Spinal | Site: Knee | Laterality: Right | Wound class: Clean

## 2015-10-05 MED ORDER — FENTANYL CITRATE (PF) 100 MCG/2ML IJ SOLN: 25.0000 ug | INTRAMUSCULAR | Status: DC | PRN

## 2015-10-05 MED ORDER — BUPIVACAINE HCL (PF) 0.5 % IJ SOLN
INTRAMUSCULAR | Status: DC | PRN
  Administered 2015-10-05: 2 mL

## 2015-10-05 MED ORDER — ACETAMINOPHEN 10 MG/ML IV SOLN
1000.0000 mg | Freq: Four times a day (QID) | INTRAVENOUS | Status: AC
  Administered 2015-10-05 (×3): 1000 mg via INTRAVENOUS
  Filled 2015-10-05 (×4): qty 100

## 2015-10-05 MED ORDER — ACETAMINOPHEN 10 MG/ML IV SOLN
INTRAVENOUS | Status: AC
  Filled 2015-10-05: qty 100

## 2015-10-05 MED ORDER — SODIUM CHLORIDE 0.9 % IJ SOLN
INTRAMUSCULAR | Status: AC
  Filled 2015-10-05: qty 50

## 2015-10-05 MED ORDER — CEFAZOLIN SODIUM-DEXTROSE 2-4 GM/100ML-% IV SOLN
2.0000 g | Freq: Once | INTRAVENOUS | Status: AC
  Administered 2015-10-05: 2 g via INTRAVENOUS

## 2015-10-05 MED ORDER — HYDRALAZINE HCL 50 MG PO TABS
50.0000 mg | ORAL_TABLET | Freq: Two times a day (BID) | ORAL | Status: DC
  Administered 2015-10-06 – 2015-10-07 (×3): 50 mg via ORAL
  Filled 2015-10-05 (×3): qty 1

## 2015-10-05 MED ORDER — POLYVINYL ALCOHOL 1.4 % OP SOLN
1.0000 [drp] | OPHTHALMIC | Status: DC | PRN
  Filled 2015-10-05: qty 15

## 2015-10-05 MED ORDER — PROPOFOL 10 MG/ML IV BOLUS
INTRAVENOUS | Status: DC | PRN
  Administered 2015-10-05: 70 mg via INTRAVENOUS

## 2015-10-05 MED ORDER — AMIODARONE HCL 200 MG PO TABS
200.0000 mg | ORAL_TABLET | Freq: Every day | ORAL | Status: DC
  Administered 2015-10-06: 200 mg via ORAL
  Filled 2015-10-05: qty 1

## 2015-10-05 MED ORDER — NEOMYCIN-POLYMYXIN B GU 40-200000 IR SOLN
Status: DC | PRN
  Administered 2015-10-05: 14 mL

## 2015-10-05 MED ORDER — CELECOXIB 200 MG PO CAPS
200.0000 mg | ORAL_CAPSULE | Freq: Two times a day (BID) | ORAL | Status: DC
  Administered 2015-10-05 – 2015-10-07 (×5): 200 mg via ORAL
  Filled 2015-10-05 (×6): qty 1

## 2015-10-05 MED ORDER — METOPROLOL SUCCINATE ER 25 MG PO TB24
25.0000 mg | ORAL_TABLET | ORAL | Status: DC
  Administered 2015-10-06 – 2015-10-07 (×2): 25 mg via ORAL
  Filled 2015-10-05 (×2): qty 1

## 2015-10-05 MED ORDER — NIACIN 500 MG PO TABS
500.0000 mg | ORAL_TABLET | Freq: Every day | ORAL | Status: DC
  Administered 2015-10-05 – 2015-10-06 (×2): 500 mg via ORAL
  Filled 2015-10-05 (×3): qty 1

## 2015-10-05 MED ORDER — SODIUM CHLORIDE FLUSH 0.9 % IV SOLN
INTRAVENOUS | Status: AC
  Filled 2015-10-05: qty 3

## 2015-10-05 MED ORDER — PROPOFOL 500 MG/50ML IV EMUL
INTRAVENOUS | Status: DC | PRN
  Administered 2015-10-05: 120 ug/kg/min via INTRAVENOUS

## 2015-10-05 MED ORDER — DIPHENHYDRAMINE HCL 12.5 MG/5ML PO ELIX
12.5000 mg | ORAL_SOLUTION | ORAL | Status: DC | PRN
  Filled 2015-10-05: qty 10

## 2015-10-05 MED ORDER — INSULIN ASPART 100 UNIT/ML ~~LOC~~ SOLN: 0.0000 [IU] | Freq: Every day | SUBCUTANEOUS | Status: DC

## 2015-10-05 MED ORDER — ACETAMINOPHEN 650 MG RE SUPP: 650.0000 mg | Freq: Four times a day (QID) | RECTAL | Status: DC | PRN

## 2015-10-05 MED ORDER — TETRACAINE HCL 1 % IJ SOLN
INTRAMUSCULAR | Status: AC
  Filled 2015-10-05: qty 2

## 2015-10-05 MED ORDER — RIVAROXABAN 10 MG PO TABS
20.0000 mg | ORAL_TABLET | Freq: Every day | ORAL | Status: DC
  Administered 2015-10-06: 20 mg via ORAL
  Filled 2015-10-05: qty 2

## 2015-10-05 MED ORDER — MEPERIDINE HCL 25 MG/ML IJ SOLN
25.0000 mg | INTRAMUSCULAR | Status: DC | PRN
  Administered 2015-10-05: 25 mg via INTRAVENOUS
  Filled 2015-10-05: qty 1

## 2015-10-05 MED ORDER — TRANEXAMIC ACID 1000 MG/10ML IV SOLN
1000.0000 mg | Freq: Once | INTRAVENOUS | Status: AC
  Administered 2015-10-05: 1000 mg via INTRAVENOUS
  Filled 2015-10-05: qty 10

## 2015-10-05 MED ORDER — SENNOSIDES-DOCUSATE SODIUM 8.6-50 MG PO TABS
1.0000 | ORAL_TABLET | Freq: Two times a day (BID) | ORAL | Status: DC
  Administered 2015-10-05 – 2015-10-07 (×5): 1 via ORAL
  Filled 2015-10-05 (×5): qty 1

## 2015-10-05 MED ORDER — INSULIN ASPART 100 UNIT/ML ~~LOC~~ SOLN
0.0000 [IU] | Freq: Three times a day (TID) | SUBCUTANEOUS | Status: DC
Start: 2015-10-05 — End: 2015-10-07
  Administered 2015-10-05: 2 [IU] via SUBCUTANEOUS
  Administered 2015-10-06: 3 [IU] via SUBCUTANEOUS
  Administered 2015-10-07: 2 [IU] via SUBCUTANEOUS
  Filled 2015-10-05 (×2): qty 2
  Filled 2015-10-05: qty 3

## 2015-10-05 MED ORDER — OXYCODONE HCL 5 MG PO TABS
5.0000 mg | ORAL_TABLET | ORAL | Status: DC | PRN
  Administered 2015-10-05 (×2): 5 mg via ORAL
  Administered 2015-10-05 – 2015-10-07 (×7): 10 mg via ORAL
  Filled 2015-10-05: qty 2
  Filled 2015-10-05: qty 1
  Filled 2015-10-05 (×3): qty 2
  Filled 2015-10-05: qty 1
  Filled 2015-10-05 (×3): qty 2

## 2015-10-05 MED ORDER — SODIUM CHLORIDE 0.9 % IV SOLN
INTRAVENOUS | Status: DC
  Administered 2015-10-05 (×2): via INTRAVENOUS

## 2015-10-05 MED ORDER — PANTOPRAZOLE SODIUM 40 MG PO TBEC
40.0000 mg | DELAYED_RELEASE_TABLET | Freq: Two times a day (BID) | ORAL | Status: DC
  Administered 2015-10-05 – 2015-10-07 (×4): 40 mg via ORAL
  Filled 2015-10-05 (×4): qty 1

## 2015-10-05 MED ORDER — ONDANSETRON HCL 4 MG/2ML IJ SOLN: 4.0000 mg | Freq: Four times a day (QID) | INTRAMUSCULAR | Status: DC | PRN

## 2015-10-05 MED ORDER — ALUM & MAG HYDROXIDE-SIMETH 200-200-20 MG/5ML PO SUSP: 30.0000 mL | ORAL | Status: DC | PRN

## 2015-10-05 MED ORDER — SODIUM CHLORIDE 0.9 % IV SOLN
INTRAVENOUS | Status: DC
  Administered 2015-10-05 – 2015-10-06 (×3): via INTRAVENOUS

## 2015-10-05 MED ORDER — MIDAZOLAM HCL 5 MG/5ML IJ SOLN
INTRAMUSCULAR | Status: DC | PRN
  Administered 2015-10-05: 1 mg via INTRAVENOUS

## 2015-10-05 MED ORDER — ACETAMINOPHEN 10 MG/ML IV SOLN
INTRAVENOUS | Status: DC | PRN
  Administered 2015-10-05: 1000 mg via INTRAVENOUS

## 2015-10-05 MED ORDER — TRAMADOL HCL 50 MG PO TABS
50.0000 mg | ORAL_TABLET | ORAL | Status: DC | PRN
  Administered 2015-10-05 – 2015-10-06 (×2): 50 mg via ORAL
  Administered 2015-10-06: 100 mg via ORAL
  Filled 2015-10-05: qty 2
  Filled 2015-10-05 (×2): qty 1

## 2015-10-05 MED ORDER — SUCRALFATE 1 G PO TABS: 1.0000 g | ORAL_TABLET | Freq: Three times a day (TID) | ORAL | Status: DC | PRN

## 2015-10-05 MED ORDER — ACETAMINOPHEN 325 MG PO TABS: 650.0000 mg | ORAL_TABLET | Freq: Four times a day (QID) | ORAL | Status: DC | PRN

## 2015-10-05 MED ORDER — SODIUM CHLORIDE 0.9 % IV SOLN
1500.0000 mg | INTRAVENOUS | Status: AC
  Administered 2015-10-05: 1500 mg via INTRAVENOUS
  Filled 2015-10-05: qty 15

## 2015-10-05 MED ORDER — BISACODYL 10 MG RE SUPP: 10.0000 mg | Freq: Every day | RECTAL | Status: DC | PRN

## 2015-10-05 MED ORDER — TETRACAINE HCL 1 % IJ SOLN
INTRAMUSCULAR | Status: DC | PRN
  Administered 2015-10-05: 10 mg via INTRASPINAL

## 2015-10-05 MED ORDER — METFORMIN HCL 500 MG PO TABS
500.0000 mg | ORAL_TABLET | Freq: Two times a day (BID) | ORAL | Status: DC
  Administered 2015-10-05 – 2015-10-07 (×4): 500 mg via ORAL
  Filled 2015-10-05 (×4): qty 1

## 2015-10-05 MED ORDER — MAGNESIUM HYDROXIDE 400 MG/5ML PO SUSP: 30.0000 mL | Freq: Every day | ORAL | Status: DC | PRN

## 2015-10-05 MED ORDER — FLEET ENEMA 7-19 GM/118ML RE ENEM: 1.0000 | ENEMA | Freq: Once | RECTAL | Status: DC | PRN

## 2015-10-05 MED ORDER — HYDROCHLOROTHIAZIDE 25 MG PO TABS
25.0000 mg | ORAL_TABLET | ORAL | Status: DC
  Administered 2015-10-06 – 2015-10-07 (×2): 25 mg via ORAL
  Filled 2015-10-05 (×2): qty 1

## 2015-10-05 MED ORDER — SODIUM CHLORIDE 0.9 % IV SOLN
INTRAVENOUS | Status: DC | PRN
  Administered 2015-10-05: 60 mL

## 2015-10-05 MED ORDER — ONDANSETRON HCL 4 MG PO TABS: 4.0000 mg | ORAL_TABLET | Freq: Four times a day (QID) | ORAL | Status: DC | PRN

## 2015-10-05 MED ORDER — BUPIVACAINE-EPINEPHRINE (PF) 0.25% -1:200000 IJ SOLN
INTRAMUSCULAR | Status: DC | PRN
  Administered 2015-10-05: 30 mL via PERINEURAL

## 2015-10-05 MED ORDER — NEOMYCIN-POLYMYXIN B GU 40-200000 IR SOLN
Status: AC
  Filled 2015-10-05: qty 20

## 2015-10-05 MED ORDER — MENTHOL 3 MG MT LOZG
1.0000 | LOZENGE | OROMUCOSAL | Status: DC | PRN
  Filled 2015-10-05: qty 9

## 2015-10-05 MED ORDER — METOCLOPRAMIDE HCL 10 MG PO TABS
10.0000 mg | ORAL_TABLET | Freq: Three times a day (TID) | ORAL | Status: DC
  Administered 2015-10-05 – 2015-10-07 (×7): 10 mg via ORAL
  Filled 2015-10-05 (×7): qty 1

## 2015-10-05 MED ORDER — SODIUM CHLORIDE FLUSH 0.9 % IV SOLN
INTRAVENOUS | Status: AC
  Filled 2015-10-05: qty 10

## 2015-10-05 MED ORDER — ROSUVASTATIN CALCIUM 20 MG PO TABS
20.0000 mg | ORAL_TABLET | Freq: Every day | ORAL | Status: DC
  Administered 2015-10-05 – 2015-10-06 (×2): 20 mg via ORAL
  Filled 2015-10-05 (×2): qty 1

## 2015-10-05 MED ORDER — POTASSIUM CHLORIDE CRYS ER 10 MEQ PO TBCR
10.0000 meq | EXTENDED_RELEASE_TABLET | Freq: Every day | ORAL | Status: DC
  Administered 2015-10-05 – 2015-10-06 (×2): 10 meq via ORAL
  Filled 2015-10-05 (×2): qty 1

## 2015-10-05 MED ORDER — PHENOL 1.4 % MT LIQD
1.0000 | OROMUCOSAL | Status: DC | PRN
  Filled 2015-10-05: qty 177

## 2015-10-05 MED ORDER — BUPIVACAINE-EPINEPHRINE (PF) 0.25% -1:200000 IJ SOLN
INTRAMUSCULAR | Status: AC
  Filled 2015-10-05: qty 30

## 2015-10-05 MED ORDER — FERROUS SULFATE 325 (65 FE) MG PO TABS
325.0000 mg | ORAL_TABLET | Freq: Two times a day (BID) | ORAL | Status: DC
  Administered 2015-10-05 – 2015-10-07 (×3): 325 mg via ORAL
  Filled 2015-10-05 (×3): qty 1

## 2015-10-05 MED ORDER — BUPIVACAINE LIPOSOME 1.3 % IJ SUSP
INTRAMUSCULAR | Status: AC
  Filled 2015-10-05: qty 20

## 2015-10-05 MED ORDER — CEFAZOLIN SODIUM-DEXTROSE 2-4 GM/100ML-% IV SOLN
2.0000 g | Freq: Four times a day (QID) | INTRAVENOUS | Status: AC
  Administered 2015-10-05 – 2015-10-06 (×4): 2 g via INTRAVENOUS
  Filled 2015-10-05 (×4): qty 100

## 2015-10-05 MED ORDER — ONDANSETRON HCL 4 MG/2ML IJ SOLN: 4.0000 mg | Freq: Once | INTRAMUSCULAR | Status: DC | PRN

## 2015-10-05 MED ORDER — CEFAZOLIN SODIUM-DEXTROSE 2-4 GM/100ML-% IV SOLN
INTRAVENOUS | Status: AC
  Filled 2015-10-05: qty 100

## 2015-10-05 SURGICAL SUPPLY — 63 items
AUTOTRANSFUS HAS 1/8 (MISCELLANEOUS) ×3
BATTERY INSTRU NAVIGATION (MISCELLANEOUS) ×12 IMPLANT
BLADE SAW 1 (BLADE) ×3 IMPLANT
BLADE SAW 1/2 (BLADE) ×3 IMPLANT
BONE CEMENT GENTAMICIN (Cement) ×6 IMPLANT
BTRY SRG DRVR LF (MISCELLANEOUS) ×4
CANISTER SUCT 1200ML W/VALVE (MISCELLANEOUS) ×3 IMPLANT
CANISTER SUCT 3000ML (MISCELLANEOUS) ×6 IMPLANT
CAPT KNEE TOTAL 3 ATTUNE ×3 IMPLANT
CATH TRAY METER 16FR LF (MISCELLANEOUS) ×3 IMPLANT
CEMENT BONE GENTAMICIN 40 (Cement) ×2 IMPLANT
COOLER POLAR GLACIER W/PUMP (MISCELLANEOUS) ×3 IMPLANT
CUFF TOURN 24 STER (MISCELLANEOUS) IMPLANT
CUFF TOURN 30 STER DUAL PORT (MISCELLANEOUS) ×3 IMPLANT
DRAPE SHEET LG 3/4 BI-LAMINATE (DRAPES) ×3 IMPLANT
DRSG DERMACEA 8X12 NADH (GAUZE/BANDAGES/DRESSINGS) ×3 IMPLANT
DRSG OPSITE POSTOP 4X14 (GAUZE/BANDAGES/DRESSINGS) ×3 IMPLANT
DRSG TEGADERM 4X4.75 (GAUZE/BANDAGES/DRESSINGS) ×3 IMPLANT
DURAPREP 26ML APPLICATOR (WOUND CARE) ×6 IMPLANT
ELECT CAUTERY BLADE 6.4 (BLADE) ×3 IMPLANT
ELECT REM PT RETURN 9FT ADLT (ELECTROSURGICAL) ×3
ELECTRODE REM PT RTRN 9FT ADLT (ELECTROSURGICAL) ×1 IMPLANT
EX-PIN ORTHOLOCK NAV 4X150 (PIN) ×6 IMPLANT
GLOVE BIO SURGEON STRL SZ7 (GLOVE) ×6 IMPLANT
GLOVE BIOGEL M STRL SZ7.5 (GLOVE) ×6 IMPLANT
GLOVE INDICATOR 7.5 STRL GRN (GLOVE) ×3 IMPLANT
GLOVE INDICATOR 8.0 STRL GRN (GLOVE) ×3 IMPLANT
GLOVE SURG 9.0 ORTHO LTXF (GLOVE) ×3 IMPLANT
GLOVE SURG ORTHO 9.0 STRL STRW (GLOVE) ×3 IMPLANT
GOWN STRL REUS W/ TWL LRG LVL3 (GOWN DISPOSABLE) ×2 IMPLANT
GOWN STRL REUS W/TWL 2XL LVL3 (GOWN DISPOSABLE) ×3 IMPLANT
GOWN STRL REUS W/TWL LRG LVL3 (GOWN DISPOSABLE) ×4
HANDPIECE SUCTION TUBG SURGILV (MISCELLANEOUS) ×3 IMPLANT
HOLDER FOLEY CATH W/STRAP (MISCELLANEOUS) ×3 IMPLANT
HOOD PEEL AWAY FLYTE STAYCOOL (MISCELLANEOUS) ×6 IMPLANT
KIT RM TURNOVER STRD PROC AR (KITS) ×3 IMPLANT
KNIFE SCULPS 14X20 (INSTRUMENTS) ×3 IMPLANT
NDL SAFETY 18GX1.5 (NEEDLE) ×3 IMPLANT
NEEDLE SPNL 20GX3.5 QUINCKE YW (NEEDLE) ×3 IMPLANT
NS IRRIG 500ML POUR BTL (IV SOLUTION) ×3 IMPLANT
PACK TOTAL KNEE (MISCELLANEOUS) ×3 IMPLANT
PAD WRAPON POLAR KNEE (MISCELLANEOUS) ×1 IMPLANT
PIN DRILL QUICK PACK ×3 IMPLANT
PIN FIXATION 1/8DIA X 3INL (PIN) ×3 IMPLANT
SOL .9 NS 3000ML IRR  AL (IV SOLUTION) ×2
SOL .9 NS 3000ML IRR AL (IV SOLUTION) ×1
SOL .9 NS 3000ML IRR UROMATIC (IV SOLUTION) ×1 IMPLANT
SOL PREP PVP 2OZ (MISCELLANEOUS) ×3
SOLUTION PREP PVP 2OZ (MISCELLANEOUS) ×1 IMPLANT
SPONGE DRAIN TRACH 4X4 STRL 2S (GAUZE/BANDAGES/DRESSINGS) ×3 IMPLANT
STAPLER SKIN PROX 35W (STAPLE) ×3 IMPLANT
SUCTION FRAZIER HANDLE 10FR (MISCELLANEOUS) ×2
SUCTION TUBE FRAZIER 10FR DISP (MISCELLANEOUS) ×1 IMPLANT
SUT VIC AB 0 CT1 36 (SUTURE) ×3 IMPLANT
SUT VIC AB 1 CT1 36 (SUTURE) ×6 IMPLANT
SUT VIC AB 2-0 CT2 27 (SUTURE) ×3 IMPLANT
SYR 20CC LL (SYRINGE) ×3 IMPLANT
SYR 30ML LL (SYRINGE) ×3 IMPLANT
SYR 50ML LL SCALE MARK (SYRINGE) ×3 IMPLANT
SYSTEM AUTOTRANSFUS DUAL TROCR (MISCELLANEOUS) ×1 IMPLANT
TOWEL OR 17X26 4PK STRL BLUE (TOWEL DISPOSABLE) ×3 IMPLANT
TOWER CARTRIDGE SMART MIX (DISPOSABLE) ×3 IMPLANT
WRAPON POLAR PAD KNEE (MISCELLANEOUS) ×3

## 2015-10-05 NOTE — Care Management Note (Addendum)
Case Management Note  Patient Details  Name: Brandon Gibson MRN: [id number] Date of Birth: 12/05/1950  Subjective/Objective:     65yo Mr Brandon Gibson received a right knee repair on 10/05/15 by Dr Marry Guan. He lives alone but has friends and family who will be checking on him. He reports that he has discussed his home situation with Dr Marry Guan. PCP=Dr Neelam Humphrey Rolls. Pharmacy=[facility] on [address]. Home equipment includes a RW, BSC, and crutches. No home oxygen and no current home health services. Reports that he has numerous friends and family who have agreed to provide transportation to appointments. Stated "I will use any home health agency that will accept my insurance."  [facility] was chosen. Verified with Dr Marry Guan by phone that Mr Brandon Gibson will be discharged home with his current XARALTO continued. Dr Marry Guan does not want him to have Lovenox. Case management will follow for discharge planning.                Action/Plan:   Expected Discharge Date:  10/08/15               Expected Discharge Plan:     In-House Referral:     Discharge planning Services     Post Acute Care Choice:    Choice offered to:     DME Arranged:    DME Agency:     HH Arranged:    HH Agency:     Status of Service:     Medicare Important Message Given:    Date Medicare IM Given:    Medicare IM give by:    Date Additional Medicare IM Given:    Additional Medicare Important Message give by:     If discussed at [place] of Stay Meetings, dates discussed:    Additional Comments:  Yardley Lekas A, RN 10/05/2015, 3:34 PM

## 2015-10-05 NOTE — Progress Notes (Signed)
Pt with blood pressure of 103/55 and heart rate in 50's pt has amiodarone and hydrazaline ordered for tonight. Spoke with Dr. Rudene Christians, Hold these medications tonight.

## 2015-10-05 NOTE — Progress Notes (Signed)
Dangled at bedside without difficulty.

## 2015-10-05 NOTE — Transfer of Care (Signed)
Immediate Anesthesia Transfer of Care Note  Patient: Brandon Gibson  Procedure(s) Performed: Procedure(s): COMPUTER ASSISTED TOTAL KNEE ARTHROPLASTY (Right)  Patient Location: PACU  Anesthesia Type:Spinal  Level of Consciousness: patient cooperative and lethargic  Airway & Oxygen Therapy: Patient Spontanous Breathing  Post-op Assessment: Report given to RN and Post -op Vital signs reviewed and stable  Post vital signs: Reviewed and stable  Last Vitals:  Filed Vitals:   10/05/15 0614 10/05/15 1054  BP: 154/87 101/66  Pulse: 70 60  Temp: 36.9 C 37.1 C  Resp: 16 19    Last Pain: There were no vitals filed for this visit.       Complications: No apparent anesthesia complications

## 2015-10-05 NOTE — H&P (Signed)
The patient has been re-examined, and the chart reviewed, and there have been no interval changes to the documented history and physical.    The risks, benefits, and alternatives have been discussed at length. The patient expressed understanding of the risks benefits and agreed with plans for surgical intervention.  Robina Hamor P. Sherrilyn Nairn, Jr. M.D.    

## 2015-10-05 NOTE — Progress Notes (Signed)
PT Cancellation Note  Patient Details Name: Brandon Gibson MRN: [id number] DOB: 25-Apr-1951   Cancelled Treatment:    Reason Eval/Treat Not Completed: Medical issues which prohibited therapy. Chart reviewed and RN consulted. Attempted PT evaluation however pt unable to demonstrate active movement of R ankle or toes. Pt lacks sensation to R foot with testing. RN notified and agrees to dangle patient. PT evaluation will be performed tomorrow AM.  [name] PT, DPT   [name] 10/05/2015, 3:12 PM

## 2015-10-05 NOTE — Brief Op Note (Signed)
10/05/2015  10:53 AM  PATIENT:  Brandon Gibson  65 y.o. male  PRE-OPERATIVE DIAGNOSIS:  OSTEOARTHRITIS RIGHT KNEE  POST-OPERATIVE DIAGNOSIS:  OSTEOARTHRITIS RIGHT KNEE  PROCEDURE:  Procedure(s): COMPUTER ASSISTED TOTAL KNEE ARTHROPLASTY (Right)  SURGEON:  Surgeon(s) and Role:    * Dereck Leep, MD - Primary  ASSISTANTS: Vance Peper, PA   ANESTHESIA:   spinal  EBL:  Total I/O In: 800 [I.V.:800] Out: 225 [Urine:175; Blood:50]  BLOOD ADMINISTERED:none  DRAINS: 2 medium drains to a reinfusion system   LOCAL MEDICATIONS USED:  MARCAINE    and OTHER Exparel  SPECIMEN:  No Specimen  DISPOSITION OF SPECIMEN:  N/A  COUNTS:  YES  TOURNIQUET:   102 minutes  DICTATION: .Dragon Dictation  PLAN OF CARE: Admit to inpatient   PATIENT DISPOSITION:  PACU - hemodynamically stable.   Delay start of Pharmacological VTE agent (>24hrs) due to surgical blood loss or risk of bleeding: yes

## 2015-10-05 NOTE — Op Note (Signed)
OPERATIVE NOTE  DATE OF SURGERY:  10/05/2015  PATIENT NAME:  Mehkai Isaguirre   DOB: 1951/02/17  MRN: [id number]  PRE-OPERATIVE DIAGNOSIS: Degenerative arthrosis of the right knee, primary  POST-OPERATIVE DIAGNOSIS:  Same  PROCEDURE:  Right total knee arthroplasty using computer-assisted navigation  SURGEON:  Marciano Sequin. M.D.  ASSISTANT:  Vance Peper, PA (present and scrubbed throughout the case, critical for assistance with exposure, retraction, instrumentation, and closure)  ANESTHESIA: spinal  ESTIMATED BLOOD LOSS: 50 mL  FLUIDS REPLACED: 800 mL of crystalloid  TOURNIQUET TIME: 102 minutes  DRAINS: 2 medium drains to a reinfusion system  SOFT TISSUE RELEASES: Anterior cruciate ligament, posterior cruciate ligament, deep and superficial medial collateral ligament, patellofemoral ligament   IMPLANTS UTILIZED: DePuy Attune size 7 posterior stabilized femoral component (cemented), size 7 rotating platform tibial component (cemented), 41 mm medialized dome patella (cemented), and a 5 mm stabilized rotating platform polyethylene insert.  INDICATIONS FOR SURGERY: Adnaan Mowad is a 65 y.o. year old male with a long history of progressive knee pain. X-rays demonstrated severe degenerative changes in tricompartmental fashion. The patient had not seen any significant improvement despite conservative nonsurgical intervention. After discussion of the risks and benefits of surgical intervention, the patient expressed understanding of the risks benefits and agree with plans for total knee arthroplasty.   The risks, benefits, and alternatives were discussed at length including but not limited to the risks of infection, bleeding, nerve injury, stiffness, blood clots, the need for revision surgery, cardiopulmonary complications, among others, and they were willing to proceed.  PROCEDURE IN DETAIL: The patient was brought into the operating room and, after adequate spinal anesthesia  was achieved, a tourniquet was placed on the patient's upper thigh. The patient's knee and leg were cleaned and prepped with alcohol and DuraPrep and draped in the usual sterile fashion. A "timeout" was performed as per usual protocol. The lower extremity was exsanguinated using an Esmarch, and the tourniquet was inflated to 300 mmHg. An anterior longitudinal incision was made followed by a standard mid vastus approach. The deep fibers of the medial collateral ligament were elevated in a subperiosteal fashion off of the medial flare of the tibia so as to maintain a continuous soft tissue sleeve. The patella was subluxed laterally and the patellofemoral ligament was incised. Inspection of the knee demonstrated severe degenerative changes with full-thickness loss of articular cartilage. Osteophytes were debrided using a rongeur. Anterior and posterior cruciate ligaments were excised. Two 4.0 mm Schanz pins were inserted in the femur and into the tibia for attachment of the array of trackers used for computer-assisted navigation. Hip center was identified using a circumduction technique. Distal landmarks were mapped using the computer. The distal femur and proximal tibia were mapped using the computer. The distal femoral cutting guide was positioned using computer-assisted navigation so as to achieve a 5 distal valgus cut. The femur was sized and it was felt that a size 7 femoral component was appropriate. A size 7 femoral cutting guide was positioned and the anterior cut was performed and verified using the computer. This was followed by completion of the posterior and chamfer cuts. Femoral cutting guide for the central box was then positioned in the center box cut was performed.  Attention was then directed to the proximal tibia. Medial and lateral menisci were excised. The extramedullary tibial cutting guide was positioned using computer-assisted navigation so as to achieve a 0 varus-valgus alignment and 3  posterior slope. The cut was performed  and verified using the computer. The proximal tibia was sized and it was felt that a size 7 tibial tray was appropriate. Tibial and femoral trials were inserted followed by insertion of a 5 mm polyethylene insert. The knee was felt to be tight medially. A Cobb elevator was used to elevate the superficial fibers of the medial collateral ligament. This allowed for excellent mediolateral soft tissue balancing both in flexion and in full extension. Finally, the patella was cut and prepared so as to accommodate a 41 mm medialized dome patella. A patella trial was placed and the knee was placed through a range of motion with excellent patellar tracking appreciated. The femoral trial was removed after debridement of posterior osteophytes. The central post-hole for the tibial component was reamed followed by insertion of a keel punch. Tibial trials were then removed. Cut surfaces of bone were irrigated with copious amounts of normal saline with antibiotic solution using pulsatile lavage and then suctioned dry. Polymethylmethacrylate cement with gentamicin was prepared in the usual fashion using a vacuum mixer. Cement was applied to the cut surface of the proximal tibia as well as along the undersurface of a size 7 rotating platform tibial component. Tibial component was positioned and impacted into place. Excess cement was removed using [employer] fast streamer. Cement was then applied to the cut surfaces of the femur as well as along the posterior flanges of the size 7 femoral component. The femoral component was positioned and impacted into place. Excess cement was removed using [employer] fast streamer. A 5 mm polyethylene trial was inserted and the knee was brought into full extension with steady axial compression applied. Finally, cement was applied to the backside of a 41 mm medialized dome patella and the patellar component was positioned and patellar clamp applied. Excess cement was removed  using [employer] fast streamer. After adequate curing of the cement, the tourniquet was deflated after a total tourniquet time of 102 minutes. Hemostasis was achieved using electrocautery. The knee was irrigated with copious amounts of normal saline with antibiotic solution using pulsatile lavage and then suctioned dry. 20 mL of 1.3% Exparel in 40 mL of normal saline was injected along the posterior capsule, medial and lateral gutters, and along the arthrotomy site. A 5 mm stabilized rotating platform polyethylene insert was inserted and the knee was placed through a range of motion with excellent mediolateral soft tissue balancing appreciated and excellent patellar tracking noted. 2 medium drains were placed in the wound bed and brought out through separate stab incisions to be attached to a reinfusion system. The medial parapatellar portion of the incision was reapproximated using interrupted sutures of #1 Vicryl. Subcutaneous tissue was then injected with a total of 30 cc of 0.25% Marcaine with epinephrine. Subcutaneous tissue was approximated in layers using first #0 Vicryl followed #2-0 Vicryl. The skin was approximated with skin staples. A sterile dressing was applied.  The patient tolerated the procedure well and was transported to the recovery room in stable condition.    Presleigh Feldstein P. Holley Bouche., M.D.

## 2015-10-05 NOTE — Care Management Note (Signed)
Case Management Note  Patient Details  Name: Avry Sallade MRN: [id number] Date of Birth: 1950/10/03  Subjective/Objective:                    Action/Plan:   Expected Discharge Date:  10/08/15               Expected Discharge Plan:     In-House Referral:     Discharge planning Services     Post Acute Care Choice:    Choice offered to:     DME Arranged:    DME Agency:     HH Arranged:    [place] Agency:     Status of Service:     Medicare Important Message Given:    Date Medicare IM Given:    Medicare IM give by:    Date Additional Medicare IM Given:    Additional Medicare Important Message give by:     If discussed at [place] of Stay Meetings, dates discussed:    Additional Comments:  Padraig Nhan A, RN 10/05/2015, 3:56 PM

## 2015-10-05 NOTE — Anesthesia Procedure Notes (Signed)
Spinal  Start time: 10/05/2015 7:22 AM End time: 10/05/2015 7:27 AM Staffing Anesthesiologist: Alvin Critchley Resident/CRNA: Jonna Clark Performed by: resident/CRNA  Preanesthetic Checklist Completed: patient identified, site marked, surgical consent, pre-op evaluation, timeout performed, IV checked, risks and benefits discussed and monitors and equipment checked Spinal Block Patient position: sitting Prep: Betadine Patient monitoring: heart rate, continuous pulse ox and blood pressure Approach: midline Location: L3-4 Needle Needle type: Whitacre  Needle gauge: 25 G Needle length: 9 cm Assessment Sensory level: T8

## 2015-10-05 NOTE — Anesthesia Preprocedure Evaluation (Signed)
Anesthesia Evaluation  Patient identified by MRN, date of birth, ID band Patient awake    Reviewed: Allergy & Precautions, NPO status , Patient's Chart, lab work & pertinent test results, reviewed documented beta blocker date and time   Airway Mallampati: III  TM Distance: >3 FB     Dental  (+) Chipped, Missing   Pulmonary sleep apnea and Continuous Positive Airway Pressure Ventilation ,    Pulmonary exam normal        Cardiovascular hypertension, Pt. on medications and Pt. on home beta blockers Normal cardiovascular exam+ dysrhythmias Atrial Fibrillation      Neuro/Psych negative neurological ROS  negative psych ROS   GI/Hepatic Neg liver ROS, GERD  Medicated and Controlled,  Endo/Other  diabetes, Well Controlled, Type 2, Oral Hypoglycemic Agents  Renal/GU negative Renal ROS  negative genitourinary   Musculoskeletal  (+) Arthritis , Osteoarthritis,    Abdominal Normal abdominal exam  (+)   Peds negative pediatric ROS (+)  Hematology negative hematology ROS (+)   Anesthesia Other Findings Good neck movement. Will use CPAP tonite. Obese. Takes xarelto for Afib. No stents. 2 missing teeth.  Reproductive/Obstetrics                             Anesthesia Physical  Anesthesia Plan  ASA: III  Anesthesia Plan: Spinal   Post-op Pain Management:    Induction: Intravenous  Airway Management Planned: Nasal Cannula  Additional Equipment:   Intra-op Plan:   Post-operative Plan:   Informed Consent: I have reviewed the patients History and Physical, chart, labs and discussed the procedure including the risks, benefits and alternatives for the proposed anesthesia with the patient or authorized representative who has indicated his/her understanding and acceptance.   Dental advisory given  Plan Discussed with: CRNA and Surgeon  Anesthesia Plan Comments:        Anesthesia Quick  Evaluation

## 2015-10-06 LAB — CBC
HCT: 34.6 % — ABNORMAL LOW (ref 40.0–52.0)
Hemoglobin: 11.6 g/dL — ABNORMAL LOW (ref 13.0–18.0)
MCH: 29.9 pg (ref 26.0–34.0)
MCHC: 33.6 g/dL (ref 32.0–36.0)
MCV: 88.9 fL (ref 80.0–100.0)
PLATELETS: 125 10*3/uL — AB (ref 150–440)
RBC: 3.89 MIL/uL — AB (ref 4.40–5.90)
RDW: 16.3 % — ABNORMAL HIGH (ref 11.5–14.5)
WBC: 4.6 10*3/uL (ref 3.8–10.6)

## 2015-10-06 LAB — BASIC METABOLIC PANEL
ANION GAP: 5 (ref 5–15)
BUN: 9 mg/dL (ref 6–20)
CO2: 26 mmol/L (ref 22–32)
Calcium: 8 mg/dL — ABNORMAL LOW (ref 8.9–10.3)
Chloride: 110 mmol/L (ref 101–111)
Creatinine, Ser: 0.88 mg/dL (ref 0.61–1.24)
Glucose, Bld: 105 mg/dL — ABNORMAL HIGH (ref 65–99)
POTASSIUM: 4.1 mmol/L (ref 3.5–5.1)
SODIUM: 141 mmol/L (ref 135–145)

## 2015-10-06 LAB — GLUCOSE, CAPILLARY
GLUCOSE-CAPILLARY: 142 mg/dL — AB (ref 65–99)
GLUCOSE-CAPILLARY: 152 mg/dL — AB (ref 65–99)
Glucose-Capillary: 101 mg/dL — ABNORMAL HIGH (ref 65–99)
Glucose-Capillary: 152 mg/dL — ABNORMAL HIGH (ref 65–99)

## 2015-10-06 MED ORDER — TRAMADOL HCL 50 MG PO TABS: 50.0000 mg | ORAL_TABLET | ORAL | Status: DC | PRN

## 2015-10-06 MED ORDER — OXYCODONE HCL 5 MG PO TABS: 5.0000 mg | ORAL_TABLET | ORAL | Status: DC | PRN

## 2015-10-06 NOTE — Clinical Social Work Note (Signed)
CSW consulted for New SNF. CSW received chart, and PT is recommending HHPT. CSW updated RNCM, who will follow for discharge planning needs. CSW is signing off as no further needs identified.   Darden Dates, MSW, LCSW  Clinical Social Worker  [phone]

## 2015-10-06 NOTE — Progress Notes (Signed)
   Subjective: 1 Day Post-Op Procedure(s) (LRB): COMPUTER ASSISTED TOTAL KNEE ARTHROPLASTY (Right) Patient reports pain as 3 on 0-10 scale.   Patient is well, and has had no acute complaints or problems We will start therapy today.  Plan is to go Home after hospital stay. no nausea and no vomiting Patient denies any chest pains or shortness of breath. Patient states that he rested fairly well during the night  Objective: Vital signs in last 24 hours: Temp:  [97.4 F (36.3 C)-98.7 F (37.1 C)] 98 F (36.7 C) (06/06 0410) Pulse Rate:  [43-62] 60 (06/06 0410) Resp:  [10-19] 18 (06/06 0410) BP: (101-150)/(55-84) 133/84 mmHg (06/06 0410) SpO2:  [94 %-99 %] 99 % (06/06 0410) Weight:  [106.142 kg (234 lb)] 106.142 kg (234 lb) (06/05 1400) Heels are non tender and elevated off the bed using rolled towels Intake/Output from previous day: 06/05 0701 - 06/06 0700 In: 3355.7 [I.V.:2645.7; IV Piggyback:710] Out: 2283 [Urine:1855; Drains:378; Blood:50] Intake/Output this shift:     Recent Labs  10/06/15 0533  HGB 11.6*    Recent Labs  10/06/15 0533  WBC 4.6  RBC 3.89*  HCT 34.6*  PLT 125*    Recent Labs  10/06/15 0533  NA 141  K 4.1  CL 110  CO2 26  BUN 9  CREATININE 0.88  GLUCOSE 105*  CALCIUM 8.0*   No results for input(s): LABPT, INR in the last 72 hours.  EXAM General - Patient is Alert, Appropriate and Oriented Extremity - Neurologically intact Neurovascular intact Sensation intact distally Intact pulses distally Dorsiflexion/Plantar flexion intact Compartment soft Dressing - dressing C/D/I Motor Function - intact, moving foot and toes well on exam.    Past Medical History  Diagnosis Date  . Hypercholesteremia   . Sleep apnea     sleep study Dr. Chancy Milroy, uses CPAP  . GERD (gastroesophageal reflux disease)   . Arthritis   . H/O pleurisy   . Dysrhythmia     IRREG HEART BEAT  . Diabetes mellitus without complication ([place])   . Hypertension   .  Cancer (HCC)     HX SKIN CANCER   . BPH (benign prostatic hyperplasia)   . Atrial fibrillation (HCC)     Assessment/Plan: 1 Day Post-Op Procedure(s) (LRB): COMPUTER ASSISTED TOTAL KNEE ARTHROPLASTY (Right) Active Problems:   S/P total knee arthroplasty  Estimated body mass index is 33.58 kg/(m^2) as calculated from the following:   Height as of this encounter: 5\' 10"  (1.778 m).   Weight as of this encounter: 106.142 kg (234 lb). Advance diet Up with therapy D/C IV fluids Plan for discharge tomorrow Discharge home with home health  Labs: Were reviewed DVT Prophylaxis - Xarelto, Foot Pumps and TED hose Weight-Bearing as tolerated to right leg D/C O2 and Pulse OX and try on Room Air  Dejan Angert R. [place] 10/06/2015, 7:12 AM

## 2015-10-06 NOTE — Progress Notes (Signed)
Physical Therapy Treatment Patient Details Name: Nesta Cude MRN: [id number] DOB: [birth year] Today's Date: 10/06/2015    History of Present Illness Pt underwent R TKR without reported post-op complications. Attempted evaluation on POD#0 however unable to perform due to slow return of sensory/motor function. PT evaluation performed on POD#1.  No reported falls in the last 12 months    PT Comments    Pt demonstrates improving gait quality, speed, and distance this afternoon. He is reporting increased pain levels and RN notified that pt requesting pain medication. Pt able to complete all supine exercises as instructed with slowly increasing R knee flexion. Will attempt full lap around RN station and stairs tomorrow AM in anticipation of possible discharge in the afternoon. Pt will benefit from skilled PT services to address deficits in strength, balance, and mobility in order to return to full function at home.    Follow Up Recommendations  Home health PT     Equipment Recommendations  Rolling walker with 5" wheels    Recommendations for Other Services       Precautions / Restrictions Precautions Precautions: Knee Precaution Booklet Issued: Yes (comment) Required Braces or Orthoses: Knee Immobilizer - Right Knee Immobilizer - Right: Discontinue once straight leg raise with < 10 degree lag Restrictions Weight Bearing Restrictions: Yes RLE Weight Bearing: Weight bearing as tolerated    Mobility  Bed Mobility Overal bed mobility: Needs Assistance Bed Mobility: Supine to Sit     Supine to sit: Supervision     General bed mobility comments: Pt demonstrates improved speed and sequencing with bed mobility. Able to control eccentric R knee flexion without assistance  Transfers Overall transfer level: Needs assistance Equipment used: Rolling walker (2 wheeled) Transfers: Sit to/from Stand Sit to Stand: Min guard         General transfer comment: Pt demonstrates  improved hand placement and increased speed with transfers. Improving weight shifting to RLE  Ambulation/Gait Ambulation/Gait assistance: Min guard Ambulation Distance (Feet): 150 Feet Assistive device: Rolling walker (2 wheeled) Gait Pattern/deviations: Step-through pattern;Decreased stance time - right Gait velocity: Decreased   General Gait Details: Pt able to increase ambulation distance this afternoon. He is able to ambulate to RN station and then walk an additional 40'. Pt provided cues for symmetrical step length, increased push off with RLE, increased R knee flexion during swing, and improved R heel strike at initial contact. Pt also instructed to maintain upright posture and forward gaze. Denies DOE during ambulation. Gait speed and quality improving   Stairs            Wheelchair Mobility    Modified Rankin (Stroke Patients Only)       Balance Overall balance assessment: Needs assistance Sitting-balance support: Bilateral upper extremity supported Sitting balance-Leahy Scale: Good     Standing balance support: No upper extremity supported Standing balance-Leahy Scale: Fair                      Cognition Arousal/Alertness: Awake/alert Behavior During Therapy: WFL for tasks assessed/performed Overall Cognitive Status: Within Functional Limits for tasks assessed                      Exercises Total Joint Exercises Ankle Circles/Pumps: Strengthening;Both;Supine;15 reps Quad Sets: Strengthening;Both;Supine;15 reps Gluteal Sets: Strengthening;Both;Supine;15 reps Towel Squeeze: Strengthening;Both;Supine;15 reps Short Arc Quad: Strengthening;Right;Supine;15 reps Heel Slides: Strengthening;Right;Supine;15 reps Hip ABduction/ADduction: Strengthening;Right;Supine;15 reps Straight Leg Raises: Strengthening;Right;Supine;15 reps    General Comments  Pertinent Vitals/Pain Pain Assessment: 0-10 Pain Score: 7  Pain Location: R knee Pain  Descriptors / Indicators: Operative site guarding Pain Intervention(s): Monitored during session;Patient requesting pain meds-RN notified;Limited activity within patient's tolerance    Home Living Family/patient expects to be discharged to:: Private residence Living Arrangements: Alone Available Help at Discharge: Family Type of Home: House Home Access: Stairs to enter Entrance Stairs-Rails: Right;Left Home Layout: One level;Bed/bath upstairs Home Equipment: Environmental consultant - 2 wheels;Cane - single point;Crutches;Bedside commode;Shower seat      Prior Function Level of Independence: Independent      Comments: Independent with IADLs, Driving.   PT Goals (current goals can now be found in the care plan section) Acute Rehab PT Goals Patient Stated Goal: Decrease pain with household and family responsibilities. Daughter getting married next spring and pt wants to be able to dance PT Goal Formulation: With patient Time For Goal Achievement: 10/20/15 Potential to Achieve Goals: Good Progress towards PT goals: Progressing toward goals    Frequency  BID    PT Plan Current plan remains appropriate    Co-evaluation             End of Session Equipment Utilized During Treatment: Gait belt Activity Tolerance: Patient tolerated treatment well Patient left: with call bell/phone within reach;with SCD's reapplied;Other (comment);in bed;with bed alarm set (Polar care in place, towel roll under heel)     Time: [id number] PT Time Calculation (min) (ACUTE ONLY): 26 min  Charges:  $Gait Training: 8-22 mins $Therapeutic Exercise: 8-22 mins                    G Codes:      Lyndel Safe Huprich PT, DPT   Huprich,Jason 10/06/2015, 3:06 PM

## 2015-10-06 NOTE — Plan of Care (Signed)
Problem: Safety: Goal: Ability to remain free from injury will improve Outcome: Progressing Pt able to remain free from falls this shift.   Problem: Pain Managment: Goal: General experience of comfort will improve Outcome: Progressing Pain control with oral pain medication.  Problem: Activity: Goal: Ability to avoid complications of mobility impairment will improve Outcome: Progressing Pt able to tolerated dangling at the bedside during the evening.  Pt. Got up to bedside commode and attempted bowel movement.

## 2015-10-06 NOTE — Care Management Important Message (Signed)
Important Message  Patient Details  Name: Brandon Gibson MRN: [id number] Date of Birth: [birth year]   Medicare Important Message Given:  Yes    Shelbie Ammons, RN 10/06/2015, 11:35 AM

## 2015-10-06 NOTE — Discharge Summary (Signed)
Physician Discharge Summary  Patient ID: Brandon Gibson MRN: [id number] DOB/AGE: Apr 04, 1951 65 y.o.  Admit date: 10/05/2015 Discharge date: 10/07/2015  Admission Diagnoses:  OSTEOARTHRITIS RIGHT KNEE   Discharge Diagnoses: Patient Active Problem List   Diagnosis Date Noted  . S/P total knee arthroplasty 10/05/2015  . Lumbar stenosis with neurogenic claudication 10/04/2013    Past Medical History  Diagnosis Date  . Hypercholesteremia   . Sleep apnea     sleep study Dr. Chancy Milroy, uses CPAP  . GERD (gastroesophageal reflux disease)   . Arthritis   . H/O pleurisy   . Dysrhythmia     IRREG HEART BEAT  . Diabetes mellitus without complication ([place])   . Hypertension   . Cancer (HCC)     HX SKIN CANCER   . BPH (benign prostatic hyperplasia)   . Atrial fibrillation ([place])      Transfusion: Autovac transfusions was given his first 6 hours postoperatively   Consultants (if any):  case management for home health assistance  Discharged Condition: Improved  Hospital Course: Brandon Gibson is an 65 y.o. male who was admitted 10/05/2015 with a diagnosis of degenerative arthrosis right knee  and went to the operating room on 10/05/2015 and underwent the above named procedures.    Surgeries:Procedure(s): COMPUTER ASSISTED TOTAL KNEE ARTHROPLASTY on 10/05/2015  PRE-OPERATIVE DIAGNOSIS: Degenerative arthrosis of the right knee, primary  POST-OPERATIVE DIAGNOSIS: Same  PROCEDURE: Right total knee arthroplasty using computer-assisted navigation  SURGEON: Marciano Sequin. M.D.  ASSISTANT: Vance Peper, PA (present and scrubbed throughout the case, critical for assistance with exposure, retraction, instrumentation, and closure)  ANESTHESIA: spinal  ESTIMATED BLOOD LOSS: 50 mL  FLUIDS REPLACED: 800 mL of crystalloid  TOURNIQUET TIME: 102 minutes  DRAINS: 2 medium drains to a reinfusion system  SOFT TISSUE RELEASES: Anterior cruciate ligament, posterior cruciate ligament,  deep and superficial medial collateral ligament, patellofemoral ligament   IMPLANTS UTILIZED: DePuy Attune size 7 posterior stabilized femoral component (cemented), size 7 rotating platform tibial component (cemented), 41 mm medialized dome patella (cemented), and a 5 mm stabilized rotating platform polyethylene insert.  INDICATIONS FOR SURGERY: Brandon Gibson is a 65 y.o. year old male with a long history of progressive knee pain. X-rays demonstrated severe degenerative changes in tricompartmental fashion. The patient had not seen any significant improvement despite conservative nonsurgical intervention. After discussion of the risks and benefits of surgical intervention, the patient expressed understanding of the risks benefits and agree with plans for total knee arthroplasty.   The risks, benefits, and alternatives were discussed at length including but not limited to the risks of infection, bleeding, nerve injury, stiffness, blood clots, the need for revision surgery, cardiopulmonary complications, among others, and they were willing to proceed. Patient tolerated the surgery well. No complications .Patient was taken to PACU where she was stabilized and then transferred to the orthopedic floor.  Patient started on Xarelto 20 mg q 24 hrs. Foot pumps applied bilaterally at 80 mm hg. Heels elevated off bed with rolled towels. No evidence of DVT. Calves non tender. Negative Homan. Physical therapy was attempted on day of surgery. However due to decreased sensation was unable to be initiated. However the nursing staff did dangle his leg over the side of the bed during the night and did very well. Physical therapy started on day #1 for gait training and transfer with OT starting on  day #1 for ADL and assisted devices. Patient has done well with therapy. Ambulated greater than  200 feet upon being discharged. Patient was able to go up and down 4 steps independently and safely.  Patient's IV and Foley  were discontinued on day #1 with Hemovac being discontinued on day #2. Dressing was also changed on day #2 prior to being discharged   He was given perioperative antibiotics:  Anti-infectives    Start     Dose/Rate Route Frequency Ordered Stop   10/05/15 1300  ceFAZolin (ANCEF) IVPB 2g/100 mL premix     2 g 200 mL/hr over 30 Minutes Intravenous Every 6 hours 10/05/15 1216 10/06/15 1259   10/05/15 0625  ceFAZolin (ANCEF) 2-4 GM/100ML-% IVPB    Comments:  Rexanne Mano: cabinet override      10/05/15 0625 10/05/15 1829   10/05/15 0345  ceFAZolin (ANCEF) IVPB 2g/100 mL premix     2 g 200 mL/hr over 30 Minutes Intravenous  Once 10/05/15 0336 10/05/15 0732    .  He was fitted with AV 1 compression foot pump devices, instructed on heel pumps, early ambulation, and fitted with TED stockings bilaterally for DVT prophylaxis.  He benefited maximally from the hospital stay and there were no complications.    Recent vital signs:  Filed Vitals:   10/06/15 0037 10/06/15 0410  BP: 115/68 133/84  Pulse: 52 60  Temp: 97.9 F (36.6 C) 98 F (36.7 C)  Resp: 18 18    Recent laboratory studies:  Lab Results  Component Value Date   HGB 11.6* 10/06/2015   HGB 14.1 09/23/2015   HGB 14.8 10/23/2013   Lab Results  Component Value Date   WBC 4.6 10/06/2015   PLT 125* 10/06/2015   Lab Results  Component Value Date   INR 1.93 09/23/2015   Lab Results  Component Value Date   NA 141 10/06/2015   K 4.1 10/06/2015   CL 110 10/06/2015   CO2 26 10/06/2015   BUN 9 10/06/2015   CREATININE 0.88 10/06/2015   GLUCOSE 105* 10/06/2015    Discharge Medications:     Medication List    TAKE these medications        amiodarone 200 MG tablet  Commonly known as:  PACERONE  Take 200 mg by mouth at bedtime.     aspirin 81 MG chewable tablet  Chew 81 mg by mouth at bedtime.     hydrALAZINE 50 MG tablet  Commonly known as:  APRESOLINE  Take 50 mg by mouth 2 (two) times daily.      hydrochlorothiazide 25 MG tablet  Commonly known as:  HYDRODIURIL  Take 25 mg by mouth every morning.     metFORMIN 500 MG tablet  Commonly known as:  GLUCOPHAGE  Take 500 mg by mouth 2 (two) times daily with a meal.     methylcellulose 1 % ophthalmic solution  Commonly known as:  ARTIFICIAL TEARS  Place 1 drop into both eyes as needed (dry eyes).     metoprolol succinate 25 MG 24 hr tablet  Commonly known as:  TOPROL-XL  Take 25 mg by mouth every morning.     niacin 500 MG tablet  Take 500 mg by mouth at bedtime.     omeprazole 40 MG capsule  Commonly known as:  PRILOSEC  Take 40 mg by mouth every morning.     OSTEO BI-FLEX ADV TRIPLE ST PO  Take 2 tablets by mouth daily.     oxyCODONE 5 MG immediate release tablet  Commonly known as:  Oxy IR/ROXICODONE  Take 1-2 tablets (5-10  mg total) by mouth every 4 (four) hours as needed for severe pain or breakthrough pain.     potassium chloride 10 MEQ tablet  Commonly known as:  K-DUR  Take 10 mEq by mouth at bedtime.     rivaroxaban 20 MG Tabs tablet  Commonly known as:  XARELTO  Take 20 mg by mouth at bedtime.     rosuvastatin 20 MG tablet  Commonly known as:  CRESTOR  Take 20 mg by mouth at bedtime. Pt is to finish his bottle of Pravastatin then start the Crestor.     sucralfate 1 g tablet  Commonly known as:  CARAFATE  Take 1 g by mouth 3 (three) times daily as needed. As needed for reflux.     traMADol 50 MG tablet  Commonly known as:  ULTRAM  Take 1-2 tablets (50-100 mg total) by mouth every 4 (four) hours as needed for moderate pain.        Diagnostic Studies: Dg Knee Right Port  10/05/2015  CLINICAL DATA:  Status post right total knee arthroplasty EXAM: PORTABLE RIGHT KNEE - 1-2 VIEW COMPARISON:  None. FINDINGS: The patient is status post right total knee arthroplasty, with well-positioned right distal femoral and right proximal tibial prostheses. No right knee dislocation. No osseous fracture or suspicious focal  osseous lesion. Surgical drain terminates in the medial right knee joint. Skin staples are noted overlie the right knee in the midline anteriorly. Expected soft tissue gas and swelling surrounding the right knee joint. IMPRESSION: Satisfactory immediate postoperative appearance status post right total knee arthroplasty. Electronically Signed   By: Ilona Sorrel M.D.   On: 10/05/2015 11:37    Disposition: 01-Home or Self Care      Discharge Instructions    Diet - low sodium heart healthy    Complete by:  As directed      Increase activity slowly    Complete by:  As directed            Follow-up Information    Follow up with [facility] R[facility] PA On 10/20/2015.   Specialty:  Physician Assistant   Why:  at 8:45am   Contact information:   [address] [facility] [place] [address] [phone]       Follow up with Dereck Leep, MD On 11/17/2015.   Specialty:  Orthopedic Surgery   Why:  at 9:45am   Contact information:   [place] [address] [phone]        Signed: Watt Climes. 10/06/2015, 7:20 AM

## 2015-10-06 NOTE — Anesthesia Post-op Follow-up Note (Signed)
  Anesthesia Pain Follow-up Note  Patient: EMRY BLASE Biljon  Day #: 1  Date of Follow-up: 10/06/2015 Time: 7:20 AM  Last Vitals:  Filed Vitals:   10/06/15 0037 10/06/15 0410  BP: 115/68 133/84  Pulse: 52 60  Temp: 36.6 C 36.7 C  Resp: 18 18    Level of Consciousness: alert  Pain: mild   Side Effects:None  Catheter Site Exam: site not evaluated  Plan: D/C from anesthesia care  Blima Singer

## 2015-10-06 NOTE — Evaluation (Signed)
Physical Therapy Evaluation Patient Details Name: Brandon Gibson MRN: [id number] DOB: February 22, 1951 Today's Date: 10/06/2015   History of Present Illness  Pt underwent R TKR without reported post-op complications. Attempted evaluation on POD#0 however unable to perform due to slow return of sensory/motor function. PT evaluation performed on POD#1.  No reported falls in the last 12 months  Clinical Impression  Pt requires CGA only for bed mobility, transfers, and ambulation. He demonstrate antalgic gait but it is functional and steady. He is able to ambulate from bed to door and back to recliner. Pt able to complete all bed exercises as instructed and R knee AAROM is fair at -5 to 65 degrees. Will progress ambulation distance this afternoon and attempt to walk to the RN station and back to the room. Pt will need a rolling walker at discharge. He has a 2 story home and will need an assistive device on each level as his master bed/bath is on the second floor. He will also need HH PT. Pt will benefit from skilled PT services to address deficits in strength, balance, and mobility in order to return to full function at home.     Follow Up Recommendations Home health PT    Equipment Recommendations  Rolling walker with 5" wheels    Recommendations for Other Services       Precautions / Restrictions Precautions Precautions: Knee;Fall Precaution Booklet Issued: Yes (comment) Required Braces or Orthoses: Knee Immobilizer - Right Knee Immobilizer - Right: Discontinue once straight leg raise with < 10 degree lag Restrictions Weight Bearing Restrictions: Yes RLE Weight Bearing: Weight bearing as tolerated      Mobility  Bed Mobility Overal bed mobility: Needs Assistance Bed Mobility: Supine to Sit     Supine to sit: Min guard     General bed mobility comments: Pt requires cues for sequencing with bed mobility. Cues to lower RLE slowly to floor and scoot toward EOB  Transfers Overall  transfer level: Needs assistance Equipment used: Rolling walker (2 wheeled) Transfers: Sit to/from Stand Sit to Stand: Min guard         General transfer comment: Cues for safe hand placement and sequencing during transfers. Cues to extend R knee prior to sitting to minimize pain  Ambulation/Gait Ambulation/Gait assistance: Min guard Ambulation Distance (Feet): 40 Feet Assistive device: Rolling walker (2 wheeled) Gait Pattern/deviations: Step-to pattern;Decreased step length - left;Decreased stance time - right;Decreased weight shift to right;Antalgic Gait velocity: Decreased Gait velocity interpretation: <1.8 ft/sec, indicative of risk for recurrent falls General Gait Details: Pt able to ambulate from bed to door and back to recliner. Cues provided for proper sequencing with walker. Encouraged pt to take more weight through RLE in stance. Pt ambulates slowly but safely with good stability noted. VSS and pt denies dizziness or DOE  Financial trader Rankin (Stroke Patients Only)       Balance Overall balance assessment: Needs assistance Sitting-balance support: No upper extremity supported Sitting balance-Leahy Scale: Good     Standing balance support: Bilateral upper extremity supported Standing balance-Leahy Scale: Fair                               Pertinent Vitals/Pain Pain Assessment: 0-10 Pain Score: 7  Pain Location: R knee Pain Descriptors / Indicators: Operative site guarding Pain Intervention(s): Limited activity within patient's tolerance;Monitored during  session;RN gave pain meds during session    [place] expects to be discharged to:: Private residence Living Arrangements: Alone Available Help at Discharge: Family Type of Home: House Home Access: Stairs to enter Entrance Stairs-Rails: Psychiatric nurse of Steps: [place]: Two level;Bed/bath upstairs Home  Equipment: Welby - 2 wheels;Cane - single point;Crutches;Bedside commode;Shower seat      Prior Function Level of Independence: Independent         Comments: Full community ambulator without assistive device. Drives. L knee arthroscopy 8 weeks ago     Hand Dominance   Dominant Hand: Right    Extremity/Trunk Assessment   Upper Extremity Assessment: Overall WFL for tasks assessed           Lower Extremity Assessment: RLE deficits/detail RLE Deficits / Details: LLE appears grossly WFL. Full sensation returned to RLE. Full R DF/PF. Pt able to perform full SLR on RLE without assistance as well as full SAQ       Communication   Communication: No difficulties  Cognition Arousal/Alertness: Awake/alert Behavior During Therapy: WFL for tasks assessed/performed Overall Cognitive Status: Within Functional Limits for tasks assessed                      General Comments      Exercises Total Joint Exercises Ankle Circles/Pumps: Strengthening;Both;10 reps;Supine Quad Sets: Strengthening;Both;10 reps;Supine Gluteal Sets: Strengthening;Both;10 reps;Supine Towel Squeeze: Strengthening;Both;10 reps;Supine Short Arc Quad: Strengthening;Right;10 reps;Supine Heel Slides: Strengthening;Right;10 reps;Supine Hip ABduction/ADduction: Strengthening;Right;10 reps;Supine Straight Leg Raises: Strengthening;Right;10 reps;Supine Goniometric ROM: -5 to 65 degrees AAROM, pain limited      Assessment/Plan    PT Assessment Patient needs continued PT services  PT Diagnosis Difficulty walking;Abnormality of gait;Generalized weakness;Acute pain   PT Problem List Decreased strength;Decreased range of motion;Decreased activity tolerance;Decreased balance;Decreased mobility;Pain  PT Treatment Interventions DME instruction;Gait training;Stair training;Therapeutic activities;Functional mobility training;Therapeutic exercise;Balance training;Neuromuscular re-education;Patient/family  education;Manual techniques   PT Goals (Current goals can be found in the Care Plan section) Acute Rehab PT Goals Patient Stated Goal: Decrease pain with household and family responsibilities. Daughter getting married next spring and pt wants to be able to dance PT Goal Formulation: With patient Time For Goal Achievement: 10/20/15 Potential to Achieve Goals: Good    Frequency BID   Barriers to discharge Decreased caregiver support Lives alone but has family support    Co-evaluation               End of Session Equipment Utilized During Treatment: Gait belt Activity Tolerance: Patient tolerated treatment well Patient left: in chair;with call bell/phone within reach;with chair alarm set;with SCD's reapplied;Other (comment) (Polar care in place, towel roll under heel) Nurse Communication: Patient requests pain meds         Time: [id number] PT Time Calculation (min) (ACUTE ONLY): 30 min   Charges:   PT Evaluation $PT Eval Low Complexity: 1 Procedure PT Treatments $Therapeutic Exercise: 8-22 mins   PT G Codes:       Lyndel Safe Lola Lofaro PT, DPT   Johan Antonacci 10/06/2015, 10:18 AM

## 2015-10-06 NOTE — Discharge Instructions (Signed)

## 2015-10-06 NOTE — Anesthesia Postprocedure Evaluation (Signed)
Anesthesia Post Note  Patient: [name]  Procedure(s) Performed: Procedure(s) (LRB): COMPUTER ASSISTED TOTAL KNEE ARTHROPLASTY (Right)  Patient location during evaluation: Nursing Unit Anesthesia Type: Spinal Level of consciousness: awake and alert and oriented Pain management: satisfactory to patient Vital Signs Assessment: post-procedure vital signs reviewed and stable Respiratory status: respiratory function stable Cardiovascular status: stable Postop Assessment: no headache, no backache, spinal receding, patient able to bend at knees, no signs of nausea or vomiting and adequate PO intake Anesthetic complications: no    Last Vitals:  Filed Vitals:   10/06/15 0037 10/06/15 0410  BP: 115/68 133/84  Pulse: 52 60  Temp: 36.6 C 36.7 C  Resp: 18 18    Last Pain:  Filed Vitals:   10/06/15 0612  PainSc: 3                  Blima Singer

## 2015-10-06 NOTE — Evaluation (Signed)
Occupational Therapy Evaluation Patient Details Name: Brandon Gibson MRN: [id number] DOB: [birth year] Today's Date: 10/06/2015    History of Present Illness Pt underwent R TKR without reported post-op complications.     Clinical Impression    Pt. Is a 64 y.o. Male who was admitted for a Right TKR. Pt presents with limited ROM, Pain, weakness, and impaired functional mobility which hinder her ability to complete ADL and IADL tasks. Pt. could benefit from skilled OT services to review A/E use for LE ADLs, to review necessary home modifications, and to improve functional mobility for ADL/IADLs in order to work towards regaining Independence with ADL/IADLs.          Follow Up Recommendations  No OT follow up    Equipment Recommendations       Recommendations for Other Services PT consult     Precautions / Restrictions Precautions Precautions: Knee Precaution Booklet Issued: Yes (comment) Required Braces or Orthoses: Knee Immobilizer - Right Knee Immobilizer - Right: Discontinue once straight leg raise with < 10 degree lag Restrictions Weight Bearing Restrictions: Yes RLE Weight Bearing: Weight bearing as tolerated      Mobility Bed Mobility   Balance                              ADL Overall ADL's : Needs assistance/impaired Eating/Feeding: Set up   Grooming: Set up           Upper Body Dressing : Minimal assistance                   Functional mobility during ADLs: Minimal assistance       Vision     Perception     Praxis      Pertinent Vitals/Pain Pain Assessment: 0-10 Pain Score: 8  Pain Location: Right Knee Pain Descriptors / Indicators: Operative site guarding Pain Intervention(s): Limited activity within patient's tolerance     Hand Dominance Right   Extremity/Trunk Assessment Upper Extremity Assessment Upper Extremity Assessment: Overall WFL for tasks assessed     Communication Communication Communication: No  difficulties   Cognition Arousal/Alertness: Awake/alert Behavior During Therapy: WFL for tasks assessed/performed Overall Cognitive Status: Within Functional Limits for tasks assessed                     General Comments       Exercises Exercises: Total Joint     Shoulder Instructions      Home Living Family/patient expects to be discharged to:: Private residence Living Arrangements: Alone Available Help at Discharge: Family Type of Home: House Home Access: Stairs to enter Technical brewer of Steps: 4 Entrance Stairs-Rails: Right;Left Home Layout: One level;Bed/bath upstairs Alternate Level Stairs-Number of Steps: 14 Alternate Level Stairs-Rails: Right Bathroom Shower/Tub: Walk-in shower         Home Equipment: Environmental consultant - 2 wheels;Cane - single point;Crutches;Bedside commode;Shower seat          Prior Functioning/Environment Level of Independence: Independent        Comments: Independent with IADLs, Driving.    OT Diagnosis: Generalized weakness   OT Problem List: Decreased strength;Pain;Decreased activity tolerance;Decreased range of motion;Decreased knowledge of use of DME or AE   OT Treatment/Interventions:      OT Goals(Current goals can be found in the care plan section) Acute Rehab OT Goals Patient Stated Goal: Decrease pain with household and family responsibilities. Daughter getting married next spring and pt wants  to be able to dance  OT Frequency:     Barriers to D/C:            Co-evaluation              End of Session Equipment Utilized During Treatment: Gait belt  Activity Tolerance: Patient tolerated treatment well Patient left: in chair;with call bell/phone within reach;with chair alarm set   Time: 1015-1045 OT Time Calculation (min): 30 min Charges:  OT General Charges $OT Visit: 1 Procedure OT Evaluation $OT Eval Moderate Complexity: 1 Procedure OT Treatments $Self Care/Home Management : 8-22 mins G-Codes:     Harrel Carina, MS, OTR/L Harrel Carina 10/06/2015, 11:20 AM

## 2015-10-07 LAB — CBC
HEMATOCRIT: 35 % — AB (ref 40.0–52.0)
HEMOGLOBIN: 11.9 g/dL — AB (ref 13.0–18.0)
MCH: 29.6 pg (ref 26.0–34.0)
MCHC: 33.9 g/dL (ref 32.0–36.0)
MCV: 87.4 fL (ref 80.0–100.0)
Platelets: 141 10*3/uL — ABNORMAL LOW (ref 150–440)
RBC: 4 MIL/uL — ABNORMAL LOW (ref 4.40–5.90)
RDW: 16.1 % — AB (ref 11.5–14.5)
WBC: 6 10*3/uL (ref 3.8–10.6)

## 2015-10-07 LAB — BASIC METABOLIC PANEL
ANION GAP: 10 (ref 5–15)
BUN: 9 mg/dL (ref 6–20)
CALCIUM: 8 mg/dL — AB (ref 8.9–10.3)
CHLORIDE: 103 mmol/L (ref 101–111)
CO2: 23 mmol/L (ref 22–32)
Creatinine, Ser: 0.79 mg/dL (ref 0.61–1.24)
GFR calc non Af Amer: >60 mL/min (ref >60)
GLUCOSE: 157 mg/dL — AB (ref 65–99)
Potassium: 3.4 mmol/L — ABNORMAL LOW (ref 3.5–5.1)
Sodium: 136 mmol/L (ref 135–145)

## 2015-10-07 LAB — GLUCOSE, CAPILLARY
GLUCOSE-CAPILLARY: 148 mg/dL — AB (ref 65–99)
GLUCOSE-CAPILLARY: 143 mg/dL — AB (ref 65–99)
Glucose-Capillary: 110 mg/dL — ABNORMAL HIGH (ref 65–99)

## 2015-10-07 NOTE — Care Management (Signed)
Spoke with patient to discuss discharge plan for today. He said he never picked a home health agency and that his walker was old and he wanted a new one. He has asked for a list of agencies which he states was not provided on Monday. I have requested rolling walker from [facility]. This RNCM will provide list of agencies and update them as he requests.

## 2015-10-07 NOTE — Progress Notes (Signed)
Physical Therapy Treatment Patient Details Name: Brandon Gibson MRN: [id number] DOB: 1950-06-10 Today's Date: 10/07/2015    History of Present Illness Pt underwent R TKR without reported post-op complications. Attempted evaluation on POD#0 however unable to perform due to slow return of sensory/motor function. PT evaluation performed on POD#1.  No reported falls in the last 12 months    PT Comments    Pt does well with therapy on this date. He continues to ambulate with R antalgic gait pattern but gait normalizes throughout distance with cues. Pt able to ambulate to rehab gym and then complete a full lap around RN station. At end of ambulation pt returns to upright at EOB and reports feeling sweaty, shaky, and lightheaded. RN notified and vitals obtain. VSS without evidence of hypotension. Blood glucose is not low. Pt offerred orange juice but declines. Pt returns to supine and reports symptoms gradually impove. Pt elects to complete exercises in supine. AAROM measures deferred to PM session. Discussed with RN and patient about afternoon session before assessing plans for discharge today. Patient is in agreement. Pt also complains of blood in urine and RN is made aware. Will continue to progress ambulation this afternoon as well as exercises. Repeat stairs as necessary per patient needs. Pt will benefit from skilled PT services to address deficits in strength, balance, and mobility in order to return to full function at home.    Follow Up Recommendations  Home health PT     Equipment Recommendations  Rolling walker with 5" wheels    Recommendations for Other Services       Precautions / Restrictions Precautions Precautions: Knee Precaution Booklet Issued: Yes (comment) Required Braces or Orthoses: Knee Immobilizer - Right Knee Immobilizer - Right: Discontinue once straight leg raise with < 10 degree lag Restrictions Weight Bearing Restrictions: Yes RLE Weight Bearing: Weight  bearing as tolerated    Mobility  Bed Mobility Overal bed mobility: Needs Assistance Bed Mobility: Supine to Sit;Sit to Supine     Supine to sit: Supervision Sit to supine: Supervision   General bed mobility comments: Pt continues to demonstrate improving speed and sequencing. Pt not requiring assistance with bed mobility at this time. Demonstrates good R hip flexion and R knee extension strength when returning to bed  Transfers Overall transfer level: Needs assistance Equipment used: Rolling walker (2 wheeled) Transfers: Sit to/from Stand Sit to Stand: Min guard         General transfer comment: Pt demonstrating improving weight shift to RLE. He continues to limit R knee flexion during transfers. Pt with safe hand placement and good stability with UE support in standing  Ambulation/Gait Ambulation/Gait assistance: Min guard Ambulation Distance (Feet): 275 Feet Assistive device: Rolling walker (2 wheeled) Gait Pattern/deviations: Step-through pattern;Decreased step length - left;Decreased stance time - right;Decreased weight shift to right;Antalgic Gait velocity: Decreased Gait velocity interpretation: Below normal speed for age/gender General Gait Details: Pt continues to ambulate with R antalgic gait pattern. Encouraged pt to decrease UE reliance, improve upright posture, and increased R push off at terminal stance. Gait normalizes throughout distance. Pt able to ambulate to rehab gym and then complete a full lap around RN station. At end of ambulation pt returns to upright at EOB and reports feeling sweaty, shaky, and lightheaded. RN notified and vitals obtain. VSS without evidence of hyptension. Pt is not hypoglycemic. Offerred orange juice but declines. Pt returns to supine and reports symptoms gradually impove. Pt completes additional exercises in supine.  Stairs Stairs: Yes Stairs assistance: Min guard Stair Management: One rail Right;Two rails;Step to  pattern;Sideways;Forwards;With crutches Number of Stairs: 4 General stair comments: Stair training performed with patient. He is able to ascend/descend 4 stairs 3 times. First time is with bilateral rails, second time is with R railing only, and third time is with R railing and axillary crutch on L. Pt demonstrates good safety and stability during all stair training. He is able to recall proper sequencing and only requires minimal cues for safe completing. Pt reports feeling confident about performing stairs at home.   Wheelchair Mobility    Modified Rankin (Stroke Patients Only)       Balance Overall balance assessment: Needs assistance Sitting-balance support: No upper extremity supported Sitting balance-Leahy Scale: Good     Standing balance support: No upper extremity supported Standing balance-Leahy Scale: Fair                      Cognition Arousal/Alertness: Awake/alert Behavior During Therapy: WFL for tasks assessed/performed Overall Cognitive Status: Within Functional Limits for tasks assessed                      Exercises Total Joint Exercises Short Arc Quad: Strengthening;Right;Supine;10 reps Heel Slides: Strengthening;Right;Supine;10 reps Hip ABduction/ADduction: Strengthening;Right;Supine;10 reps (Performed abducation and adduction x 10 in sitting) Straight Leg Raises: Strengthening;Right;Supine;10 reps Goniometric ROM: Deferred to PM session due to pt feeling unwell in sitting    General Comments        Pertinent Vitals/Pain Pain Assessment: 0-10 Pain Score: 5  Pain Location: R knee Pain Intervention(s): Monitored during session;Limited activity within patient's tolerance    Home Living                      Prior Function            PT Goals (current goals can now be found in the care plan section) Acute Rehab PT Goals Patient Stated Goal: Decrease pain with household and family responsibilities. Daughter getting married  next spring and pt wants to be able to dance PT Goal Formulation: With patient Time For Goal Achievement: 10/20/15 Potential to Achieve Goals: Good Progress towards PT goals: Progressing toward goals    Frequency  BID    PT Plan Current plan remains appropriate    Co-evaluation             End of Session Equipment Utilized During Treatment: Gait belt Activity Tolerance: Patient tolerated treatment well;Other (comment) (Mildly limited by feeling lightheaded and unwell) Patient left: with call bell/phone within reach;with SCD's reapplied;Other (comment);in bed;with bed alarm set (Polar care in place, towel roll under heel)     Time: [id number] PT Time Calculation (min) (ACUTE ONLY): 42 min  Charges:  $Gait Training: 23-37 mins $Therapeutic Exercise: 8-22 mins                    G Codes:      Brandon Gibson PT, DPT   Brandon Gibson 10/07/2015, 12:41 PM

## 2015-10-07 NOTE — Progress Notes (Addendum)
Physical Therapy Treatment Patient Details Name: Brandon Gibson MRN: [id number] DOB: 1950/05/16 Today's Date: 10/07/2015    History of Present Illness Pt underwent R TKR without reported post-op complications. Attempted evaluation on POD#0 however unable to perform due to slow return of sensory/motor function. PT evaluation performed on POD#1.  No reported falls in the last 12 months    PT Comments    Pt demonstrates ability to complete lap around RN station as well as complete stair training again. He has none of the symptoms of lightheadedness or sweating that he experienced this morning. AAROM is progressing nicely and pt able to complete standing exercises at EOB. Pt has met all goals and is safe to discharge at this time from a physical therapy standpoint. RN notified. Pt will benefit from skilled PT services to address deficits in strength, balance, and mobility in order to return to full function at home.   Follow Up Recommendations  Home health PT     Equipment Recommendations  Rolling walker with 5" wheels    Recommendations for Other Services       Precautions / Restrictions Precautions Precautions: Knee Precaution Booklet Issued: Yes (comment) Required Braces or Orthoses: Knee Immobilizer - Right Knee Immobilizer - Right: Discontinue once straight leg raise with < 10 degree lag Restrictions Weight Bearing Restrictions: Yes RLE Weight Bearing: Weight bearing as tolerated    Mobility  Bed Mobility Overal bed mobility: Independent Bed Mobility: Supine to Sit;Sit to Supine     Supine to sit: Supervision Sit to supine: Supervision   General bed mobility comments: Pt independent with all bed mobility at this time  Transfers Overall transfer level: Needs assistance Equipment used: Rolling walker (2 wheeled) Transfers: Sit to/from Stand Sit to Stand: Supervision         General transfer comment: Pt able to perform sit to stand with safe hand placement. Able  to come to standing without UE support once upright. Fair balance and no evidence for instability  Ambulation/Gait Ambulation/Gait assistance: Min guard Ambulation Distance (Feet): 275 Feet Assistive device: Rolling walker (2 wheeled) Gait Pattern/deviations: Step-through pattern Gait velocity: Decreased Gait velocity interpretation: Below normal speed for age/gender General Gait Details: Pt initiates ambulation with step-through pattern. Speed and step length improved from this morning. Pt demonstrates improved R push off and improved R knee flexion during swing. Vitals obtained after ambulation and are WNL. Pt denies DOE, sweating, or lightheadedness. Cues for upright posture and decreased UE reliance   Stairs Stairs: Yes Stairs assistance: Min guard Stair Management: One rail Right;With crutches;Step to pattern Number of Stairs: 4 General stair comments: Pt repeats stair training with crutch under LUE and rail support on RUE. He is able to safely ascend/descend stairs without evidence for instability. Pt feels confident completing stairs at home  Wheelchair Mobility    Modified Rankin (Stroke Patients Only)       Balance Overall balance assessment: Needs assistance Sitting-balance support: No upper extremity supported Sitting balance-Leahy Scale: Good     Standing balance support: No upper extremity supported Standing balance-Leahy Scale: Fair                      Cognition Arousal/Alertness: Awake/alert Behavior During Therapy: WFL for tasks assessed/performed Overall Cognitive Status: Within Functional Limits for tasks assessed                      Exercises Total Joint Exercises  Hip ABduction/ADduction: Strengthening;Right;10 reps;Standing (Performed abducation  and adduction x 10 in sitting) Straight Leg Raises: Strengthening;Right;10 reps;Standing Long Arc Quad: Strengthening;Right;10 reps;Seated Knee Flexion: Strengthening;Right;10  reps;Seated Goniometric ROM: -2 to 83 degrees AAROM, pain limited Marching in Standing: Strengthening;Both;10 reps;Standing Other Exercises Other Exercises: Standing mini squats x 10     General Comments        Pertinent Vitals/Pain Pain Assessment: 0-10 Pain Score: 4  Pain Location: R knee Pain Intervention(s): Monitored during session    Home Living                      Prior Function            PT Goals (current goals can now be found in the care plan section) Acute Rehab PT Goals Patient Stated Goal: Decrease pain with household and family responsibilities. Daughter getting married next spring and pt wants to be able to dance PT Goal Formulation: With patient Time For Goal Achievement: 10/20/15 Potential to Achieve Goals: Good Progress towards PT goals: Progressing toward goals    Frequency  BID    PT Plan Current plan remains appropriate    Co-evaluation             End of Session Equipment Utilized During Treatment: Gait belt Activity Tolerance: Patient tolerated treatment well Patient left: with call bell/phone within reach;with SCD's reapplied;Other (comment);in bed;with bed alarm set (Polar care in place, towel roll under heel)     Time: 3943-2003 PT Time Calculation (min) (ACUTE ONLY): 24 min  Charges:  $Gait Training: 8-22 mins $Therapeutic Exercise: 8-22 mins                    G Codes:      Brandon Gibson PT, DPT   Brandon Gibson 10/07/2015, 1:42 PM

## 2015-10-07 NOTE — Progress Notes (Signed)
Pt c\o small amount of blood in urine. Urine looked unremarkable but he showed me a tissue he used to wipe there was a scant amount of blood noted. I explained that sometimes during catheterization some trauma can occur. Will cont to monitor. He also c/o being light headed and diaphoretic after his PT session states he thought his sugar was dropping. BS in the mid 140's BP and pulse WDL. Stated he felt better about 10 min after he complained. Will monitor after his second therapy session.

## 2015-10-07 NOTE — Progress Notes (Signed)
Occupational Therapy Treatment Patient Details Name: Brandon Gibson MRN: [id number] DOB: October 05, 1950 Today's Date: 10/07/2015    History of present illness Pt underwent R TKR without reported post-op complications. Pt lives at home alone. No reported falls in the last 12 months   OT comments  Met with patient after PT called therapist indicating he had questions about bathroom set up and showering. He has a U shaped shower stall upstairs and uses a commode seat to sit on and the upper railing of shower to hold onto when transferring in and out of shower, which is a safety concern.  Rec that he only sponge bathe until staples are removed and not wrap it in saran wrap and tape as he was suggesting.  Rec using a BSC over toilet downstairs to increase safety and prevent falls, especially since he lives at home alone and his daughter lives in [place].  Discussed using a bread bag with bottom cut out for donning ted hose and reacher for picking up items off the floor.  Reviewed adaptive equip catalog with rec for other items like LH sponge and walker bag.  Pt would benefit from OT [facility] for continued education and training in AE and safe bathroom set up at home for ADLs.  Spoke to Emma from [place] and she is to call Dr Marry Guan to obtain order for OT [facility].  Follow Up Recommendations  Home health OT    Equipment Recommendations  3 in 1 bedside comode (LH sponge and BSC over toilet downstairs)    Recommendations for Other Services      Precautions / Restrictions Precautions Precautions: Knee Precaution Booklet Issued: Yes (comment) Required Braces or Orthoses: Knee Immobilizer - Right Knee Immobilizer - Right: Discontinue once straight leg raise with < 10 degree lag Restrictions Weight Bearing Restrictions: Yes RLE Weight Bearing: Weight bearing as tolerated       Mobility Bed Mobility Overal bed mobility: Independent Bed Mobility: Supine to Sit;Sit to Supine     Supine to sit: Supervision Sit  to supine: Supervision   General bed mobility comments: Pt independent with all bed mobility at this time  Transfers Overall transfer level: Needs assistance Equipment used: Rolling walker (2 wheeled) Transfers: Sit to/from Stand Sit to Stand: Supervision         General transfer comment: Pt able to perform sit to stand with safe hand placement. Able to come to standing without UE support once upright. Fair balance and no evidence for instability    Balance Overall balance assessment: Needs assistance Sitting-balance support: No upper extremity supported Sitting balance-Leahy Scale: Good     Standing balance support: No upper extremity supported Standing balance-Leahy Scale: Fair                     ADL Overall ADL's : Needs assistance/impaired                                       General ADL Comments: Pt requires min assist using AE for LB dressing and uses a commode seat for a chair in a U shaped shower stall which is concerning since he said he holds onto top of shower rail to steady himself.  Rec using a BSC over toilet even though it is higher so he has rails to hold onto and push up from for transfers.  Rec OT HH to address safety with  set up for toileting and showering since he lives alone.      Vision                     Perception     Praxis      Cognition   Behavior During Therapy: WFL for tasks assessed/performed Overall Cognitive Status: Within Functional Limits for tasks assessed                       Extremity/Trunk Assessment               Exercises Total Joint Exercises Short Arc Quad: Strengthening;Right;Supine;10 reps Heel Slides: Strengthening;Right;Supine;10 reps Hip ABduction/ADduction: Strengthening;Right;10 reps;Standing (Performed abducation and adduction x 10 in sitting) Straight Leg Raises: Strengthening;Right;10 reps;Standing Long Arc Quad: Strengthening;Right;10 reps;Seated Knee Flexion:  Strengthening;Right;10 reps;Seated Goniometric ROM: -2 to 83 degrees AAROM, pain limited Marching in Standing: Strengthening;Both;10 reps;Standing Other Exercises Other Exercises: Standing mini squats x 10    Shoulder Instructions       General Comments      Pertinent Vitals/ Pain       Pain Assessment: 0-10 Pain Score: 4  Pain Location: R knee Pain Intervention(s): Monitored during session  Home Living                                          Prior Functioning/Environment              Frequency Min 1X/week     Progress Toward Goals  OT Goals(current goals can now be found in the care plan section)  Progress towards OT goals: Progressing toward goals  Acute Rehab OT Goals Patient Stated Goal: "to go home and get therapy there"  Plan Discharge plan remains appropriate    Co-evaluation                 End of Session     Activity Tolerance Patient tolerated treatment well   Patient Left in bed;with call bell/phone within reach;with bed alarm set;with nursing/sitter in room   Nurse Communication          Time: 1330-1406 OT Time Calculation (min): 36 min  Charges: OT General Charges $OT Visit: 1 Procedure OT Treatments $Self Care/Home Management : 23-37 mins   Chrys Racer, OTR/L ascom [phone] 10/07/2015, 2:13 PM

## 2015-10-07 NOTE — Care Management (Signed)
Met with patient and he states that he doesn't recall CM visit very well "because he was just out of surgery waking up". He would like to use [facility] for HHPT. Rolling walker to be delivered prior to discharge today from [facility]. I have cancelled home health through [facility]. No further RNCM needs. Case closed.

## 2015-10-07 NOTE — Care Management (Signed)
Notified by OT that patient would benefit from it at home also. I have order [place] for this patient per Dr. Marry Guan with [facility]. Patient has no co-pays for visits per Tim with [facility]. Case closed.

## 2015-10-07 NOTE — Progress Notes (Signed)
   Subjective: 2 Days Post-Op Procedure(s) (LRB): COMPUTER ASSISTED TOTAL KNEE ARTHROPLASTY (Right) Patient reports pain as 5 on 0-10 scale.   Patient is well, and has had no acute complaints or problems Continue with physical therapy today.  Plan is to go Home after hospital stay. no nausea and no vomiting Patient denies any chest pains or shortness of breath. Objective: Vital signs in last 24 hours: Temp:  [97.5 F (36.4 C)-98.6 F (37 C)] 98.6 F (37 C) (06/07 0427) Pulse Rate:  [46-70] 66 (06/07 0427) Resp:  [16-18] 16 (06/07 0427) BP: (124-155)/(60-99) 145/71 mmHg (06/07 0427) SpO2:  [97 %-99 %] 97 % (06/07 0427) well approximated incision Heels are non tender and elevated off the bed using rolled towels Intake/Output from previous day: 06/06 0701 - 06/07 0700 In: 720 [P.O.:720] Out: 1205 [Urine:925; Drains:280] Intake/Output this shift:     Recent Labs  10/06/15 0533 10/07/15 0547  HGB 11.6* 11.9*    Recent Labs  10/06/15 0533 10/07/15 0547  WBC 4.6 6.0  RBC 3.89* 4.00*  HCT 34.6* 35.0*  PLT 125* 141*    Recent Labs  10/06/15 0533 10/07/15 0547  NA 141 136  K 4.1 3.4*  CL 110 103  CO2 26 23  BUN 9 9  CREATININE 0.88 0.79  GLUCOSE 105* 157*  CALCIUM 8.0* 8.0*   No results for input(s): LABPT, INR in the last 72 hours.  EXAM General - Patient is Alert, Appropriate and Oriented Extremity - Neurologically intact Neurovascular intact Sensation intact distally Intact pulses distally Dorsiflexion/Plantar flexion intact Compartment soft Dressing - dressing C/D/I Motor Function - intact, moving foot and toes well on exam.    Past Medical History  Diagnosis Date  . Hypercholesteremia   . Sleep apnea     sleep study Dr. Chancy Milroy, uses CPAP  . GERD (gastroesophageal reflux disease)   . Arthritis   . H/O pleurisy   . Dysrhythmia     IRREG HEART BEAT  . Diabetes mellitus without complication ([place])   . Hypertension   . Cancer (HCC)     HX SKIN  CANCER   . BPH (benign prostatic hyperplasia)   . Atrial fibrillation (HCC)     Assessment/Plan: 2 Days Post-Op Procedure(s) (LRB): COMPUTER ASSISTED TOTAL KNEE ARTHROPLASTY (Right) Active Problems:   S/P total knee arthroplasty  Estimated body mass index is 33.58 kg/(m^2) as calculated from the following:   Height as of this encounter: 5\' 10"  (1.778 m).   Weight as of this encounter: 106.142 kg (234 lb). Up with therapy Discharge home with home health  Labs: reviewed DVT Prophylaxis - Xarelto, Foot Pumps and TED hose Weight-Bearing as tolerated to right leg Please change dressing prior to d/c this pm  Rickayla Wieland R. [place] 10/07/2015, 7:07 AM

## 2016-04-05 DIAGNOSIS — R0602 Shortness of breath: Secondary | ICD-10-CM | POA: Insufficient documentation

## 2016-06-15 ENCOUNTER — Encounter: Payer: Self-pay | Admitting: Urology

## 2016-06-15 ENCOUNTER — Ambulatory Visit: Payer: Medicare PPO | Admitting: Urology

## 2016-06-15 VITALS — BP 138/71 | HR 51 | Ht 70.0 in | Wt 240.0 lb

## 2016-06-15 DIAGNOSIS — N401 Enlarged prostate with lower urinary tract symptoms: Secondary | ICD-10-CM

## 2016-06-15 DIAGNOSIS — R350 Frequency of micturition: Secondary | ICD-10-CM | POA: Diagnosis not present

## 2016-06-15 MED ORDER — TAMSULOSIN HCL 0.4 MG PO CAPS: 0.4000 mg | ORAL_CAPSULE | Freq: Every day | ORAL | 11 refills | Status: DC

## 2016-06-15 NOTE — Progress Notes (Signed)
06/15/2016 3:46 PM   Gurley 26-Apr-1951 [id number]  Referring provider: Perrin Maltese, MD [address] [place], [place] [address]  Chief Complaint  Patient presents with  . New Patient (Initial Visit)    BPH    HPI: I was consulted to assess the patient's voiding dysfunction worse over a few years. He reports a poor flow with hesitancy. Sometimes he'll lean forward and strain to help urination. He does not stop and start. He can feel empty but then double void a small amount or void one hour later. He may have had side effects from prostate treatments by Dr.Stoioff years ago.  He voids every 2 hours during the day and once or twice at night.  He is on oral hypoglycemics and has had 2 neck operations and 2 lower back operations  He reports decreased libido. His erections are less firm  He denies a history of previous GU surgery kidney stones and urinary tract infections  Modifying factors: There are no other modifying factors  Associated signs and symptoms: There are no other associated signs and symptoms Aggravating and relieving factors: There are no other aggravating or relieving factors Severity: Moderate Duration: Persistent     PMH: Past Medical History:  Diagnosis Date  . Arthritis   . Atrial fibrillation ([place])   . BPH (benign prostatic hyperplasia)   . Cancer (HCC)    HX SKIN CANCER   . Diabetes mellitus without complication ([place])   . Dysrhythmia    IRREG HEART BEAT  . GERD (gastroesophageal reflux disease)   . H/O pleurisy   . Hypercholesteremia   . Hypertension   . Sleep apnea    sleep study Dr. Chancy Milroy, uses CPAP    Surgical History: Past Surgical History:  Procedure Laterality Date  . ANTERIOR CERVICAL DECOMP/DISCECTOMY FUSION  11/25/2011   Procedure: ANTERIOR CERVICAL DECOMPRESSION/DISCECTOMY FUSION 2 LEVELS;  Surgeon: Floyce Stakes, MD;  Location: [facility];  Service: Neurosurgery;  Laterality: N/A;  Cervical four-five,Cervical  five-six  Anterior cervical decompression/diskectomy, fusion, plate  . BREAST SURGERY     lumpectomy  . CARDIAC CATHETERIZATION     2011, [facility]  . CARDIOVASCULAR STRESS TEST  2011  . CERVICAL FUSION     C 6/7   . EYE SURGERY     LASIK  . KNEE ARTHROPLASTY Right 10/05/2015   Procedure: COMPUTER ASSISTED TOTAL KNEE ARTHROPLASTY;  Surgeon: Dereck Leep, MD;  Location: [facility] ORS;  Service: Orthopedics;  Laterality: Right;  . KNEE ARTHROSCOPY     Right  . KNEE ARTHROSCOPY Left 08/10/2015   Procedure: LEFT KNEE ARTHROSCOPY, CHONDROPLASTY, MEDIAL MENISECTOMY;  Surgeon: Dereck Leep, MD;  Location: [facility] ORS;  Service: Orthopedics;  Laterality: Left;  . LUMBAR LAMINECTOMY/DECOMPRESSION MICRODISCECTOMY N/A 10/04/2013   Procedure: LUMBAR TWO TO THREE LUMBAR LAMINECTOMY/DECOMPRESSION MICRODISCECTOMY 1 LEVEL;  Surgeon: Floyce Stakes, MD;  Location: [facility];  Service: Neurosurgery;  Laterality: N/A;  L2-3 Laminectomy  . POSTERIOR LAMINECTOMY / DECOMPRESSION LUMBAR SPINE    . TRANSESOPHAGEAL ECHOCARDIOGRAM  2011    Home Medications:  Allergies as of 06/15/2016      Reactions   Morphine And Related Anaphylaxis   Ace Inhibitors Swelling      Medication List       Accurate as of 06/15/16  3:46 PM. Always use your most recent med list.          amiodarone 200 MG tablet Commonly known as:  PACERONE Take 200 mg by mouth  at bedtime.   aspirin 81 MG chewable tablet Chew 81 mg by mouth at bedtime.   diazepam 5 MG tablet Commonly known as:  VALIUM Take 5 mg by mouth at bedtime as needed for anxiety.   hydrALAZINE 50 MG tablet Commonly known as:  APRESOLINE Take 50 mg by mouth 2 (two) times daily.   hydrochlorothiazide 25 MG tablet Commonly known as:  HYDRODIURIL Take 25 mg by mouth every morning.   metFORMIN 500 MG tablet Commonly known as:  GLUCOPHAGE Take 500 mg by mouth 2 (two) times daily with a meal.   methylcellulose 1 % ophthalmic solution Commonly known as:  ARTIFICIAL  TEARS Place 1 drop into both eyes as needed (dry eyes).   metoprolol succinate 25 MG 24 hr tablet Commonly known as:  TOPROL-XL Take 25 mg by mouth every morning.   niacin 500 MG tablet Take 500 mg by mouth at bedtime.   omeprazole 40 MG capsule Commonly known as:  PRILOSEC Take 40 mg by mouth every morning.   OSTEO BI-FLEX ADV TRIPLE ST PO Take 2 tablets by mouth daily.   oxyCODONE 5 MG immediate release tablet Commonly known as:  Oxy IR/ROXICODONE Take 1-2 tablets (5-10 mg total) by mouth every 4 (four) hours as needed for severe pain or breakthrough pain.   potassium chloride 10 MEQ tablet Commonly known as:  K-DUR Take 10 mEq by mouth at bedtime.   rivaroxaban 20 MG Tabs tablet Commonly known as:  XARELTO Take 20 mg by mouth at bedtime.   rosuvastatin 20 MG tablet Commonly known as:  CRESTOR Take 20 mg by mouth at bedtime. Pt is to finish his bottle of Pravastatin then start the Crestor.   sucralfate 1 g tablet Commonly known as:  CARAFATE Take 1 g by mouth 3 (three) times daily as needed. As needed for reflux.   traMADol 50 MG tablet Commonly known as:  ULTRAM Take 1-2 tablets (50-100 mg total) by mouth every 4 (four) hours as needed for moderate pain.       Allergies:  Allergies  Allergen Reactions  . Morphine And Related Anaphylaxis  . Ace Inhibitors Swelling    Family History: Family History  Problem Relation Age of Onset  . Prostate cancer Neg Hx   . Chronic Renal Failure Neg Hx     Social History:  reports that he has never smoked. He has never used smokeless tobacco. He reports that he drinks about 8.4 oz of alcohol per week . He reports that he does not use drugs.  ROS: UROLOGY Frequent Urination?: Yes Hard to postpone urination?: Yes Burning/pain with urination?: Yes Get up at night to urinate?: Yes Leakage of urine?: No Urine stream starts and stops?: No Trouble starting stream?: Yes Do you have to strain to urinate?: Yes Blood in  urine?: No Urinary tract infection?: No Sexually transmitted disease?: No Injury to kidneys or bladder?: No Painful intercourse?: No Weak stream?: Yes Erection problems?: Yes Penile pain?: No  Gastrointestinal Nausea?: No Vomiting?: No Indigestion/heartburn?: No Diarrhea?: No Constipation?: No  Constitutional Fever: No Night sweats?: No Weight loss?: No Fatigue?: Yes  Skin Skin rash/lesions?: Yes Itching?: Yes  Eyes Blurred vision?: Yes Double vision?: No  Ears/Nose/Throat Sore throat?: No Sinus problems?: Yes  Hematologic/Lymphatic Swollen glands?: No Easy bruising?: Yes  Cardiovascular Leg swelling?: No Chest pain?: No  Respiratory Cough?: No Shortness of breath?: No  Endocrine Excessive thirst?: No  Musculoskeletal Back pain?: Yes Joint pain?: Yes  Neurological Headaches?: Yes Dizziness?: No  Psychologic Depression?: No Anxiety?: No  Physical Exam: BP 138/71   Pulse (!) 51   Ht 5\' 10"  (1.778 m)   Wt 108.9 kg (240 lb)   BMI 34.44 kg/m   Constitutional:  Alert and oriented, No acute distress. HEENT: [place] AT, moist mucus membranes.  Trachea midline, no masses. Cardiovascular: No clubbing, cyanosis, or edema. Respiratory: Normal respiratory effort, no increased work of breathing. GI: Abdomen is soft, nontender, nondistended, no abdominal masses GU: No CVA tenderness. He had mild atrophy of both testicles but no masses. He had a 40-50 g benign prostate Skin: No rashes, bruises or suspicious lesions. Lymph: No cervical or inguinal adenopathy. Neurologic: Grossly intact, no focal deficits, moving all 4 extremities. Psychiatric: Normal mood and affect.  Laboratory Data: Lab Results  Component Value Date   WBC 6.0 10/07/2015   HGB 11.9 (L) 10/07/2015   HCT 35.0 (L) 10/07/2015   MCV 87.4 10/07/2015   PLT 141 (L) 10/07/2015    Lab Results  Component Value Date   CREATININE 0.79 10/07/2015    No results found for: PSA  No results  found for: TESTOSTERONE  Lab Results  Component Value Date   HGBA1C 5.3 09/23/2015    Urinalysis    Component Value Date/Time   COLORURINE YELLOW (A) 09/23/2015 0824   APPEARANCEUR CLEAR (A) 09/23/2015 0824   LABSPEC 1.017 09/23/2015 0824   PHURINE 6.0 09/23/2015 0824   GLUCOSEU NEGATIVE 09/23/2015 0824   HGBUR NEGATIVE 09/23/2015 0824   BILIRUBINUR NEGATIVE 09/23/2015 0824   KETONESUR NEGATIVE 09/23/2015 0824   PROTEINUR NEGATIVE 09/23/2015 0824   NITRITE NEGATIVE 09/23/2015 0824   LEUKOCYTESUR NEGATIVE 09/23/2015 0824    Pertinent Imaging: None  Assessment & Plan:  The patient has decreased flow with milder frequency and nocturia. He may have had some libido issues in the past when treated by his urologist with prostate medication. Having said that I thought it was very reasonable to try an alpha blocker.  I gave him a Flomax prescription and hopefully he will not have side effects. We will discuss testing and quality of life next time if he fails. I gave him Viagra 100 mg samples and he will try it. Side effects were discussed. He does not take nitroglycerin.  By history he tried Cialis once a day and it may have helped his erections a bit but did not help his voiding dysfunction.  He currently is in a relationship. Other options for erectile dysfunction may be [place] in the future  He is from [place]  1. Benign prostatic hyperplasia with urinary frequency 2. Erectile dysfunction 3. Urinary frequency - Bladder Scan (Post Void Residual) in office   No Follow-up on file.  Reece Packer, MD  [facility] [address], [place], [place] [address] [phone]

## 2016-06-16 LAB — URINALYSIS, COMPLETE
BILIRUBIN UA: NEGATIVE
Glucose, UA: NEGATIVE
KETONES UA: NEGATIVE
NITRITE UA: NEGATIVE
Protein, UA: NEGATIVE
RBC UA: NEGATIVE
SPEC GRAV UA: 1.02 (ref 1.005–1.030)
UUROB: 0.2 mg/dL (ref 0.2–1.0)
pH, UA: 5 (ref 5.0–7.5)

## 2016-06-16 LAB — MICROSCOPIC EXAMINATION
Bacteria, UA: NONE SEEN
EPITHELIAL CELLS (NON RENAL): NONE SEEN /HPF (ref 0–10)
RBC MICROSCOPIC, UA: NONE SEEN /HPF (ref 0–?)

## 2016-07-11 ENCOUNTER — Ambulatory Visit: Payer: Medicare PPO | Admitting: Urology

## 2016-07-13 ENCOUNTER — Other Ambulatory Visit: Payer: Self-pay

## 2016-07-13 DIAGNOSIS — N401 Enlarged prostate with lower urinary tract symptoms: Secondary | ICD-10-CM

## 2016-07-13 DIAGNOSIS — R350 Frequency of micturition: Principal | ICD-10-CM

## 2016-07-13 MED ORDER — TAMSULOSIN HCL 0.4 MG PO CAPS: 0.4000 mg | ORAL_CAPSULE | Freq: Every day | ORAL | 11 refills | Status: DC

## 2016-07-18 ENCOUNTER — Ambulatory Visit: Payer: Medicare PPO | Admitting: Urology

## 2016-07-18 ENCOUNTER — Encounter: Payer: Self-pay | Admitting: Urology

## 2016-07-18 VITALS — BP 130/75 | HR 55 | Ht 70.0 in | Wt 241.9 lb

## 2016-07-18 DIAGNOSIS — R350 Frequency of micturition: Secondary | ICD-10-CM | POA: Diagnosis not present

## 2016-07-18 NOTE — Progress Notes (Signed)
07/18/2016 3:23 PM   Summers 1950-12-28 [id number]  Referring provider: Perrin Maltese, MD [address] [place], [place] [address]  Chief Complaint  Patient presents with  . Benign Prostatic Hypertrophy    HPI: I was consulted to assess the patient's voiding dysfunction worse over a few years. He reports a poor flow with hesitancy. Sometimes he'll lean forward and strain to help urination. He does not stop and start. He can feel empty but then double void a small amount or void one hour later. He may have had side effects from prostate treatments by Dr.Stoioff years ago.  He voids every 2 hours during the day and once or twice at night. He reports decreased libido. His erections are less firm  The patient has decreased flow with milder frequency and nocturia. He may have had some libido issues in the past when treated by his urologist with prostate medication. Having said that I thought it was very reasonable to try an alpha blocker.  I gave him a Flomax prescription and hopefully he will not have side effects. We will discuss testing and quality of life next time if he fails. I gave him Viagra 100 mg samples and he will try it.   By history he tried Cialis once a day and it may have helped his erections a bit but did not help his voiding dysfunction.  Today        PMH: Past Medical History:  Diagnosis Date  . Arthritis   . Atrial fibrillation ([place])   . BPH (benign prostatic hyperplasia)   . Cancer (HCC)    HX SKIN CANCER   . Diabetes mellitus without complication ([place])   . Dysrhythmia    IRREG HEART BEAT  . GERD (gastroesophageal reflux disease)   . H/O pleurisy   . Hypercholesteremia   . Hypertension   . Sleep apnea    sleep study Dr. Chancy Milroy, uses CPAP    Surgical History: Past Surgical History:  Procedure Laterality Date  . ANTERIOR CERVICAL DECOMP/DISCECTOMY FUSION  11/25/2011   Procedure: ANTERIOR CERVICAL DECOMPRESSION/DISCECTOMY FUSION 2  LEVELS;  Surgeon: Floyce Stakes, MD;  [facility];  Service: Neurosurgery;  Laterality: N/A;  Cervical four-five,Cervical five-six  Anterior cervical decompression/diskectomy, fusion, plate  . BREAST SURGERY     lumpectomy  . CARDIAC CATHETERIZATION     2011, [facility]  . CARDIOVASCULAR STRESS TEST  2011  . CERVICAL FUSION     C 6/7   . EYE SURGERY     LASIK  . KNEE ARTHROPLASTY Right 10/05/2015   Procedure: COMPUTER ASSISTED TOTAL KNEE ARTHROPLASTY;  Surgeon: Dereck Leep, MD;  Location: [facility] ORS;  Service: Orthopedics;  Laterality: Right;  . KNEE ARTHROSCOPY     Right  . KNEE ARTHROSCOPY Left 08/10/2015   Procedure: LEFT KNEE ARTHROSCOPY, CHONDROPLASTY, MEDIAL MENISECTOMY;  Surgeon: Dereck Leep, MD;  Location: [facility] ORS;  Service: Orthopedics;  Laterality: Left;  . LUMBAR LAMINECTOMY/DECOMPRESSION MICRODISCECTOMY N/A 10/04/2013   Procedure: LUMBAR TWO TO THREE LUMBAR LAMINECTOMY/DECOMPRESSION MICRODISCECTOMY 1 LEVEL;  Surgeon: Floyce Stakes, MD;  Location: [facility];  Service: Neurosurgery;  Laterality: N/A;  L2-3 Laminectomy  . POSTERIOR LAMINECTOMY / DECOMPRESSION LUMBAR SPINE    . TRANSESOPHAGEAL ECHOCARDIOGRAM  2011    Home Medications:  Allergies as of 07/18/2016      Reactions   Morphine And Related Anaphylaxis   Ace Inhibitors Swelling      Medication List  Accurate as of 07/18/16  3:23 PM. Always use your most recent med list.          amiodarone 200 MG tablet Commonly known as:  PACERONE Take 200 mg by mouth at bedtime.   aspirin 81 MG chewable tablet Chew 81 mg by mouth at bedtime.   diazepam 5 MG tablet Commonly known as:  VALIUM Take 5 mg by mouth at bedtime as needed for anxiety.   hydrALAZINE 50 MG tablet Commonly known as:  APRESOLINE Take 50 mg by mouth 2 (two) times daily.   hydrochlorothiazide 25 MG tablet Commonly known as:  HYDRODIURIL Take 25 mg by mouth every morning.   metFORMIN 500 MG tablet Commonly known as:   GLUCOPHAGE Take 500 mg by mouth 2 (two) times daily with a meal.   methylcellulose 1 % ophthalmic solution Commonly known as:  ARTIFICIAL TEARS Place 1 drop into both eyes as needed (dry eyes).   metoprolol succinate 25 MG 24 hr tablet Commonly known as:  TOPROL-XL Take 25 mg by mouth every morning.   niacin 500 MG tablet Take 500 mg by mouth at bedtime.   omeprazole 40 MG capsule Commonly known as:  PRILOSEC Take 40 mg by mouth every morning.   OSTEO BI-FLEX ADV TRIPLE ST PO Take 2 tablets by mouth daily.   oxyCODONE 5 MG immediate release tablet Commonly known as:  Oxy IR/ROXICODONE Take 1-2 tablets (5-10 mg total) by mouth every 4 (four) hours as needed for severe pain or breakthrough pain.   potassium chloride 10 MEQ tablet Commonly known as:  K-DUR Take 10 mEq by mouth at bedtime.   rivaroxaban 20 MG Tabs tablet Commonly known as:  XARELTO Take 20 mg by mouth at bedtime.   rosuvastatin 20 MG tablet Commonly known as:  CRESTOR Take 20 mg by mouth at bedtime. Pt is to finish his bottle of Pravastatin then start the Crestor.   sucralfate 1 g tablet Commonly known as:  CARAFATE Take 1 g by mouth 3 (three) times daily as needed. As needed for reflux.   tamsulosin 0.4 MG Caps capsule Commonly known as:  FLOMAX Take 1 capsule (0.4 mg total) by mouth daily.   traMADol 50 MG tablet Commonly known as:  ULTRAM Take 1-2 tablets (50-100 mg total) by mouth every 4 (four) hours as needed for moderate pain.       Allergies:  Allergies  Allergen Reactions  . Morphine And Related Anaphylaxis  . Ace Inhibitors Swelling    Family History: Family History  Problem Relation Age of Onset  . Prostate cancer Neg Hx   . Chronic Renal Failure Neg Hx     Social History:  reports that he has never smoked. He has never used smokeless tobacco. He reports that he drinks about 8.4 oz of alcohol per week . He reports that he does not use drugs.  ROS: UROLOGY Frequent  Urination?: Yes Hard to postpone urination?: No Burning/pain with urination?: No Get up at night to urinate?: Yes Leakage of urine?: No Urine stream starts and stops?: Yes Trouble starting stream?: Yes Do you have to strain to urinate?: Yes Blood in urine?: No Urinary tract infection?: No Sexually transmitted disease?: No Injury to kidneys or bladder?: No Painful intercourse?: No Weak stream?: Yes Erection problems?: Yes Penile pain?: No  Gastrointestinal Nausea?: No Vomiting?: No Indigestion/heartburn?: No Diarrhea?: No Constipation?: No  Constitutional Fever: No Night sweats?: No Weight loss?: No Fatigue?: No  Skin Skin rash/lesions?: No Itching?: No  Eyes Blurred vision?: No Double vision?: No  Ears/Nose/Throat Sore throat?: No Sinus problems?: No  Hematologic/Lymphatic Swollen glands?: No Easy bruising?: Yes  Cardiovascular Leg swelling?: No Chest pain?: No  Respiratory Cough?: No Shortness of breath?: No  Endocrine Excessive thirst?: No  Musculoskeletal Back pain?: No Joint pain?: No  Neurological Headaches?: No Dizziness?: No  Psychologic Depression?: No Anxiety?: No  Physical Exam: BP 130/75 (BP Location: Left Arm, Patient Position: Sitting, Cuff Size: Large)   Pulse (!) 55   Ht 5\' 10"  (1.778 m)   Wt 109.7 kg (241 lb 14.4 oz)   BMI 34.71 kg/m   Constitutional:  Alert and oriented, No acute distress.  Laboratory Data: Lab Results  Component Value Date   WBC 6.0 10/07/2015   HGB 11.9 (L) 10/07/2015   HCT 35.0 (L) 10/07/2015   MCV 87.4 10/07/2015   PLT 141 (L) 10/07/2015    Lab Results  Component Value Date   CREATININE 0.79 10/07/2015    No results found for: PSA  No results found for: TESTOSTERONE  Lab Results  Component Value Date   HGBA1C 5.3 09/23/2015    Urinalysis    Component Value Date/Time   COLORURINE YELLOW (A) 09/23/2015 0824   APPEARANCEUR Clear 06/15/2016 1550   LABSPEC 1.017 09/23/2015 0824     PHURINE 6.0 09/23/2015 0824   GLUCOSEU Negative 06/15/2016 [place] 09/23/2015 0824   BILIRUBINUR Negative 06/15/2016 [place] 09/23/2015 0824   PROTEINUR Negative 06/15/2016 1550   PROTEINUR NEGATIVE 09/23/2015 0824   NITRITE Negative 06/15/2016 1550   NITRITE NEGATIVE 09/23/2015 0824   LEUKOCYTESUR Trace (A) 06/15/2016 1550    Pertinent Imaging: none  Assessment & Plan:  Patient has a bit stronger strain with the once a day Flomax. There was no change in his frequency. He still can stop and start a little bit with post void dribbling. I talked about dumping the dosage. He never tried the Viagra because he broke off of his girlfriend  I will see him on 0.8 mg in 1 month. I went through urodynamics and cystoscopy versus quality life discussion with him today. We may or may not pursue other options depend upon his treatment goals   There are no diagnoses linked to this encounter.  Return in about 4 weeks (around 08/15/2016).  Reece Packer, MD  [facility] [address], [place], [place] [address] [phone]

## 2016-08-15 ENCOUNTER — Ambulatory Visit: Payer: Medicare PPO

## 2016-08-23 ENCOUNTER — Ambulatory Visit: Payer: Medicare PPO

## 2016-09-12 ENCOUNTER — Ambulatory Visit: Payer: Medicare PPO | Admitting: Urology

## 2016-10-17 ENCOUNTER — Encounter: Payer: Medicare PPO | Attending: Internal Medicine | Admitting: Dietician

## 2016-10-17 ENCOUNTER — Encounter: Payer: Self-pay | Admitting: Dietician

## 2016-10-17 ENCOUNTER — Encounter: Payer: Self-pay | Admitting: Urology

## 2016-10-17 ENCOUNTER — Ambulatory Visit (INDEPENDENT_AMBULATORY_CARE_PROVIDER_SITE_OTHER): Payer: Medicare PPO | Admitting: Urology

## 2016-10-17 VITALS — BP 123/73 | HR 62 | Ht 70.0 in | Wt 238.6 lb

## 2016-10-17 VITALS — Ht 70.0 in | Wt 239.5 lb

## 2016-10-17 DIAGNOSIS — E119 Type 2 diabetes mellitus without complications: Secondary | ICD-10-CM | POA: Diagnosis present

## 2016-10-17 DIAGNOSIS — Z6834 Body mass index (BMI) 34.0-34.9, adult: Secondary | ICD-10-CM

## 2016-10-17 DIAGNOSIS — E6609 Other obesity due to excess calories: Secondary | ICD-10-CM

## 2016-10-17 DIAGNOSIS — N529 Male erectile dysfunction, unspecified: Secondary | ICD-10-CM | POA: Diagnosis not present

## 2016-10-17 MED ORDER — SILDENAFIL CITRATE 20 MG PO TABS: 20.0000 mg | ORAL_TABLET | ORAL | 2 refills | Status: DC

## 2016-10-17 NOTE — Patient Instructions (Signed)
   Reduce portions of starchy foods with meals -- try using a smaller plate, start with smaller portions and allow for more only if not satisfied and allow adequate time for your stomach to feel full.   Drink a glass of water before eating to begin filling your stomach.   Incorporate low-carb veggies with a starch to stretch the starch portion more.   Keep up your regular exercise, great job!

## 2016-10-17 NOTE — Progress Notes (Signed)
10/17/2016 2:25 PM   [place] 11/04/1950 [id number]  Referring provider: Perrin Maltese, MD [place], [place] [address]  Chief Complaint  Patient presents with  . Benign Prostatic Hypertrophy    follow up    HPI: I was consulted to assess the patient's voiding dysfunction worse over a few years. He reports a poor flow with hesitancy. Sometimes he'll lean forward and strain to help urination. He does not stop and start. He can feel empty but then double void a small amount or void one hour later.   He voids every 2 hours during the day and once or twice at night. He reports decreased libido. His erections are less firm  The patient has decreased flow with milder frequency and nocturia.   I gave him a Flomax prescription and hopefully he will not have side effects. We will discuss testing and quality of life next time if he fails. I gave him Viagra 100 mg samples and he will try it.   By history he tried Cialis once a day and it may have helped his erections a bit but did not help his voiding dysfunction. When I saw the patient last time the stream was a bit better on Flomax. He had not tried Viagra since he was not sexually active. We talked about urodynamics and cystoscopy and I increased a Flomax of 0.8 mg  Today Frequency is stable     PMH: Past Medical History:  Diagnosis Date  . Arthritis   . Atrial fibrillation ([place])   . BPH (benign prostatic hyperplasia)   . Cancer (HCC)    HX SKIN CANCER   . Diabetes mellitus without complication ([place])   . Dysrhythmia    IRREG HEART BEAT  . GERD (gastroesophageal reflux disease)   . H/O pleurisy   . Hypercholesteremia   . Hypertension   . Sleep apnea    sleep study Dr. Chancy Milroy, uses CPAP    Surgical History: Past Surgical History:  Procedure Laterality Date  . ANTERIOR CERVICAL DECOMP/DISCECTOMY FUSION  11/25/2011   Procedure: ANTERIOR CERVICAL DECOMPRESSION/DISCECTOMY FUSION 2 LEVELS;  Surgeon:  Floyce Stakes, MD;  Location: [facility];  Service: Neurosurgery;  Laterality: N/A;  Cervical four-five,Cervical five-six  Anterior cervical decompression/diskectomy, fusion, plate  . BREAST SURGERY     lumpectomy  . CARDIAC CATHETERIZATION     2011, [facility]  . CARDIOVASCULAR STRESS TEST  2011  . CERVICAL FUSION     C 6/7   . EYE SURGERY     LASIK  . KNEE ARTHROPLASTY Right 10/05/2015   Procedure: COMPUTER ASSISTED TOTAL KNEE ARTHROPLASTY;  Surgeon: Dereck Leep, MD;  Location: [facility] ORS;  Service: Orthopedics;  Laterality: Right;  . KNEE ARTHROSCOPY     Right  . KNEE ARTHROSCOPY Left 08/10/2015   Procedure: LEFT KNEE ARTHROSCOPY, CHONDROPLASTY, MEDIAL MENISECTOMY;  Surgeon: Dereck Leep, MD;  Location: [facility] ORS;  Service: Orthopedics;  Laterality: Left;  . LUMBAR LAMINECTOMY/DECOMPRESSION MICRODISCECTOMY N/A 10/04/2013   Procedure: LUMBAR TWO TO THREE LUMBAR LAMINECTOMY/DECOMPRESSION MICRODISCECTOMY 1 LEVEL;  Surgeon: Floyce Stakes, MD;  Location: [facility];  Service: Neurosurgery;  Laterality: N/A;  L2-3 Laminectomy  . POSTERIOR LAMINECTOMY / DECOMPRESSION LUMBAR SPINE    . TRANSESOPHAGEAL ECHOCARDIOGRAM  2011    Home Medications:  Allergies as of 10/17/2016      Reactions   Morphine And Related Anaphylaxis   Ace Inhibitors Swelling      Medication List  Accurate as of 10/17/16  2:25 PM. Always use your most recent med list.          amiodarone 200 MG tablet Commonly known as:  PACERONE Take 200 mg by mouth at bedtime.   aspirin 81 MG chewable tablet Chew 81 mg by mouth at bedtime.   diazepam 5 MG tablet Commonly known as:  VALIUM Take 0.5 mg by mouth at bedtime as needed for anxiety.   furosemide 20 MG tablet Commonly known as:  LASIX   hydrALAZINE 50 MG tablet Commonly known as:  APRESOLINE Take 50 mg by mouth 2 (two) times daily.   hydrochlorothiazide 25 MG tablet Commonly known as:  HYDRODIURIL Take 25 mg by mouth every morning.   metFORMIN  500 MG tablet Commonly known as:  GLUCOPHAGE Take 500 mg by mouth 2 (two) times daily with a meal.   methylcellulose 1 % ophthalmic solution Commonly known as:  ARTIFICIAL TEARS Place 1 drop into both eyes as needed (dry eyes).   metoprolol succinate 25 MG 24 hr tablet Commonly known as:  TOPROL-XL Take 25 mg by mouth every morning.   niacin 500 MG tablet Take 500 mg by mouth at bedtime.   omeprazole 40 MG capsule Commonly known as:  PRILOSEC Take 40 mg by mouth every morning.   oxyCODONE 5 MG immediate release tablet Commonly known as:  Oxy IR/ROXICODONE Take 1-2 tablets (5-10 mg total) by mouth every 4 (four) hours as needed for severe pain or breakthrough pain.   potassium chloride 10 MEQ tablet Commonly known as:  K-DUR Take 10 mEq by mouth at bedtime.   rivaroxaban 20 MG Tabs tablet Commonly known as:  XARELTO Take 20 mg by mouth at bedtime.   rosuvastatin 20 MG tablet Commonly known as:  CRESTOR Take 20 mg by mouth at bedtime. Pt is to finish his bottle of Pravastatin then start the Crestor.   sucralfate 1 g tablet Commonly known as:  CARAFATE Take 1 g by mouth 3 (three) times daily as needed. As needed for reflux.   tamsulosin 0.4 MG Caps capsule Commonly known as:  FLOMAX Take 1 capsule (0.4 mg total) by mouth daily.       Allergies:  Allergies  Allergen Reactions  . Morphine And Related Anaphylaxis  . Ace Inhibitors Swelling    Family History: Family History  Problem Relation Age of Onset  . Prostate cancer Neg Hx   . Chronic Renal Failure Neg Hx     Social History:  reports that he has never smoked. He has never used smokeless tobacco. He reports that he drinks about 8.4 oz of alcohol per week . He reports that he does not use drugs.  ROS:                                        Physical Exam: BP 123/73 (BP Location: Left Arm, Patient Position: Sitting, Cuff Size: Large)   Pulse 62   Ht 5\' 10"  (1.778 m)   Wt 108.2  kg (238 lb 9.6 oz)   BMI 34.24 kg/m   Constitutional:  Alert and oriented, No acute distress.  Laboratory Data: Lab Results  Component Value Date   WBC 6.0 10/07/2015   HGB 11.9 (L) 10/07/2015   HCT 35.0 (L) 10/07/2015   MCV 87.4 10/07/2015   PLT 141 (L) 10/07/2015    Lab Results  Component Value Date  CREATININE 0.79 10/07/2015    No results found for: PSA  No results found for: TESTOSTERONE  Lab Results  Component Value Date   HGBA1C 5.3 09/23/2015    Urinalysis    Component Value Date/Time   COLORURINE YELLOW (A) 09/23/2015 0824   APPEARANCEUR Clear 06/15/2016 1550   LABSPEC 1.017 09/23/2015 0824   PHURINE 6.0 09/23/2015 0824   GLUCOSEU Negative 06/15/2016 [place] 09/23/2015 0824   BILIRUBINUR Negative 06/15/2016 [place] 09/23/2015 0824   PROTEINUR Negative 06/15/2016 1550   PROTEINUR NEGATIVE 09/23/2015 0824   NITRITE Negative 06/15/2016 1550   NITRITE NEGATIVE 09/23/2015 0824   LEUKOCYTESUR Trace (A) 06/15/2016 1550    Pertinent Imaging: none  Assessment & Plan:  Flow improved. He is happy on 1 tablet a day. He thought 2 tablets working better but he get dizzy. He was just put on Lasix for some heart valve issues and fluid retention. I don't think he needs surgery and he agrees. Certainly with his cardiac issues we will not do anything currently. He wanted the generic Viagra prescription and 60 tablets were given. 20 mg tablets were given. I will see him in 6 months. He wanted to medication [facility] at [facility]  There are no diagnoses linked to this encounter.  No Follow-up on file.  Reece Packer, MD  [facility] [address], [place], [place] [address] [phone]

## 2016-10-17 NOTE — Progress Notes (Signed)
Medical Nutrition Therapy: Visit start time: 0930  end time: 1040  Assessment:  Diagnosis: obesity, diabetes Past medical history: HTN, HLD, sleep apnea, occasional esophageal reflux Psychosocial issues/ stress concerns: patient reports low stress level Preferred learning method:  . Auditory . Visual . Hands-on  Current weight: 239.5lbs with shoes  Height: 5'10" Medications, supplements: reconciled list in medical record  Progress and evaluation: Patient voices concern over weight gain over the past several years. He reports having made changes several years ago to manage diabetes, and has maintained his HbA1C at 6% or below since then, most recently 5.8%. He has lost about 6lbs since starting furosemide several days ago. He reports cooking for himself most days, used to own a restaurant in [place]. He also reports often having difficulty sleeping, sometimes up as late as 3am.    Physical activity: pickle ball 2 hours, 3 times a week; was swimming for 1 hour 6 times a week until recently (school children using pool during summer)  Dietary Intake:  Usual eating pattern includes 3 meals and 0-1 snacks per day. Dining out frequency: 2 meals per week.  Breakfast: boiled or fried eggs (ceramic pan), 2 slices toast and tomato, 1c. Coffee; or Kashi cereal with yogurt; cream of wheat or oatmeal Snack: occasionally nuts with low-sugar cranberries or tostitos chips or 2-3 triscuits with cheese Lunch: sandwich with deli meat and cheese, tomato; salad with chicken lite dressing;  Snack: same as am or rarely 2-3 small pieces hard candy or ice cream bar I.e. Weight watchers every 2-3 weeks; glucerna bar Supper: some red meats, now smaller portions, fish or chicken sometimes with pasta or 2 small potatoes or baked fries or wild rice blend, generous portions of veg.  Snack: wine club 2 times a week with 2 glasses wine, crackers and cheese Beverages: water, coffee in am  Nutrition Care  Education: Topics covered: weight management, diabetes Basic nutrition: basic food groups, appropriate nutrient balance    Weight control: behavioral changes for weight loss-- portion control strategies for starches and protein foods, estimated energy needs for weight loss at 1800kcal and provided review of basic meal planning allowing for 2 glasses wine daily; meal timing in regards to weight control and blood sugar control; used food models and food lists to aid instruction. Diabetes: appropriate carb intake and balance, healthy carb choices, role of regular exercise. Other: Discussed role of sleep in controlling blood sugar and weight.  Nutritional Diagnosis:  [place]-3.3 Overweight/obesity As related to excess calories.  As evidenced by BMI 34, patient report.  Intervention: Instruction as noted above.   Set goals with input from patient.    Encouraged ongoing efforts to improve sleep.   Education Materials given:  . Food lists/ Planning A Balanced Meal . Goals/ instructions   Learner/ who was taught:  . Patient   Level of understanding: [place] Kitchen Verbalizes/ demonstrates competency  Demonstrated degree of understanding via:   Teach back Learning barriers: . None  Willingness to learn/ readiness for change: . Eager, change in progress  Monitoring and Evaluation:  Dietary intake, exercise, BG control, and body weight      follow up: 11/16/16

## 2016-11-16 ENCOUNTER — Ambulatory Visit: Payer: Medicare PPO | Admitting: Dietician

## 2016-11-16 ENCOUNTER — Telehealth: Payer: Self-pay | Admitting: Dietician

## 2016-11-16 NOTE — Telephone Encounter (Signed)
Returned message from patient to reschedule his appointment, which was missed this morning. He is away from his calendar at the moment and will call the main office number later to reschedule.

## 2016-12-05 ENCOUNTER — Encounter: Payer: Medicare PPO | Attending: Internal Medicine | Admitting: Dietician

## 2016-12-05 ENCOUNTER — Encounter: Payer: Self-pay | Admitting: Dietician

## 2016-12-05 VITALS — Ht 70.0 in | Wt 246.2 lb

## 2016-12-05 DIAGNOSIS — E119 Type 2 diabetes mellitus without complications: Secondary | ICD-10-CM | POA: Insufficient documentation

## 2016-12-05 DIAGNOSIS — E6609 Other obesity due to excess calories: Secondary | ICD-10-CM

## 2016-12-05 DIAGNOSIS — Z6834 Body mass index (BMI) 34.0-34.9, adult: Secondary | ICD-10-CM

## 2016-12-05 NOTE — Progress Notes (Signed)
Medical Nutrition Therapy: Visit start time: 0930  end time: 1015  Assessment:  Diagnosis: obesity, diabetes Medical history changes: no changes Psychosocial issues/ stress concerns: patient voices frustration and concern with weight loss difficulty  Current weight: 246.2lbs  Height: 5'10" Medications, supplement changes: reconciled list in medical record  Progress and evaluation: Weight gain of 6.7lbs since previous visit on 10/17/16. Patient reports the gain is likely due to discontinuation of diuretic medication which was increasing his blood sugars. He reports difficulty decreasing food portions, especially starches, any more than he already has, because of feeling hungry soon after eating. He does occasionally have low BG symptoms during the day, which he treats with glucose tablets. His physical activity has decreased in recent months, was unable to enter pool for several weeks due to skin cancer removal, and now is feeling less motivated to resume swimming. He also reports ongoing sleep issues.   Physical activity: pickle ball, 30 minutes or more, 2 times a week.   Dietary Intake:  Usual eating pattern includes 3 meals and 0-2 snacks per day. Dining out frequency: not assessed today.  Breakfast: cereal whole grain or granola with yogurt; oatmeal or cream of wheat; biscotti with coffee; toast (sourdough) with cheese and tomato; 2 boiled eggs on toast with lite mayo and tomato Snack: occasionally nuts with low-sugar cranberries, tostitos chips, or triscuit crackers with cheese Lunch: sandwich with meat (unprocessed), cheese, tomato, lettuce greens; salad Snack: same as am, or rarely 2-3 small pieces hard candy, or weight watchers ice cream bar Supper: small-moderate portion of meat + vegetables + starch, sometimes large portions Snack: wine club 2 times a week with 2 glasses wince, crackers and cheese. Beverages: water, coffee in am, wine 2x a week  Nutrition Care Education: Topics  covered: weight management, diabetes    Weight control: non-food factors affecting weight, such as sleep, stress, exercise; reviewed food portions and strategies to control portions as well as hunger/ appetite.  Diabetes:  appropriate carb intake and balance, reviewed importance of protein sources and low-carb vegetables. Other lifestyle changes:  Discussed potential benefit of tracking food intake.   Nutritional Diagnosis:  [place]-3.3 Overweight/obesity As related to excess calories.  As evidenced by BMI 35, patient report.  Intervention: Discussion as noted above.   Updated goals with input from patient.    Encouraged continued effort to improve sleep and manage stress.    Patient will check on coverage for additional visits before scheduling; will stop in for weight check in several weeks.  Education Materials given:  [place] Kitchen Goals/ instructions  Learner/ who was taught:  . Patient   Level of understanding: [place] Kitchen Verbalizes/ demonstrates competency   Demonstrated degree of understanding via:   Teach back Learning barriers: . None  Willingness to learn/ readiness for change: . Eager, change in progress  Monitoring and Evaluation:  Dietary intake, exercise, BG control, and body weight      follow up: in several week(s)

## 2016-12-05 NOTE — Patient Instructions (Signed)
   Start with one plate of vegetables at meals; follow with a small plate of a protein source and a starch portion.   Resume more exercise, bicycle then swimming, and continue with pickle ball.   Continue efforts to manage sleep.

## 2017-02-17 ENCOUNTER — Encounter: Payer: Self-pay | Admitting: Dietician

## 2017-02-17 NOTE — Progress Notes (Signed)
Have not heard back from patient for weight check. Sent discharge letter to MD.

## 2017-03-02 IMAGING — DX DG KNEE 1-2V PORT*R*
2 series · 2 of 2 positions shown · non-contrast
Comparison: None.

CLINICAL DATA: Status post right total knee arthroplasty

EXAM:
PORTABLE RIGHT KNEE - 1-2 VIEW

[knee ap]
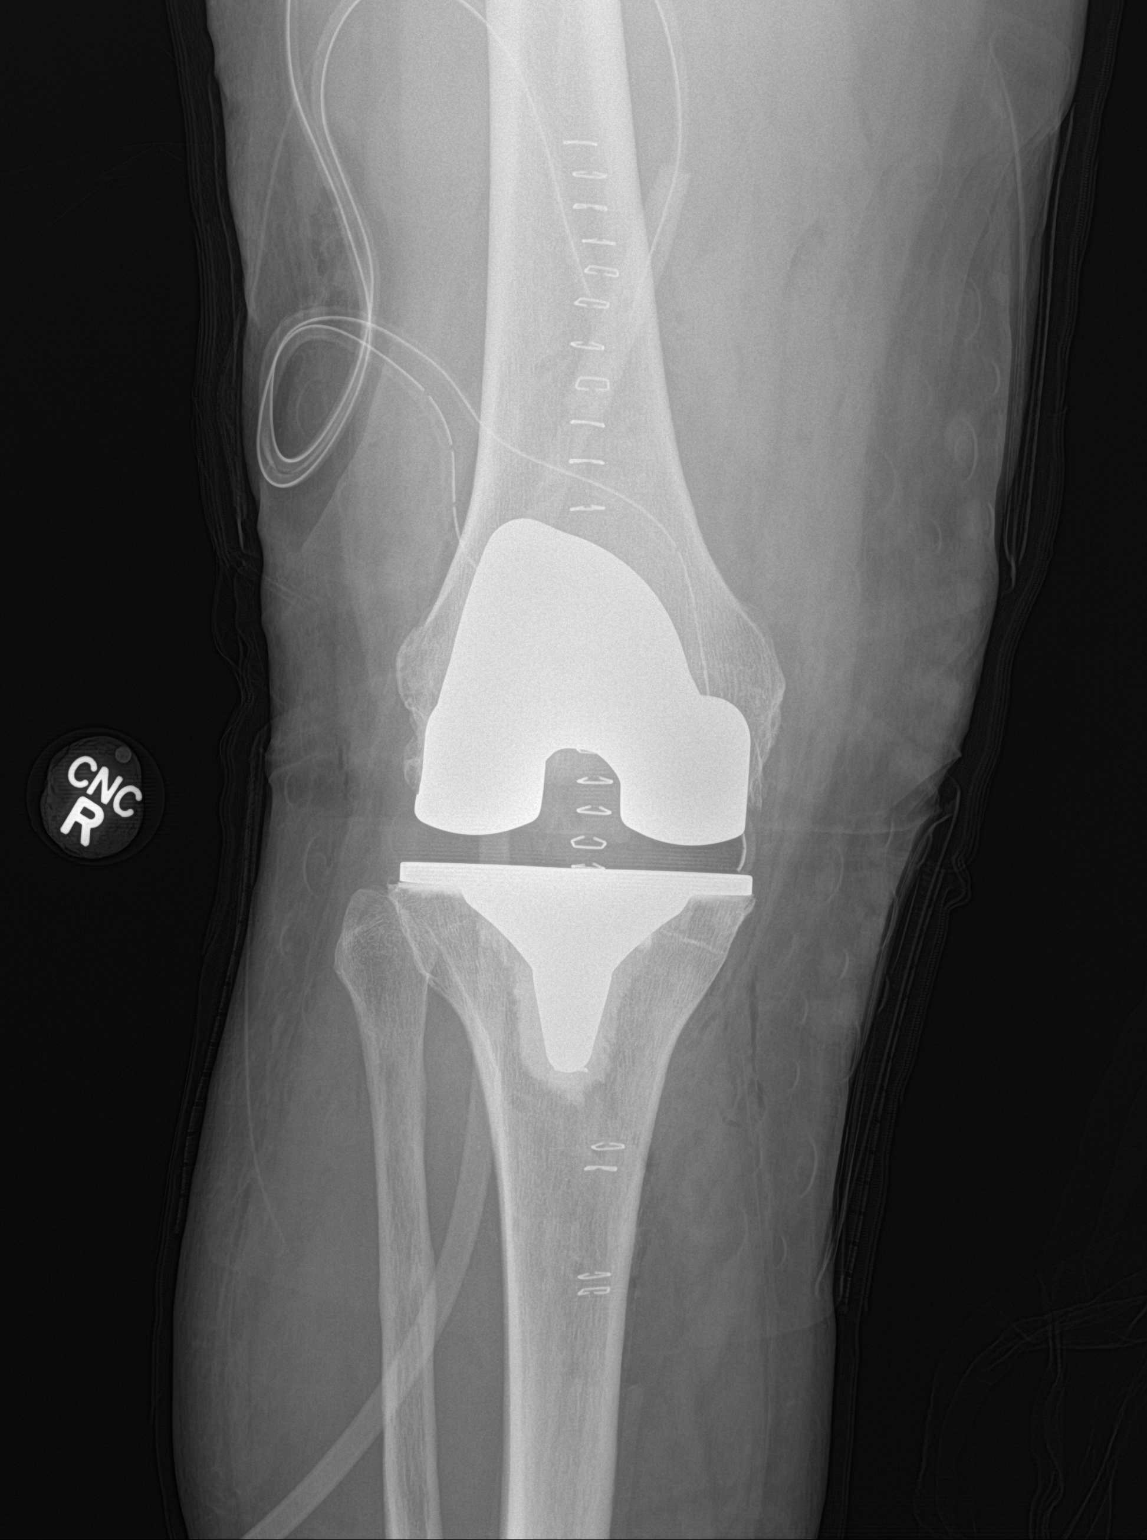

[knee lat]
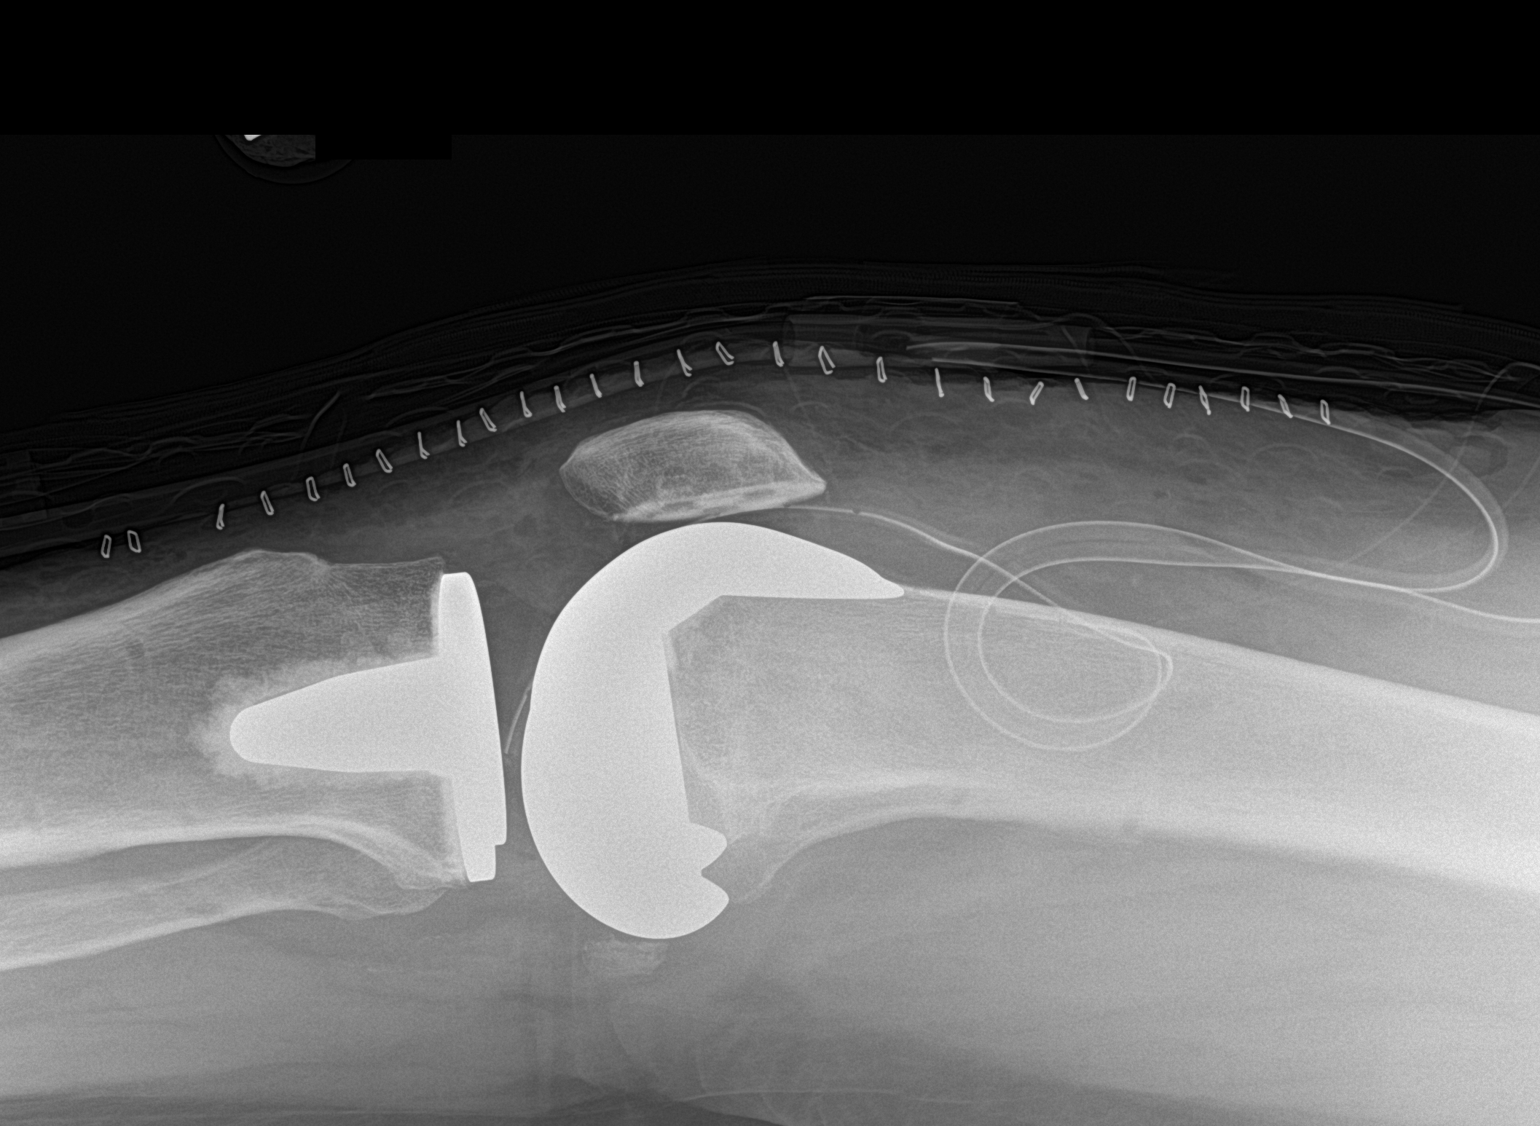

[2 of 2 positions shown; findings below may reference images not displayed]

FINDINGS: The patient is status post right total knee arthroplasty, with
well-positioned right distal femoral and right proximal tibial
prostheses. No right knee dislocation. No osseous fracture or
suspicious focal osseous lesion. Surgical drain terminates in the
medial right knee joint. Skin staples are noted overlie the right
knee in the midline anteriorly. Expected soft tissue gas and
swelling surrounding the right knee joint.
IMPRESSION: Satisfactory immediate postoperative appearance status post right
total knee arthroplasty.

## 2017-04-19 ENCOUNTER — Ambulatory Visit: Payer: Medicare PPO

## 2017-05-15 ENCOUNTER — Ambulatory Visit: Payer: Medicare PPO

## 2017-05-22 ENCOUNTER — Ambulatory Visit: Payer: Medicare HMO | Admitting: Urology

## 2017-05-22 VITALS — BP 142/74 | HR 71 | Ht 70.0 in | Wt 242.8 lb

## 2017-05-22 DIAGNOSIS — R350 Frequency of micturition: Secondary | ICD-10-CM

## 2017-05-22 NOTE — Progress Notes (Signed)
05/22/2017 3:00 PM   [place] [birth year] [id number]  Referring provider: Perrin Maltese, MD [place], [place] [address]  Chief Complaint  Patient presents with  . Erectile Dysfunction    HPI: I was consulted to assess the patient's voiding dysfunction worse over a few years. He reports a poor flow with hesitancy. Sometimes he'll lean forward and strain to help urination. He does not stop and start. He can feel empty but then double void a small amount or void one hour later.   He voids every 2 hours during the day and once or twice at night. He reports decreased libido. His erections are less firm  The patient has decreased flow with milder frequency and nocturia.   I gave him a Flomax prescription and hopefully he will not have side effects. We will discuss testing and quality of life next time if he fails. I gave him Viagra 100 mg samples and he will try it.   By history he tried Cialis once a day and it may have helped his erections a bit but did not help his voiding dysfunction. When I saw the patient last time the stream was a bit better on Flomax. He had not tried Viagra since he was not sexually active. We talked about urodynamics and cystoscopy and I increased a Flomax of 0.8 mg.  When I saw him in June the flow was improved on one Flomax and he was getting a bit dizzy on the higher dose.  He was having some heart valve issues and we did not pursue other potential invasive therapies for prostate symptoms.  We gave him generic Viagra.  Today Frequency is stable.  Flow is reasonable.  He takes one Flomax a day.  He does not want to pursue more invasive therapies.  Cardiac issues have settled down.  He failed the branded Viagra and did not take the generic.  In the last 3 or 4 months sexual function has improved with a new partner.  He does not want to pursue other options  50 g benign prostate  The role of PSA discussed.     PMH: Past Medical  History:  Diagnosis Date  . Arthritis   . Atrial fibrillation ([place])   . BPH (benign prostatic hyperplasia)   . Cancer (HCC)    HX SKIN CANCER   . Diabetes mellitus without complication ([place])   . Dysrhythmia    IRREG HEART BEAT  . GERD (gastroesophageal reflux disease)   . H/O pleurisy   . Hypercholesteremia   . Hypertension   . Sleep apnea    sleep study Dr. Chancy Milroy, uses CPAP    Surgical History: Past Surgical History:  Procedure Laterality Date  . ANTERIOR CERVICAL DECOMP/DISCECTOMY FUSION  11/25/2011   Procedure: ANTERIOR CERVICAL DECOMPRESSION/DISCECTOMY FUSION 2 LEVELS;  Surgeon: Floyce Stakes, MD;  Location: [facility];  Service: Neurosurgery;  Laterality: N/A;  Cervical four-five,Cervical five-six  Anterior cervical decompression/diskectomy, fusion, plate  . BREAST SURGERY     lumpectomy  . CARDIAC CATHETERIZATION     2011, [facility]  . CARDIOVASCULAR STRESS TEST  2011  . CERVICAL FUSION     C 6/7   . EYE SURGERY     LASIK  . KNEE ARTHROPLASTY Right 10/05/2015   Procedure: COMPUTER ASSISTED TOTAL KNEE ARTHROPLASTY;  Surgeon: Dereck Leep, MD;  Location: [facility] ORS;  Service: Orthopedics;  Laterality: Right;  . KNEE ARTHROSCOPY     Right  . KNEE  ARTHROSCOPY Left 08/10/2015   Procedure: LEFT KNEE ARTHROSCOPY, CHONDROPLASTY, MEDIAL MENISECTOMY;  Surgeon: Dereck Leep, MD;  Location: [facility] ORS;  Service: Orthopedics;  Laterality: Left;  . LUMBAR LAMINECTOMY/DECOMPRESSION MICRODISCECTOMY N/A 10/04/2013   Procedure: LUMBAR TWO TO THREE LUMBAR LAMINECTOMY/DECOMPRESSION MICRODISCECTOMY 1 LEVEL;  Surgeon: Floyce Stakes, MD;  Location: [facility];  Service: Neurosurgery;  Laterality: N/A;  L2-3 Laminectomy  . POSTERIOR LAMINECTOMY / DECOMPRESSION LUMBAR SPINE    . TRANSESOPHAGEAL ECHOCARDIOGRAM  2011    Home Medications:  Allergies as of 05/22/2017      Reactions   Morphine And Related Anaphylaxis   Ace Inhibitors Swelling      Medication List        Accurate as of  05/22/17  3:00 PM. Always use your most recent med list.          amiodarone 200 MG tablet Commonly known as:  PACERONE Take 200 mg by mouth at bedtime.   aspirin 81 MG chewable tablet Chew 81 mg by mouth at bedtime.   Fish Oil 1000 MG Caps Take by mouth.   furosemide 20 MG tablet Commonly known as:  LASIX Take 20 mg by mouth.   hydrALAZINE 50 MG tablet Commonly known as:  APRESOLINE Take 50 mg by mouth 2 (two) times daily.   hydrochlorothiazide 25 MG tablet Commonly known as:  HYDRODIURIL Take 25 mg by mouth every morning.   metFORMIN 500 MG tablet Commonly known as:  GLUCOPHAGE Take 500 mg by mouth 2 (two) times daily with a meal.   metoprolol succinate 25 MG 24 hr tablet Commonly known as:  TOPROL-XL Take 25 mg by mouth every morning.   niacin 500 MG tablet Take 500 mg by mouth at bedtime.   omeprazole 40 MG capsule Commonly known as:  PRILOSEC Take 40 mg by mouth every morning.   potassium chloride 10 MEQ tablet Commonly known as:  K-DUR Take 10 mEq by mouth at bedtime.   rivaroxaban 20 MG Tabs tablet Commonly known as:  XARELTO Take 20 mg by mouth at bedtime.   rosuvastatin 20 MG tablet Commonly known as:  CRESTOR Take 20 mg by mouth at bedtime. Pt is to finish his bottle of Pravastatin then start the Crestor.   sildenafil 20 MG tablet Commonly known as:  REVATIO Take 1 tablet (20 mg total) by mouth as directed. 3-5 tablets as needed   sucralfate 1 g tablet Commonly known as:  CARAFATE Take 1 g by mouth 3 (three) times daily as needed. As needed for reflux.   tamsulosin 0.4 MG Caps capsule Commonly known as:  FLOMAX Take 1 capsule (0.4 mg total) by mouth daily.       Allergies:  Allergies  Allergen Reactions  . Morphine And Related Anaphylaxis  . Ace Inhibitors Swelling    Family History: Family History  Problem Relation Age of Onset  . Prostate cancer Neg Hx   . Chronic Renal Failure Neg Hx     Social History:  reports that  has  never smoked. he has never used smokeless tobacco. He reports that he drinks about 8.4 oz of alcohol per week. He reports that he does not use drugs.  ROS: UROLOGY Frequent Urination?: No Hard to postpone urination?: No Burning/pain with urination?: No Get up at night to urinate?: No Leakage of urine?: Yes Urine stream starts and stops?: Yes Trouble starting stream?: Yes Do you have to strain to urinate?: Yes Blood in urine?: No Urinary tract infection?: No Sexually  transmitted disease?: No Injury to kidneys or bladder?: No Painful intercourse?: No Weak stream?: Yes Erection problems?: No Penile pain?: No  Gastrointestinal Nausea?: No Vomiting?: No Indigestion/heartburn?: No Diarrhea?: No Constipation?: No  Constitutional Fever: No Night sweats?: No Weight loss?: No Fatigue?: No  Skin Skin rash/lesions?: No Itching?: No  Eyes Blurred vision?: No Double vision?: No  Ears/Nose/Throat Sore throat?: No Sinus problems?: No  Hematologic/Lymphatic Swollen glands?: No Easy bruising?: Yes  Cardiovascular Leg swelling?: No Chest pain?: No  Respiratory Cough?: No Shortness of breath?: No  Endocrine Excessive thirst?: No  Musculoskeletal Back pain?: No Joint pain?: No  Neurological Headaches?: No Dizziness?: No  Psychologic Depression?: No Anxiety?: No  Physical Exam: BP (!) 142/74 (BP Location: Right Arm, Patient Position: Sitting, Cuff Size: Normal)   Pulse 71   Ht 5\' 10"  (1.778 m)   Wt 242 lb 12.8 oz (110.1 kg)   BMI 34.84 kg/m   Constitutional:  Alert and oriented, No acute distress.  Laboratory Data: Lab Results  Component Value Date   WBC 6.0 10/07/2015   HGB 11.9 (L) 10/07/2015   HCT 35.0 (L) 10/07/2015   MCV 87.4 10/07/2015   PLT 141 (L) 10/07/2015    Lab Results  Component Value Date   CREATININE 0.79 10/07/2015    No results found for: PSA  No results found for: TESTOSTERONE  Lab Results  Component Value Date    HGBA1C 5.3 09/23/2015    Urinalysis    Component Value Date/Time   COLORURINE YELLOW (A) 09/23/2015 0824   APPEARANCEUR Clear 06/15/2016 1550   LABSPEC 1.017 09/23/2015 0824   PHURINE 6.0 09/23/2015 0824   GLUCOSEU Negative 06/15/2016 [place] 09/23/2015 0824   BILIRUBINUR Negative 06/15/2016 [place] 09/23/2015 0824   PROTEINUR Negative 06/15/2016 1550   PROTEINUR NEGATIVE 09/23/2015 0824   NITRITE Negative 06/15/2016 1550   NITRITE NEGATIVE 09/23/2015 0824   LEUKOCYTESUR Trace (A) 06/15/2016 1550    Pertinent Imaging: None  Assessment & Plan: 90 tablets and 3 refills sent.  He gets a PSA by his primary doctor.  I will see him in 1 year.  We had a nice discussion about [place]  There are no diagnoses linked to this encounter.  No Follow-up on file.  Reece Packer, MD  [facility] [address], [place], [place] [address] [phone]

## 2017-06-12 DIAGNOSIS — M47812 Spondylosis without myelopathy or radiculopathy, cervical region: Secondary | ICD-10-CM | POA: Insufficient documentation

## 2017-06-12 DIAGNOSIS — E1165 Type 2 diabetes mellitus with hyperglycemia: Secondary | ICD-10-CM | POA: Insufficient documentation

## 2017-06-12 DIAGNOSIS — E119 Type 2 diabetes mellitus without complications: Secondary | ICD-10-CM | POA: Insufficient documentation

## 2017-07-03 ENCOUNTER — Telehealth: Payer: Self-pay | Admitting: Urology

## 2017-07-03 DIAGNOSIS — R350 Frequency of micturition: Principal | ICD-10-CM

## 2017-07-03 DIAGNOSIS — N401 Enlarged prostate with lower urinary tract symptoms: Secondary | ICD-10-CM

## 2017-07-03 NOTE — Telephone Encounter (Signed)
Pt needs refill on Tamsulosin 0.4 sent in to [facility] 90 day supply.  He will run out this week.  [phone]

## 2017-07-10 ENCOUNTER — Other Ambulatory Visit: Payer: Self-pay

## 2017-07-10 DIAGNOSIS — R350 Frequency of micturition: Principal | ICD-10-CM

## 2017-07-10 DIAGNOSIS — N401 Enlarged prostate with lower urinary tract symptoms: Secondary | ICD-10-CM

## 2017-07-10 MED ORDER — TAMSULOSIN HCL 0.4 MG PO CAPS: 0.4000 mg | ORAL_CAPSULE | Freq: Every day | ORAL | 3 refills | Status: DC

## 2017-07-10 NOTE — Addendum Note (Signed)
Addended by: Lestine Box on: 07/10/2017 04:55 PM   Modules accepted: Orders

## 2017-07-10 NOTE — Telephone Encounter (Signed)
Refills given.

## 2017-09-18 ENCOUNTER — Inpatient Hospital Stay: Payer: Medicare PPO | Attending: Oncology | Admitting: Oncology

## 2017-09-18 ENCOUNTER — Other Ambulatory Visit: Payer: Self-pay

## 2017-09-18 ENCOUNTER — Encounter: Payer: Self-pay | Admitting: Oncology

## 2017-09-18 VITALS — BP 152/83 | HR 56 | Temp 97.4°F | Resp 18 | Wt 245.1 lb

## 2017-09-18 DIAGNOSIS — M7989 Other specified soft tissue disorders: Secondary | ICD-10-CM | POA: Diagnosis not present

## 2017-09-18 DIAGNOSIS — D500 Iron deficiency anemia secondary to blood loss (chronic): Secondary | ICD-10-CM

## 2017-09-18 DIAGNOSIS — K921 Melena: Secondary | ICD-10-CM | POA: Diagnosis not present

## 2017-09-18 DIAGNOSIS — Z7901 Long term (current) use of anticoagulants: Secondary | ICD-10-CM | POA: Diagnosis not present

## 2017-09-18 DIAGNOSIS — I482 Chronic atrial fibrillation: Secondary | ICD-10-CM | POA: Diagnosis not present

## 2017-09-18 DIAGNOSIS — Z85828 Personal history of other malignant neoplasm of skin: Secondary | ICD-10-CM | POA: Diagnosis not present

## 2017-09-18 DIAGNOSIS — Z79899 Other long term (current) drug therapy: Secondary | ICD-10-CM | POA: Insufficient documentation

## 2017-09-18 DIAGNOSIS — E119 Type 2 diabetes mellitus without complications: Secondary | ICD-10-CM | POA: Diagnosis not present

## 2017-09-18 DIAGNOSIS — Z7984 Long term (current) use of oral hypoglycemic drugs: Secondary | ICD-10-CM | POA: Insufficient documentation

## 2017-09-18 HISTORY — DX: Iron deficiency anemia secondary to blood loss (chronic): D50.0

## 2017-09-18 NOTE — Progress Notes (Signed)
Hematology/Oncology Consult note [facility] [phone] [phone]   Patient Care Team: Perrin Maltese, MD as PCP - General (Internal Medicine)  REFERRING PROVIDER: Perrin Maltese, MD CHIEF COMPLAINTS/PURPOSE OF CONSULTATION:  Evaluation of low iron level  HISTORY OF PRESENTING ILLNESS:  Brandon Gibson is a  67 y.o.  male with PMH listed below who was referred to me for evaluation of low iron.  He takes Xarelto for chronic atrial fibrillation and history of unprovoked right lower extremity DVT.  He has noticed bright red blood in the stool.  His last colonoscopy was 7 to 8 years ago.  He has already had an appointment with [facility] in June. He recently had lab work done with Dr. Humphrey Rolls which showed mild anemia with hemoglobin around 11, low iron saturation, ferritin is also decreased meeting diagnostic criteria for iron deficiency anemia. Patient reports overall doing well.  No weight loss, epigastric pain, abdominal pain, fever or chills.  Review of Systems  Constitutional: Negative for chills, fever, malaise/fatigue and weight loss.  HENT: Negative for congestion, ear discharge, ear pain, nosebleeds, sinus pain and sore throat.   Eyes: Negative for double vision, photophobia, pain, discharge and redness.  Respiratory: Negative for cough, hemoptysis, sputum production, shortness of breath and wheezing.   Cardiovascular: Negative for chest pain, palpitations, orthopnea, claudication and leg swelling.  Gastrointestinal: Positive for blood in stool. Negative for abdominal pain, constipation, diarrhea, heartburn, melena, nausea and vomiting.  Genitourinary: Negative for dysuria, flank pain, frequency and hematuria.  Musculoskeletal: Negative for back pain, myalgias and neck pain.  Skin: Negative for itching and rash.  Neurological: Negative for dizziness, tingling, tremors, focal weakness, weakness and headaches.  Endo/Heme/Allergies:  Negative for environmental allergies. Does not bruise/bleed easily.  Psychiatric/Behavioral: Negative for depression and hallucinations. The patient is not nervous/anxious.     MEDICAL HISTORY:  Past Medical History:  Diagnosis Date  . Arthritis   . Atrial fibrillation ([place])   . BPH (benign prostatic hyperplasia)   . Cancer (HCC)    HX SKIN CANCER Basal cell  . Clotting disorder ([place])   . Diabetes mellitus without complication ([place])    type 2  . Dysrhythmia    IRREG HEART BEAT  . GERD (gastroesophageal reflux disease)   . H/O pleurisy   . Hypercholesteremia   . Hypertension   . Iron deficiency anemia due to chronic blood loss 09/18/2017  . Sleep apnea    sleep study Dr. Chancy Milroy, uses CPAP    SURGICAL HISTORY: Past Surgical History:  Procedure Laterality Date  . ANTERIOR CERVICAL DECOMP/DISCECTOMY FUSION  11/25/2011   Procedure: ANTERIOR CERVICAL DECOMPRESSION/DISCECTOMY FUSION 2 LEVELS;  Surgeon: Floyce Stakes, MD;  Location: [facility];  Service: Neurosurgery;  Laterality: N/A;  Cervical four-five,Cervical five-six  Anterior cervical decompression/diskectomy, fusion, plate  . BREAST SURGERY     lumpectomy  . CARDIAC CATHETERIZATION     2011, [facility]  . CARDIOVASCULAR STRESS TEST  2011  . CERVICAL FUSION     C 6/7   . EYE SURGERY     LASIK  . KNEE ARTHROPLASTY Right 10/05/2015   Procedure: COMPUTER ASSISTED TOTAL KNEE ARTHROPLASTY;  Surgeon: Dereck Leep, MD;  Location: [facility] ORS;  Service: Orthopedics;  Laterality: Right;  . KNEE ARTHROSCOPY     Right  . KNEE ARTHROSCOPY Left 08/10/2015   Procedure: LEFT KNEE ARTHROSCOPY, CHONDROPLASTY, MEDIAL MENISECTOMY;  Surgeon: Dereck Leep, MD;  Location: [facility] ORS;  Service: Orthopedics;  Laterality:  Left;  . LUMBAR LAMINECTOMY/DECOMPRESSION MICRODISCECTOMY N/A 10/04/2013   Procedure: LUMBAR TWO TO THREE LUMBAR LAMINECTOMY/DECOMPRESSION MICRODISCECTOMY 1 LEVEL;  Surgeon: Floyce Stakes, MD;  Location: [place] [facility];  Service:  Neurosurgery;  Laterality: N/A;  L2-3 Laminectomy  . POSTERIOR LAMINECTOMY / DECOMPRESSION LUMBAR SPINE    . TRANSESOPHAGEAL ECHOCARDIOGRAM  2011    SOCIAL HISTORY: Social History   Socioeconomic History  . Marital status: Widowed    Spouse name: Not on file  . Number of children: Not on file  . Years of education: Not on file  . Highest education level: Not on file  Occupational History  . Not on file  Social Needs  . Financial resource strain: Not on file  . Food insecurity:    Worry: Not on file    Inability: Not on file  . Transportation needs:    Medical: Not on file    Non-medical: Not on file  Tobacco Use  . Smoking status: Never Smoker  . Smokeless tobacco: Never Used  Substance and Sexual Activity  . Alcohol use: Yes    Alcohol/week: 8.4 oz    Types: 14 Glasses of wine per week  . Drug use: No  . Sexual activity: Not on file  Lifestyle  . Physical activity:    Days per week: Not on file    Minutes per session: Not on file  . Stress: Not on file  Relationships  . Social connections:    Talks on phone: Not on file    Gets together: Not on file    Attends religious service: Not on file    Active member of club or organization: Not on file    Attends meetings of clubs or organizations: Not on file    Relationship status: Not on file  . Intimate partner violence:    Fear of current or ex partner: Not on file    Emotionally abused: Not on file    Physically abused: Not on file    Forced sexual activity: Not on file  Other Topics Concern  . Not on file  Social History Narrative  . Not on file    FAMILY HISTORY: Family History  Problem Relation Age of Onset  . Leukemia Father   . Prostate cancer Neg Hx   . Chronic Renal Failure Neg Hx     ALLERGIES:  is allergic to morphine and related and ace inhibitors.  MEDICATIONS:  Current Outpatient Medications  Medication Sig Dispense Refill  . amiodarone (PACERONE) 200 MG tablet Take 200 mg by mouth at  bedtime.    . baclofen (LIORESAL) 10 MG tablet   2  . ferrous sulfate 325 (65 FE) MG tablet Take 325 mg by mouth daily with breakfast.    . furosemide (LASIX) 20 MG tablet Take 20 mg by mouth.    . hydrALAZINE (APRESOLINE) 50 MG tablet Take 50 mg by mouth 2 (two) times daily.    . hydrochlorothiazide (HYDRODIURIL) 25 MG tablet Take 25 mg by mouth every morning.     . meloxicam (MOBIC) 15 MG tablet Take 15 mg by mouth daily.    . metFORMIN (GLUCOPHAGE) 500 MG tablet Take 500 mg by mouth 2 (two) times daily with a meal.     . metoprolol succinate (TOPROL-XL) 25 MG 24 hr tablet Take 25 mg by mouth every morning.    . niacin 500 MG tablet Take 500 mg by mouth at bedtime.    . Omega-3 Fatty Acids (FISH OIL) 1000 MG  CAPS Take by mouth.    Marland Kitchen omeprazole (PRILOSEC) 40 MG capsule Take 40 mg by mouth every morning.     . potassium chloride (K-DUR) 10 MEQ tablet Take 10 mEq by mouth at bedtime.     . rivaroxaban (XARELTO) 20 MG TABS tablet Take 20 mg by mouth at bedtime.    . rosuvastatin (CRESTOR) 20 MG tablet Take 20 mg by mouth at bedtime. Pt is to finish his bottle of Pravastatin then start the Crestor.    . tamsulosin (FLOMAX) 0.4 MG CAPS capsule Take 1 capsule (0.4 mg total) by mouth daily. 90 capsule 3   No current facility-administered medications for this visit.      PHYSICAL EXAMINATION: ECOG PERFORMANCE STATUS: 0 - Asymptomatic Vitals:   09/18/17 0947  BP: (!) 152/83  Pulse: (!) 56  Resp: 18  Temp: (!) 97.4 F (36.3 C)   Filed Weights   09/18/17 0947  Weight: 245 lb 2 oz (111.2 kg)    Physical Exam  Constitutional: He is oriented to person, place, and time. He appears well-developed and well-nourished. No distress.  HENT:  Head: Normocephalic and atraumatic.  Right Ear: External ear normal.  Left Ear: External ear normal.  Mouth/Throat: Oropharynx is clear and moist.  Eyes: Pupils are equal, round, and reactive to light. Conjunctivae and EOM are normal. No scleral icterus.    Neck: Normal range of motion. Neck supple.  Cardiovascular: Normal rate, regular rhythm and normal heart sounds.  Pulmonary/Chest: Effort normal and breath sounds normal. No respiratory distress. He has no wheezes. He has no rales. He exhibits no tenderness.  Abdominal: Soft. Bowel sounds are normal. He exhibits no distension and no mass. There is no tenderness.  Musculoskeletal: Normal range of motion. He exhibits edema. He exhibits no deformity.  RLE swelling below knee  Lymphadenopathy:    He has no cervical adenopathy.  Neurological: He is alert and oriented to person, place, and time. No cranial nerve deficit. Coordination normal.  Skin: Skin is warm and dry. No rash noted.  Psychiatric: He has a normal mood and affect. His behavior is normal. Thought content normal.     LABORATORY DATA:  I have reviewed the data as listed Lab Results  Component Value Date   WBC 6.0 10/07/2015   HGB 11.9 (L) 10/07/2015   HCT 35.0 (L) 10/07/2015   MCV 87.4 10/07/2015   PLT 141 (L) 10/07/2015   No results for input(s): NA, K, CL, CO2, GLUCOSE, BUN, CREATININE, CALCIUM, GFRNONAA, GFRAA, PROT, ALBUMIN, AST, ALT, ALKPHOS, BILITOT, BILIDIR, IBILI in the last 8760 hours.     ASSESSMENT & PLAN:  1. Blood in stool   2. Iron deficiency anemia due to chronic blood loss   3. Swelling of right lower extremity    #For his iron deficiency anemia, I recommend IV iron. Plan IV iron with Feraheme 510mg  weekly x 2. Allergy reactions/infusion reaction including anaphylactic reaction discussed with patient. Patient voices understanding and willing to proceed. Will  give with Benadryl 25mg  PO prior to Feraheme.   # right LE swelling: he reports that he has had RLE US done and there is different opinion between PCP and his cardiologist of interpretation of the results.  I have see any results in [facility].  Will touch base with PCP's office. .   All questions were answered. The patient knows to call the clinic  with any problems questions or concerns.  Return of visit: 5 weeks.   Thank you for this kind  referral and the opportunity to participate in the care of this patient. A copy of today's note is routed to referring provider  Total face to face encounter time for this patient visit was 45 min. >50% of the time was  spent in counseling and coordination of care.    Earlie Server, MD, PhD Hematology Oncology [facility] Pager- [phone] 09/18/2017

## 2017-09-18 NOTE — Progress Notes (Signed)
Pt in as new patient for iron deficiency anemia.

## 2017-09-20 ENCOUNTER — Inpatient Hospital Stay: Payer: Medicare PPO

## 2017-09-20 VITALS — BP 152/70 | HR 67 | Temp 97.9°F | Resp 18

## 2017-09-20 DIAGNOSIS — D500 Iron deficiency anemia secondary to blood loss (chronic): Secondary | ICD-10-CM

## 2017-09-20 MED ORDER — FERUMOXYTOL INJECTION 510 MG/17 ML
510.0000 mg | Freq: Once | INTRAVENOUS | Status: AC
  Administered 2017-09-20: 510 mg via INTRAVENOUS
  Filled 2017-09-20: qty 17

## 2017-09-20 MED ORDER — SODIUM CHLORIDE 0.9 % IV SOLN
Freq: Once | INTRAVENOUS | Status: AC
  Administered 2017-09-20: 14:00:00 via INTRAVENOUS
  Filled 2017-09-20: qty 1000

## 2017-09-20 MED ORDER — DIPHENHYDRAMINE HCL 25 MG PO CAPS
25.0000 mg | ORAL_CAPSULE | Freq: Once | ORAL | Status: AC
  Administered 2017-09-20: 25 mg via ORAL
  Filled 2017-09-20: qty 1

## 2017-09-20 NOTE — Progress Notes (Signed)
Pt tolerated infusion well. Pt and VS stable at discharge.  

## 2017-09-26 ENCOUNTER — Inpatient Hospital Stay: Payer: Medicare PPO

## 2017-09-26 VITALS — BP 123/72 | HR 54 | Temp 96.0°F | Resp 18

## 2017-09-26 DIAGNOSIS — D500 Iron deficiency anemia secondary to blood loss (chronic): Secondary | ICD-10-CM

## 2017-09-26 MED ORDER — SODIUM CHLORIDE 0.9 % IV SOLN
Freq: Once | INTRAVENOUS | Status: AC
  Administered 2017-09-26: 13:00:00 via INTRAVENOUS
  Filled 2017-09-26: qty 1000

## 2017-09-26 MED ORDER — SODIUM CHLORIDE 0.9 % IV SOLN
510.0000 mg | Freq: Once | INTRAVENOUS | Status: AC
  Administered 2017-09-26: 510 mg via INTRAVENOUS
  Filled 2017-09-26: qty 17

## 2017-09-26 MED ORDER — DIPHENHYDRAMINE HCL 25 MG PO CAPS
25.0000 mg | ORAL_CAPSULE | Freq: Once | ORAL | Status: AC
  Administered 2017-09-26: 25 mg via ORAL
  Filled 2017-09-26: qty 1

## 2017-09-26 NOTE — Progress Notes (Signed)
Pt tolerated infusion well. Pt and VS stable at discharge.  

## 2017-10-26 ENCOUNTER — Telehealth: Payer: Self-pay | Admitting: *Deleted

## 2017-10-26 NOTE — Telephone Encounter (Signed)
Labs received from PCP and lab appointment here cancelled and left voice mail for patient that he does not need to come for lab appointment

## 2017-10-26 NOTE — Telephone Encounter (Signed)
-----   Message from Wilburn Cornelia sent at 10/25/2017  4:25 PM EDT ----- Regarding: RE: LABS? Contact: [phone] I just talked to him  And he said Dr Humphrey Rolls office faxed results over-and wondered if we had them so hell know not to come Friday. ----- Message ----- From: Betti Cruz, RN Sent: 10/25/2017   3:06 PM To: Wilburn Cornelia Subject: RE: LABS?                                      He was to call me today to let me know exactly what his PCP drew yesterday. He needs a cbc, IIBC, Ferr. If he had those drawn then he does not need to come in here Friday. I spoke with him about this yesterday.   ----- Message ----- From: Wilburn Cornelia Sent: 10/25/2017   2:52 PM To: Betti Cruz, RN Subject: LABS?                                          Can you help me? This pt wants to know since he just had labs yesterday with his PCP that draws everything-does he need Labs this Friday too

## 2017-10-27 ENCOUNTER — Inpatient Hospital Stay: Payer: Medicare HMO

## 2017-10-30 ENCOUNTER — Encounter (INDEPENDENT_AMBULATORY_CARE_PROVIDER_SITE_OTHER): Payer: Self-pay

## 2017-10-30 ENCOUNTER — Other Ambulatory Visit: Payer: Self-pay

## 2017-10-30 ENCOUNTER — Inpatient Hospital Stay: Payer: Medicare HMO | Attending: Oncology | Admitting: Oncology

## 2017-10-30 ENCOUNTER — Inpatient Hospital Stay: Payer: Medicare HMO

## 2017-10-30 ENCOUNTER — Encounter: Payer: Self-pay | Admitting: Oncology

## 2017-10-30 VITALS — BP 151/69 | HR 56 | Temp 98.1°F | Resp 18 | Wt 245.1 lb

## 2017-10-30 DIAGNOSIS — D500 Iron deficiency anemia secondary to blood loss (chronic): Secondary | ICD-10-CM | POA: Diagnosis present

## 2017-10-30 DIAGNOSIS — M7989 Other specified soft tissue disorders: Secondary | ICD-10-CM | POA: Diagnosis not present

## 2017-10-30 DIAGNOSIS — Z7984 Long term (current) use of oral hypoglycemic drugs: Secondary | ICD-10-CM | POA: Insufficient documentation

## 2017-10-30 DIAGNOSIS — E119 Type 2 diabetes mellitus without complications: Secondary | ICD-10-CM | POA: Diagnosis not present

## 2017-10-30 DIAGNOSIS — Z79899 Other long term (current) drug therapy: Secondary | ICD-10-CM | POA: Insufficient documentation

## 2017-10-30 DIAGNOSIS — Z85828 Personal history of other malignant neoplasm of skin: Secondary | ICD-10-CM | POA: Insufficient documentation

## 2017-10-30 DIAGNOSIS — Z7901 Long term (current) use of anticoagulants: Secondary | ICD-10-CM

## 2017-10-30 DIAGNOSIS — K921 Melena: Secondary | ICD-10-CM | POA: Diagnosis not present

## 2017-10-30 DIAGNOSIS — I482 Chronic atrial fibrillation: Secondary | ICD-10-CM

## 2017-10-30 DIAGNOSIS — Z86718 Personal history of other venous thrombosis and embolism: Secondary | ICD-10-CM

## 2017-10-30 NOTE — Progress Notes (Signed)
Hematology/Oncology follow up note Brandon Gibson [phone] [phone]   Patient Care Team: Brandon Maltese, MD as PCP - General (Internal Medicine)  REFERRING PROVIDER: Perrin Maltese, MD  REASON FOR VISIT Follow up for treatment of iron deficiency.   HISTORY OF PRESENTING ILLNESS:  Brandon Gibson is a  67 y.o.  male with PMH listed below who was referred to me for evaluation of low iron.  He takes Xarelto for chronic atrial fibrillation and history of unprovoked right lower extremity DVT.  He has noticed bright red blood in the stool.  His last colonoscopy was 7 to 8 years ago.  He has already had an appointment with [facility] clinic in June. He recently had lab work done with Dr. Humphrey Rolls which showed mild anemia with hemoglobin around 11, low iron saturation, ferritin is also decreased meeting diagnostic criteria for iron deficiency anemia. Patient reports overall doing well.  No weight loss, epigastric pain, abdominal pain, fever or chills.  INTERVAL HISTORY Brandon Gibson is a 67 y.o. male who has above history reviewed by me today presents for follow up visit for for management of iron deficiency.   Problems and complaints are listed below: s/p IV feraheme 510mg  x 2. Patient has had labs done at [facility] in the results were personally reviewed by me. On 10/24/2017, CBC showed WBC 10.7, hemoglobin 14.2, MCV 81, platelet 1 56,000, neutrophil 9.4, foresight 0.5 chemistry showed creatinine 0.94, calcium 9.4, bilirubin 0.2, serum iron 79, iron saturation 20%, TIBC 393, ferritin 115. Fatigue is better.  Has appointment with GI.   Review of Systems  Constitutional: Negative for chills, fever, malaise/fatigue and weight loss.  HENT: Negative for congestion, ear discharge, ear pain, nosebleeds, sinus pain and sore throat.   Eyes: Negative for double vision, photophobia, pain, discharge and redness.  Respiratory: Negative for cough,  hemoptysis, sputum production, shortness of breath and wheezing.   Cardiovascular: Negative for chest pain, palpitations, orthopnea, claudication and leg swelling.  Gastrointestinal: Positive for blood in stool. Negative for abdominal pain, constipation, diarrhea, heartburn, melena, nausea and vomiting.  Genitourinary: Negative for dysuria, flank pain, frequency and hematuria.  Musculoskeletal: Negative for back pain, myalgias and neck pain.  Skin: Negative for itching and rash.  Neurological: Negative for dizziness, tingling, tremors, focal weakness, weakness and headaches.  Endo/Heme/Allergies: Negative for environmental allergies. Does not bruise/bleed easily.  Psychiatric/Behavioral: Negative for depression and hallucinations. The patient is not nervous/anxious.     MEDICAL HISTORY:  Past Medical History:  Diagnosis Date  . Arthritis   . Atrial fibrillation ([place])   . BPH (benign prostatic hyperplasia)   . Cancer (HCC)    HX SKIN CANCER Basal cell  . Clotting disorder ([place])   . Diabetes mellitus without complication ([place])    type 2  . Dysrhythmia    IRREG HEART BEAT  . GERD (gastroesophageal reflux disease)   . H/O pleurisy   . Hypercholesteremia   . Hypertension   . Iron deficiency anemia due to chronic blood loss 09/18/2017  . Sleep apnea    sleep study Dr. Chancy Milroy, uses CPAP    SURGICAL HISTORY: Past Surgical History:  Procedure Laterality Date  . ANTERIOR CERVICAL DECOMP/DISCECTOMY FUSION  11/25/2011   Procedure: ANTERIOR CERVICAL DECOMPRESSION/DISCECTOMY FUSION 2 LEVELS;  Surgeon: Floyce Stakes, MD;  Location: [facility];  Service: Neurosurgery;  Laterality: N/A;  Cervical four-five,Cervical five-six  Anterior cervical decompression/diskectomy, fusion, plate  . BREAST SURGERY  lumpectomy  . CARDIAC CATHETERIZATION     2011, [facility]  . CARDIOVASCULAR STRESS TEST  2011  . CERVICAL FUSION     C 6/7   . EYE SURGERY     LASIK  . KNEE ARTHROPLASTY Right 10/05/2015    Procedure: COMPUTER ASSISTED TOTAL KNEE ARTHROPLASTY;  Surgeon: Dereck Leep, MD;  Location: [facility] ORS;  Service: Orthopedics;  Laterality: Right;  . KNEE ARTHROSCOPY     Right  . KNEE ARTHROSCOPY Left 08/10/2015   Procedure: LEFT KNEE ARTHROSCOPY, CHONDROPLASTY, MEDIAL MENISECTOMY;  Surgeon: Dereck Leep, MD;  Location: [facility] ORS;  Service: Orthopedics;  Laterality: Left;  . LUMBAR LAMINECTOMY/DECOMPRESSION MICRODISCECTOMY N/A 10/04/2013   Procedure: LUMBAR TWO TO THREE LUMBAR LAMINECTOMY/DECOMPRESSION MICRODISCECTOMY 1 LEVEL;  Surgeon: Floyce Stakes, MD;  Location: [facility];  Service: Neurosurgery;  Laterality: N/A;  L2-3 Laminectomy  . POSTERIOR LAMINECTOMY / DECOMPRESSION LUMBAR SPINE    . TRANSESOPHAGEAL ECHOCARDIOGRAM  2011    SOCIAL HISTORY: Social History   Socioeconomic History  . Marital status: Widowed    Spouse name: Not on file  . Number of children: Not on file  . Years of education: Not on file  . Highest education level: Not on file  Occupational History  . Not on file  Social Needs  . Financial resource strain: Not on file  . Food insecurity:    Worry: Not on file    Inability: Not on file  . Transportation needs:    Medical: Not on file    Non-medical: Not on file  Tobacco Use  . Smoking status: Never Smoker  . Smokeless tobacco: Never Used  Substance and Sexual Activity  . Alcohol use: Yes    Alcohol/week: 8.4 oz    Types: 14 Glasses of wine per week  . Drug use: No  . Sexual activity: Not on file  Lifestyle  . Physical activity:    Days per week: Not on file    Minutes per session: Not on file  . Stress: Not on file  Relationships  . Social connections:    Talks on phone: Not on file    Gets together: Not on file    Attends religious service: Not on file    Active member of club or organization: Not on file    Attends meetings of clubs or organizations: Not on file    Relationship status: Not on file  . Intimate partner violence:    Fear  of current or ex partner: Not on file    Emotionally abused: Not on file    Physically abused: Not on file    Forced sexual activity: Not on file  Other Topics Concern  . Not on file  Social History Narrative  . Not on file    FAMILY HISTORY: Family History  Problem Relation Age of Onset  . Leukemia Father   . Prostate cancer Neg Hx   . Chronic Renal Failure Neg Hx     ALLERGIES:  is allergic to morphine and related and ace inhibitors.  MEDICATIONS:  Current Outpatient Medications  Medication Sig Dispense Refill  . ACCU-CHEK AVIVA PLUS test strip     . amiodarone (PACERONE) 200 MG tablet Take 200 mg by mouth at bedtime.    . hydrALAZINE (APRESOLINE) 50 MG tablet Take 50 mg by mouth 2 (two) times daily.    . hydrochlorothiazide (HYDRODIURIL) 25 MG tablet Take 25 mg by mouth every morning.     . metFORMIN (GLUCOPHAGE) 500 MG  tablet Take 500 mg by mouth 2 (two) times daily with a meal.     . metoprolol succinate (TOPROL-XL) 25 MG 24 hr tablet Take 25 mg by mouth every morning.    . Omega-3 Fatty Acids (FISH OIL) 1000 MG CAPS Take by mouth.    Marland Kitchen omeprazole (PRILOSEC) 40 MG capsule Take 40 mg by mouth every morning.     . potassium chloride (K-DUR) 10 MEQ tablet Take 10 mEq by mouth at bedtime.     . rivaroxaban (XARELTO) 20 MG TABS tablet Take 20 mg by mouth at bedtime.    . rosuvastatin (CRESTOR) 20 MG tablet Take 20 mg by mouth at bedtime. Pt is to finish his bottle of Pravastatin then start the Crestor.    . tamsulosin (FLOMAX) 0.4 MG CAPS capsule Take 1 capsule (0.4 mg total) by mouth daily. 90 capsule 3  . baclofen (LIORESAL) 10 MG tablet   2  . ferrous sulfate 325 (65 FE) MG tablet Take 325 mg by mouth daily with breakfast.    . furosemide (LASIX) 20 MG tablet Take 20 mg by mouth.    . meloxicam (MOBIC) 15 MG tablet Take 15 mg by mouth daily.    . niacin 500 MG tablet Take 500 mg by mouth at bedtime.     No current facility-administered medications for this visit.       PHYSICAL EXAMINATION: ECOG PERFORMANCE STATUS: 0 - Asymptomatic Vitals:   10/30/17 1332  BP: (!) 151/69  Pulse: (!) 56  Resp: 18  Temp: 98.1 F (36.7 C)  SpO2: 95%   Filed Weights   10/30/17 1332  Weight: 245 lb 1.6 oz (111.2 kg)    Physical Exam  Constitutional: He is oriented to person, place, and time. He appears well-developed and well-nourished. No distress.  HENT:  Head: Normocephalic and atraumatic.  Right Ear: External ear normal.  Left Ear: External ear normal.  Mouth/Throat: Oropharynx is clear and moist.  Eyes: Pupils are equal, round, and reactive to light. Conjunctivae and EOM are normal. No scleral icterus.  Neck: Normal range of motion. Neck supple.  Cardiovascular: Normal rate, regular rhythm and normal heart sounds.  Pulmonary/Chest: Effort normal and breath sounds normal. No respiratory distress. He has no wheezes. He has no rales. He exhibits no tenderness.  Abdominal: Soft. Bowel sounds are normal. He exhibits no distension and no mass. There is no tenderness.  Musculoskeletal: Normal range of motion. He exhibits edema. He exhibits no deformity.  Right ankle swelling, with brace.   Lymphadenopathy:    He has no cervical adenopathy.  Neurological: He is alert and oriented to person, place, and time. No cranial nerve deficit. Coordination normal.  Skin: Skin is warm and dry. No rash noted.  Psychiatric: He has a normal mood and affect. His behavior is normal. Thought content normal.     LABORATORY DATA:  I have reviewed the data as listed Lab Results  Component Value Date   WBC 6.0 10/07/2015   HGB 11.9 (L) 10/07/2015   HCT 35.0 (L) 10/07/2015   MCV 87.4 10/07/2015   PLT 141 (L) 10/07/2015   No results for input(s): NA, K, CL, CO2, GLUCOSE, BUN, CREATININE, CALCIUM, GFRNONAA, GFRAA, PROT, ALBUMIN, AST, ALT, ALKPHOS, BILITOT, BILIDIR, IBILI in the last 8760 hours.     ASSESSMENT & PLAN:  1. Iron deficiency anemia due to chronic blood loss    2. Blood in stool   3. Swelling of right lower extremity    #Iron store  reviewed. Good iron store hold additional IV ion.   # right ankle swelling, follow up with Dr.Fowler, using ankle brace.  # blood in stool. Established care with Dr.Elliot will have colonoscopy work up.  All questions were answered. The patient knows to call the clinic with any problems questions or concerns.  Return of visit: 3 months  Earlie Server, MD, PhD Hematology Oncology [facility] at [facility] Pager- [phone] 10/30/2017

## 2017-11-07 ENCOUNTER — Encounter: Payer: Self-pay | Admitting: *Deleted

## 2017-11-08 ENCOUNTER — Encounter
Admission: RE | Disposition: A | Discharge status: Home / Self Care | Payer: Self-pay | Source: Ambulatory Visit | Attending: Internal Medicine

## 2017-11-08 ENCOUNTER — Ambulatory Visit: Payer: Medicare HMO | Admitting: Anesthesiology

## 2017-11-08 ENCOUNTER — Encounter: Payer: Self-pay | Admitting: *Deleted

## 2017-11-08 ENCOUNTER — Ambulatory Visit
Admission: RE | Admit: 2017-11-08 | Discharge: 2017-11-08 | Disposition: A | Discharge status: Home / Self Care | Payer: Medicare HMO | Source: Ambulatory Visit | Attending: Internal Medicine | Admitting: Internal Medicine

## 2017-11-08 DIAGNOSIS — K219 Gastro-esophageal reflux disease without esophagitis: Secondary | ICD-10-CM | POA: Insufficient documentation

## 2017-11-08 DIAGNOSIS — R195 Other fecal abnormalities: Secondary | ICD-10-CM | POA: Insufficient documentation

## 2017-11-08 DIAGNOSIS — N400 Enlarged prostate without lower urinary tract symptoms: Secondary | ICD-10-CM | POA: Insufficient documentation

## 2017-11-08 DIAGNOSIS — K640 First degree hemorrhoids: Secondary | ICD-10-CM | POA: Insufficient documentation

## 2017-11-08 DIAGNOSIS — E785 Hyperlipidemia, unspecified: Secondary | ICD-10-CM | POA: Insufficient documentation

## 2017-11-08 DIAGNOSIS — D649 Anemia, unspecified: Secondary | ICD-10-CM | POA: Diagnosis not present

## 2017-11-08 DIAGNOSIS — I4891 Unspecified atrial fibrillation: Secondary | ICD-10-CM | POA: Diagnosis not present

## 2017-11-08 DIAGNOSIS — Z79899 Other long term (current) drug therapy: Secondary | ICD-10-CM | POA: Diagnosis not present

## 2017-11-08 DIAGNOSIS — K5730 Diverticulosis of large intestine without perforation or abscess without bleeding: Secondary | ICD-10-CM | POA: Insufficient documentation

## 2017-11-08 DIAGNOSIS — Z791 Long term (current) use of non-steroidal anti-inflammatories (NSAID): Secondary | ICD-10-CM | POA: Insufficient documentation

## 2017-11-08 DIAGNOSIS — D689 Coagulation defect, unspecified: Secondary | ICD-10-CM | POA: Insufficient documentation

## 2017-11-08 DIAGNOSIS — Z96651 Presence of right artificial knee joint: Secondary | ICD-10-CM | POA: Diagnosis not present

## 2017-11-08 DIAGNOSIS — E119 Type 2 diabetes mellitus without complications: Secondary | ICD-10-CM | POA: Diagnosis not present

## 2017-11-08 DIAGNOSIS — Z7984 Long term (current) use of oral hypoglycemic drugs: Secondary | ICD-10-CM | POA: Insufficient documentation

## 2017-11-08 DIAGNOSIS — Z85828 Personal history of other malignant neoplasm of skin: Secondary | ICD-10-CM | POA: Diagnosis not present

## 2017-11-08 DIAGNOSIS — K317 Polyp of stomach and duodenum: Secondary | ICD-10-CM | POA: Insufficient documentation

## 2017-11-08 DIAGNOSIS — R1013 Epigastric pain: Secondary | ICD-10-CM | POA: Insufficient documentation

## 2017-11-08 DIAGNOSIS — K635 Polyp of colon: Secondary | ICD-10-CM | POA: Diagnosis not present

## 2017-11-08 DIAGNOSIS — D509 Iron deficiency anemia, unspecified: Secondary | ICD-10-CM | POA: Insufficient documentation

## 2017-11-08 DIAGNOSIS — G4730 Sleep apnea, unspecified: Secondary | ICD-10-CM | POA: Insufficient documentation

## 2017-11-08 DIAGNOSIS — Z7901 Long term (current) use of anticoagulants: Secondary | ICD-10-CM | POA: Diagnosis not present

## 2017-11-08 DIAGNOSIS — K3189 Other diseases of stomach and duodenum: Secondary | ICD-10-CM | POA: Insufficient documentation

## 2017-11-08 DIAGNOSIS — K259 Gastric ulcer, unspecified as acute or chronic, without hemorrhage or perforation: Secondary | ICD-10-CM | POA: Diagnosis not present

## 2017-11-08 DIAGNOSIS — E7800 Pure hypercholesterolemia, unspecified: Secondary | ICD-10-CM | POA: Diagnosis not present

## 2017-11-08 DIAGNOSIS — I10 Essential (primary) hypertension: Secondary | ICD-10-CM | POA: Diagnosis not present

## 2017-11-08 HISTORY — DX: Varicella without complication: B01.9

## 2017-11-08 HISTORY — DX: Measles without complication: B05.9

## 2017-11-08 HISTORY — PX: ESOPHAGOGASTRODUODENOSCOPY (EGD) WITH PROPOFOL: SHX5813

## 2017-11-08 HISTORY — PX: COLONOSCOPY WITH PROPOFOL: SHX5780

## 2017-11-08 HISTORY — DX: Mumps without complication: B26.9

## 2017-11-08 HISTORY — DX: Hyperlipidemia, unspecified: E78.5

## 2017-11-08 LAB — GLUCOSE, CAPILLARY: Glucose-Capillary: 102 mg/dL — ABNORMAL HIGH (ref 70–99)

## 2017-11-08 SURGERY — ESOPHAGOGASTRODUODENOSCOPY (EGD) WITH PROPOFOL
Anesthesia: General

## 2017-11-08 MED ORDER — SODIUM CHLORIDE 0.9 % IV SOLN
INTRAVENOUS | Status: DC
  Administered 2017-11-08: 1000 mL via INTRAVENOUS

## 2017-11-08 MED ORDER — FENTANYL CITRATE (PF) 100 MCG/2ML IJ SOLN
INTRAMUSCULAR | Status: DC | PRN
  Administered 2017-11-08: 50 ug via INTRAVENOUS

## 2017-11-08 MED ORDER — LIDOCAINE HCL (PF) 2 % IJ SOLN
INTRAMUSCULAR | Status: AC
  Filled 2017-11-08: qty 10

## 2017-11-08 MED ORDER — MIDAZOLAM HCL 2 MG/2ML IJ SOLN
INTRAMUSCULAR | Status: DC | PRN
  Administered 2017-11-08: 2 mg via INTRAVENOUS

## 2017-11-08 MED ORDER — MIDAZOLAM HCL 2 MG/2ML IJ SOLN
INTRAMUSCULAR | Status: AC
  Filled 2017-11-08: qty 2

## 2017-11-08 MED ORDER — FENTANYL CITRATE (PF) 100 MCG/2ML IJ SOLN
INTRAMUSCULAR | Status: AC
  Filled 2017-11-08: qty 2

## 2017-11-08 MED ORDER — LIDOCAINE HCL (PF) 1 % IJ SOLN
INTRAMUSCULAR | Status: AC
  Administered 2017-11-08: 0.3 mL
  Filled 2017-11-08: qty 2

## 2017-11-08 MED ORDER — PROPOFOL 500 MG/50ML IV EMUL
INTRAVENOUS | Status: AC
  Filled 2017-11-08: qty 50

## 2017-11-08 MED ORDER — PROPOFOL 500 MG/50ML IV EMUL
INTRAVENOUS | Status: DC | PRN
  Administered 2017-11-08: 120 ug/kg/min via INTRAVENOUS

## 2017-11-08 NOTE — Transfer of Care (Signed)
   Immediate Anesthesia Transfer of Care Note  Patient: Brandon Gibson  Procedure(s) Performed: ESOPHAGOGASTRODUODENOSCOPY (EGD) WITH PROPOFOL (N/A ) COLONOSCOPY WITH PROPOFOL (N/A )  Patient Location: PACU  Anesthesia Type:General  Level of Consciousness: awake and sedated  Airway & Oxygen Therapy: Patient Spontanous Breathing and Patient connected to nasal cannula oxygen  Post-op Assessment: Report given to RN and Post -op Vital signs reviewed and stable  Post vital signs: Reviewed and stable  Last Vitals:  Vitals Value Taken Time  BP    Temp    Pulse    Resp    SpO2      Last Pain:  Vitals:   11/08/17 1028  TempSrc: Tympanic  PainSc: 0-No pain         Complications: No apparent anesthesia complications

## 2017-11-08 NOTE — Anesthesia Preprocedure Evaluation (Signed)
Anesthesia Evaluation  Patient identified by MRN, date of birth, ID band Patient awake    Reviewed: Allergy & Precautions, H&P , NPO status , Patient's Chart, lab work & pertinent test results, reviewed documented beta blocker date and time   History of Anesthesia Complications Negative for: history of anesthetic complications  Airway Mallampati: II  TM Distance: >3 FB Neck ROM: full    Dental no notable dental hx. (+) Dental Advidsory Given   Pulmonary neg pulmonary ROS, sleep apnea and Continuous Positive Airway Pressure Ventilation ,    Pulmonary exam normal        Cardiovascular Exercise Tolerance: Good hypertension, + angina (-) CAD, (-) Past MI, (-) CABG and (-) CHF Normal cardiovascular exam+ dysrhythmias (AFib on Xarelto and metoprolol)      Neuro/Psych negative neurological ROS  negative psych ROS   GI/Hepatic negative GI ROS, Neg liver ROS, GERD  ,  Endo/Other  negative endocrine ROSdiabetes, Well Controlled, Type 2  Renal/GU negative Renal ROS  negative genitourinary   Musculoskeletal   Abdominal   Peds  Hematology negative hematology ROS (+) anemia ,   Anesthesia Other Findings Past Medical History: No date: Arthritis No date: Atrial fibrillation (HCC) No date: BPH (benign prostatic hyperplasia) No date: Cancer (HCC)     Comment:  HX SKIN CANCER Basal cell No date: Chickenpox No date: Clotting disorder (HCC) No date: Diabetes mellitus without complication (HCC)     Comment:  type 2 No date: Dysrhythmia     Comment:  IRREG HEART BEAT No date: GERD (gastroesophageal reflux disease) No date: H/O pleurisy No date: Hypercholesteremia No date: Hyperlipidemia No date: Hypertension 09/18/2017: Iron deficiency anemia due to chronic blood loss No date: Measles No date: Mumps No date: Sleep apnea     Comment:  sleep study Dr. Chancy Milroy, uses CPAP  Past Surgical History: 11/25/2011: ANTERIOR CERVICAL  DECOMP/DISCECTOMY FUSION     Comment:  Procedure: ANTERIOR CERVICAL DECOMPRESSION/DISCECTOMY               FUSION 2 LEVELS;  Surgeon: Floyce Stakes, MD;                Location: [facility];  Service: Neurosurgery;                Laterality: N/A;  Cervical four-five,Cervical five-six                Anterior cervical decompression/diskectomy, fusion, plate No date: BREAST SURGERY     Comment:  lumpectomy No date: CARDIAC CATHETERIZATION     Comment:  2011, [place] 2011: CARDIOVASCULAR STRESS TEST No date: CERVICAL FUSION     Comment:  C 6/7  No date: EYE SURGERY     Comment:  LASIK No date: JOINT REPLACEMENT 10/05/2015: KNEE ARTHROPLASTY; Right     Comment:  Procedure: COMPUTER ASSISTED TOTAL KNEE ARTHROPLASTY;                Surgeon: Dereck Leep, MD;  Location: [facility] ORS;                Service: Orthopedics;  Laterality: Right; No date: KNEE ARTHROSCOPY     Comment:  Right 08/10/2015: KNEE ARTHROSCOPY; Left     Comment:  Procedure: LEFT KNEE ARTHROSCOPY, CHONDROPLASTY, MEDIAL               MENISECTOMY;  Surgeon: Dereck Leep, MD;  Location:               [facility] ORS;  Service: Orthopedics;  Laterality: Left; 10/04/2013: LUMBAR LAMINECTOMY/DECOMPRESSION MICRODISCECTOMY; N/A     Comment:  Procedure: LUMBAR TWO TO THREE LUMBAR               LAMINECTOMY/DECOMPRESSION MICRODISCECTOMY 1 LEVEL;                Surgeon: Floyce Stakes, MD;  Location: [place] [facility];                Service: Neurosurgery;  Laterality: N/A;  L2-3               Laminectomy No date: POSTERIOR LAMINECTOMY / DECOMPRESSION LUMBAR SPINE 2011: TRANSESOPHAGEAL ECHOCARDIOGRAM  BMI    Body Mass Index:  35.15 kg/m     Reproductive/Obstetrics negative OB ROS                             Anesthesia Physical Anesthesia Plan  ASA: III  Anesthesia Plan: General   Post-op Pain Management:    Induction:   PONV Risk Score and Plan:   Airway Management Planned:   Additional Equipment:    Intra-op Plan:   Post-operative Plan:   Informed Consent:   Dental Advisory Given  Plan Discussed with: Anesthesiologist, CRNA and Surgeon  Anesthesia Plan Comments:         Anesthesia Quick Evaluation

## 2017-11-08 NOTE — Interval H&P Note (Signed)
History and Physical Interval Note:  11/08/2017 10:35 AM  Brandon Gibson  has presented today for surgery, with the diagnosis of IDA  The various methods of treatment have been discussed with the patient and family. After consideration of risks, benefits and other options for treatment, the patient has consented to  Procedure(s): ESOPHAGOGASTRODUODENOSCOPY (EGD) WITH PROPOFOL (N/A) COLONOSCOPY WITH PROPOFOL (N/A) as a surgical intervention .  The patient's history has been reviewed, patient examined, no change in status, stable for surgery.  I have reviewed the patient's chart and labs.  Questions were answered to the patient's satisfaction.     [place], [place]

## 2017-11-08 NOTE — Anesthesia Post-op Follow-up Note (Signed)
Anesthesia QCDR form completed.        

## 2017-11-08 NOTE — Anesthesia Procedure Notes (Signed)
Performed by: Cook-Martin, Agostino Gorin Pre-anesthesia Checklist: Patient identified, Emergency Drugs available, Suction available, Patient being monitored and Timeout performed Patient Re-evaluated:Patient Re-evaluated prior to induction Oxygen Delivery Method: Nasal cannula Preoxygenation: Pre-oxygenation with 100% oxygen Induction Type: IV induction Airway Equipment and Method: Bite block Placement Confirmation: positive ETCO2 and CO2 detector       

## 2017-11-08 NOTE — H&P (Signed)
Outpatient short stay form Pre-procedure 11/08/2017 9:30 AM Teodoro K. Alice Reichert, M.D.  Primary Physician: Lamonte Sakai, M.D.  Reason for visit:  Iron deficiency anemia, Heme positive stool, epigastric pain  History of present illness:  Patient is a 67 y/o male with a several month complaint of epigastric pain for which he is taking omeprazole. He has noted a hemoccult positive stool found by DR. Humphrey Rolls, his internist. HE has iron deficiency anemia with low iron and ferritin levels. He has undergone at least 2 iron infusions for iron deficiency anemia.    No current facility-administered medications for this encounter.   Medications Prior to Admission  Medication Sig Dispense Refill Last Dose  . ACCU-CHEK AVIVA PLUS test strip    Taking  . amiodarone (PACERONE) 200 MG tablet Take 200 mg by mouth at bedtime.   Taking  . baclofen (LIORESAL) 10 MG tablet   2 Not Taking  . ferrous sulfate 325 (65 FE) MG tablet Take 325 mg by mouth daily with breakfast.   Not Taking  . furosemide (LASIX) 20 MG tablet Take 20 mg by mouth.   Not Taking  . hydrALAZINE (APRESOLINE) 50 MG tablet Take 50 mg by mouth 2 (two) times daily.   Taking  . hydrochlorothiazide (HYDRODIURIL) 25 MG tablet Take 25 mg by mouth every morning.    Taking  . meloxicam (MOBIC) 15 MG tablet Take 15 mg by mouth daily.   Not Taking  . metFORMIN (GLUCOPHAGE) 500 MG tablet Take 500 mg by mouth 2 (two) times daily with a meal.    Taking  . metoprolol succinate (TOPROL-XL) 25 MG 24 hr tablet Take 25 mg by mouth every morning.   Taking  . niacin 500 MG tablet Take 500 mg by mouth at bedtime.   Not Taking  . Omega-3 Fatty Acids (FISH OIL) 1000 MG CAPS Take by mouth.   Taking  . omeprazole (PRILOSEC) 40 MG capsule Take 40 mg by mouth every morning.    Taking  . potassium chloride (K-DUR) 10 MEQ tablet Take 10 mEq by mouth at bedtime.    Taking  . rivaroxaban (XARELTO) 20 MG TABS tablet Take 20 mg by mouth at bedtime.   Taking  . rosuvastatin  (CRESTOR) 20 MG tablet Take 20 mg by mouth at bedtime. Pt is to finish his bottle of Pravastatin then start the Crestor.   Taking  . tamsulosin (FLOMAX) 0.4 MG CAPS capsule Take 1 capsule (0.4 mg total) by mouth daily. 90 capsule 3 Taking     Allergies  Allergen Reactions  . Morphine And Related Anaphylaxis  . Ace Inhibitors Swelling     Past Medical History:  Diagnosis Date  . Arthritis   . Atrial fibrillation ([place])   . BPH (benign prostatic hyperplasia)   . Cancer (HCC)    HX SKIN CANCER Basal cell  . Chickenpox   . Clotting disorder ([place])   . Diabetes mellitus without complication ([place])    type 2  . Dysrhythmia    IRREG HEART BEAT  . GERD (gastroesophageal reflux disease)   . H/O pleurisy   . Hypercholesteremia   . Hyperlipidemia   . Hypertension   . Iron deficiency anemia due to chronic blood loss 09/18/2017  . Measles   . Mumps   . Sleep apnea    sleep study Dr. Chancy Milroy, uses CPAP    Review of systems:  Otherwise negative.    Physical Exam  Gen: Alert, oriented. Appears stated age.  HEENT: [place]/AT. PERRLA. Lungs:  CTA, no wheezes. CV: RR nl S1, S2. Abd: soft, benign, no masses. BS+ Ext: No edema. Pulses 2+    Planned procedures: Proceed with EGD and colonoscopy. The patient understands the nature of the planned procedure, indications, risks, alternatives and potential complications including but not limited to bleeding, infection, perforation, damage to internal organs and possible oversedation/side effects from anesthesia. The patient agrees and gives consent to proceed.  Please refer to procedure notes for findings, recommendations and patient disposition/instructions.     Teodoro K. Alice Reichert, M.D. Gastroenterology 11/08/2017  9:30 AM

## 2017-11-08 NOTE — Op Note (Signed)
[facility] Gastroenterology Patient Name: Jakeem Grape Biljon Procedure Date: 11/08/2017 10:57 AM MRN: [id number] Account #: [id number] Date of Birth: 1950-12-07 Admit Type: Outpatient Age: 67 Room: [facility] ENDO ROOM 4 Gender: Male Note Status: Finalized Procedure:            Colonoscopy Indications:          Unexplained iron deficiency anemia, Heme positive stool Providers:            Benay Pike. Alice Reichert MD, MD Referring MD:         Perrin Maltese, MD (Referring MD) Complications:        No immediate complications. Procedure:            Pre-Anesthesia Assessment:                       - The risks and benefits of the procedure and the                        sedation options and risks were discussed with the                        patient. All questions were answered and informed                        consent was obtained.                       - Patient identification and proposed procedure were                        verified prior to the procedure by the nurse. The                        procedure was verified in the procedure room.                       - ASA Grade Assessment: III - A patient with severe                        systemic disease.                       - After reviewing the risks and benefits, the patient                        was deemed in satisfactory condition to undergo the                        procedure.                       After obtaining informed consent, the colonoscope was                        passed under direct vision. Throughout the procedure,                        the patient's blood pressure, pulse, and oxygen                        saturations were monitored continuously. The  Colonoscope was introduced through the anus and                        advanced to the the cecum, identified by appendiceal                        orifice and ileocecal valve. The colonoscopy was                        performed  without difficulty. The patient tolerated the                        procedure well. The quality of the bowel preparation                        was adequate. The ileocecal valve, appendiceal orifice,                        and rectum were photographed. Findings:      The perianal and digital rectal examinations were normal. Pertinent       negatives include normal sphincter tone and no palpable rectal lesions.      Many small-mouthed diverticula were found in the ascending colon and       cecum.      A 3 mm polyp was found in the cecum. The polyp was sessile. The polyp       was removed with a jumbo cold forceps. Resection and retrieval were       complete.      A 5 mm polyp was found in the cecum. The polyp was sessile. The polyp       was removed with a cold snare. Resection and retrieval were complete.      Non-bleeding internal hemorrhoids were found during retroflexion. The       hemorrhoids were Grade I (internal hemorrhoids that do not prolapse).      The exam was otherwise without abnormality. Impression:           - Diverticulosis in the ascending colon and in the                        cecum.                       - One 3 mm polyp in the cecum, removed with a jumbo                        cold forceps. Resected and retrieved.                       - One 5 mm polyp in the cecum, removed with a cold                        snare. Resected and retrieved.                       - Non-bleeding internal hemorrhoids.                       - The examination was otherwise normal. Recommendation:       - Patient has a contact number available for  emergencies. The signs and symptoms of potential                        delayed complications were discussed with the patient.                        Return to normal activities tomorrow. Written discharge                        instructions were provided to the patient.                       - Resume previous diet.                        - Continue present medications.                       - Await pathology results.                       - Repeat colonoscopy is recommended for surveillance.                        The colonoscopy date will be determined after pathology                        results from today's exam become available for review.                       - To visualize the small bowel, perform video capsule                        endoscopy in 3 weeks.                       - The findings and recommendations were discussed with                        the patient and their family. Procedure Code(s):    --- Professional ---                       [phone], Colonoscopy, flexible; with removal of tumor(s),                        polyp(s), or other lesion(s) by snare technique                       45380, 48, Colonoscopy, flexible; with biopsy, single                        or multiple Diagnosis Code(s):    --- Professional ---                       K57.30, Diverticulosis of large intestine without                        perforation or abscess without bleeding                       R19.5, Other fecal abnormalities  D50.9, Iron deficiency anemia, unspecified                       D12.0, Benign neoplasm of cecum                       K64.0, First degree hemorrhoids CPT copyright 2017 American Medical Association. All rights reserved. The codes documented in this report are preliminary and upon coder review may  be revised to meet current compliance requirements. Efrain Sella MD, MD 11/08/2017 11:54:24 AM This report has been signed electronically. Number of Addenda: 0 Note Initiated On: 11/08/2017 10:57 AM Scope Withdrawal Time: 0 hours 9 minutes 1 second  Total Procedure Duration: 0 hours 11 minutes 49 seconds       [facility]

## 2017-11-08 NOTE — Op Note (Signed)
[facility] Gastroenterology Patient Name: Brandon Gibson Procedure Date: 11/08/2017 10:57 AM MRN: [id number] Account #: [id number] Date of Birth: 1950/10/27 Admit Type: Outpatient Age: 67 Room: [facility] ENDO ROOM 4 Gender: Male Note Status: Finalized Procedure:            Upper GI endoscopy Indications:          Suspected upper gastrointestinal bleeding in patient                        with unexplained iron deficiency anemia, Epigastric                        abdominal pain Providers:            Benay Pike. Alice Reichert MD, MD Referring MD:         Perrin Maltese, MD (Referring MD) Medicines:            Propofol per Anesthesia Complications:        No immediate complications. Procedure:            Pre-Anesthesia Assessment:                       - ASA Grade Assessment: III - A patient with severe                        systemic disease.                       - After reviewing the risks and benefits, the patient                        was deemed in satisfactory condition to undergo the                        procedure.                       After obtaining informed consent, the endoscope was                        passed under direct vision. Throughout the procedure,                        the patient's blood pressure, pulse, and oxygen                        saturations were monitored continuously. The Endoscope                        was introduced through the mouth, and advanced to the                        third part of duodenum. The upper GI endoscopy was                        accomplished without difficulty. The patient tolerated                        the procedure well. Findings:      The examined esophagus was normal.      A single 8 mm semi-sessile polyp with  no bleeding and no stigmata of       recent bleeding was found in the gastric body. Biopsies were taken with       a cold forceps for histology.      Multiple 2 to 5 mm sessile polyps with no bleeding  and no stigmata of       recent bleeding were found in the gastric fundus. Biopsies were taken       with a cold forceps for histology.      Patchy mildly erythematous mucosa without bleeding was found in the       gastric antrum. Biopsies were taken with a cold forceps for Helicobacter       pylori testing.      The examined duodenum was normal. Biopsies for histology were taken with       a cold forceps for evaluation of celiac disease. Impression:           - Normal esophagus.                       - A single gastric polyp. Biopsied.                       - Multiple gastric polyps. Biopsied.                       - Erythematous mucosa in the antrum. Biopsied.                       - Normal examined duodenum. Biopsied. Recommendation:       - Await pathology results.                       - Proceed with colonoscopy Procedure Code(s):    --- Professional ---                       [phone], Esophagogastroduodenoscopy, flexible, transoral;                        with biopsy, single or multiple Diagnosis Code(s):    --- Professional ---                       R10.13, Epigastric pain                       D50.9, Iron deficiency anemia, unspecified                       K31.89, Other diseases of stomach and duodenum                       K31.7, Polyp of stomach and duodenum CPT copyright 2017 American Medical Association. All rights reserved. The codes documented in this report are preliminary and upon coder review may  be revised to meet current compliance requirements. Efrain Sella MD, MD 11/08/2017 11:36:20 AM This report has been signed electronically. Number of Addenda: 0 Note Initiated On: 11/08/2017 10:57 AM      [facility]

## 2017-11-09 LAB — SURGICAL PATHOLOGY

## 2017-11-12 ENCOUNTER — Encounter: Payer: Self-pay | Admitting: Internal Medicine

## 2017-11-14 NOTE — Anesthesia Postprocedure Evaluation (Signed)
Anesthesia Post Note  Patient: Brandon Gibson  Procedure(s) Performed: ESOPHAGOGASTRODUODENOSCOPY (EGD) WITH PROPOFOL (N/A ) COLONOSCOPY WITH PROPOFOL (N/A )  Patient location during evaluation: PACU Anesthesia Type: General Level of consciousness: awake and alert Pain management: pain level controlled Vital Signs Assessment: post-procedure vital signs reviewed and stable Respiratory status: spontaneous breathing, nonlabored ventilation and respiratory function stable Cardiovascular status: blood pressure returned to baseline and stable Postop Assessment: no apparent nausea or vomiting Anesthetic complications: no     Last Vitals:  Vitals:   11/08/17 1205 11/08/17 1215  BP: (!) 113/101 139/75  Pulse: (!) 55 (!) 51  Resp: 14 13  Temp:    SpO2: 98% 98%    Last Pain:  Vitals:   11/09/17 0757  TempSrc:   PainSc: 0-No pain                 Durenda Hurt

## 2018-02-02 ENCOUNTER — Encounter: Payer: Self-pay | Admitting: Oncology

## 2018-02-02 ENCOUNTER — Inpatient Hospital Stay: Payer: Medicare HMO

## 2018-02-02 ENCOUNTER — Other Ambulatory Visit: Payer: Self-pay

## 2018-02-02 ENCOUNTER — Inpatient Hospital Stay: Payer: Medicare HMO | Attending: Oncology | Admitting: Oncology

## 2018-02-02 VITALS — BP 129/82 | HR 51 | Temp 97.6°F | Resp 18 | Wt 247.8 lb

## 2018-02-02 DIAGNOSIS — I482 Chronic atrial fibrillation, unspecified: Secondary | ICD-10-CM | POA: Diagnosis not present

## 2018-02-02 DIAGNOSIS — D500 Iron deficiency anemia secondary to blood loss (chronic): Secondary | ICD-10-CM | POA: Diagnosis not present

## 2018-02-02 DIAGNOSIS — E119 Type 2 diabetes mellitus without complications: Secondary | ICD-10-CM | POA: Diagnosis not present

## 2018-02-02 DIAGNOSIS — Z7901 Long term (current) use of anticoagulants: Secondary | ICD-10-CM

## 2018-02-02 DIAGNOSIS — I10 Essential (primary) hypertension: Secondary | ICD-10-CM | POA: Diagnosis not present

## 2018-02-02 DIAGNOSIS — R310 Gross hematuria: Secondary | ICD-10-CM | POA: Diagnosis not present

## 2018-02-02 DIAGNOSIS — Z7984 Long term (current) use of oral hypoglycemic drugs: Secondary | ICD-10-CM | POA: Diagnosis not present

## 2018-02-02 DIAGNOSIS — Z86718 Personal history of other venous thrombosis and embolism: Secondary | ICD-10-CM | POA: Diagnosis not present

## 2018-02-02 DIAGNOSIS — N400 Enlarged prostate without lower urinary tract symptoms: Secondary | ICD-10-CM | POA: Diagnosis not present

## 2018-02-02 DIAGNOSIS — Z79899 Other long term (current) drug therapy: Secondary | ICD-10-CM | POA: Diagnosis not present

## 2018-02-02 NOTE — Progress Notes (Signed)
Patient here for follow up

## 2018-02-02 NOTE — Progress Notes (Signed)
Hematology/Oncology follow up note [facility] [phone] [phone]   Patient Care Team: Perrin Maltese, MD as PCP - General (Internal Medicine)  REFERRING PROVIDER: Perrin Maltese, MD  REASON FOR VISIT Follow up for treatment of iron deficiency.   HISTORY OF PRESENTING ILLNESS:  Brandon Gibson is a  67 y.o.  male with PMH listed below who was referred to me for evaluation of low iron.  He takes Xarelto for chronic atrial fibrillation and history of unprovoked right lower extremity DVT.  He has noticed bright red blood in the stool.  His last colonoscopy was 7 to 8 years ago.  He has already had an appointment with [facility] clinic in June. He recently had lab work done with Dr. Humphrey Rolls which showed mild anemia with hemoglobin around 11, low iron saturation, ferritin is also decreased meeting diagnostic criteria for iron deficiency anemia. Patient reports overall doing well.  No weight loss, epigastric pain, abdominal pain, fever or chills.  s/p IV feraheme 510mg  x 2.  On 10/24/2017, CBC showed WBC 10.7, hemoglobin 14.2, MCV 81, platelet 1 56,000, neutrophil 9.4, foresight 0.5 chemistry showed creatinine 0.94, calcium 9.4, bilirubin 0.2, serum iron 79, iron saturation 20%, TIBC 393, ferritin 115.  INTERVAL HISTORY Brandon Gibson is a 67 y.o. male who has above history reviewed by me today presents for follow up visit for for management of iron deficiency.  Problems and complaints are listed below:  Patient has had labs done at [facility] in the results were personally reviewed by me. 01/30/2018 WBC 4.7, hemoglobin 14.9, MCV 90, platelet count 157, normal differential. Fatigue is better.  During the interval patient has had extensive gastroenterology work-up. 11/08/2017 colonoscopy showed diverticulosis in the ascending colon and in the cecum. - One 3 mm polyp in the cecum, removed with a jumbo cold forceps. Resected and retrieved. - One 5  mm polyp in the cecum, removed with a cold snare. Resected and retrieved. - Non-bleeding internal hemorrhoids. - The examination was otherwise normal. Same-day endoscopy showed gastric polyps and Erythematous mucosa in the antrum Biopsy negative for malignancy.  Patient mentioned that about 1 month ago, he woke up and found blood in the urine.  Spontaneously resolved.  No additional symptoms.  Denies any dysuria, urgency.  He has BPH has been having frequency of urination.  Used to follow-up with [facility] urology.  He believes that he is urology has left the group. Denies any abdominal pain, weight loss.    Review of Systems  Constitutional: Negative for chills, fever, malaise/fatigue and weight loss.  HENT: Negative for congestion, ear discharge, ear pain, nosebleeds, sinus pain and sore throat.   Eyes: Negative for double vision, photophobia, pain, discharge and redness.  Respiratory: Negative for cough, hemoptysis, sputum production, shortness of breath and wheezing.   Cardiovascular: Negative for chest pain, palpitations, orthopnea, claudication and leg swelling.  Gastrointestinal: Negative for abdominal pain, blood in stool, constipation, diarrhea, heartburn, melena, nausea and vomiting.  Genitourinary: Positive for hematuria. Negative for dysuria, flank pain and frequency.  Musculoskeletal: Negative for back pain, myalgias and neck pain.  Skin: Negative for itching and rash.  Neurological: Negative for dizziness, tingling, tremors, focal weakness, weakness and headaches.  Endo/Heme/Allergies: Negative for environmental allergies. Does not bruise/bleed easily.  Psychiatric/Behavioral: Negative for depression and hallucinations. The patient is not nervous/anxious.     MEDICAL HISTORY:  Past Medical History:  Diagnosis Date  . Arthritis   . Atrial fibrillation ([place])   .  BPH (benign prostatic hyperplasia)   . Cancer (HCC)    HX SKIN CANCER Basal cell  . Chickenpox   . Clotting  disorder ([place])   . Diabetes mellitus without complication ([place])    type 2  . Dysrhythmia    IRREG HEART BEAT  . GERD (gastroesophageal reflux disease)   . H/O pleurisy   . Hypercholesteremia   . Hyperlipidemia   . Hypertension   . Iron deficiency anemia due to chronic blood loss 09/18/2017  . Measles   . Mumps   . Sleep apnea    sleep study Dr. Chancy Milroy, uses CPAP    SURGICAL HISTORY: Past Surgical History:  Procedure Laterality Date  . ANTERIOR CERVICAL DECOMP/DISCECTOMY FUSION  11/25/2011   Procedure: ANTERIOR CERVICAL DECOMPRESSION/DISCECTOMY FUSION 2 LEVELS;  Surgeon: Floyce Stakes, MD;  Location: [facility] NEURO ORS;  Service: Neurosurgery;  Laterality: N/A;  Cervical four-five,Cervical five-six  Anterior cervical decompression/diskectomy, fusion, plate  . BREAST SURGERY     lumpectomy  . CARDIAC CATHETERIZATION     2011, [facility]  . CARDIOVASCULAR STRESS TEST  2011  . CERVICAL FUSION     C 6/7   . COLONOSCOPY WITH PROPOFOL N/A 11/08/2017   Procedure: COLONOSCOPY WITH PROPOFOL;  Surgeon: Toledo, Benay Pike, MD;  Location: [facility] ENDOSCOPY;  Service: Gastroenterology;  Laterality: N/A;  . ESOPHAGOGASTRODUODENOSCOPY (EGD) WITH PROPOFOL N/A 11/08/2017   Procedure: ESOPHAGOGASTRODUODENOSCOPY (EGD) WITH PROPOFOL;  Surgeon: Toledo, Benay Pike, MD;  Location: [facility] ENDOSCOPY;  Service: Gastroenterology;  Laterality: N/A;  . EYE SURGERY     LASIK  . JOINT REPLACEMENT    . KNEE ARTHROPLASTY Right 10/05/2015   Procedure: COMPUTER ASSISTED TOTAL KNEE ARTHROPLASTY;  Surgeon: Dereck Leep, MD;  Location: [facility] ORS;  Service: Orthopedics;  Laterality: Right;  . KNEE ARTHROSCOPY     Right  . KNEE ARTHROSCOPY Left 08/10/2015   Procedure: LEFT KNEE ARTHROSCOPY, CHONDROPLASTY, MEDIAL MENISECTOMY;  Surgeon: Dereck Leep, MD;  Location: [facility] ORS;  Service: Orthopedics;  Laterality: Left;  . LUMBAR LAMINECTOMY/DECOMPRESSION MICRODISCECTOMY N/A 10/04/2013   Procedure: LUMBAR TWO TO THREE LUMBAR  LAMINECTOMY/DECOMPRESSION MICRODISCECTOMY 1 LEVEL;  Surgeon: Floyce Stakes, MD;  Location: [facility] NEURO ORS;  Service: Neurosurgery;  Laterality: N/A;  L2-3 Laminectomy  . POSTERIOR LAMINECTOMY / DECOMPRESSION LUMBAR SPINE    . TRANSESOPHAGEAL ECHOCARDIOGRAM  2011    SOCIAL HISTORY: Social History   Socioeconomic History  . Marital status: Widowed    Spouse name: Not on file  . Number of children: Not on file  . Years of education: Not on file  . Highest education level: Not on file  Occupational History  . Not on file  Social Needs  . Financial resource strain: Not on file  . Food insecurity:    Worry: Not on file    Inability: Not on file  . Transportation needs:    Medical: Not on file    Non-medical: Not on file  Tobacco Use  . Smoking status: Never Smoker  . Smokeless tobacco: Never Used  Substance and Sexual Activity  . Alcohol use: Yes    Alcohol/week: 14.0 standard drinks    Types: 14 Glasses of wine per week  . Drug use: No  . Sexual activity: Not on file  Lifestyle  . Physical activity:    Days per week: Not on file    Minutes per session: Not on file  . Stress: Not on file  Relationships  . Social connections:    Talks on phone: Not on  file    Gets together: Not on file    Attends religious service: Not on file    Active member of club or organization: Not on file    Attends meetings of clubs or organizations: Not on file    Relationship status: Not on file  . Intimate partner violence:    Fear of current or ex partner: Not on file    Emotionally abused: Not on file    Physically abused: Not on file    Forced sexual activity: Not on file  Other Topics Concern  . Not on file  Social History Narrative  . Not on file    FAMILY HISTORY: Family History  Problem Relation Age of Onset  . Leukemia Father   . Diabetes Father   . Heart disease Mother   . Prostate cancer Neg Hx   . Chronic Renal Failure Neg Hx     ALLERGIES:  is allergic to morphine  and related and ace inhibitors.  MEDICATIONS:  Current Outpatient Medications  Medication Sig Dispense Refill  . ACCU-CHEK AVIVA PLUS test strip     . amiodarone (PACERONE) 200 MG tablet Take 200 mg by mouth at bedtime.    Marland Kitchen azithromycin (ZITHROMAX) 250 MG tablet Take 250 mg by mouth daily.     . furosemide (LASIX) 20 MG tablet Take 20 mg by mouth.    . hydrALAZINE (APRESOLINE) 50 MG tablet Take 50 mg by mouth 2 (two) times daily.    . hydrochlorothiazide (HYDRODIURIL) 25 MG tablet Take 25 mg by mouth every morning.     . metFORMIN (GLUCOPHAGE) 500 MG tablet Take 500 mg by mouth 2 (two) times daily with a meal.     . metoprolol succinate (TOPROL-XL) 25 MG 24 hr tablet Take 25 mg by mouth every morning.    . Omega-3 Fatty Acids (FISH OIL) 1000 MG CAPS Take by mouth.    Marland Kitchen omeprazole (PRILOSEC) 40 MG capsule Take 40 mg by mouth every morning.     . potassium chloride (K-DUR) 10 MEQ tablet Take 10 mEq by mouth at bedtime.     . rivaroxaban (XARELTO) 20 MG TABS tablet Take 20 mg by mouth at bedtime.    . rosuvastatin (CRESTOR) 20 MG tablet Take 20 mg by mouth at bedtime. Pt is to finish his bottle of Pravastatin then start the Crestor.    . tamsulosin (FLOMAX) 0.4 MG CAPS capsule Take 1 capsule (0.4 mg total) by mouth daily. 90 capsule 3  . baclofen (LIORESAL) 10 MG tablet   2  . ferrous sulfate 325 (65 FE) MG tablet Take 325 mg by mouth daily with breakfast.    . meloxicam (MOBIC) 15 MG tablet Take 15 mg by mouth daily.    . niacin 500 MG tablet Take 500 mg by mouth at bedtime.     No current facility-administered medications for this visit.      PHYSICAL EXAMINATION: ECOG PERFORMANCE STATUS: 0 - Asymptomatic Vitals:   02/02/18 1328  BP: 129/82  Pulse: (!) 51  Resp: 18  Temp: 97.6 F (36.4 C)   Filed Weights   02/02/18 1328  Weight: 247 lb 12.8 oz (112.4 kg)    Physical Exam  Constitutional: He is oriented to person, place, and time. He appears well-developed and  well-nourished. No distress.  HENT:  Head: Normocephalic and atraumatic.  Right Ear: External ear normal.  Left Ear: External ear normal.  Mouth/Throat: Oropharynx is clear and moist.  Eyes: Pupils are equal, round,  and reactive to light. Conjunctivae and EOM are normal. No scleral icterus.  Neck: Normal range of motion. Neck supple.  Cardiovascular: Normal rate, regular rhythm and normal heart sounds.  Pulmonary/Chest: Effort normal and breath sounds normal. No respiratory distress. He has no wheezes. He has no rales. He exhibits no tenderness.  Abdominal: Soft. Bowel sounds are normal. He exhibits no distension and no mass. There is no tenderness.  Musculoskeletal: Normal range of motion. He exhibits no edema or deformity.  Lymphadenopathy:    He has no cervical adenopathy.  Neurological: He is alert and oriented to person, place, and time. No cranial nerve deficit. Coordination normal.  Skin: Skin is warm and dry. No rash noted.  Psychiatric: He has a normal mood and affect. His behavior is normal. Thought content normal.     LABORATORY DATA:  I have reviewed the data as listed Lab Results  Component Value Date   WBC 6.0 10/07/2015   HGB 11.9 (L) 10/07/2015   HCT 35.0 (L) 10/07/2015   MCV 87.4 10/07/2015   PLT 141 (L) 10/07/2015   No results for input(s): NA, K, CL, CO2, GLUCOSE, BUN, CREATININE, CALCIUM, GFRNONAA, GFRAA, PROT, ALBUMIN, AST, ALT, ALKPHOS, BILITOT, BILIDIR, IBILI in the last 8760 hours.     ASSESSMENT & PLAN:  1. Iron deficiency anemia due to chronic blood loss   2. Gross hematuria    #lab reviewed and discussed with patient. CBC showed stable normal hemoglobin.  No iron panel was done. Hold additional IV iron as hemoglobin is stable.  Repeat CBC, iron, TIBC, ferritin in 6 months.   # Hematuria, advise patient to re-establish care with urology. I offer patient to send a new referral and patient says he knows how to get in touch with [facility] urology and  does not need referral.   All questions were answered. The patient knows to call the clinic with any problems questions or concerns.  Return of visit: 6 months  Earlie Server, MD, PhD Hematology Oncology [facility] Pager- [phone] 02/02/2018

## 2018-03-08 ENCOUNTER — Encounter: Payer: Self-pay | Admitting: Urology

## 2018-03-08 ENCOUNTER — Ambulatory Visit: Payer: Medicare HMO | Admitting: Urology

## 2018-03-08 VITALS — BP 160/83 | HR 69 | Ht 70.5 in | Wt 248.1 lb

## 2018-03-08 DIAGNOSIS — E119 Type 2 diabetes mellitus without complications: Secondary | ICD-10-CM | POA: Insufficient documentation

## 2018-03-08 DIAGNOSIS — K219 Gastro-esophageal reflux disease without esophagitis: Secondary | ICD-10-CM | POA: Insufficient documentation

## 2018-03-08 DIAGNOSIS — I10 Essential (primary) hypertension: Secondary | ICD-10-CM | POA: Insufficient documentation

## 2018-03-08 DIAGNOSIS — N368 Other specified disorders of urethra: Secondary | ICD-10-CM

## 2018-03-08 DIAGNOSIS — E785 Hyperlipidemia, unspecified: Secondary | ICD-10-CM | POA: Insufficient documentation

## 2018-03-08 DIAGNOSIS — R319 Hematuria, unspecified: Secondary | ICD-10-CM

## 2018-03-08 LAB — MICROSCOPIC EXAMINATION
Epithelial Cells (non renal): NONE SEEN /hpf (ref 0–10)
WBC, UA: NONE SEEN /hpf (ref 0–5)

## 2018-03-08 LAB — URINALYSIS, COMPLETE
BILIRUBIN UA: NEGATIVE
Glucose, UA: NEGATIVE
Ketones, UA: NEGATIVE
Leukocytes, UA: NEGATIVE
Nitrite, UA: NEGATIVE
PH UA: 6.5 (ref 5.0–7.5)
PROTEIN UA: NEGATIVE
RBC UA: NEGATIVE
Specific Gravity, UA: 1.02 (ref 1.005–1.030)
Urobilinogen, Ur: 1 mg/dL (ref 0.2–1.0)

## 2018-03-08 NOTE — Progress Notes (Signed)
03/08/2018 10:12 AM   [place] 03/17/1951 [id number]  Referring provider: Perrin Maltese, MD [place],  [id number]  Chief Complaint  Patient presents with  . Follow-up  . Hematuria    HPI: 66 year old male previously seen by Dr. Matilde Sprang for erectile dysfunction and lower urinary tract symptoms.  He was due for annual follow-up in January however approximately 2 weeks ago states there was a significant amount of blood on his bed close and sheets.  When he got up to void the initial part of his urinary stream was bloody then clear.  His urine remained clear and he denies recurrent bleeding.   PMH: Past Medical History:  Diagnosis Date  . Arthritis   . Atrial fibrillation ([place])   . BPH (benign prostatic hyperplasia)   . Cancer (HCC)    HX SKIN CANCER Basal cell  . Chickenpox   . Clotting disorder ([place])   . Diabetes mellitus without complication ([place])    type 2  . Dysrhythmia    IRREG HEART BEAT  . GERD (gastroesophageal reflux disease)   . H/O pleurisy   . Hypercholesteremia   . Hyperlipidemia   . Hypertension   . Iron deficiency anemia due to chronic blood loss 09/18/2017  . Measles   . Mumps   . Sleep apnea    sleep study Dr. Chancy Milroy, uses CPAP    Surgical History: Past Surgical History:  Procedure Laterality Date  . ANTERIOR CERVICAL DECOMP/DISCECTOMY FUSION  11/25/2011   Procedure: ANTERIOR CERVICAL DECOMPRESSION/DISCECTOMY FUSION 2 LEVELS;  Surgeon: Floyce Stakes, MD;  [facility]: [facility];  Service: Neurosurgery;  Laterality: N/A;  Cervical four-five,Cervical five-six  Anterior cervical decompression/diskectomy, fusion, plate  . BREAST SURGERY     lumpectomy  . CARDIAC CATHETERIZATION     2011, [facility]  . CARDIOVASCULAR STRESS TEST  2011  . CERVICAL FUSION     C 6/7   . COLONOSCOPY WITH PROPOFOL N/A 11/08/2017   Procedure: COLONOSCOPY WITH PROPOFOL;  Surgeon: Toledo, Benay Pike, MD;  Location: [facility] END[facility];  Service:  Gastroenterology;  Laterality: N/A;  . ESOPHAGOGASTRODUODENOSCOPY (EGD) WITH PROPOFOL N/A 11/08/2017   Procedure: ESOPHAGOGASTRODUODENOSCOPY (EGD) WITH PROPOFOL;  Surgeon: Toledo, Benay Pike, MD;  Location: [facility] ENDOSCOPY;  Service: Gastroenterology;  Laterality: N/A;  . EYE SURGERY     LASIK  . JOINT REPLACEMENT    . KNEE ARTHROPLASTY Right 10/05/2015   Procedure: COMPUTER ASSISTED TOTAL KNEE ARTHROPLASTY;  Surgeon: Dereck Leep, MD;  Location: [facility] ORS;  Service: Orthopedics;  Laterality: Right;  . KNEE ARTHROSCOPY     Right  . KNEE ARTHROSCOPY Left 08/10/2015   Procedure: LEFT KNEE ARTHROSCOPY, CHONDROPLASTY, MEDIAL MENISECTOMY;  Surgeon: Dereck Leep, MD;  Location: [facility] ORS;  Service: Orthopedics;  Laterality: Left;  . LUMBAR LAMINECTOMY/DECOMPRESSION MICRODISCECTOMY N/A 10/04/2013   Procedure: LUMBAR TWO TO THREE LUMBAR LAMINECTOMY/DECOMPRESSION MICRODISCECTOMY 1 LEVEL;  Surgeon: Floyce Stakes, MD;  Location: [facility];  Service: Neurosurgery;  Laterality: N/A;  L2-3 Laminectomy  . POSTERIOR LAMINECTOMY / DECOMPRESSION LUMBAR SPINE    . TRANSESOPHAGEAL ECHOCARDIOGRAM  2011    Home Medications:  Allergies as of 03/08/2018      Reactions   Morphine And Related Anaphylaxis   Ace Inhibitors Swelling      Medication List        Accurate as of 03/08/18 10:12 AM. Always use your most recent med list.          ACCU-CHEK AVIVA PLUS test strip  Generic drug:  glucose blood   amiodarone 200 MG tablet Commonly known as:  PACERONE Take 200 mg by mouth at bedtime.   Fish Oil 1000 MG Caps Take by mouth.   furosemide 20 MG tablet Commonly known as:  LASIX Take 20 mg by mouth.   hydrALAZINE 50 MG tablet Commonly known as:  APRESOLINE Take 50 mg by mouth 2 (two) times daily.   hydrochlorothiazide 25 MG tablet Commonly known as:  HYDRODIURIL Take 25 mg by mouth every morning.   metFORMIN 500 MG tablet Commonly known as:  GLUCOPHAGE Take 500 mg by mouth 2 (two) times daily  with a meal.   metoprolol succinate 25 MG 24 hr tablet Commonly known as:  TOPROL-XL Take 25 mg by mouth every morning.   niacin 500 MG tablet Take 500 mg by mouth at bedtime.   omeprazole 40 MG capsule Commonly known as:  PRILOSEC Take 40 mg by mouth every morning.   potassium chloride 10 MEQ tablet Commonly known as:  K-DUR Take 10 mEq by mouth at bedtime.   rivaroxaban 20 MG Tabs tablet Commonly known as:  XARELTO Take 20 mg by mouth at bedtime.   rosuvastatin 20 MG tablet Commonly known as:  CRESTOR Take 20 mg by mouth at bedtime. Pt is to finish his bottle of Pravastatin then start the Crestor.   tamsulosin 0.4 MG Caps capsule Commonly known as:  FLOMAX Take 1 capsule (0.4 mg total) by mouth daily.   Vitamin D3 1.25 MG (50000 UT) Caps Take 1 capsule by mouth once a week.       Allergies:  Allergies  Allergen Reactions  . Morphine And Related Anaphylaxis  . Ace Inhibitors Swelling    Family History: Family History  Problem Relation Age of Onset  . Leukemia Father   . Diabetes Father   . Heart disease Mother   . Prostate cancer Neg Hx   . Chronic Renal Failure Neg Hx     Social History:  reports that he has never smoked. He has never used smokeless tobacco. He reports that he drinks about 14.0 standard drinks of alcohol per week. He reports that he does not use drugs.  ROS: UROLOGY Frequent Urination?: Yes Hard to postpone urination?: No Burning/pain with urination?: No Get up at night to urinate?: Yes Leakage of urine?: No Urine stream starts and stops?: No Trouble starting stream?: No Do you have to strain to urinate?: No Blood in urine?: Yes Urinary tract infection?: No Sexually transmitted disease?: No Injury to kidneys or bladder?: No Painful intercourse?: No Weak stream?: Yes Erection problems?: No Penile pain?: No  Gastrointestinal Nausea?: No Vomiting?: No Indigestion/heartburn?: No Diarrhea?: No Constipation?:  No  Constitutional Fever: No Night sweats?: No Weight loss?: No Fatigue?: No  Skin Skin rash/lesions?: No Itching?: No  Eyes Blurred vision?: No Double vision?: No  Ears/Nose/Throat Sore throat?: No Sinus problems?: No  Hematologic/Lymphatic Swollen glands?: No Easy bruising?: Yes  Cardiovascular Leg swelling?: No Chest pain?: No  Respiratory Cough?: No Shortness of breath?: No  Endocrine Excessive thirst?: No  Musculoskeletal Back pain?: Yes Joint pain?: Yes  Neurological Headaches?: No Dizziness?: No  Psychologic Depression?: No Anxiety?: No  Physical Exam: BP (!) 160/83 (BP Location: Left Arm, Patient Position: Sitting, Cuff Size: Large)   Pulse 69   Ht 5' 10.5" (1.791 m)   Wt 248 lb 1.6 oz (112.5 kg)   BMI 35.10 kg/m   Constitutional:  Alert and oriented, No acute distress. HEENT: [place] AT,  moist mucus membranes.  Trachea midline, no masses. Cardiovascular: No clubbing, cyanosis, or edema. Respiratory: Normal respiratory effort, no increased work of breathing. GI: Abdomen is soft, nontender, nondistended, no abdominal masses GU: No CVA tenderness.  Prostate 50 g, smooth without nodules Lymph: No cervical or inguinal lymphadenopathy. Skin: No rashes, bruises or suspicious lesions. Neurologic: Grossly intact, no focal deficits, moving all 4 extremities. Psychiatric: Normal mood and affect.  Laboratory Data:  Urinalysis Dipstick/microscopy negative  Assessment & Plan:   67 year old male with an episode of urethral bleeding and initial stream hematuria consistent with a lower tract source most likely prostatic in origin.  Will schedule a screening renal ultrasound and cystoscopy for further evaluation.  He is in agreement and desires to proceed.   Abbie Sons, [place] [address], [place], [place] [address] [phone]

## 2018-03-09 ENCOUNTER — Ambulatory Visit: Payer: Medicare HMO | Admitting: Urology

## 2018-03-13 ENCOUNTER — Encounter: Payer: Self-pay | Admitting: Urology

## 2018-03-13 DIAGNOSIS — N368 Other specified disorders of urethra: Secondary | ICD-10-CM | POA: Insufficient documentation

## 2018-03-15 ENCOUNTER — Other Ambulatory Visit: Payer: Self-pay | Admitting: Family Medicine

## 2018-03-15 ENCOUNTER — Other Ambulatory Visit: Payer: Self-pay | Admitting: Urology

## 2018-03-15 ENCOUNTER — Telehealth: Payer: Self-pay | Admitting: Urology

## 2018-03-15 DIAGNOSIS — R319 Hematuria, unspecified: Secondary | ICD-10-CM

## 2018-03-15 DIAGNOSIS — N368 Other specified disorders of urethra: Secondary | ICD-10-CM

## 2018-03-15 MED ORDER — DIAZEPAM 10 MG PO TABS: ORAL_TABLET | ORAL | 0 refills | Status: DC

## 2018-03-15 MED ORDER — DIAZEPAM 10 MG PO TABS: 10.0000 mg | ORAL_TABLET | Freq: Four times a day (QID) | ORAL | 0 refills | Status: DC | PRN

## 2018-03-15 NOTE — Telephone Encounter (Signed)
U/S appears to be scheduled and ordered. Please advise on, meds prior to cysto

## 2018-03-15 NOTE — Telephone Encounter (Signed)
Patient is scheduled for RUS and valium is up front for pick up. Patient notified and voiced understanding.

## 2018-03-15 NOTE — Telephone Encounter (Signed)
Also, pt was supposed to get RUS prior to cysto and I didn't see order.  His cysto appt is Monday 11/18.

## 2018-03-15 NOTE — Telephone Encounter (Signed)
Pt was told her could get RX for valium before cysto sent in to pharmacy and it's not there.  [phone]

## 2018-03-16 ENCOUNTER — Ambulatory Visit
Admission: RE | Admit: 2018-03-16 | Discharge: 2018-03-16 | Disposition: A | Discharge status: Home / Self Care | Payer: Medicare HMO | Source: Ambulatory Visit | Attending: Urology | Admitting: Urology

## 2018-03-16 DIAGNOSIS — N368 Other specified disorders of urethra: Secondary | ICD-10-CM | POA: Diagnosis not present

## 2018-03-16 DIAGNOSIS — N281 Cyst of kidney, acquired: Secondary | ICD-10-CM | POA: Insufficient documentation

## 2018-03-16 DIAGNOSIS — R319 Hematuria, unspecified: Secondary | ICD-10-CM | POA: Diagnosis not present

## 2018-03-19 ENCOUNTER — Ambulatory Visit: Payer: Medicare HMO | Admitting: Urology

## 2018-03-19 ENCOUNTER — Encounter: Payer: Self-pay | Admitting: Urology

## 2018-03-19 VITALS — BP 145/82 | HR 67 | Ht 70.5 in | Wt 248.0 lb

## 2018-03-19 DIAGNOSIS — N368 Other specified disorders of urethra: Secondary | ICD-10-CM | POA: Diagnosis not present

## 2018-03-19 DIAGNOSIS — R350 Frequency of micturition: Secondary | ICD-10-CM

## 2018-03-19 DIAGNOSIS — N401 Enlarged prostate with lower urinary tract symptoms: Secondary | ICD-10-CM

## 2018-03-19 LAB — URINALYSIS, COMPLETE
Bilirubin, UA: NEGATIVE
Glucose, UA: NEGATIVE
Ketones, UA: NEGATIVE
Leukocytes, UA: NEGATIVE
NITRITE UA: NEGATIVE
PH UA: 6.5 (ref 5.0–7.5)
Protein, UA: NEGATIVE
RBC UA: NEGATIVE
Specific Gravity, UA: 1.02 (ref 1.005–1.030)
UUROB: 0.2 mg/dL (ref 0.2–1.0)

## 2018-03-19 MED ORDER — LIDOCAINE HCL URETHRAL/MUCOSAL 2 % EX GEL
1.0000 "application " | Freq: Once | CUTANEOUS | Status: AC
  Administered 2018-03-19: 1 via URETHRAL

## 2018-03-19 NOTE — Progress Notes (Signed)
   03/19/18  CC:  Chief Complaint  Patient presents with  . Cysto    HPI: 67 year old male with an episode of urethral bleeding on Eliquis.  Renal ultrasound shows no significant upper tract abnormalities.  Blood pressure (!) 145/82, pulse 67, height 5' 10.5" (1.791 m), weight 248 lb (112.5 kg). NED. A&Ox3.    Cystoscopy Procedure Note  Patient identification was confirmed, informed consent was obtained, and patient was prepped using Betadine solution.  Lidocaine jelly was administered per urethral meatus.     Pre-Procedure: - Inspection reveals a normal caliber urethral meatus.  Procedure: The flexible cystoscope was introduced without difficulty - No urethral strictures/lesions are present. - Lateral lobe enlargement prostate with prominent hypervascularity - Mild bladder neck elevation without median lobe  - Bilateral ureteral orifices identified - Bladder mucosa  reveals no ulcers, tumors, or lesions - No bladder stones -Mild trabeculation  Retroflexion shows no intravesical median lobe or other abnormalities   Post-Procedure: - Patient tolerated the procedure well  Assessment/ Plan: Urethral bleeding most likely prostatic in etiology.  He will follow-up annually and was instructed to call for recurrent symptoms.  Return in about 1 year (around 03/20/2019) for Recheck.   Abbie Sons, MD

## 2018-05-23 ENCOUNTER — Ambulatory Visit: Payer: Medicare HMO | Admitting: Urology

## 2018-06-15 ENCOUNTER — Other Ambulatory Visit: Payer: Self-pay | Admitting: Urology

## 2018-06-15 DIAGNOSIS — R350 Frequency of micturition: Principal | ICD-10-CM

## 2018-06-15 DIAGNOSIS — N401 Enlarged prostate with lower urinary tract symptoms: Secondary | ICD-10-CM

## 2018-06-15 MED ORDER — TAMSULOSIN HCL 0.4 MG PO CAPS: 0.4000 mg | ORAL_CAPSULE | Freq: Every day | ORAL | 3 refills | Status: DC

## 2018-06-15 NOTE — Telephone Encounter (Signed)
Tamsulosin refill sent to [facility]

## 2018-06-15 NOTE — Telephone Encounter (Signed)
Pt called and needs new prescription for Tamsulosin sent to [place] [facility].  Please call pt.

## 2018-07-09 ENCOUNTER — Other Ambulatory Visit: Payer: Self-pay | Admitting: Neurological Surgery

## 2018-07-10 NOTE — Pre-Procedure Instructions (Signed)
[place]  07/10/2018      [facility] [address] [place], [place] - [place] [address] [place] [address] [place] [address] Phone: [phone] Fax: [facility] - [place], [place] [address] Phone: [phone] Fax: [phone]    Your procedure is scheduled on July 16, 2018.  Report to [facility] at 940 AM.  Call this number if you have problems the morning of surgery:  [phone]   Remember:  Do not eat or drink after midnight.    Take these medicines the morning of surgery with A SIP OF WATER  Amiodarone (pacerone) Metoprolol Succinate (Toprol-XL) tamsulosin (flomax) Omeprazole (prilosec)  Follow your surgeon's instructions on when to hold/resume Xarelto.  If no instructions were given call the office to determine how they would like to you take Xarelto   7 days prior to surgery STOP taking any Aspirin (unless otherwise instructed by your surgeon), Aleve, Naproxen, Ibuprofen, Motrin, Advil, Goody's, BC's, all herbal medications, fish oil, and all vitamins.     WHAT DO I DO ABOUT MY DIABETES MEDICATION?  Marland Kitchen Do not take oral diabetes medicines (pills) the morning of surgery-metformin (glucophage).   Reviewed and Endorsed by [facility] Patient Education Committee, August 2015  How to Manage Your Diabetes Before and After Surgery  Why is it important to control my blood sugar before and after surgery? . Improving blood sugar levels before and after surgery helps healing and can limit problems. . A way of improving blood sugar control is eating a healthy diet by: o  Eating less sugar and carbohydrates o  Increasing activity/exercise o  Talking with your doctor about reaching your blood sugar goals . High blood sugars (greater than 180 mg/dL) can raise your risk of infections and slow your recovery, so you will need to focus on controlling your diabetes during the  weeks before surgery. . Make sure that the doctor who takes care of your diabetes knows about your planned surgery including the date and location.  How do I manage my blood sugar before surgery? . Check your blood sugar at least 4 times a day, starting 2 days before surgery, to make sure that the level is not too high or low. o Check your blood sugar the morning of your surgery when you wake up and every 2 hours until you get to the Short Stay unit. . If your blood sugar is less than 70 mg/dL, you will need to treat for low blood sugar: o Do not take insulin. o Treat a low blood sugar (less than 70 mg/dL) with  cup of clear juice (cranberry or apple), 4 glucose tablets, OR glucose gel. Recheck blood sugar in 15 minutes after treatment (to make sure it is greater than 70 mg/dL). If your blood sugar is not greater than 70 mg/dL on recheck, call [phone] o  for further instructions. . Report your blood sugar to the short stay nurse when you get to Short Stay.  . If you are admitted to the hospital after surgery: o Your blood sugar will be checked by the staff and you will probably be given insulin after surgery (instead of oral diabetes medicines) to make sure you have good blood sugar levels. o The goal for blood sugar control after surgery is 80-180 mg/dL.   [place]- Preparing For Surgery  Before surgery, you can play an important role. Because skin is not  sterile, your skin needs to be as free of germs as possible. You can reduce the number of germs on your skin by washing with CHG (chlorahexidine gluconate) Soap before surgery.  CHG is an antiseptic cleaner which kills germs and bonds with the skin to continue killing germs even after washing.    Oral Hygiene is also important to reduce your risk of infection.  Remember - BRUSH YOUR TEETH THE MORNING OF SURGERY WITH YOUR REGULAR TOOTHPASTE  Please do not use if you have an allergy to CHG or antibacterial soaps. If your skin becomes  reddened/irritated stop using the CHG.  Do not shave (including legs and underarms) for at least 48 hours prior to first CHG shower. It is OK to shave your face.  Please follow these instructions carefully.   1. Shower the NIGHT BEFORE SURGERY and the MORNING OF SURGERY with CHG.   2. If you chose to wash your hair, wash your hair first as usual with your normal shampoo.  3. After you shampoo, rinse your hair and body thoroughly to remove the shampoo.  4. Use CHG as you would any other liquid soap. You can apply CHG directly to the skin and wash gently with a scrungie or a clean washcloth.   5. Apply the CHG Soap to your body ONLY FROM THE NECK DOWN.  Do not use on open wounds or open sores. Avoid contact with your eyes, ears, mouth and genitals (private parts). Wash Face and genitals (private parts)  with your normal soap.  6. Wash thoroughly, paying special attention to the area where your surgery will be performed.  7. Thoroughly rinse your body with warm water from the neck down.  8. DO NOT shower/wash with your normal soap after using and rinsing off the CHG Soap.  9. Pat yourself dry with a CLEAN TOWEL.  10. Wear CLEAN PAJAMAS to bed the night before surgery, wear comfortable clothes the morning of surgery  11. Place CLEAN SHEETS on your bed the night of your first shower and DO NOT SLEEP WITH PETS.  Day of Surgery:  Do not apply any deodorants/lotions.  Please wear clean clothes to the hospital/surgery center.   Remember to brush your teeth WITH YOUR REGULAR TOOTHPASTE.    Do not wear jewelry  Do not wear lotions, powders, or colognes, or deodorant.  Men may shave face and neck.  Do not bring valuables to the hospital.  [facility] is not responsible for any belongings or valuables.  Contacts, dentures or bridgework may not be worn into surgery.  Leave your suitcase in the car.  After surgery it may be brought to your room.  For patients admitted to the hospital,  discharge time will be determined by your treatment team.  Patients discharged the day of surgery will not be allowed to drive home.   Please read over the following fact sheets that you were given.

## 2018-07-11 ENCOUNTER — Encounter (HOSPITAL_COMMUNITY)
Admission: RE | Admit: 2018-07-11 | Discharge: 2018-07-11 | Disposition: A | Discharge status: Home / Self Care | Payer: Medicare HMO | Source: Ambulatory Visit | Attending: Neurological Surgery | Admitting: Neurological Surgery

## 2018-07-11 ENCOUNTER — Ambulatory Visit (HOSPITAL_COMMUNITY)
Admission: RE | Admit: 2018-07-11 | Discharge: 2018-07-11 | Disposition: A | Discharge status: Home / Self Care | Payer: Medicare HMO | Source: Ambulatory Visit | Attending: Neurological Surgery | Admitting: Neurological Surgery

## 2018-07-11 ENCOUNTER — Other Ambulatory Visit: Payer: Self-pay

## 2018-07-11 ENCOUNTER — Encounter (HOSPITAL_COMMUNITY): Payer: Self-pay

## 2018-07-11 DIAGNOSIS — M48061 Spinal stenosis, lumbar region without neurogenic claudication: Secondary | ICD-10-CM

## 2018-07-11 LAB — TYPE AND SCREEN
ABO/RH(D): A POS
Antibody Screen: NEGATIVE

## 2018-07-11 LAB — CBC WITH DIFFERENTIAL/PLATELET
Abs Immature Granulocytes: 0.04 10*3/uL (ref 0.00–0.07)
Basophils Absolute: 0 10*3/uL (ref 0.0–0.1)
Basophils Relative: 1 %
Eosinophils Absolute: 0.1 10*3/uL (ref 0.0–0.5)
Eosinophils Relative: 2 %
HCT: 44.6 % (ref 39.0–52.0)
Hemoglobin: 14.2 g/dL (ref 13.0–17.0)
Immature Granulocytes: 1 %
Lymphocytes Relative: 20 %
Lymphs Abs: 0.8 10*3/uL (ref 0.7–4.0)
MCH: 28.3 pg (ref 26.0–34.0)
MCHC: 31.8 g/dL (ref 30.0–36.0)
MCV: 88.8 fL (ref 80.0–100.0)
Monocytes Absolute: 0.4 10*3/uL (ref 0.1–1.0)
Monocytes Relative: 11 %
NEUTROS PCT: 65 %
Neutro Abs: 2.7 10*3/uL (ref 1.7–7.7)
Platelets: 155 10*3/uL (ref 150–400)
RBC: 5.02 MIL/uL (ref 4.22–5.81)
RDW: 13.6 % (ref 11.5–15.5)
WBC: 4.1 10*3/uL (ref 4.0–10.5)
nRBC: 0 % (ref 0.0–0.2)

## 2018-07-11 LAB — BASIC METABOLIC PANEL
ANION GAP: 8 (ref 5–15)
BUN: 13 mg/dL (ref 8–23)
CO2: 23 mmol/L (ref 22–32)
Calcium: 9.4 mg/dL (ref 8.9–10.3)
Chloride: 108 mmol/L (ref 98–111)
Creatinine, Ser: 1.21 mg/dL (ref 0.61–1.24)
GFR calc Af Amer: >60 mL/min (ref >60)
GFR calc non Af Amer: >60 mL/min (ref >60)
GLUCOSE: 144 mg/dL — AB (ref 70–99)
Potassium: 3.7 mmol/L (ref 3.5–5.1)
Sodium: 139 mmol/L (ref 135–145)

## 2018-07-11 LAB — GLUCOSE, CAPILLARY: Glucose-Capillary: 141 mg/dL — ABNORMAL HIGH (ref 70–99)

## 2018-07-11 LAB — ABO/RH: ABO/RH(D): A POS

## 2018-07-11 LAB — SURGICAL PCR SCREEN
MRSA, PCR: NEGATIVE
Staphylococcus aureus: NEGATIVE

## 2018-07-11 NOTE — Progress Notes (Addendum)
PCP - Dr. Lamonte Sakai Cardiologist - Dr. Neoma Laming  Chest x-ray - 07/11/2018 EKG - 07/11/2018 Stress Test - 07/09/2018  ECHO - 07/09/2018 Cardiac Cath - denies  Sleep Study - pt stated "[facility] at [facility]" info requested CPAP - Yes  Fasting Blood Sugar - 90 - 150 Checks Blood Sugar 1 time a day  Blood Thinner Instructions: Xarelto, stop 3 days prior to surgery per cardiologist Aspirin Instructions: N/A  Anesthesia review: Yes, cardiac records requested  Patient denies shortness of breath, fever, cough and chest pain at PAT appointment  Patient verbalized understanding of instructions that were given to them at the PAT appointment. Patient was also instructed that they will need to review over the PAT instructions again at home before surgery.

## 2018-07-12 NOTE — Anesthesia Preprocedure Evaluation (Addendum)
Anesthesia Evaluation  Patient identified by MRN, date of birth, ID band Patient awake    Reviewed: Allergy & Precautions, NPO status , Patient's Chart, lab work & pertinent test results  Airway Mallampati: I  TM Distance: >3 FB Neck ROM: Full    Dental   Pulmonary sleep apnea ,    Pulmonary exam normal        Cardiovascular hypertension, Pt. on medications Normal cardiovascular exam+ dysrhythmias Atrial Fibrillation      Neuro/Psych    GI/Hepatic GERD  Medicated,  Endo/Other  diabetes, Type 2, Oral Hypoglycemic Agents  Renal/GU      Musculoskeletal   Abdominal   Peds  Hematology   Anesthesia Other Findings   Reproductive/Obstetrics                            Anesthesia Physical Anesthesia Plan  ASA: II  Anesthesia Plan: General   Post-op Pain Management:    Induction: Intravenous  PONV Risk Score and Plan: 2 and Ondansetron and Midazolam  Airway Management Planned: Oral ETT  Additional Equipment:   Intra-op Plan:   Post-operative Plan: Extubation in OR  Informed Consent: I have reviewed the patients History and Physical, chart, labs and discussed the procedure including the risks, benefits and alternatives for the proposed anesthesia with the patient or authorized representative who has indicated his/her understanding and acceptance.       Plan Discussed with: CRNA and Surgeon  Anesthesia Plan Comments: (Preop cardiac eval by Dr. Neoma Laming. Cleared pt 07/09/18 stating "Proceed with surgery of back, advise stopping xarelto 3 days prior to surgery. Low risk for surgery.Marland KitchenECHO today had LVEF 66%, mild to mod MR. Stress test no ischemia." Copy of tests on pt chart.  -Nuclear stress 07/05/18 showed EF 62%, small mild partially reversible inferior wall defect, normal wall motion. Equivocal stress test with normal LVEF. -TTE 07/09/18 showed EF 66%, normal LV wall motion, severely  dilated left atrium, grade 1 dd, mod PR, mild-mod TR, mild-mod MR, mild Pulm HTN, mild AR.)       Anesthesia Quick Evaluation

## 2018-07-16 ENCOUNTER — Inpatient Hospital Stay (HOSPITAL_COMMUNITY): Payer: Medicare HMO

## 2018-07-16 ENCOUNTER — Other Ambulatory Visit: Payer: Self-pay

## 2018-07-16 ENCOUNTER — Encounter (HOSPITAL_COMMUNITY): Payer: Self-pay | Admitting: Neurological Surgery

## 2018-07-16 ENCOUNTER — Inpatient Hospital Stay (HOSPITAL_COMMUNITY): Payer: Medicare HMO | Admitting: Vascular Surgery

## 2018-07-16 ENCOUNTER — Inpatient Hospital Stay (HOSPITAL_COMMUNITY)
Admission: RE | Admit: 2018-07-16 | Discharge: 2018-07-17 | DRG: 460 | Disposition: A | Discharge status: Home Health | Payer: Medicare HMO | Attending: Neurological Surgery | Admitting: Neurological Surgery

## 2018-07-16 ENCOUNTER — Encounter (HOSPITAL_COMMUNITY)
Admission: RE | Disposition: A | Discharge status: Home Health | Payer: Self-pay | Source: Home / Self Care | Attending: Neurological Surgery

## 2018-07-16 ENCOUNTER — Inpatient Hospital Stay (HOSPITAL_COMMUNITY): Payer: Medicare HMO | Admitting: Anesthesiology

## 2018-07-16 DIAGNOSIS — I10 Essential (primary) hypertension: Secondary | ICD-10-CM | POA: Diagnosis present

## 2018-07-16 DIAGNOSIS — Z888 Allergy status to other drugs, medicaments and biological substances status: Secondary | ICD-10-CM

## 2018-07-16 DIAGNOSIS — Z419 Encounter for procedure for purposes other than remedying health state, unspecified: Secondary | ICD-10-CM

## 2018-07-16 DIAGNOSIS — Z96651 Presence of right artificial knee joint: Secondary | ICD-10-CM | POA: Diagnosis present

## 2018-07-16 DIAGNOSIS — K219 Gastro-esophageal reflux disease without esophagitis: Secondary | ICD-10-CM | POA: Diagnosis present

## 2018-07-16 DIAGNOSIS — E119 Type 2 diabetes mellitus without complications: Secondary | ICD-10-CM | POA: Diagnosis present

## 2018-07-16 DIAGNOSIS — E7800 Pure hypercholesterolemia, unspecified: Secondary | ICD-10-CM | POA: Diagnosis present

## 2018-07-16 DIAGNOSIS — M48061 Spinal stenosis, lumbar region without neurogenic claudication: Principal | ICD-10-CM | POA: Diagnosis present

## 2018-07-16 DIAGNOSIS — Z885 Allergy status to narcotic agent status: Secondary | ICD-10-CM | POA: Diagnosis not present

## 2018-07-16 DIAGNOSIS — Z833 Family history of diabetes mellitus: Secondary | ICD-10-CM

## 2018-07-16 DIAGNOSIS — M532X6 Spinal instabilities, lumbar region: Secondary | ICD-10-CM | POA: Diagnosis present

## 2018-07-16 DIAGNOSIS — Z85828 Personal history of other malignant neoplasm of skin: Secondary | ICD-10-CM | POA: Diagnosis not present

## 2018-07-16 DIAGNOSIS — Z7984 Long term (current) use of oral hypoglycemic drugs: Secondary | ICD-10-CM

## 2018-07-16 DIAGNOSIS — G4730 Sleep apnea, unspecified: Secondary | ICD-10-CM | POA: Diagnosis present

## 2018-07-16 DIAGNOSIS — Z7901 Long term (current) use of anticoagulants: Secondary | ICD-10-CM | POA: Diagnosis not present

## 2018-07-16 DIAGNOSIS — Z8249 Family history of ischemic heart disease and other diseases of the circulatory system: Secondary | ICD-10-CM | POA: Diagnosis not present

## 2018-07-16 DIAGNOSIS — Z981 Arthrodesis status: Secondary | ICD-10-CM

## 2018-07-16 DIAGNOSIS — E785 Hyperlipidemia, unspecified: Secondary | ICD-10-CM | POA: Diagnosis present

## 2018-07-16 DIAGNOSIS — I4891 Unspecified atrial fibrillation: Secondary | ICD-10-CM | POA: Diagnosis present

## 2018-07-16 DIAGNOSIS — D689 Coagulation defect, unspecified: Secondary | ICD-10-CM | POA: Diagnosis present

## 2018-07-16 DIAGNOSIS — Z806 Family history of leukemia: Secondary | ICD-10-CM

## 2018-07-16 DIAGNOSIS — Z79899 Other long term (current) drug therapy: Secondary | ICD-10-CM

## 2018-07-16 DIAGNOSIS — N400 Enlarged prostate without lower urinary tract symptoms: Secondary | ICD-10-CM | POA: Diagnosis present

## 2018-07-16 HISTORY — PX: LAMINECTOMY WITH POSTERIOR LATERAL ARTHRODESIS LEVEL 2: SHX6336

## 2018-07-16 HISTORY — DX: Spinal stenosis, lumbar region without neurogenic claudication: M48.061

## 2018-07-16 LAB — GLUCOSE, CAPILLARY
Glucose-Capillary: 133 mg/dL — ABNORMAL HIGH (ref 70–99)
Glucose-Capillary: 138 mg/dL — ABNORMAL HIGH (ref 70–99)
Glucose-Capillary: 119 mg/dL — ABNORMAL HIGH (ref 70–99)
Glucose-Capillary: 137 mg/dL — ABNORMAL HIGH (ref 70–99)
Glucose-Capillary: 253 mg/dL — ABNORMAL HIGH (ref 70–99)

## 2018-07-16 LAB — PROTIME-INR
INR: 1.1 (ref 0.8–1.2)
Prothrombin Time: 13.6 seconds (ref 11.4–15.2)

## 2018-07-16 SURGERY — LAMINECTOMY WITH POSTERIOR LATERAL ARTHRODESIS LEVEL 2
Anesthesia: General | Site: Spine Lumbar

## 2018-07-16 MED ORDER — HYDROMORPHONE HCL 1 MG/ML IJ SOLN: 0.5000 mg | INTRAMUSCULAR | Status: DC | PRN

## 2018-07-16 MED ORDER — HYDROMORPHONE HCL 1 MG/ML IJ SOLN
0.2500 mg | INTRAMUSCULAR | Status: DC | PRN
  Administered 2018-07-16 (×3): 0.5 mg via INTRAVENOUS

## 2018-07-16 MED ORDER — OXYCODONE HCL 5 MG PO TABS: 5.0000 mg | ORAL_TABLET | ORAL | Status: DC | PRN

## 2018-07-16 MED ORDER — THROMBIN 5000 UNITS EX SOLR
CUTANEOUS | Status: AC
  Filled 2018-07-16: qty 5000

## 2018-07-16 MED ORDER — ONDANSETRON HCL 4 MG PO TABS: 4.0000 mg | ORAL_TABLET | Freq: Four times a day (QID) | ORAL | Status: DC | PRN

## 2018-07-16 MED ORDER — ONDANSETRON HCL 4 MG/2ML IJ SOLN
INTRAMUSCULAR | Status: DC | PRN
  Administered 2018-07-16: 4 mg via INTRAVENOUS

## 2018-07-16 MED ORDER — AMIODARONE HCL 200 MG PO TABS
200.0000 mg | ORAL_TABLET | Freq: Every day | ORAL | Status: DC
  Filled 2018-07-16: qty 1

## 2018-07-16 MED ORDER — THROMBIN 5000 UNITS EX SOLR
OROMUCOSAL | Status: DC | PRN
  Administered 2018-07-16: 14:00:00

## 2018-07-16 MED ORDER — TAMSULOSIN HCL 0.4 MG PO CAPS
0.4000 mg | ORAL_CAPSULE | Freq: Every day | ORAL | Status: DC
  Administered 2018-07-17: 0.4 mg via ORAL
  Filled 2018-07-16: qty 1

## 2018-07-16 MED ORDER — CEFAZOLIN SODIUM-DEXTROSE 2-4 GM/100ML-% IV SOLN
INTRAVENOUS | Status: AC
  Filled 2018-07-16: qty 100

## 2018-07-16 MED ORDER — THROMBIN 20000 UNITS EX SOLR
CUTANEOUS | Status: AC
  Filled 2018-07-16: qty 20000

## 2018-07-16 MED ORDER — ACETAMINOPHEN 325 MG PO TABS: 650.0000 mg | ORAL_TABLET | ORAL | Status: DC | PRN

## 2018-07-16 MED ORDER — FUROSEMIDE 20 MG PO TABS: 20.0000 mg | ORAL_TABLET | Freq: Every day | ORAL | Status: DC | PRN

## 2018-07-16 MED ORDER — SODIUM CHLORIDE 0.9 % IV SOLN: 250.0000 mL | INTRAVENOUS | Status: DC

## 2018-07-16 MED ORDER — LACTATED RINGERS IV SOLN
INTRAVENOUS | Status: DC
  Administered 2018-07-16 (×2): via INTRAVENOUS

## 2018-07-16 MED ORDER — EPHEDRINE SULFATE 50 MG/ML IJ SOLN
INTRAMUSCULAR | Status: DC | PRN
  Administered 2018-07-16: 10 mg via INTRAVENOUS
  Administered 2018-07-16: 5 mg via INTRAVENOUS
  Administered 2018-07-16: 10 mg via INTRAVENOUS

## 2018-07-16 MED ORDER — MIDAZOLAM HCL 2 MG/2ML IJ SOLN
INTRAMUSCULAR | Status: AC
  Filled 2018-07-16: qty 2

## 2018-07-16 MED ORDER — POTASSIUM CHLORIDE ER 10 MEQ PO TBCR
10.0000 meq | EXTENDED_RELEASE_TABLET | Freq: Every day | ORAL | Status: DC
  Administered 2018-07-16: 10 meq via ORAL
  Filled 2018-07-16 (×2): qty 1

## 2018-07-16 MED ORDER — METOPROLOL SUCCINATE ER 25 MG PO TB24
25.0000 mg | ORAL_TABLET | ORAL | Status: DC
  Administered 2018-07-17: 25 mg via ORAL
  Filled 2018-07-16: qty 1

## 2018-07-16 MED ORDER — DEXAMETHASONE SODIUM PHOSPHATE 10 MG/ML IJ SOLN
10.0000 mg | Freq: Once | INTRAMUSCULAR | Status: AC
  Administered 2018-07-16: 10 mg via INTRAVENOUS

## 2018-07-16 MED ORDER — MENTHOL 3 MG MT LOZG: 1.0000 | LOZENGE | OROMUCOSAL | Status: DC | PRN

## 2018-07-16 MED ORDER — CHLORHEXIDINE GLUCONATE CLOTH 2 % EX PADS: 6.0000 | MEDICATED_PAD | Freq: Once | CUTANEOUS | Status: DC

## 2018-07-16 MED ORDER — SODIUM CHLORIDE 0.9 % IV SOLN
INTRAVENOUS | Status: DC | PRN
  Administered 2018-07-16: 35 ug/min via INTRAVENOUS

## 2018-07-16 MED ORDER — MEPERIDINE HCL 50 MG/ML IJ SOLN: 6.2500 mg | INTRAMUSCULAR | Status: DC | PRN

## 2018-07-16 MED ORDER — ONDANSETRON HCL 4 MG/2ML IJ SOLN: 4.0000 mg | Freq: Once | INTRAMUSCULAR | Status: DC | PRN

## 2018-07-16 MED ORDER — ONDANSETRON HCL 4 MG/2ML IJ SOLN: 4.0000 mg | Freq: Four times a day (QID) | INTRAMUSCULAR | Status: DC | PRN

## 2018-07-16 MED ORDER — 0.9 % SODIUM CHLORIDE (POUR BTL) OPTIME
TOPICAL | Status: DC | PRN
  Administered 2018-07-16: 1000 mL

## 2018-07-16 MED ORDER — ROCURONIUM BROMIDE 10 MG/ML (PF) SYRINGE
PREFILLED_SYRINGE | INTRAVENOUS | Status: DC | PRN
  Administered 2018-07-16: 50 mg via INTRAVENOUS

## 2018-07-16 MED ORDER — FENTANYL CITRATE (PF) 250 MCG/5ML IJ SOLN
INTRAMUSCULAR | Status: AC
  Filled 2018-07-16: qty 5

## 2018-07-16 MED ORDER — METFORMIN HCL 500 MG PO TABS
500.0000 mg | ORAL_TABLET | Freq: Two times a day (BID) | ORAL | Status: DC
  Administered 2018-07-16 – 2018-07-17 (×2): 500 mg via ORAL
  Filled 2018-07-16 (×2): qty 1

## 2018-07-16 MED ORDER — DEXAMETHASONE SODIUM PHOSPHATE 10 MG/ML IJ SOLN
10.0000 mg | INTRAMUSCULAR | Status: AC
  Administered 2018-07-16: 10 mg via INTRAVENOUS

## 2018-07-16 MED ORDER — MIDAZOLAM HCL 5 MG/5ML IJ SOLN
INTRAMUSCULAR | Status: DC | PRN
  Administered 2018-07-16: 2 mg via INTRAVENOUS

## 2018-07-16 MED ORDER — SENNA 8.6 MG PO TABS
1.0000 | ORAL_TABLET | Freq: Two times a day (BID) | ORAL | Status: DC
  Administered 2018-07-16: 8.6 mg via ORAL
  Filled 2018-07-16 (×2): qty 1

## 2018-07-16 MED ORDER — ACETAMINOPHEN 650 MG RE SUPP: 650.0000 mg | RECTAL | Status: DC | PRN

## 2018-07-16 MED ORDER — DIPHENHYDRAMINE HCL 50 MG/ML IJ SOLN
INTRAMUSCULAR | Status: AC
  Administered 2018-07-16: 50 mg
  Filled 2018-07-16: qty 1

## 2018-07-16 MED ORDER — PROPOFOL 10 MG/ML IV BOLUS
INTRAVENOUS | Status: DC | PRN
  Administered 2018-07-16: 110 mg via INTRAVENOUS

## 2018-07-16 MED ORDER — METHOCARBAMOL 1000 MG/10ML IJ SOLN
500.0000 mg | Freq: Four times a day (QID) | INTRAVENOUS | Status: DC | PRN
  Filled 2018-07-16: qty 5

## 2018-07-16 MED ORDER — CEFAZOLIN SODIUM-DEXTROSE 2-4 GM/100ML-% IV SOLN
2.0000 g | Freq: Three times a day (TID) | INTRAVENOUS | Status: AC
  Administered 2018-07-16 – 2018-07-17 (×2): 2 g via INTRAVENOUS
  Filled 2018-07-16 (×2): qty 100

## 2018-07-16 MED ORDER — VANCOMYCIN HCL 1000 MG IV SOLR
INTRAVENOUS | Status: AC
  Filled 2018-07-16: qty 1000

## 2018-07-16 MED ORDER — BUPIVACAINE HCL (PF) 0.25 % IJ SOLN
INTRAMUSCULAR | Status: AC
  Filled 2018-07-16: qty 30

## 2018-07-16 MED ORDER — VANCOMYCIN HCL 1000 MG IV SOLR
INTRAVENOUS | Status: DC | PRN
  Administered 2018-07-16: 1000 mg

## 2018-07-16 MED ORDER — METHOCARBAMOL 500 MG PO TABS
500.0000 mg | ORAL_TABLET | Freq: Four times a day (QID) | ORAL | Status: DC | PRN
  Administered 2018-07-16: 500 mg via ORAL

## 2018-07-16 MED ORDER — OXYCODONE HCL 5 MG PO TABS
5.0000 mg | ORAL_TABLET | ORAL | Status: DC | PRN
  Administered 2018-07-16 – 2018-07-17 (×4): 10 mg via ORAL
  Filled 2018-07-16 (×4): qty 2

## 2018-07-16 MED ORDER — SODIUM CHLORIDE 0.9% FLUSH: 3.0000 mL | Freq: Two times a day (BID) | INTRAVENOUS | Status: DC

## 2018-07-16 MED ORDER — HYDROCHLOROTHIAZIDE 25 MG PO TABS
25.0000 mg | ORAL_TABLET | ORAL | Status: DC
  Administered 2018-07-17: 25 mg via ORAL
  Filled 2018-07-16: qty 1

## 2018-07-16 MED ORDER — HYDROMORPHONE HCL 1 MG/ML IJ SOLN
INTRAMUSCULAR | Status: AC
  Filled 2018-07-16: qty 1

## 2018-07-16 MED ORDER — PHENOL 1.4 % MT LIQD: 1.0000 | OROMUCOSAL | Status: DC | PRN

## 2018-07-16 MED ORDER — PHENYLEPHRINE 40 MCG/ML (10ML) SYRINGE FOR IV PUSH (FOR BLOOD PRESSURE SUPPORT)
PREFILLED_SYRINGE | INTRAVENOUS | Status: DC | PRN
  Administered 2018-07-16: 60 ug via INTRAVENOUS

## 2018-07-16 MED ORDER — INSULIN ASPART 100 UNIT/ML ~~LOC~~ SOLN: 0.0000 [IU] | Freq: Three times a day (TID) | SUBCUTANEOUS | Status: DC

## 2018-07-16 MED ORDER — SODIUM CHLORIDE 0.9% FLUSH: 3.0000 mL | INTRAVENOUS | Status: DC | PRN

## 2018-07-16 MED ORDER — CEFAZOLIN SODIUM-DEXTROSE 2-4 GM/100ML-% IV SOLN
2.0000 g | INTRAVENOUS | Status: AC
  Administered 2018-07-16: 2 g via INTRAVENOUS

## 2018-07-16 MED ORDER — CELECOXIB 200 MG PO CAPS
200.0000 mg | ORAL_CAPSULE | Freq: Two times a day (BID) | ORAL | Status: DC
  Administered 2018-07-16 – 2018-07-17 (×2): 200 mg via ORAL
  Filled 2018-07-16 (×2): qty 1

## 2018-07-16 MED ORDER — HYDRALAZINE HCL 10 MG PO TABS
50.0000 mg | ORAL_TABLET | Freq: Every day | ORAL | Status: DC
  Administered 2018-07-16 – 2018-07-17 (×2): 50 mg via ORAL
  Filled 2018-07-16 (×2): qty 5

## 2018-07-16 MED ORDER — BUPIVACAINE HCL (PF) 0.25 % IJ SOLN
INTRAMUSCULAR | Status: DC | PRN
  Administered 2018-07-16: 10 mL

## 2018-07-16 MED ORDER — SUCCINYLCHOLINE CHLORIDE 20 MG/ML IJ SOLN
INTRAMUSCULAR | Status: DC | PRN
  Administered 2018-07-16: 120 mg via INTRAVENOUS

## 2018-07-16 MED ORDER — METHOCARBAMOL 500 MG PO TABS
ORAL_TABLET | ORAL | Status: AC
  Filled 2018-07-16: qty 1

## 2018-07-16 MED ORDER — PANTOPRAZOLE SODIUM 40 MG PO TBEC
40.0000 mg | DELAYED_RELEASE_TABLET | Freq: Every day | ORAL | Status: DC
  Administered 2018-07-17: 40 mg via ORAL
  Filled 2018-07-16: qty 1

## 2018-07-16 MED ORDER — LIDOCAINE 2% (20 MG/ML) 5 ML SYRINGE
INTRAMUSCULAR | Status: DC | PRN
  Administered 2018-07-16: 80 mg via INTRAVENOUS

## 2018-07-16 MED ORDER — GLYCOPYRROLATE 0.2 MG/ML IJ SOLN
INTRAMUSCULAR | Status: DC | PRN
  Administered 2018-07-16 (×2): 0.2 mg via INTRAVENOUS

## 2018-07-16 MED ORDER — THROMBIN 20000 UNITS EX SOLR
OROMUCOSAL | Status: DC | PRN
  Administered 2018-07-16: 14:00:00

## 2018-07-16 MED ORDER — ONDANSETRON HCL 4 MG/2ML IJ SOLN
INTRAMUSCULAR | Status: AC
  Filled 2018-07-16: qty 2

## 2018-07-16 MED ORDER — POTASSIUM CHLORIDE IN NACL 20-0.9 MEQ/L-% IV SOLN: INTRAVENOUS | Status: DC

## 2018-07-16 MED ORDER — FENTANYL CITRATE (PF) 250 MCG/5ML IJ SOLN
INTRAMUSCULAR | Status: DC | PRN
  Administered 2018-07-16: 100 ug via INTRAVENOUS
  Administered 2018-07-16: 50 ug via INTRAVENOUS
  Administered 2018-07-16 (×2): 100 ug via INTRAVENOUS

## 2018-07-16 MED ORDER — PROPOFOL 10 MG/ML IV BOLUS
INTRAVENOUS | Status: AC
  Filled 2018-07-16: qty 20

## 2018-07-16 MED ORDER — SUGAMMADEX SODIUM 200 MG/2ML IV SOLN
INTRAVENOUS | Status: DC | PRN
  Administered 2018-07-16: 230 mg via INTRAVENOUS

## 2018-07-16 MED ORDER — DIPHENHYDRAMINE HCL 50 MG/ML IJ SOLN: 50.0000 mg | Freq: Once | INTRAMUSCULAR | Status: AC

## 2018-07-16 SURGICAL SUPPLY — 70 items
ADH SKN CLS APL DERMABOND .7 (GAUZE/BANDAGES/DRESSINGS) ×1
APL SKNCLS STERI-STRIP NONHPOA (GAUZE/BANDAGES/DRESSINGS) ×1
BAG DECANTER FOR FLEXI CONT (MISCELLANEOUS) ×3 IMPLANT
BASKET BONE COLLECTION (BASKET) ×3 IMPLANT
BENZOIN TINCTURE PRP APPL 2/3 (GAUZE/BANDAGES/DRESSINGS) ×3 IMPLANT
BLADE CLIPPER SURG (BLADE) IMPLANT
BONE VIVIGEN FORMABLE 5.4CC (Bone Implant) ×3 IMPLANT
BUR MATCHSTICK NEURO 3.0 LAGG (BURR) ×3 IMPLANT
CANISTER SUCT 3000ML PPV (MISCELLANEOUS) ×3 IMPLANT
CARTRIDGE OIL MAESTRO DRILL (MISCELLANEOUS) ×1 IMPLANT
CLIP SPRING STIM LLIF SAFEOP (CLIP) ×3 IMPLANT
CLOSURE WOUND 1/2 X4 (GAUZE/BANDAGES/DRESSINGS) ×1
CONT SPEC 4OZ CLIKSEAL STRL BL (MISCELLANEOUS) ×3 IMPLANT
COVER BACK TABLE 60X90IN (DRAPES) ×3 IMPLANT
COVER WAND RF STERILE (DRAPES) IMPLANT
DERMABOND ADVANCED (GAUZE/BANDAGES/DRESSINGS) ×2
DERMABOND ADVANCED .7 DNX12 (GAUZE/BANDAGES/DRESSINGS) ×1 IMPLANT
DIFFUSER DRILL AIR PNEUMATIC (MISCELLANEOUS) ×3 IMPLANT
DRAPE C-ARM 42X72 X-RAY (DRAPES) ×3 IMPLANT
DRAPE LAPAROTOMY 100X72X124 (DRAPES) ×3 IMPLANT
DRAPE POUCH INSTRU U-SHP 10X18 (DRAPES) ×3 IMPLANT
DRAPE SURG 17X23 STRL (DRAPES) ×3 IMPLANT
DRSG OPSITE POSTOP 4X6 (GAUZE/BANDAGES/DRESSINGS) ×3 IMPLANT
DURAPREP 26ML APPLICATOR (WOUND CARE) ×3 IMPLANT
ELECT KIT SAFEOP EMG/NMJ (ELECTRODE) ×3
ELECT REM PT RETURN 9FT ADLT (ELECTROSURGICAL) ×3
ELECTRODE REM PT RTRN 9FT ADLT (ELECTROSURGICAL) ×1 IMPLANT
EVACUATOR 1/8 PVC DRAIN (DRAIN) IMPLANT
GAUZE 4X4 16PLY RFD (DISPOSABLE) IMPLANT
GLOVE BIO SURGEON STRL SZ7 (GLOVE) IMPLANT
GLOVE BIO SURGEON STRL SZ8 (GLOVE) ×6 IMPLANT
GLOVE BIOGEL PI IND STRL 6.5 (GLOVE) ×1 IMPLANT
GLOVE BIOGEL PI IND STRL 7.0 (GLOVE) ×4 IMPLANT
GLOVE BIOGEL PI IND STRL 7.5 (GLOVE) ×2 IMPLANT
GLOVE BIOGEL PI INDICATOR 6.5 (GLOVE) ×2
GLOVE BIOGEL PI INDICATOR 7.0 (GLOVE) ×8
GLOVE BIOGEL PI INDICATOR 7.5 (GLOVE) ×4
GLOVE SS N UNI LF 6.5 STRL (GLOVE) ×9 IMPLANT
GLOVE SURG SS PI 8.0 STRL IVOR (GLOVE) ×6 IMPLANT
GOWN STRL REUS W/ TWL LRG LVL3 (GOWN DISPOSABLE) ×1 IMPLANT
GOWN STRL REUS W/ TWL XL LVL3 (GOWN DISPOSABLE) ×1 IMPLANT
GOWN STRL REUS W/TWL 2XL LVL3 (GOWN DISPOSABLE) IMPLANT
GOWN STRL REUS W/TWL LRG LVL3 (GOWN DISPOSABLE) ×3
GOWN STRL REUS W/TWL XL LVL3 (GOWN DISPOSABLE) ×3
HEMOSTAT POWDER KIT SURGIFOAM (HEMOSTASIS) ×3 IMPLANT
KIT BASIN OR (CUSTOM PROCEDURE TRAY) ×3 IMPLANT
KIT EMG SRFC ELECT SAFEOP (ELECTRODE) ×1 IMPLANT
KIT TURNOVER KIT B (KITS) ×3 IMPLANT
MILL MEDIUM DISP (BLADE) ×3 IMPLANT
NEEDLE HYPO 25X1 1.5 SAFETY (NEEDLE) ×3 IMPLANT
NS IRRIG 1000ML POUR BTL (IV SOLUTION) ×6 IMPLANT
OIL CARTRIDGE MAESTRO DRILL (MISCELLANEOUS) ×3
PACK LAMINECTOMY NEURO (CUSTOM PROCEDURE TRAY) ×3 IMPLANT
PAD ARMBOARD 7.5X6 YLW CONV (MISCELLANEOUS) ×9 IMPLANT
ROD TI LORDOTIC 5.5X45 (Rod) ×6 IMPLANT
SCREW KODIAK 6.5X50MM (Screw) ×12 IMPLANT
SET SCREW (Screw) ×8 IMPLANT
SET SCREW SPNE (Screw) ×4 IMPLANT
SPONGE LAP 4X18 RFD (DISPOSABLE) IMPLANT
SPONGE SURGIFOAM ABS GEL 100 (HEMOSTASIS) ×3 IMPLANT
STRIP BIOACTIVE 20CC 25X100X8 (Miscellaneous) ×3 IMPLANT
STRIP CLOSURE SKIN 1/2X4 (GAUZE/BANDAGES/DRESSINGS) ×2 IMPLANT
SUT VIC AB 0 CT1 18XCR BRD8 (SUTURE) ×1 IMPLANT
SUT VIC AB 0 CT1 8-18 (SUTURE) ×3
SUT VIC AB 2-0 CP2 18 (SUTURE) ×6 IMPLANT
SUT VIC AB 3-0 SH 8-18 (SUTURE) ×6 IMPLANT
TOWEL GREEN STERILE (TOWEL DISPOSABLE) ×3 IMPLANT
TOWEL GREEN STERILE FF (TOWEL DISPOSABLE) ×3 IMPLANT
TRAY FOLEY MTR SLVR 16FR STAT (SET/KITS/TRAYS/PACK) ×3 IMPLANT
WATER STERILE IRR 1000ML POUR (IV SOLUTION) ×3 IMPLANT

## 2018-07-16 NOTE — Addendum Note (Signed)
Addendum  created 07/16/18 1745 by Effie Berkshire, MD   Clinical Note Signed

## 2018-07-16 NOTE — Progress Notes (Signed)
Re: Swollen Tongue  S: Pt complaint of right swollen tongue and inability to speak clearly. Pt denies difficulty breathing and/or swallowing. He states he's experienced similar event in the past which was responsive to steroids and benadryl.  O: NAD, AF, VSS CV: RRR, no m/r/g Pulm: CTAB Face/Body: no hives, no additional swelling  A/P: 68 y/o M POD#0 s/p PLIF presenting with R sided tongue swelling.  -  Pt did not receive ACE-I or medication known to be an allergy -  Swelling does not appear to have traumatic etiology -  RN administered IV dexamethasone and diphenhydramine, swelling improving per patient. -  Will continue to monitor, recommend continuous pulse oximetry due to sedative effects of diphenhydramine, frequent checks by RN to monitor worsening/improvement of swelling.  -  RN agreeable to plan and will notify on call anesthesiologist if no improvement.   Crissie Sickles Smith Robert, MD, [facility] Anesthesiology    On call Anesthesiologist [phone]): Dr. Linna Caprice - [phone]

## 2018-07-16 NOTE — Anesthesia Postprocedure Evaluation (Signed)
Anesthesia Post Note  Patient: Brandon Gibson  Procedure(s) Performed: Posterior lumbar fusion with instrumentation at L3-4 with repeat facetectomy L3-4 (N/A Spine Lumbar)     Patient location during evaluation: PACU Anesthesia Type: General Level of consciousness: awake and alert Pain management: pain level controlled Vital Signs Assessment: post-procedure vital signs reviewed and stable Respiratory status: spontaneous breathing, nonlabored ventilation, respiratory function stable and patient connected to nasal cannula oxygen Cardiovascular status: blood pressure returned to baseline and stable Postop Assessment: no apparent nausea or vomiting Anesthetic complications: no    Last Vitals:  Vitals:   07/16/18 1620 07/16/18 1638  BP: (!) 143/80 (!) 145/75  Pulse: (!) 55 (!) 56  Resp: 13 19  Temp:  36.6 C  SpO2: 96%     Last Pain:  Vitals:   07/16/18 1638  TempSrc: Oral  PainSc:                  [name] DAVID

## 2018-07-16 NOTE — Anesthesia Procedure Notes (Signed)
Procedure Name: Intubation Date/Time: 07/16/2018 12:52 PM Performed by: Mariea Clonts, CRNA Pre-anesthesia Checklist: Patient identified, Emergency Drugs available, Suction available and Patient being monitored Patient Re-evaluated:Patient Re-evaluated prior to induction Oxygen Delivery Method: Circle System Utilized Preoxygenation: Pre-oxygenation with 100% oxygen Induction Type: IV induction Ventilation: Mask ventilation without difficulty Laryngoscope Size: Miller and 3 Grade View: Grade II Tube type: Oral Tube size: 8.0 mm Number of attempts: 1 Airway Equipment and Method: Stylet and Oral airway Placement Confirmation: ETT inserted through vocal cords under direct vision,  positive ETCO2 and breath sounds checked- equal and bilateral Tube secured with: Tape Dental Injury: Teeth and Oropharynx as per pre-operative assessment

## 2018-07-16 NOTE — Transfer of Care (Signed)
Immediate Anesthesia Transfer of Care Note  Patient: Brandon Gibson Biljon  Procedure(s) Performed: Posterior lumbar fusion with instrumentation at L3-4 with repeat facetectomy L3-4 (N/A Spine Lumbar)  Patient Location: PACU  Anesthesia Type:General  Level of Consciousness: awake and patient cooperative  Airway & Oxygen Therapy: Patient Spontanous Breathing  Post-op Assessment: Report given to RN and Post -op Vital signs reviewed and stable  Post vital signs: Reviewed and stable  Last Vitals:  Vitals Value Taken Time  BP 164/136 07/16/2018  3:35 PM  Temp    Pulse 73 07/16/2018  3:37 PM  Resp 16 07/16/2018  3:37 PM  SpO2 96 % 07/16/2018  3:37 PM  Vitals shown include unvalidated device data.  Last Pain:  Vitals:   07/16/18 0954  TempSrc:   PainSc: 0-No pain      Patients Stated Pain Goal: 4 (24/26/83 4196)  Complications: No apparent anesthesia complications

## 2018-07-16 NOTE — H&P (Signed)
Subjective: Patient is a 68 y.o. male admitted for back surgery. Onset of symptoms was several months ago, gradually worsening since that time.  The pain is rated severe, and is located at the across the lower back and radiates to legs. The pain is described as aching and occurs all day. The symptoms have been progressive. Symptoms are exacerbated by exercise. MRI or CT showed recurrent stenosis with instability   Past Medical History:  Diagnosis Date  . Arthritis   . Atrial fibrillation ([place])   . BPH (benign prostatic hyperplasia)   . Cancer (HCC)    HX SKIN CANCER Basal cell  . Chickenpox   . Clotting disorder ([place])   . Diabetes mellitus without complication ([place])    type 2  . Dysrhythmia    IRREG HEART BEAT  . GERD (gastroesophageal reflux disease)   . H/O pleurisy   . Hypercholesteremia   . Hyperlipidemia   . Hypertension   . Iron deficiency anemia due to chronic blood loss 09/18/2017  . Measles   . Mumps   . Sleep apnea    sleep study Dr. Chancy Milroy, uses CPAP    Past Surgical History:  Procedure Laterality Date  . ANTERIOR CERVICAL DECOMP/DISCECTOMY FUSION  11/25/2011   Procedure: ANTERIOR CERVICAL DECOMPRESSION/DISCECTOMY FUSION 2 LEVELS;  Surgeon: Floyce Stakes, MD;  Location: [facility];  Service: Neurosurgery;  Laterality: N/A;  Cervical four-five,Cervical five-six  Anterior cervical decompression/diskectomy, fusion, plate  . BREAST SURGERY     lumpectomy  . CARDIAC CATHETERIZATION     2011, [facility]  . CARDIOVASCULAR STRESS TEST  2011  . CERVICAL FUSION     C 6/7   . COLONOSCOPY WITH PROPOFOL N/A 11/08/2017   Procedure: COLONOSCOPY WITH PROPOFOL;  Surgeon: Toledo, Benay Pike, MD;  Location: [facility];  Service: Gastroenterology;  Laterality: N/A;  . ESOPHAGOGASTRODUODENOSCOPY (EGD) WITH PROPOFOL N/A 11/08/2017   Procedure: ESOPHAGOGASTRODUODENOSCOPY (EGD) WITH PROPOFOL;  Surgeon: Toledo, Benay Pike, MD;  Location: [facility] ENDOSCOPY;  Service: Gastroenterology;  Laterality:  N/A;  . EYE SURGERY     LASIK  . JOINT REPLACEMENT    . KNEE ARTHROPLASTY Right 10/05/2015   Procedure: COMPUTER ASSISTED TOTAL KNEE ARTHROPLASTY;  Surgeon: Dereck Leep, MD;  Location: [facility] ORS;  Service: Orthopedics;  Laterality: Right;  . KNEE ARTHROSCOPY     Right  . KNEE ARTHROSCOPY Left 08/10/2015   Procedure: LEFT KNEE ARTHROSCOPY, CHONDROPLASTY, MEDIAL MENISECTOMY;  Surgeon: Dereck Leep, MD;  Location: [facility] ORS;  Service: Orthopedics;  Laterality: Left;  . LUMBAR LAMINECTOMY/DECOMPRESSION MICRODISCECTOMY N/A 10/04/2013   Procedure: LUMBAR TWO TO THREE LUMBAR LAMINECTOMY/DECOMPRESSION MICRODISCECTOMY 1 LEVEL;  Surgeon: Floyce Stakes, MD;  Location: [facility];  Service: Neurosurgery;  Laterality: N/A;  L2-3 Laminectomy  . POSTERIOR LAMINECTOMY / DECOMPRESSION LUMBAR SPINE    . TRANSESOPHAGEAL ECHOCARDIOGRAM  2011    Prior to Admission medications   Medication Sig Start Date End Date Taking? Authorizing Provider  amiodarone (PACERONE) 200 MG tablet Take 200 mg by mouth at bedtime.   Yes [provider]  EPINEPHrine (EPIPEN 2-PAK) 0.3 mg/0.3 mL IJ SOAJ injection Inject 0.3 mg into the muscle as needed for anaphylaxis.   Yes [provider]  furosemide (LASIX) 20 MG tablet Take 20 mg by mouth daily as needed for fluid.    Yes [provider]  hydrALAZINE (APRESOLINE) 50 MG tablet Take 50 mg by mouth daily.    Yes [provider]  hydrochlorothiazide (HYDRODIURIL) 25 MG tablet Take 25 mg  by mouth every morning.    Yes [provider]  metFORMIN (GLUCOPHAGE) 500 MG tablet Take 500 mg by mouth 2 (two) times daily with a meal.    Yes [provider]  metoprolol succinate (TOPROL-XL) 25 MG 24 hr tablet Take 25 mg by mouth every morning.   Yes [provider]  Omega-3 Fatty Acids (FISH OIL) 1000 MG CAPS Take 1,000 mg by mouth 2 (two) times daily.    Yes [provider]  omeprazole (PRILOSEC) 40 MG capsule Take 40 mg  by mouth every morning.    Yes [provider]  potassium chloride (K-DUR) 10 MEQ tablet Take 10 mEq by mouth at bedtime.    Yes [provider]  rivaroxaban (XARELTO) 20 MG TABS tablet Take 20 mg by mouth at bedtime.   Yes [provider]  rosuvastatin (CRESTOR) 20 MG tablet Take 20 mg by mouth at bedtime.    Yes [provider]  tamsulosin (FLOMAX) 0.4 MG CAPS capsule Take 1 capsule (0.4 mg total) by mouth daily. 06/15/18  Yes Stoioff, Ronda Fairly, MD  ACCU-CHEK AVIVA PLUS test strip  09/30/17   [provider]  diazepam (VALIUM) 10 MG tablet 1 tab po 30 min prior to procedure Patient not taking: Reported on 07/10/2018 03/15/18   Abbie Sons, MD   Allergies  Allergen Reactions  . Morphine And Related Anaphylaxis  . Ace Inhibitors Swelling    Social History   Tobacco Use  . Smoking status: Never Smoker  . Smokeless tobacco: Never Used  Substance Use Topics  . Alcohol use: Yes    Alcohol/week: 14.0 standard drinks    Types: 14 Glasses of wine per week    Family History  Problem Relation Age of Onset  . Leukemia Father   . Diabetes Father   . Heart disease Mother   . Prostate cancer Neg Hx   . Chronic Renal Failure Neg Hx      Review of Systems  Positive ROS: neg  All other systems have been reviewed and were otherwise negative with the exception of those mentioned in the HPI and as above.  Objective: Vital signs in last 24 hours:    General Appearance: Alert, cooperative, no distress, appears stated age Head: Normocephalic, without obvious abnormality, atraumatic Eyes: PERRL, conjunctiva/corneas clear, EOM's intact    Neck: Supple, symmetrical, trachea midline Back: Symmetric, no curvature, ROM normal, no CVA tenderness Lungs:  respirations unlabored Heart: Regular rate and rhythm Abdomen: Soft, non-tender Extremities: Extremities normal, atraumatic, no cyanosis or edema Pulses: 2+ and symmetric all extremities Skin: Skin  color, texture, turgor normal, no rashes or lesions  NEUROLOGIC:   Mental status: Alert and oriented x4,  no aphasia, good attention span, fund of knowledge, and memory Motor Exam - grossly normal Sensory Exam - grossly normal Reflexes: 1= Coordination - grossly normal Gait - grossly normal Balance - grossly normal Cranial Nerves: I: smell Not tested  II: visual acuity  OS: nl    OD: nl  II: visual fields Full to confrontation  II: pupils Equal, round, reactive to light  III,VII: ptosis None  III,IV,VI: extraocular muscles  Full ROM  V: mastication Normal  V: facial light touch sensation  Normal  V,VII: corneal reflex  Present  VII: facial muscle function - upper  Normal  VII: facial muscle function - lower Normal  VIII: hearing Not tested  IX: soft palate elevation  Normal  IX,X: gag reflex Present  XI: trapezius strength  5/5  XI: sternocleidomastoid strength 5/5  XI: neck flexion strength  5/5  XII: tongue strength  Normal    Data Review Lab Results  Component Value Date   WBC 4.1 07/11/2018   HGB 14.2 07/11/2018   HCT 44.6 07/11/2018   MCV 88.8 07/11/2018   PLT 155 07/11/2018   Lab Results  Component Value Date   NA 139 07/11/2018   K 3.7 07/11/2018   CL 108 07/11/2018   CO2 23 07/11/2018   BUN 13 07/11/2018   CREATININE 1.21 07/11/2018   GLUCOSE 144 (H) 07/11/2018   Lab Results  Component Value Date   INR 1.93 09/23/2015    Assessment/Plan:  Estimated body mass index is 35.22 kg/m as calculated from the following:   Height as of 07/11/18: 5' 10.25" (1.784 m).   Weight as of 07/11/18: 112.1 kg. Patient admitted for decompression and fusion L3-4. Patient has failed a reasonable attempt at conservative therapy.  I explained the condition and procedure to the patient and answered any questions.  Patient wishes to proceed with procedure as planned. Understands risks/ benefits and typical outcomes of procedure.   Brandon Gibson 07/16/2018 9:30 AM

## 2018-07-16 NOTE — Op Note (Signed)
07/16/2018  3:26 PM  PATIENT:  Brandon Gibson  68 y.o. 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  Redo decompressive lumbar laminectomy, hemi-facetectomy and foraminotomies L3-4  2. Posterior fixation L3-4 using Alphatec pedicle screws.  3. Intertransverse arthrodesis L3-4 using morcellized autograft and allograft.  SURGEON:  Sherley Bounds, MD  ASSISTANTS: Glenford Peers FNP  ANESTHESIA:  General  EBL: 50 ml  Total I/O In: 1000 [I.V.:1000] Out: 300 [Urine:250; Blood:50]  BLOOD ADMINISTERED:none  DRAINS: none   INDICATION FOR PROCEDURE: This patient presented with severe recurrent bilateral leg pain and back pain. Imaging revealed adjacent level stenosis L3-4 where he had had a previous full laminectomy below and L2-3 laminectomy and fusion. The patient tried a reasonable attempt at conservative medical measures without relief. I recommended decompression and instrumented fusion to address the stenosis as well as the segmental  instability.  Patient understood the risks, benefits, and alternatives and potential outcomes and wished to proceed.  PROCEDURE DETAILS:  The patient was brought to the operating room. After induction of generalized endotracheal anesthesia the patient was rolled into the prone position on chest rolls and all pressure points were padded. The patient's lumbar region was cleaned and then prepped with DuraPrep and draped in the usual sterile fashion. Anesthesia was injected and then a dorsal midline incision was made and carried down to the lumbosacral fascia. The fascia was opened and the paraspinous musculature was taken down in a subperiosteal fashion to expose L3-4. A self-retaining retractor was placed. Intraoperative fluoroscopy confirmed my level, and I started with the decompression and complete lumbar  laminectomies, hemi- facetectomies, and foraminotomies were performed at L3-4.  Significant scarring from the previous surgery.  At no time did we see spinal fluid.  The patient had significant spinal stenosis. Much more generous decompression and generous foraminotomy was undertaken in order to adequately decompress the neural elements and address the patient's leg pain. The yellow ligament was removed to expose the underlying dura and nerve roots, and generous foraminotomies were performed to adequately decompress the neural elements. Both the exiting and traversing nerve roots were decompressed on both sides until a coronary dilator passed easily along the nerve roots. Once the decompression was complete,  turned our attention to the placement of the pedicle screws. The pedicle screw entry zones were identified utilizing surface landmarks and fluoroscopy. I probed each pedicle utilizing the echo probe, and tapped each pedicle with the appropriate tap. We palpated with a ball probe to assure no break in the cortex. We then placed 6.5 x 50 mm pedicle screws into the pedicles bilaterally at L3-4. We then decorticated the transverse processes and laid a mixture of morcellized autograft and allograft out over these to perform intertransverse arthrodesis at L3-4. We then placed lordotic rods into the multiaxial screw heads of the pedicle screws and locked these in position with the locking caps and anti-torque device. We then checked our construct with AP and lateral fluoroscopy. Irrigated with copious amounts of  saline solution. Inspected the nerve roots once again to assure adequate decompression, lined to the dura with Gelfoam, placed powdered vancomycin into the wound, and closed the muscle and the fascia with 0 Vicryl. Closed the subcutaneous tissues with 2-0 Vicryl and subcuticular tissues with 3-0 Vicryl. The skin was closed with benzoin and Steri-Strips. Dressing was then applied, the patient was awakened from  general anesthesia and transported to the recovery room in stable condition. At the end of the procedure all sponge, needle and instrument counts were correct.   PLAN OF CARE: admit to inpatient  PATIENT DISPOSITION:  PACU - hemodynamically stable.   Delay start of Pharmacological VTE agent (>24hrs) due to surgical blood loss or risk of bleeding:  yes

## 2018-07-17 ENCOUNTER — Encounter (HOSPITAL_COMMUNITY): Payer: Self-pay | Admitting: Neurological Surgery

## 2018-07-17 LAB — GLUCOSE, CAPILLARY
Glucose-Capillary: 168 mg/dL — ABNORMAL HIGH (ref 70–99)
Glucose-Capillary: 86 mg/dL (ref 70–99)

## 2018-07-17 MED ORDER — METHOCARBAMOL 500 MG PO TABS: 500.0000 mg | ORAL_TABLET | Freq: Four times a day (QID) | ORAL | 1 refills | Status: DC | PRN

## 2018-07-17 MED ORDER — OXYCODONE HCL 5 MG PO TABS: 5.0000 mg | ORAL_TABLET | Freq: Four times a day (QID) | ORAL | 0 refills | Status: DC | PRN

## 2018-07-17 NOTE — Discharge Summary (Signed)
Physician Discharge Summary  Patient ID: Brandon Gibson MRN: [id number] DOB/AGE: 1950-09-02 68 y.o.  Admit date: 07/16/2018 Discharge date: 07/17/2018  Admission Diagnoses: adjacent level stenosis    Discharge Diagnoses: same   Discharged Condition: good  Hospital Course: The patient was admitted on 07/16/2018 and taken to the operating room where the patient underwent lumbar decompression and fusion  L3-4. The patient tolerated the procedure well and was taken to the recovery room and then to the floor in stable condition. The hospital course was routine. There were no complications. The wound remained clean dry and intact. Pt had appropriate back soreness. No complaints of leg pain or new N/T/W. The patient remained afebrile with stable vital signs, and tolerated a regular diet. The patient continued to increase activities, and pain was well controlled with oral pain medications.   Consults: None  Significant Diagnostic Studies:  Results for orders placed or performed during the hospital encounter of 07/16/18  Protime-INR  Result Value Ref Range   Prothrombin Time 13.6 11.4 - 15.2 seconds   INR 1.1 0.8 - 1.2  Glucose, capillary  Result Value Ref Range   Glucose-Capillary 133 (H) 70 - 99 mg/dL   Comment 1 Notify RN    Comment 2 Document in Chart   Glucose, capillary  Result Value Ref Range   Glucose-Capillary 138 (H) 70 - 99 mg/dL   Comment 1 Notify RN    Comment 2 Document in Chart   Glucose, capillary  Result Value Ref Range   Glucose-Capillary 119 (H) 70 - 99 mg/dL   Comment 1 Notify RN    Comment 2 Document in Chart   Glucose, capillary  Result Value Ref Range   Glucose-Capillary 137 (H) 70 - 99 mg/dL   Comment 1 Notify RN    Comment 2 Document in Chart   Glucose, capillary  Result Value Ref Range   Glucose-Capillary 253 (H) 70 - 99 mg/dL   Comment 1 Notify RN    Comment 2 Document in Chart   Glucose, capillary  Result Value Ref Range   Glucose-Capillary  168 (H) 70 - 99 mg/dL   Comment 1 Notify RN    Comment 2 Document in Chart     Chest 2 View  Result Date: 07/11/2018 CLINICAL DATA:  Pre-admission for lumbar surgery. EXAM: CHEST - 2 VIEW COMPARISON:  Radiographs of October 01, 2013. FINDINGS: The heart size and mediastinal contours are within normal limits. Both lungs are clear. The visualized skeletal structures are unremarkable. IMPRESSION: No active cardiopulmonary disease. Electronically Signed   By: Marijo Conception, M.D.   On: 07/11/2018 16:53   Dg Lumbar Spine Complete  Result Date: 07/16/2018 CLINICAL DATA:  68 year old male for L3-4 laminectomy and fusion. Initial encounter. EXAM: DG C-ARM 61-120 MIN; LUMBAR SPINE - COMPLETE 4+ VIEW Fluoroscopic time: 44 seconds. COMPARISON:  03/06/2018 MR. FINDINGS: Four intraoperative C-arm views submitted for review after surgery. Bilateral pedicle screws paced at the L3 and L4 level. Metallic probe on single frontal view at the upper L4 level. IMPRESSION: Surgery L3-4 level as noted above. Electronically Signed   By: Genia Del M.D.   On: 07/16/2018 15:32   Dg C-arm 1-60 Min  Result Date: 07/16/2018 CLINICAL DATA:  68 year old male for L3-4 laminectomy and fusion. Initial encounter. EXAM: DG C-ARM 61-120 MIN; LUMBAR SPINE - COMPLETE 4+ VIEW Fluoroscopic time: 44 seconds. COMPARISON:  03/06/2018 MR. FINDINGS: Four intraoperative C-arm views submitted for review after surgery. Bilateral pedicle screws paced at the  L3 and L4 level. Metallic probe on single frontal view at the upper L4 level. IMPRESSION: Surgery L3-4 level as noted above. Electronically Signed   By: Genia Del M.D.   On: 07/16/2018 15:32    Antibiotics:  Anti-infectives (From admission, onward)   Start     Dose/Rate Route Frequency Ordered Stop   07/16/18 2100  ceFAZolin (ANCEF) IVPB 2g/100 mL premix     2 g 200 mL/hr over 30 Minutes Intravenous Every 8 hours 07/16/18 1633 07/17/18 0636   07/16/18 1334  vancomycin (VANCOCIN) powder   Status:  Discontinued       As needed 07/16/18 1335 07/16/18 1531   07/16/18 0945  ceFAZolin (ANCEF) IVPB 2g/100 mL premix     2 g 200 mL/hr over 30 Minutes Intravenous On call to O.R. 07/16/18 0941 07/16/18 1319      Discharge Exam: Blood pressure (!) 153/80, pulse 68, temperature 98 F (36.7 C), temperature source Oral, resp. rate 20, height 5' 10.25" (1.784 m), weight 112 kg, SpO2 97 %. Neurologic: Grossly normal Dressing dry  Discharge Medications:   Allergies as of 07/17/2018      Reactions   Morphine And Related Anaphylaxis   Ace Inhibitors Swelling      Medication List    STOP taking these medications   diazepam 10 MG tablet Commonly known as:  VALIUM     TAKE these medications   Accu-Chek Aviva Plus test strip Generic drug:  glucose blood   amiodarone 200 MG tablet Commonly known as:  PACERONE Take 200 mg by mouth at bedtime.   EpiPen 2-Pak 0.3 mg/0.3 mL Soaj injection Generic drug:  EPINEPHrine Inject 0.3 mg into the muscle as needed for anaphylaxis.   Fish Oil 1000 MG Caps Take 1,000 mg by mouth 2 (two) times daily.   furosemide 20 MG tablet Commonly known as:  LASIX Take 20 mg by mouth daily as needed for fluid.   hydrALAZINE 50 MG tablet Commonly known as:  APRESOLINE Take 50 mg by mouth daily.   hydrochlorothiazide 25 MG tablet Commonly known as:  HYDRODIURIL Take 25 mg by mouth every morning.   metFORMIN 500 MG tablet Commonly known as:  GLUCOPHAGE Take 500 mg by mouth 2 (two) times daily with a meal.   methocarbamol 500 MG tablet Commonly known as:  ROBAXIN Take 1 tablet (500 mg total) by mouth every 6 (six) hours as needed for muscle spasms.   metoprolol succinate 25 MG 24 hr tablet Commonly known as:  TOPROL-XL Take 25 mg by mouth every morning.   omeprazole 40 MG capsule Commonly known as:  PRILOSEC Take 40 mg by mouth every morning.   oxyCODONE 5 MG immediate release tablet Commonly known as:  Oxy IR/ROXICODONE Take 1-2  tablets (5-10 mg total) by mouth every 6 (six) hours as needed for moderate pain ((score 4 to 6)).   potassium chloride 10 MEQ tablet Commonly known as:  K-DUR Take 10 mEq by mouth at bedtime.   rivaroxaban 20 MG Tabs tablet Commonly known as:  XARELTO Take 20 mg by mouth at bedtime.   rosuvastatin 20 MG tablet Commonly known as:  CRESTOR Take 20 mg by mouth at bedtime.   tamsulosin 0.4 MG Caps capsule Commonly known as:  FLOMAX Take 1 capsule (0.4 mg total) by mouth daily.            Durable Medical Equipment  (From admission, onward)         Start  Ordered   07/16/18 1634  DME Walker rolling  Once    Question:  Patient needs a walker to treat with the following condition  Answer:  S/P lumbar fusion   07/16/18 1633   07/16/18 1634  DME 3 n 1  Once     07/16/18 1633          Disposition: home   Final Dx: lumbar fusion  Discharge Instructions     Remove dressing in 72 hours   Complete by:  As directed    Call MD for:  difficulty breathing, headache or visual disturbances   Complete by:  As directed    Call MD for:  persistant nausea and vomiting   Complete by:  As directed    Call MD for:  redness, tenderness, or signs of infection (pain, swelling, redness, odor or green/yellow discharge around incision site)   Complete by:  As directed    Call MD for:  severe uncontrolled pain   Complete by:  As directed    Call MD for:  temperature >100.4   Complete by:  As directed    Diet - low sodium heart healthy   Complete by:  As directed    Increase activity slowly   Complete by:  As directed       Follow-up Information    Eustace Moore, MD. Schedule an appointment as soon as possible for a visit in 2 week(s).   Specialty:  Neurosurgery Contact information: [address] [place] [address] [place] [address] [phone]            Signed: Eustace Moore 07/17/2018, 8:07 AM

## 2018-07-17 NOTE — Progress Notes (Signed)
Patient alert and oriented, mae's well, voiding adequate amount of urine, swallowing without difficulty, no c/o pain at time of discharge. Patient discharged home with family. Script and discharged instructions given to patient. Patient and family stated understanding of instructions given. Patient has an appointment with Dr. Jones °

## 2018-07-17 NOTE — Evaluation (Signed)
Occupational Therapy Evaluation Patient Details Name: Brandon Gibson MRN: [id number] DOB: 02-08-1951 Today's Date: 07/17/2018    History of Present Illness Pt is a 68 y/o male who presents s/p L3-L4 laminectomy with posterior lateral arthrodesis on 07/16/2018. PMH significant for measles, mumps, HTN, DM, clotting disorder, chickenpox, CA, a-fib, laminectomy 2015, B TKR, ACDF 2013.    Clinical Impression   PTA, pt was living alone and was independent. Currently, pt requires supervision for ADLs and functional mobility. Provided education on back precautions, sleep positioning, bed mobility, grooming, LB ADLs with AE, toileting, and shower transfer with 3N1; pt demonstrated understanding. Discussed recommendation for caring for his dog and having neighbor come and assist. Answered all pt questions. Recommend dc home once medically stable per physician. All acute OT needs met and will sign off. Thank you.     Follow Up Recommendations  No OT follow up;Supervision - Intermittent    Equipment Recommendations  None recommended by OT    Recommendations for Other Services PT consult     Precautions / Restrictions Precautions Precautions: Fall;Back Precaution Booklet Issued: Yes (comment) Precaution Comments: Reviewed handout in detail. Pt was able to recall 3/3 precautions, and was cued for maintenance of precautions during functional mobility.  Restrictions Weight Bearing Restrictions: No      Mobility Bed Mobility Overal bed mobility: Modified Independent             General bed mobility comments: Reviewed log roll technique  Transfers Overall transfer level: Needs assistance Equipment used: None Transfers: Sit to/from Stand Sit to Stand: Supervision         General transfer comment: Supervision for safety. No assist required.     Balance Overall balance assessment: Needs assistance Sitting-balance support: Feet supported;No upper extremity supported Sitting  balance-Leahy Scale: Fair     Standing balance support: No upper extremity supported;During functional activity Standing balance-Leahy Scale: Fair                             ADL either performed or assessed with clinical judgement   ADL Overall ADL's : Needs assistance/impaired Eating/Feeding: Independent;Sitting   Grooming: Oral care;Supervision/safety;Standing Grooming Details (indicate cue type and reason): Educated on compensatory techniques with two cups Upper Body Bathing: Supervision/ safety;Sitting   Lower Body Bathing: Supervison/ safety;Sit to/from stand   Upper Body Dressing : Supervision/safety;Sitting Upper Body Dressing Details (indicate cue type and reason): Pt donning shirt with supervision Lower Body Dressing: Supervision/safety;Sit to/from stand;With adaptive equipment Lower Body Dressing Details (indicate cue type and reason): Educating pt on donning of LB clothing with AE. Pt donning pants and underwear  Toilet Transfer: Supervision/safety;Ambulation(simulated to recliner)     Toileting - Clothing Manipulation Details (indicate cue type and reason): Educated pt on compensatory techniques for toilet hygiene     Functional mobility during ADLs: Supervision/safety General ADL Comments: Pt performing ADLs at supervision level with exception of donning socks and ankle brace. Discussed wiith pt having his friends (who plans on checking in) to assist with donning ankle brace. Also recommended pt having neighbor feed his dog as she has offered to assist.      Vision         Perception     Praxis      Pertinent Vitals/Pain Pain Assessment: Faces Faces Pain Scale: Hurts little more Pain Location: Incision site Pain Descriptors / Indicators: Operative site guarding Pain Intervention(s): Monitored during session;Limited activity within patient's tolerance;Repositioned  Hand Dominance Right   Extremity/Trunk Assessment Upper Extremity  Assessment Upper Extremity Assessment: Overall WFL for tasks assessed   Lower Extremity Assessment Lower Extremity Assessment: Defer to PT evaluation   Cervical / Trunk Assessment Cervical / Trunk Assessment: Other exceptions Cervical / Trunk Exceptions: s/p surgery   Communication Communication Communication: No difficulties   Cognition Arousal/Alertness: Awake/alert Behavior During Therapy: WFL for tasks assessed/performed Overall Cognitive Status: Within Functional Limits for tasks assessed                                     General Comments       Exercises     Shoulder Instructions      Home Living Family/patient expects to be discharged to:: Private residence Living Arrangements: Alone Available Help at Discharge: Family;Friend(s);Available PRN/intermittently Type of Home: House Home Access: Stairs to enter [employer] of Steps: 4 Entrance Stairs-Rails: Right;Left Home Layout: Two level;Bed/bath upstairs Alternate Level Stairs-Number of Steps: 14 Alternate Level Stairs-Rails: Right Bathroom Shower/Tub: Occupational psychologist: Standard     Home Equipment: Environmental consultant - 2 wheels;Bedside commode;Adaptive equipment;Crutches Adaptive Equipment: Reacher        Prior Functioning/Environment Level of Independence: Independent                 OT Problem List: Decreased activity tolerance;Impaired balance (sitting and/or standing);Decreased knowledge of use of DME or AE;Decreased knowledge of precautions;Pain;Decreased range of motion      OT Treatment/Interventions:      OT Goals(Current goals can be found in the care plan section) Acute Rehab OT Goals Patient Stated Goal: Home today OT Goal Formulation: All assessment and education complete, DC therapy  OT Frequency:     Barriers to D/C:            Co-evaluation              AM-PAC OT "6 Clicks" Daily Activity     Outcome Measure Help from another person  eating meals?: None Help from another person taking care of personal grooming?: None Help from another person toileting, which includes using toliet, bedpan, or urinal?: A Little Help from another person bathing (including washing, rinsing, drying)?: A Little Help from another person to put on and taking off regular upper body clothing?: None Help from another person to put on and taking off regular lower body clothing?: A Little 6 Click Score: 21   End of Session Nurse Communication: Mobility status  Activity Tolerance: Patient tolerated treatment well Patient left: with call bell/phone within reach;in chair  OT Visit Diagnosis: Unsteadiness on feet (R26.81);Muscle weakness (generalized) (M62.81);Pain Pain - part of body: (Back)                Time: 2979-8921 OT Time Calculation (min): 36 min Charges:  OT General Charges $OT Visit: 1 Visit OT Evaluation $OT Eval Low Complexity: 1 Low OT Treatments $Self Care/Home Management : 8-22 mins  Cintia Gleed MSOT, OTR/L Acute Rehab Pager: [phone] Office: [place] 07/17/2018, 10:47 AM

## 2018-07-17 NOTE — TOC Transition Note (Signed)
Transition of Care [facility]) - CM/SW Discharge Note   Patient Details  Name: Brandon Gibson MRN: [id number] Date of Birth: [birth year]  [facility] CM/SW Contact:  Ninfa Meeker, RN Phone Number: 07/17/2018, 12:20 PM   Clinical Narrative:   68 yr old gentleman s/p L3-L4 laminectomy. Case manager spoke with patient concerning discharge plan. Patient says he uses [facility] and wants to do so now. CM called referral to Neoma Laming, [place] Liaison.      Final next level of care: [place] Barriers to Discharge: No Barriers Identified   Patient Goals and CMS Choice Patient states their goals for this hospitalization and ongoing recovery are:: get better CMS Medicare.gov Compare Post Acute Care list provided to:: Patient(PAtient knew which agency he wanted) Choice offered to / list presented to : Patient  Discharge Placement                       Discharge Plan and Services Discharge Planning Services: CM Consult Post Acute Care Choice: Home Health          DME Arranged: N/A DME Agency: NA HH Arranged: PT [place] Agency: [facility] ([facility])   Social Determinants of Health (SDOH) Interventions     Readmission Risk Interventions No flowsheet data found.

## 2018-07-17 NOTE — Evaluation (Signed)
Physical Therapy Evaluation Patient Details Name: Brandon Gibson MRN: [id number] DOB: 08-08-1950 Today's Date: 07/17/2018   History of Present Illness  Pt is a 68 y/o male who presents s/p L3-L4 laminectomy with posterior lateral arthrodesis on 07/16/2018. PMH significant for measles, mumps, HTN, DM, clotting disorder, chickenpox, CA, a-fib, laminectomy 2015, B TKR, ACDF 2013.   Clinical Impression  Pt admitted with above diagnosis. Pt currently with functional limitations due to the deficits listed below (see PT Problem List). At the time of PT eval pt was able to perform transfers and ambulation with gross min guard assist to supervision for safety. Pt was educated on precautions, activity progression, positioning recommendations, and car transfer. We also discussed how pt can safely manage care for his elderly dog at home. Pt will benefit from skilled PT to increase their independence and safety with mobility to allow discharge to the venue listed below.       Follow Up Recommendations Home health PT;Supervision - Intermittent    Equipment Recommendations  None recommended by PT    Recommendations for Other Services       Precautions / Restrictions Precautions Precautions: Fall;Back Precaution Booklet Issued: Yes (comment) Precaution Comments: Reviewed handout in detail. Pt was able to recall 3/3 precautions, and was cued for maintenance of precautions during functional mobility.  Restrictions Weight Bearing Restrictions: No      Mobility  Bed Mobility Overal bed mobility: Modified Independent             General bed mobility comments: HOB slightly raised and rails lowered to simulate home environment. Pt has an adjustable bed at home.   Transfers Overall transfer level: Needs assistance Equipment used: None Transfers: Sit to/from Stand Sit to Stand: Supervision         General transfer comment: Supervision for safety. No assist required.    Ambulation/Gait Ambulation/Gait assistance: Min guard;Supervision Gait Distance (Feet): 250 Feet Assistive device: None Gait Pattern/deviations: Step-through pattern;Decreased stride length;Trunk flexed Gait velocity: Decreased Gait velocity interpretation: 1.31 - 2.62 ft/sec, indicative of limited community ambulator General Gait Details: VC's for improved posture. Initially pt with close guard progressing to supervision for safety by end of gait training.   Stairs Stairs: Yes Stairs assistance: Min guard Stair Management: One rail Left;Step to pattern;Forwards Number of Stairs: 10 General stair comments: VC's for sequencing and general safety. No assist required to advance.   Wheelchair Mobility    Modified Rankin (Stroke Patients Only)       Balance Overall balance assessment: Needs assistance Sitting-balance support: Feet supported;No upper extremity supported Sitting balance-Leahy Scale: Fair     Standing balance support: No upper extremity supported;During functional activity Standing balance-Leahy Scale: Fair                               Pertinent Vitals/Pain Pain Assessment: Faces Faces Pain Scale: Hurts little more Pain Location: Incision site Pain Descriptors / Indicators: Operative site guarding Pain Intervention(s): Monitored during session;Limited activity within patient's tolerance;Repositioned    Home Living Family/patient expects to be discharged to:: Private residence Living Arrangements: Alone Available Help at Discharge: Family;Friend(s);Available PRN/intermittently Type of Home: House Home Access: Stairs to enter Entrance Stairs-Rails: Psychiatric nurse of Steps: 4 Home Layout: Two level;Bed/bath upstairs Home Equipment: Walker - 2 wheels;Bedside commode;Adaptive equipment;Crutches      Prior Function Level of Independence: Independent  Hand Dominance   Dominant Hand: Right     Extremity/Trunk Assessment   Upper Extremity Assessment Upper Extremity Assessment: Defer to OT evaluation    Lower Extremity Assessment Lower Extremity Assessment: Generalized weakness(Consistent with pre-op diagnosis)    Cervical / Trunk Assessment Cervical / Trunk Assessment: Other exceptions Cervical / Trunk Exceptions: s/p surgery  Communication   Communication: No difficulties  Cognition Arousal/Alertness: Awake/alert Behavior During Therapy: WFL for tasks assessed/performed Overall Cognitive Status: Within Functional Limits for tasks assessed                                        General Comments      Exercises     Assessment/Plan    PT Assessment Patient needs continued PT services  PT Problem List Decreased strength;Decreased activity tolerance;Decreased balance;Decreased mobility;Decreased knowledge of use of DME;Decreased safety awareness;Decreased knowledge of precautions;Pain       PT Treatment Interventions DME instruction;Gait training;Stair training;Therapeutic exercise;Functional mobility training;Therapeutic activities;Neuromuscular re-education;Patient/family education    PT Goals (Current goals can be found in the Care Plan section)  Acute Rehab PT Goals Patient Stated Goal: Home today PT Goal Formulation: With patient Time For Goal Achievement: 07/24/18 Potential to Achieve Goals: Good    Frequency Min 5X/week   Barriers to discharge        Co-evaluation               AM-PAC PT "6 Clicks" Mobility  Outcome Measure Help needed turning from your back to your side while in a flat bed without using bedrails?: None Help needed moving from lying on your back to sitting on the side of a flat bed without using bedrails?: None Help needed moving to and from a bed to a chair (including a wheelchair)?: None Help needed standing up from a chair using your arms (e.g., wheelchair or bedside chair)?: None Help needed to walk in  hospital room?: None Help needed climbing 3-5 steps with a railing? : A Little 6 Click Score: 23    End of Session Equipment Utilized During Treatment: Gait belt Activity Tolerance: Patient tolerated treatment well Patient left: Other (comment)(Sitting EOB with OT present in room. ) Nurse Communication: Mobility status PT Visit Diagnosis: Unsteadiness on feet (R26.81);Pain Pain - part of body: (back)    Time: 8786-7672 PT Time Calculation (min) (ACUTE ONLY): 22 min   Charges:   PT Evaluation $PT Eval Moderate Complexity: 1 Mod          Rolinda Roan, PT, DPT Acute Rehabilitation Services Pager: [phone] Office: [phone]   Thelma Comp 07/17/2018, 10:19 AM

## 2018-08-01 ENCOUNTER — Inpatient Hospital Stay: Payer: Medicare HMO | Attending: Oncology

## 2018-08-01 ENCOUNTER — Other Ambulatory Visit: Payer: Self-pay | Admitting: *Deleted

## 2018-08-01 ENCOUNTER — Other Ambulatory Visit: Payer: Self-pay

## 2018-08-01 DIAGNOSIS — E119 Type 2 diabetes mellitus without complications: Secondary | ICD-10-CM | POA: Insufficient documentation

## 2018-08-01 DIAGNOSIS — I10 Essential (primary) hypertension: Secondary | ICD-10-CM | POA: Insufficient documentation

## 2018-08-01 DIAGNOSIS — Z85828 Personal history of other malignant neoplasm of skin: Secondary | ICD-10-CM | POA: Diagnosis not present

## 2018-08-01 DIAGNOSIS — Z86718 Personal history of other venous thrombosis and embolism: Secondary | ICD-10-CM | POA: Diagnosis not present

## 2018-08-01 DIAGNOSIS — D500 Iron deficiency anemia secondary to blood loss (chronic): Secondary | ICD-10-CM | POA: Diagnosis not present

## 2018-08-01 DIAGNOSIS — N400 Enlarged prostate without lower urinary tract symptoms: Secondary | ICD-10-CM | POA: Diagnosis not present

## 2018-08-01 DIAGNOSIS — K219 Gastro-esophageal reflux disease without esophagitis: Secondary | ICD-10-CM | POA: Insufficient documentation

## 2018-08-01 DIAGNOSIS — E7800 Pure hypercholesterolemia, unspecified: Secondary | ICD-10-CM | POA: Diagnosis not present

## 2018-08-01 DIAGNOSIS — E785 Hyperlipidemia, unspecified: Secondary | ICD-10-CM | POA: Diagnosis not present

## 2018-08-01 DIAGNOSIS — Z7901 Long term (current) use of anticoagulants: Secondary | ICD-10-CM | POA: Diagnosis not present

## 2018-08-01 DIAGNOSIS — Z79899 Other long term (current) drug therapy: Secondary | ICD-10-CM | POA: Diagnosis not present

## 2018-08-01 DIAGNOSIS — Z7984 Long term (current) use of oral hypoglycemic drugs: Secondary | ICD-10-CM | POA: Diagnosis not present

## 2018-08-01 DIAGNOSIS — I482 Chronic atrial fibrillation, unspecified: Secondary | ICD-10-CM | POA: Insufficient documentation

## 2018-08-01 DIAGNOSIS — G4730 Sleep apnea, unspecified: Secondary | ICD-10-CM | POA: Insufficient documentation

## 2018-08-01 LAB — IRON AND TIBC
Iron: 36 ug/dL — ABNORMAL LOW (ref 45–182)
Saturation Ratios: 9 % — ABNORMAL LOW (ref 17.9–39.5)
TIBC: 398 ug/dL (ref 250–450)
UIBC: 362 ug/dL

## 2018-08-01 LAB — CBC WITH DIFFERENTIAL/PLATELET
Abs Immature Granulocytes: 0.02 10*3/uL (ref 0.00–0.07)
Basophils Absolute: 0 10*3/uL (ref 0.0–0.1)
Basophils Relative: 1 %
Eosinophils Absolute: 0.2 10*3/uL (ref 0.0–0.5)
Eosinophils Relative: 4 %
HCT: 39.3 % (ref 39.0–52.0)
Hemoglobin: 12.6 g/dL — ABNORMAL LOW (ref 13.0–17.0)
Immature Granulocytes: 0 %
Lymphocytes Relative: 22 %
Lymphs Abs: 1 10*3/uL (ref 0.7–4.0)
MCH: 27.9 pg (ref 26.0–34.0)
MCHC: 32.1 g/dL (ref 30.0–36.0)
MCV: 87.1 fL (ref 80.0–100.0)
Monocytes Absolute: 0.4 10*3/uL (ref 0.1–1.0)
Monocytes Relative: 9 %
Neutro Abs: 2.9 10*3/uL (ref 1.7–7.7)
Neutrophils Relative %: 64 %
Platelets: 242 10*3/uL (ref 150–400)
RBC: 4.51 MIL/uL (ref 4.22–5.81)
RDW: 13.9 % (ref 11.5–15.5)
WBC: 4.7 10*3/uL (ref 4.0–10.5)
nRBC: 0 % (ref 0.0–0.2)

## 2018-08-01 LAB — FERRITIN: Ferritin: 28 ng/mL (ref 24–336)

## 2018-08-02 ENCOUNTER — Inpatient Hospital Stay (HOSPITAL_BASED_OUTPATIENT_CLINIC_OR_DEPARTMENT_OTHER): Payer: Medicare HMO | Admitting: Oncology

## 2018-08-02 ENCOUNTER — Encounter: Payer: Self-pay | Admitting: Oncology

## 2018-08-02 DIAGNOSIS — D500 Iron deficiency anemia secondary to blood loss (chronic): Secondary | ICD-10-CM

## 2018-08-02 MED ORDER — IRON-VITAMIN C 65-125 MG PO TABS: 1.0000 | ORAL_TABLET | Freq: Two times a day (BID) | ORAL | 0 refills | Status: DC

## 2018-08-02 NOTE — Progress Notes (Signed)
Called patient to complete Tele-Visit. Patient states no new concerns today

## 2018-08-02 NOTE — Progress Notes (Signed)
Virtual Visit via Telephone Note  I connected with Brandon Gibson on 08/02/18 at  8:30 AM EDT by telephone and verified that I am speaking with the correct person using two identifiers.   I discussed the limitations, risks, security and privacy concerns of performing an evaluation and management service by telephone and the availability of in person appointments. I also discussed with the patient that there may be a patient responsible charge related to this service. The patient expressed understanding and agreed to proceed.  Location of patient: home Location of provider: work   History of Present Illness: Brandon Gibson is a  68 y.o.  male with PMH listed below who was referred to me for evaluation of low iron.  He takes Xarelto for chronic atrial fibrillation and history of unprovoked right lower extremity DVT.  He has noticed bright red blood in the stool.  His last colonoscopy was 7 to 8 years ago.  He has already had an appointment with [facility] in June. He recently had lab work done with Dr. Humphrey Rolls which showed mild anemia with hemoglobin around 11, low iron saturation, ferritin is also decreased meeting diagnostic criteria for iron deficiency anemia. Patient reports overall doing well.  No weight loss, epigastric pain, abdominal pain, fever or chills.  s/p IV feraheme 510mg  x 2.  On 10/24/2017, CBC showed WBC 10.7, hemoglobin 14.2, MCV 81, platelet 1 56,000, neutrophil 9.4, foresight 0.5 chemistry showed creatinine 0.94, calcium 9.4, bilirubin 0.2, serum iron 79, iron saturation 20%, TIBC 393, ferritin 115.  Patient has had labs done at [facility] in the results were personally reviewed by me. 01/30/2018 WBC 4.7, hemoglobin 14.9, MCV 90, platelet count 157, normal differential. Fatigue is better.  During the interval patient has had extensive gastroenterology work-up. 11/08/2017 colonoscopy showed diverticulosis in the ascending colon and in the cecum. - One 3 mm polyp in  the cecum, removed with a jumbo cold forceps. Resected and retrieved. - One 5 mm polyp in the cecum, removed with a cold snare. Resected and retrieved. - Non-bleeding internal hemorrhoids. - The examination was otherwise normal. Same-day endoscopy showed gastric polyps and Erythematous mucosa in the antrum Biopsy negative for malignancy.  INTERVAL HISTORY Brandon Gibson is a 68 y.o. male who has above history reviewed by me today presents for follow up visit for management of iron deficiency anemia.  Patient was last seen by me on 02/02/2018. At last visit he had gross hematuria and was referred to urology for further work-up. Patient was seen by Dr. Bernardo Heater on 03/08/2018 Since last visit, patient reports doing well.Denies hematochezia, hematuria, hematemesis, epistaxis, black tarry stool or easy bruising.  Patient is on chronic anticoagulation with Xarelto for atrial fibrillation.  Recently he underwent lumbar decompression and fusion of L3-4.  He tolerated procedure and was discharged on 07/17/2018.   Denies any fatigue, shortness of breath, chest pain, abdominal pain today. Review of Systems  Constitutional: Negative for appetite change, chills, fatigue, fever and unexpected weight change.  HENT:   Negative for hearing loss and voice change.   Eyes: Negative for eye problems and icterus.  Respiratory: Negative for chest tightness, cough and shortness of breath.   Cardiovascular: Negative for chest pain and leg swelling.  Gastrointestinal: Negative for abdominal distention and abdominal pain.  Endocrine: Negative for hot flashes.  Genitourinary: Negative for difficulty urinating, dysuria and frequency.   Musculoskeletal: Negative for arthralgias.  Skin: Negative for itching and rash.  Neurological: Negative for light-headedness  and numbness.  Hematological: Negative for adenopathy. Does not bruise/bleed easily.  Psychiatric/Behavioral: Negative for confusion.    Past Medical  History:  Diagnosis Date  . Arthritis   . Atrial fibrillation ([place])   . BPH (benign prostatic hyperplasia)   . Cancer (HCC)    HX SKIN CANCER Basal cell  . Chickenpox   . Clotting disorder ([place])   . Diabetes mellitus without complication ([place])    type 2  . Dysrhythmia    IRREG HEART BEAT  . GERD (gastroesophageal reflux disease)   . H/O pleurisy   . Hypercholesteremia   . Hyperlipidemia   . Hypertension   . Iron deficiency anemia due to chronic blood loss 09/18/2017  . Lumbar stenosis   . Measles   . Mumps   . Sleep apnea    sleep study Dr. Chancy Milroy, uses CPAP   Past Surgical History:  Procedure Laterality Date  . ANTERIOR CERVICAL DECOMP/DISCECTOMY FUSION  11/25/2011   Procedure: ANTERIOR CERVICAL DECOMPRESSION/DISCECTOMY FUSION 2 LEVELS;  Surgeon: Floyce Stakes, MD;  Location: [facility];  Service: Neurosurgery;  Laterality: N/A;  Cervical four-five,Cervical five-six  Anterior cervical decompression/diskectomy, fusion, plate  . BREAST SURGERY     lumpectomy  . CARDIAC CATHETERIZATION     2011, [facility]  . CARDIOVASCULAR STRESS TEST  2011  . CERVICAL FUSION     C 6/7   . COLONOSCOPY WITH PROPOFOL N/A 11/08/2017   Procedure: COLONOSCOPY WITH PROPOFOL;  Surgeon: Toledo, Benay Pike, MD;  Location: [facility];  Service: Gastroenterology;  Laterality: N/A;  . ESOPHAGOGASTRODUODENOSCOPY (EGD) WITH PROPOFOL N/A 11/08/2017   Procedure: ESOPHAGOGASTRODUODENOSCOPY (EGD) WITH PROPOFOL;  Surgeon: Toledo, Benay Pike, MD;  Location: [facility];  Service: Gastroenterology;  Laterality: N/A;  . EYE SURGERY     LASIK  . JOINT REPLACEMENT    . KNEE ARTHROPLASTY Right 10/05/2015   Procedure: COMPUTER ASSISTED TOTAL KNEE ARTHROPLASTY;  Surgeon: Dereck Leep, MD;  Location: [facility];  Service: Orthopedics;  Laterality: Right;  . KNEE ARTHROSCOPY     Right  . KNEE ARTHROSCOPY Left 08/10/2015   Procedure: LEFT KNEE ARTHROSCOPY, CHONDROPLASTY, MEDIAL MENISECTOMY;  Surgeon: Dereck Leep, MD;   Location: [facility] ORS;  Service: Orthopedics;  Laterality: Left;  . LAMINECTOMY WITH POSTERIOR LATERAL ARTHRODESIS LEVEL 2 N/A 07/16/2018   Procedure: Posterior lumbar fusion with instrumentation at L3-4 with repeat facetectomy L3-4;  Surgeon: Eustace Moore, MD;  Location: [place];  Service: Neurosurgery;  Laterality: N/A;  Posterior lumbar fusion with instrumentation at L3-4 with repeat facetectomy L3-4  . LUMBAR LAMINECTOMY/DECOMPRESSION MICRODISCECTOMY N/A 10/04/2013   Procedure: LUMBAR TWO TO THREE LUMBAR LAMINECTOMY/DECOMPRESSION MICRODISCECTOMY 1 LEVEL;  Surgeon: Floyce Stakes, MD;  [facility];  Service: Neurosurgery;  Laterality: N/A;  L2-3 Laminectomy  . POSTERIOR LAMINECTOMY / DECOMPRESSION LUMBAR SPINE    . TRANSESOPHAGEAL ECHOCARDIOGRAM  2011    Family History  Problem Relation Age of Onset  . Leukemia Father   . Diabetes Father   . Heart disease Mother   . Prostate cancer Neg Hx   . Chronic Renal Failure Neg Hx     Social History   Socioeconomic History  . Marital status: Widowed    Spouse name: Not on file  . Number of children: Not on file  . Years of education: Not on file  . Highest education level: Not on file  Occupational History  . Not on file  Social Needs  . Financial resource strain: Not on file  .  Food insecurity:    Worry: Not on file    Inability: Not on file  . Transportation needs:    Medical: Not on file    Non-medical: Not on file  Tobacco Use  . Smoking status: Never Smoker  . Smokeless tobacco: Never Used  Substance and Sexual Activity  . Alcohol use: Yes    Alcohol/week: 14.0 standard drinks    Types: 14 Glasses of wine per week  . Drug use: No  . Sexual activity: Not on file  Lifestyle  . Physical activity:    Days per week: Not on file    Minutes per session: Not on file  . Stress: Not on file  Relationships  . Social connections:    Talks on phone: Not on file    Gets together: Not on file    Attends religious service:  Not on file    Active member of club or organization: Not on file    Attends meetings of clubs or organizations: Not on file    Relationship status: Not on file  . Intimate partner violence:    Fear of current or ex partner: Not on file    Emotionally abused: Not on file    Physically abused: Not on file    Forced sexual activity: Not on file  Other Topics Concern  . Not on file  Social History Narrative  . Not on file    Current Outpatient Medications on File Prior to Visit  Medication Sig Dispense Refill  . amiodarone (PACERONE) 200 MG tablet Take 200 mg by mouth at bedtime.    Marland Kitchen EPINEPHrine (EPIPEN 2-PAK) 0.3 mg/0.3 mL IJ SOAJ injection Inject 0.3 mg into the muscle as needed for anaphylaxis.    . furosemide (LASIX) 20 MG tablet Take 20 mg by mouth daily as needed for fluid.     . hydrALAZINE (APRESOLINE) 50 MG tablet Take 50 mg by mouth daily.     . hydrochlorothiazide (HYDRODIURIL) 25 MG tablet Take 25 mg by mouth every morning.     . metFORMIN (GLUCOPHAGE) 500 MG tablet Take 500 mg by mouth 2 (two) times daily with a meal.     . metoprolol succinate (TOPROL-XL) 25 MG 24 hr tablet Take 25 mg by mouth every morning.    . Omega-3 Fatty Acids (FISH OIL) 1000 MG CAPS Take 1,000 mg by mouth 2 (two) times daily.     Marland Kitchen omeprazole (PRILOSEC) 40 MG capsule Take 40 mg by mouth every morning.     . potassium chloride (K-DUR) 10 MEQ tablet Take 10 mEq by mouth at bedtime.     . rivaroxaban (XARELTO) 20 MG TABS tablet Take 20 mg by mouth at bedtime.    . rosuvastatin (CRESTOR) 20 MG tablet Take 20 mg by mouth at bedtime.     . tamsulosin (FLOMAX) 0.4 MG CAPS capsule Take 1 capsule (0.4 mg total) by mouth daily. 90 capsule 3  . ACCU-CHEK AVIVA PLUS test strip      No current facility-administered medications on file prior to visit.     Allergies  Allergen Reactions  . Morphine And Related Anaphylaxis  . Ace Inhibitors Swelling       Observations/Objective: There were no vitals filed  for this visit. There is no height or weight on file to calculate BMI.   CBC    Component Value Date/Time   WBC 4.7 08/01/2018 0951   RBC 4.51 08/01/2018 0951   HGB 12.6 (L) 08/01/2018 6720  HGB 14.8 10/23/2013 0655   HCT 39.3 08/01/2018 0951   HCT 43.5 10/23/2013 0655   PLT 242 08/01/2018 0951   PLT 203 10/23/2013 0655   MCV 87.1 08/01/2018 0951   MCV 88 10/23/2013 0655   MCH 27.9 08/01/2018 0951   MCHC 32.1 08/01/2018 0951   RDW 13.9 08/01/2018 0951   RDW 13.6 10/23/2013 0655   LYMPHSABS 1.0 08/01/2018 0951   MONOABS 0.4 08/01/2018 0951   EOSABS 0.2 08/01/2018 0951   BASOSABS 0.0 08/01/2018 0951    CMP     Component Value Date/Time   NA 139 07/11/2018 1132   NA 142 10/23/2013 0655   K 3.7 07/11/2018 1132   K 3.7 10/23/2013 0655   CL 108 07/11/2018 1132   CL 109 (H) 10/23/2013 0655   CO2 23 07/11/2018 1132   CO2 26 10/23/2013 0655   GLUCOSE 144 (H) 07/11/2018 1132   GLUCOSE 124 (H) 10/23/2013 0655   BUN 13 07/11/2018 1132   BUN 15 10/23/2013 0655   CREATININE 1.21 07/11/2018 1132   CREATININE 1.11 10/23/2013 0655   CALCIUM 9.4 07/11/2018 1132   CALCIUM 9.1 10/23/2013 0655   GFRNONAA >60 07/11/2018 1132   GFRNONAA >60 10/23/2013 0655   GFRAA >60 07/11/2018 1132   GFRAA >60 10/23/2013 0655     Assessment and Plan: 1. Iron deficiency anemia due to chronic blood loss   Labs are reviewed and discussed with patient.  Hemoglobin slightly decreased compared to 3 weeks ago.  Possible due to the blood loss from recent procedure. Iron panel is consistent with mild iron deficiency anemia. I recommend patient to take Vitron C 1 tab twice daily for the time being. Hold IV Feraheme as we are in the COVID-19 outbreak and want to decrease his risk of exposure. Recommend patient to repeat CBC, iron, ferritin, TIBC in 8 to 10 weeks and see me in the clinic for reassessment.  Follow Up Instructions: 8-10 weeks   I discussed the assessment and treatment plan with the  patient. The patient was provided an opportunity to ask questions and all were answered. The patient agreed with the plan and demonstrated an understanding of the instructions.   The patient was advised to call back or seek an in-person evaluation if the symptoms worsen or if the condition fails to improve as anticipated.  I provided 12 minutes of non-face-to-face time during this encounter.   Earlie Server, MD

## 2018-08-03 ENCOUNTER — Inpatient Hospital Stay: Payer: Medicare HMO | Admitting: Oncology

## 2018-08-03 ENCOUNTER — Inpatient Hospital Stay: Payer: Medicare HMO

## 2018-10-10 ENCOUNTER — Other Ambulatory Visit: Payer: Self-pay

## 2018-10-11 ENCOUNTER — Inpatient Hospital Stay: Payer: Medicare HMO | Admitting: Oncology

## 2018-10-11 ENCOUNTER — Other Ambulatory Visit: Payer: Self-pay

## 2018-10-11 ENCOUNTER — Encounter: Payer: Self-pay | Admitting: Oncology

## 2018-10-11 ENCOUNTER — Inpatient Hospital Stay: Payer: Medicare HMO | Attending: Oncology

## 2018-10-11 VITALS — BP 145/74 | HR 59 | Temp 97.2°F | Resp 18 | Wt 247.9 lb

## 2018-10-11 DIAGNOSIS — M1990 Unspecified osteoarthritis, unspecified site: Secondary | ICD-10-CM | POA: Diagnosis not present

## 2018-10-11 DIAGNOSIS — Z7901 Long term (current) use of anticoagulants: Secondary | ICD-10-CM | POA: Diagnosis not present

## 2018-10-11 DIAGNOSIS — Z79899 Other long term (current) drug therapy: Secondary | ICD-10-CM | POA: Diagnosis not present

## 2018-10-11 DIAGNOSIS — E785 Hyperlipidemia, unspecified: Secondary | ICD-10-CM | POA: Diagnosis not present

## 2018-10-11 DIAGNOSIS — R319 Hematuria, unspecified: Secondary | ICD-10-CM | POA: Insufficient documentation

## 2018-10-11 DIAGNOSIS — D500 Iron deficiency anemia secondary to blood loss (chronic): Secondary | ICD-10-CM | POA: Diagnosis present

## 2018-10-11 DIAGNOSIS — E119 Type 2 diabetes mellitus without complications: Secondary | ICD-10-CM | POA: Insufficient documentation

## 2018-10-11 DIAGNOSIS — E7800 Pure hypercholesterolemia, unspecified: Secondary | ICD-10-CM | POA: Diagnosis not present

## 2018-10-11 DIAGNOSIS — Z85828 Personal history of other malignant neoplasm of skin: Secondary | ICD-10-CM | POA: Diagnosis not present

## 2018-10-11 DIAGNOSIS — K921 Melena: Secondary | ICD-10-CM | POA: Insufficient documentation

## 2018-10-11 DIAGNOSIS — Z7984 Long term (current) use of oral hypoglycemic drugs: Secondary | ICD-10-CM | POA: Diagnosis not present

## 2018-10-11 DIAGNOSIS — I10 Essential (primary) hypertension: Secondary | ICD-10-CM | POA: Insufficient documentation

## 2018-10-11 DIAGNOSIS — N400 Enlarged prostate without lower urinary tract symptoms: Secondary | ICD-10-CM | POA: Insufficient documentation

## 2018-10-11 DIAGNOSIS — Z86718 Personal history of other venous thrombosis and embolism: Secondary | ICD-10-CM | POA: Insufficient documentation

## 2018-10-11 DIAGNOSIS — I482 Chronic atrial fibrillation, unspecified: Secondary | ICD-10-CM | POA: Diagnosis not present

## 2018-10-11 LAB — IRON AND TIBC
Iron: 117 ug/dL (ref 45–182)
Saturation Ratios: 25 % (ref 17.9–39.5)
TIBC: 465 ug/dL — ABNORMAL HIGH (ref 250–450)
UIBC: 348 ug/dL

## 2018-10-11 LAB — CBC WITH DIFFERENTIAL/PLATELET
Abs Immature Granulocytes: 0.02 10*3/uL (ref 0.00–0.07)
Basophils Absolute: 0 10*3/uL (ref 0.0–0.1)
Basophils Relative: 1 %
Eosinophils Absolute: 0.1 10*3/uL (ref 0.0–0.5)
Eosinophils Relative: 3 %
HCT: 42.8 % (ref 39.0–52.0)
Hemoglobin: 13.6 g/dL (ref 13.0–17.0)
Immature Granulocytes: 1 %
Lymphocytes Relative: 26 %
Lymphs Abs: 1.1 10*3/uL (ref 0.7–4.0)
MCH: 27.1 pg (ref 26.0–34.0)
MCHC: 31.8 g/dL (ref 30.0–36.0)
MCV: 85.3 fL (ref 80.0–100.0)
Monocytes Absolute: 0.5 10*3/uL (ref 0.1–1.0)
Monocytes Relative: 11 %
Neutro Abs: 2.5 10*3/uL (ref 1.7–7.7)
Neutrophils Relative %: 58 %
Platelets: 162 10*3/uL (ref 150–400)
RBC: 5.02 MIL/uL (ref 4.22–5.81)
RDW: 15.6 % — ABNORMAL HIGH (ref 11.5–15.5)
WBC: 4.2 10*3/uL (ref 4.0–10.5)
nRBC: 0 % (ref 0.0–0.2)

## 2018-10-11 LAB — FERRITIN: Ferritin: 14 ng/mL — ABNORMAL LOW (ref 24–336)

## 2018-10-11 NOTE — Progress Notes (Signed)
Patient here for follow up. Pt had spine surgery in March and is doing well.

## 2018-10-11 NOTE — Progress Notes (Signed)
Hematology/Oncology follow up note [facility] [phone] [phone]   Patient Care Team: Perrin Maltese, MD as PCP - General (Internal Medicine)  REFERRING PROVIDER: Perrin Maltese, MD  REASON FOR VISIT Follow up for treatment of iron deficiency.   HISTORY OF PRESENTING ILLNESS:  Brandon Gibson is a  68 y.o.  male with PMH listed below who was referred to me for evaluation of low iron.  He takes Xarelto for chronic atrial fibrillation and history of unprovoked right lower extremity DVT.  He has noticed bright red blood in the stool.  His last colonoscopy was 7 to 8 years ago.  He has already had an appointment with [facility] clinic in June. He recently had lab work done with Dr. Humphrey Rolls which showed mild anemia with hemoglobin around 11, low iron saturation, ferritin is also decreased meeting diagnostic criteria for iron deficiency anemia. Patient reports overall doing well.  No weight loss, epigastric pain, abdominal pain, fever or chills.  s/p IV feraheme 510mg  x 2.  On 10/24/2017, CBC showed WBC 10.7, hemoglobin 14.2, MCV 81, platelet 1 56,000, neutrophil 9.4, foresight 0.5 chemistry showed creatinine 0.94, calcium 9.4, bilirubin 0.2, serum iron 79, iron saturation 20%, TIBC 393, ferritin 115.   # Patient has had labs done at [facility] in the results were personally reviewed by me. 01/30/2018 WBC 4.7, hemoglobin 14.9, MCV 90, platelet count 157, normal differential. Fatigue is better.  During the interval patient has had extensive gastroenterology work-up. 11/08/2017 colonoscopy showed diverticulosis in the ascending colon and in the cecum. - One 3 mm polyp in the cecum, removed with a jumbo cold forceps. Resected and retrieved. - One 5 mm polyp in the cecum, removed with a cold snare. Resected and retrieved. - Non-bleeding internal hemorrhoids. - The examination was otherwise normal. Same-day endoscopy showed gastric polyps and  Erythematous mucosa in the antrum Biopsy negative for malignancy.   INTERVAL HISTORY Brandon Gibson is a 68 y.o. male who has above history reviewed by me today presents for follow up visit for for management of iron deficiency.  Patient was advised to take oral iron supplementation at the last visit.  He reports tolerating iron treatments well. Continue to have bright red blood in the stool intermittently, describes as 4-5 times per week  He attributes to hemorrhoids. Otherwise no new complaints.  Hematuria resolved.  Review of Systems  Constitutional: Negative for chills, fever, malaise/fatigue and weight loss.  HENT: Negative for sore throat.   Eyes: Negative for redness.  Respiratory: Negative for cough, shortness of breath and wheezing.   Cardiovascular: Negative for chest pain, palpitations and leg swelling.  Gastrointestinal: Negative for abdominal pain, blood in stool, nausea and vomiting.  Genitourinary: Negative for dysuria.  Musculoskeletal: Negative for myalgias.  Skin: Negative for rash.  Neurological: Negative for dizziness, tingling and tremors.  Endo/Heme/Allergies: Does not bruise/bleed easily.  Psychiatric/Behavioral: Negative for hallucinations.    MEDICAL HISTORY:  Past Medical History:  Diagnosis Date  . Arthritis   . Atrial fibrillation ([place])   . BPH (benign prostatic hyperplasia)   . Cancer (HCC)    HX SKIN CANCER Basal cell  . Chickenpox   . Clotting disorder ([place])   . Diabetes mellitus without complication ([place])    type 2  . Dysrhythmia    IRREG HEART BEAT  . GERD (gastroesophageal reflux disease)   . H/O pleurisy   . Hypercholesteremia   . Hyperlipidemia   . Hypertension   .  Iron deficiency anemia due to chronic blood loss 09/18/2017  . Lumbar stenosis   . Measles   . Mumps   . Sleep apnea    sleep study Dr. Chancy Milroy, uses CPAP    SURGICAL HISTORY: Past Surgical History:  Procedure Laterality Date  . ANTERIOR CERVICAL DECOMP/DISCECTOMY  FUSION  11/25/2011   Procedure: ANTERIOR CERVICAL DECOMPRESSION/DISCECTOMY FUSION 2 LEVELS;  Surgeon: Floyce Stakes, MD;  Location: [facility];  Service: Neurosurgery;  Laterality: N/A;  Cervical four-five,Cervical five-six  Anterior cervical decompression/diskectomy, fusion, plate  . BREAST SURGERY     lumpectomy  . CARDIAC CATHETERIZATION     2011, [facility]  . CARDIOVASCULAR STRESS TEST  2011  . CERVICAL FUSION     C 6/7   . COLONOSCOPY WITH PROPOFOL N/A 11/08/2017   Procedure: COLONOSCOPY WITH PROPOFOL;  Surgeon: Toledo, Benay Pike, MD;  Location: [facility];  Service: Gastroenterology;  Laterality: N/A;  . ESOPHAGOGASTRODUODENOSCOPY (EGD) WITH PROPOFOL N/A 11/08/2017   Procedure: ESOPHAGOGASTRODUODENOSCOPY (EGD) WITH PROPOFOL;  Surgeon: Toledo, Benay Pike, MD;  Location: [facility];  Service: Gastroenterology;  Laterality: N/A;  . EYE SURGERY     LASIK  . JOINT REPLACEMENT    . KNEE ARTHROPLASTY Right 10/05/2015   Procedure: COMPUTER ASSISTED TOTAL KNEE ARTHROPLASTY;  Surgeon: Dereck Leep, MD;  Location: [facility];  Service: Orthopedics;  Laterality: Right;  . KNEE ARTHROSCOPY     Right  . KNEE ARTHROSCOPY Left 08/10/2015   Procedure: LEFT KNEE ARTHROSCOPY, CHONDROPLASTY, MEDIAL MENISECTOMY;  Surgeon: Dereck Leep, MD;  Location: [facility];  Service: Orthopedics;  Laterality: Left;  . LAMINECTOMY WITH POSTERIOR LATERAL ARTHRODESIS LEVEL 2 N/A 07/16/2018   Procedure: Posterior lumbar fusion with instrumentation at L3-4 with repeat facetectomy L3-4;  Surgeon: Eustace Moore, MD;  Location: [place];  Service: Neurosurgery;  Laterality: N/A;  Posterior lumbar fusion with instrumentation at L3-4 with repeat facetectomy L3-4  . LUMBAR LAMINECTOMY/DECOMPRESSION MICRODISCECTOMY N/A 10/04/2013   Procedure: LUMBAR TWO TO THREE LUMBAR LAMINECTOMY/DECOMPRESSION MICRODISCECTOMY 1 LEVEL;  Surgeon: Floyce Stakes, MD;  Location: [facility];  Service: Neurosurgery;  Laterality: N/A;  L2-3 Laminectomy   . POSTERIOR LAMINECTOMY / DECOMPRESSION LUMBAR SPINE    . TRANSESOPHAGEAL ECHOCARDIOGRAM  2011    SOCIAL HISTORY: Social History   Socioeconomic History  . Marital status: Widowed    Spouse name: Not on file  . Number of children: Not on file  . Years of education: Not on file  . Highest education level: Not on file  Occupational History  . Not on file  Social Needs  . Financial resource strain: Not on file  . Food insecurity    Worry: Not on file    Inability: Not on file  . Transportation needs    Medical: Not on file    Non-medical: Not on file  Tobacco Use  . Smoking status: Never Smoker  . Smokeless tobacco: Never Used  Substance and Sexual Activity  . Alcohol use: Yes    Alcohol/week: 14.0 standard drinks    Types: 14 Glasses of wine per week  . Drug use: No  . Sexual activity: Not on file  Lifestyle  . Physical activity    Days per week: Not on file    Minutes per session: Not on file  . Stress: Not on file  Relationships  . Social Herbalist on phone: Not on file    Gets together: Not on file    Attends religious service: Not  on file    Active member of club or organization: Not on file    Attends meetings of clubs or organizations: Not on file    Relationship status: Not on file  . Intimate partner violence    Fear of current or ex partner: Not on file    Emotionally abused: Not on file    Physically abused: Not on file    Forced sexual activity: Not on file  Other Topics Concern  . Not on file  Social History Narrative  . Not on file    FAMILY HISTORY: Family History  Problem Relation Age of Onset  . Leukemia Father   . Diabetes Father   . Heart disease Mother   . Prostate cancer Neg Hx   . Chronic Renal Failure Neg Hx     ALLERGIES:  is allergic to morphine and related and ace inhibitors.  MEDICATIONS:  Current Outpatient Medications  Medication Sig Dispense Refill  . ACCU-CHEK AVIVA PLUS test strip     . amiodarone  (PACERONE) 200 MG tablet Take 200 mg by mouth at bedtime.    Marland Kitchen EPINEPHrine (EPIPEN 2-PAK) 0.3 mg/0.3 mL IJ SOAJ injection Inject 0.3 mg into the muscle as needed for anaphylaxis.    . furosemide (LASIX) 20 MG tablet Take 20 mg by mouth daily as needed for fluid.     . hydrALAZINE (APRESOLINE) 50 MG tablet Take 50 mg by mouth daily.     . hydrochlorothiazide (HYDRODIURIL) 25 MG tablet Take 25 mg by mouth every morning.     . Iron-Vitamin C 65-125 MG TABS Take 1 tablet by mouth 2 (two) times daily. 180 tablet 0  . metFORMIN (GLUCOPHAGE) 500 MG tablet Take 500 mg by mouth 2 (two) times daily with a meal.     . metoprolol succinate (TOPROL-XL) 25 MG 24 hr tablet Take 25 mg by mouth every morning.    . Omega-3 Fatty Acids (FISH OIL) 1000 MG CAPS Take 1,000 mg by mouth 2 (two) times daily.     Marland Kitchen omeprazole (PRILOSEC) 40 MG capsule Take 40 mg by mouth every morning.     . potassium chloride (K-DUR) 10 MEQ tablet Take 10 mEq by mouth at bedtime.     . rivaroxaban (XARELTO) 20 MG TABS tablet Take 20 mg by mouth at bedtime.    . rosuvastatin (CRESTOR) 20 MG tablet Take 20 mg by mouth at bedtime.     . tamsulosin (FLOMAX) 0.4 MG CAPS capsule Take 1 capsule (0.4 mg total) by mouth daily. 90 capsule 3   No current facility-administered medications for this visit.      PHYSICAL EXAMINATION: ECOG PERFORMANCE STATUS: 0 - Asymptomatic Vitals:   10/11/18 0941  BP: (!) 145/74  Pulse: (!) 59  Resp: 18  Temp: (!) 97.2 F (36.2 C)   Filed Weights   10/11/18 0941  Weight: 247 lb 14.4 oz (112.4 kg)    Physical Exam Constitutional:      General: He is not in acute distress.    Appearance: He is well-developed. He is obese.  HENT:     Head: Normocephalic and atraumatic.     Right Ear: External ear normal.     Left Ear: External ear normal.  Eyes:     General: No scleral icterus.    Conjunctiva/sclera: Conjunctivae normal.     Pupils: Pupils are equal, round, and reactive to light.  Neck:      Musculoskeletal: Normal range of motion and neck supple.  Cardiovascular:  Rate and Rhythm: Normal rate and regular rhythm.     Heart sounds: Normal heart sounds.  Pulmonary:     Effort: Pulmonary effort is normal. No respiratory distress.     Breath sounds: Normal breath sounds. No wheezing or rales.  Chest:     Chest wall: No tenderness.  Abdominal:     General: Bowel sounds are normal. There is no distension.     Palpations: Abdomen is soft. There is no mass.     Tenderness: There is no abdominal tenderness.  Musculoskeletal: Normal range of motion.        General: No deformity.  Lymphadenopathy:     Cervical: No cervical adenopathy.  Skin:    General: Skin is warm and dry.     Findings: No erythema or rash.  Neurological:     Mental Status: He is alert and oriented to person, place, and time.     Cranial Nerves: No cranial nerve deficit.     Coordination: Coordination normal.  Psychiatric:        Behavior: Behavior normal.        Thought Content: Thought content normal.      LABORATORY DATA:  I have reviewed the data as listed Lab Results  Component Value Date   WBC 4.2 10/11/2018   HGB 13.6 10/11/2018   HCT 42.8 10/11/2018   MCV 85.3 10/11/2018   PLT 162 10/11/2018   Recent Labs    07/11/18 1132  NA 139  K 3.7  CL 108  CO2 23  GLUCOSE 144*  BUN 13  CREATININE 1.21  CALCIUM 9.4  GFRNONAA >60  GFRAA >60       ASSESSMENT & PLAN:  1. Iron deficiency anemia due to chronic blood loss   2. Blood in stool   3. Hematuria, unspecified type   Labs are reviewed and discussed with patient. Hemoglobin has improved and normalized. Iron panel shows iron saturation 25, ferritin 14, TIBC 465. Hold additional IV iron at this point. Asked patient to continue oral iron supplementation.  Repeat blood work in 4 months.   #Bright blood in the stool, patient has had a EGD and colonoscopy.  Recommend patient to discuss with primary care physician for surgical  opinion for hemorrhoids. # Hematuria, advise patient to follow-up with urology.  All questions were answered. The patient knows to call the clinic with any problems questions or concerns.  Return of visit: 4 months  Earlie Server, MD, PhD  10/11/2018

## 2019-02-08 ENCOUNTER — Other Ambulatory Visit: Payer: Self-pay

## 2019-02-08 ENCOUNTER — Inpatient Hospital Stay: Payer: Medicare HMO | Attending: Oncology

## 2019-02-08 ENCOUNTER — Encounter: Payer: Self-pay | Admitting: Oncology

## 2019-02-08 DIAGNOSIS — K921 Melena: Secondary | ICD-10-CM | POA: Diagnosis not present

## 2019-02-08 DIAGNOSIS — I482 Chronic atrial fibrillation, unspecified: Secondary | ICD-10-CM | POA: Diagnosis not present

## 2019-02-08 DIAGNOSIS — D500 Iron deficiency anemia secondary to blood loss (chronic): Secondary | ICD-10-CM | POA: Insufficient documentation

## 2019-02-08 DIAGNOSIS — Z8249 Family history of ischemic heart disease and other diseases of the circulatory system: Secondary | ICD-10-CM | POA: Insufficient documentation

## 2019-02-08 DIAGNOSIS — Z85828 Personal history of other malignant neoplasm of skin: Secondary | ICD-10-CM | POA: Insufficient documentation

## 2019-02-08 DIAGNOSIS — R5383 Other fatigue: Secondary | ICD-10-CM | POA: Insufficient documentation

## 2019-02-08 DIAGNOSIS — Z7289 Other problems related to lifestyle: Secondary | ICD-10-CM | POA: Diagnosis not present

## 2019-02-08 DIAGNOSIS — D120 Benign neoplasm of cecum: Secondary | ICD-10-CM | POA: Diagnosis not present

## 2019-02-08 DIAGNOSIS — Z86718 Personal history of other venous thrombosis and embolism: Secondary | ICD-10-CM | POA: Diagnosis not present

## 2019-02-08 DIAGNOSIS — Z79899 Other long term (current) drug therapy: Secondary | ICD-10-CM | POA: Diagnosis not present

## 2019-02-08 DIAGNOSIS — K317 Polyp of stomach and duodenum: Secondary | ICD-10-CM | POA: Diagnosis not present

## 2019-02-08 DIAGNOSIS — K5730 Diverticulosis of large intestine without perforation or abscess without bleeding: Secondary | ICD-10-CM | POA: Diagnosis not present

## 2019-02-08 DIAGNOSIS — Z7984 Long term (current) use of oral hypoglycemic drugs: Secondary | ICD-10-CM | POA: Insufficient documentation

## 2019-02-08 DIAGNOSIS — Z806 Family history of leukemia: Secondary | ICD-10-CM | POA: Diagnosis not present

## 2019-02-08 DIAGNOSIS — K648 Other hemorrhoids: Secondary | ICD-10-CM | POA: Diagnosis not present

## 2019-02-08 DIAGNOSIS — Z885 Allergy status to narcotic agent status: Secondary | ICD-10-CM | POA: Insufficient documentation

## 2019-02-08 DIAGNOSIS — Z7901 Long term (current) use of anticoagulants: Secondary | ICD-10-CM | POA: Diagnosis not present

## 2019-02-08 DIAGNOSIS — Z833 Family history of diabetes mellitus: Secondary | ICD-10-CM | POA: Insufficient documentation

## 2019-02-08 LAB — FERRITIN: Ferritin: 23 ng/mL — ABNORMAL LOW (ref 24–336)

## 2019-02-08 LAB — CBC WITH DIFFERENTIAL/PLATELET
Abs Immature Granulocytes: 0.02 10*3/uL (ref 0.00–0.07)
Basophils Absolute: 0 10*3/uL (ref 0.0–0.1)
Basophils Relative: 1 %
Eosinophils Absolute: 0.1 10*3/uL (ref 0.0–0.5)
Eosinophils Relative: 2 %
HCT: 43.6 % (ref 39.0–52.0)
Hemoglobin: 14.9 g/dL (ref 13.0–17.0)
Immature Granulocytes: 1 %
Lymphocytes Relative: 26 %
Lymphs Abs: 1.1 10*3/uL (ref 0.7–4.0)
MCH: 31.2 pg (ref 26.0–34.0)
MCHC: 34.2 g/dL (ref 30.0–36.0)
MCV: 91.4 fL (ref 80.0–100.0)
Monocytes Absolute: 0.5 10*3/uL (ref 0.1–1.0)
Monocytes Relative: 12 %
Neutro Abs: 2.5 10*3/uL (ref 1.7–7.7)
Neutrophils Relative %: 58 %
Platelets: 145 10*3/uL — ABNORMAL LOW (ref 150–400)
RBC: 4.77 MIL/uL (ref 4.22–5.81)
RDW: 15.2 % (ref 11.5–15.5)
WBC: 4.1 10*3/uL (ref 4.0–10.5)
nRBC: 0 % (ref 0.0–0.2)

## 2019-02-08 LAB — IRON AND TIBC
Iron: 102 ug/dL (ref 45–182)
Saturation Ratios: 25 % (ref 17.9–39.5)
TIBC: 413 ug/dL (ref 250–450)
UIBC: 311 ug/dL

## 2019-02-08 NOTE — Progress Notes (Signed)
Patient contacted for pre-sreening prior to appointment. No concerns voiced.

## 2019-02-11 ENCOUNTER — Other Ambulatory Visit: Payer: Self-pay

## 2019-02-11 ENCOUNTER — Inpatient Hospital Stay: Payer: Medicare HMO | Admitting: Oncology

## 2019-02-11 ENCOUNTER — Inpatient Hospital Stay: Payer: Medicare HMO

## 2019-02-11 VITALS — BP 141/78 | HR 57 | Temp 97.4°F | Resp 16 | Wt 251.4 lb

## 2019-02-11 DIAGNOSIS — K921 Melena: Secondary | ICD-10-CM | POA: Diagnosis not present

## 2019-02-11 DIAGNOSIS — D500 Iron deficiency anemia secondary to blood loss (chronic): Secondary | ICD-10-CM

## 2019-02-11 MED ORDER — IRON-VITAMIN C 65-125 MG PO TABS: 1.0000 | ORAL_TABLET | Freq: Two times a day (BID) | ORAL | 0 refills | Status: DC

## 2019-02-11 MED ORDER — IRON-VITAMIN C 65-125 MG PO TABS: 1.0000 | ORAL_TABLET | Freq: Two times a day (BID) | ORAL | 1 refills | Status: AC

## 2019-02-11 NOTE — Progress Notes (Signed)
Hematology/Oncology follow up note [facility] [phone] [phone]   Patient Care Team: Perrin Maltese, MD as PCP - General (Internal Medicine)  REFERRING PROVIDER: Perrin Maltese, MD  REASON FOR VISIT Follow up for treatment of iron deficiency.   HISTORY OF PRESENTING ILLNESS:  Brandon Gibson is a  68 y.o.  male with PMH listed below who was referred to me for evaluation of low iron.  He takes Xarelto for chronic atrial fibrillation and history of unprovoked right lower extremity DVT.  He has noticed bright red blood in the stool.  His last colonoscopy was 7 to 8 years ago.  He has already had an appointment with [facility] in June. He recently had lab work done with Dr. Humphrey Rolls which showed mild anemia with hemoglobin around 11, low iron saturation, ferritin is also decreased meeting diagnostic criteria for iron deficiency anemia. Patient reports overall doing well.  No weight loss, epigastric pain, abdominal pain, fever or chills.  s/p IV feraheme 510mg  x 2.  On 10/24/2017, CBC showed WBC 10.7, hemoglobin 14.2, MCV 81, platelet 1 56,000, neutrophil 9.4, foresight 0.5 chemistry showed creatinine 0.94, calcium 9.4, bilirubin 0.2, serum iron 79, iron saturation 20%, TIBC 393, ferritin 115.   # Patient has had labs done at [facility] in the results were personally reviewed by me. 01/30/2018 WBC 4.7, hemoglobin 14.9, MCV 90, platelet count 157, normal differential. Fatigue is better.  During the interval patient has had extensive gastroenterology work-up. 11/08/2017 colonoscopy showed diverticulosis in the ascending colon and in the cecum. - One 3 mm polyp in the cecum, removed with a jumbo cold forceps. Resected and retrieved. - One 5 mm polyp in the cecum, removed with a cold snare. Resected and retrieved. - Non-bleeding internal hemorrhoids. - The examination was otherwise normal. Same-day endoscopy showed gastric polyps and  Erythematous mucosa in the antrum Biopsy negative for malignancy.   INTERVAL HISTORY Lataurus Bratland is a 68 y.o. male who has above history reviewed by me today presents for follow up visit for for management of iron deficiency.  Patient continues to take oral Vitron C twice daily.  Intermittent bright red blood in the stool, a few times per week. He attributes to hemorrhoids.  No new complaints.  Denies weight loss, fever, chills, fatigue, night sweats.   Review of Systems  Constitutional: Negative for chills, fever, malaise/fatigue and weight loss.  HENT: Negative for sore throat.   Eyes: Negative for redness.  Respiratory: Negative for cough, shortness of breath and wheezing.   Cardiovascular: Negative for chest pain, palpitations and leg swelling.  Gastrointestinal: Negative for abdominal pain, blood in stool, nausea and vomiting.  Genitourinary: Negative for dysuria.  Musculoskeletal: Negative for myalgias.  Skin: Negative for rash.  Neurological: Negative for dizziness, tingling and tremors.  Endo/Heme/Allergies: Does not bruise/bleed easily.  Psychiatric/Behavioral: Negative for hallucinations.    MEDICAL HISTORY:  Past Medical History:  Diagnosis Date  . Arthritis   . Atrial fibrillation ([place])   . BPH (benign prostatic hyperplasia)   . Cancer (HCC)    HX SKIN CANCER Basal cell  . Chickenpox   . Clotting disorder ([place])   . Diabetes mellitus without complication ([place])    type 2  . Dysrhythmia    IRREG HEART BEAT  . GERD (gastroesophageal reflux disease)   . H/O pleurisy   . Hypercholesteremia   . Hyperlipidemia   . Hypertension   . Iron deficiency anemia due to chronic blood  loss 09/18/2017  . Lumbar stenosis   . Measles   . Mumps   . Sleep apnea    sleep study Dr. Chancy Milroy, uses CPAP    SURGICAL HISTORY: Past Surgical History:  Procedure Laterality Date  . ANTERIOR CERVICAL DECOMP/DISCECTOMY FUSION  11/25/2011   Procedure: ANTERIOR CERVICAL  DECOMPRESSION/DISCECTOMY FUSION 2 LEVELS;  Surgeon: Floyce Stakes, MD;  [facility];  Service: Neurosurgery;  Laterality: N/A;  Cervical four-five,Cervical five-six  Anterior cervical decompression/diskectomy, fusion, plate  . BREAST SURGERY     lumpectomy  . CARDIAC CATHETERIZATION     2011, [facility]  . CARDIOVASCULAR STRESS TEST  2011  . CERVICAL FUSION     C 6/7   . COLONOSCOPY WITH PROPOFOL N/A 11/08/2017   Procedure: COLONOSCOPY WITH PROPOFOL;  Surgeon: Toledo, Benay Pike, MD;  Location: [facility];  Service: Gastroenterology;  Laterality: N/A;  . ESOPHAGOGASTRODUODENOSCOPY (EGD) WITH PROPOFOL N/A 11/08/2017   Procedure: ESOPHAGOGASTRODUODENOSCOPY (EGD) WITH PROPOFOL;  Surgeon: Toledo, Benay Pike, MD;  Location: [facility];  Service: Gastroenterology;  Laterality: N/A;  . EYE SURGERY     LASIK  . JOINT REPLACEMENT    . KNEE ARTHROPLASTY Right 10/05/2015   Procedure: COMPUTER ASSISTED TOTAL KNEE ARTHROPLASTY;  Surgeon: Dereck Leep, MD;  Location: [facility] ORS;  Service: Orthopedics;  Laterality: Right;  . KNEE ARTHROSCOPY     Right  . KNEE ARTHROSCOPY Left 08/10/2015   Procedure: LEFT KNEE ARTHROSCOPY, CHONDROPLASTY, MEDIAL MENISECTOMY;  Surgeon: Dereck Leep, MD;  Location: [facility] ORS;  Service: Orthopedics;  Laterality: Left;  . LAMINECTOMY WITH POSTERIOR LATERAL ARTHRODESIS LEVEL 2 N/A 07/16/2018   Procedure: Posterior lumbar fusion with instrumentation at L3-4 with repeat facetectomy L3-4;  Surgeon: Eustace Moore, MD;  Location: [place];  Service: Neurosurgery;  Laterality: N/A;  Posterior lumbar fusion with instrumentation at L3-4 with repeat facetectomy L3-4  . LUMBAR LAMINECTOMY/DECOMPRESSION MICRODISCECTOMY N/A 10/04/2013   Procedure: LUMBAR TWO TO THREE LUMBAR LAMINECTOMY/DECOMPRESSION MICRODISCECTOMY 1 LEVEL;  Surgeon: Floyce Stakes, MD;  [facility];  Service: Neurosurgery;  Laterality: N/A;  L2-3 Laminectomy  . POSTERIOR LAMINECTOMY / DECOMPRESSION LUMBAR  SPINE    . TRANSESOPHAGEAL ECHOCARDIOGRAM  2011    SOCIAL HISTORY: Social History   Socioeconomic History  . Marital status: Widowed    Spouse name: Not on file  . Number of children: Not on file  . Years of education: Not on file  . Highest education level: Not on file  Occupational History  . Not on file  Social Needs  . Financial resource strain: Not on file  . Food insecurity    Worry: Not on file    Inability: Not on file  . Transportation needs    Medical: Not on file    Non-medical: Not on file  Tobacco Use  . Smoking status: Never Smoker  . Smokeless tobacco: Never Used  Substance and Sexual Activity  . Alcohol use: Yes    Alcohol/week: 14.0 standard drinks    Types: 14 Glasses of wine per week  . Drug use: No  . Sexual activity: Not on file  Lifestyle  . Physical activity    Days per week: Not on file    Minutes per session: Not on file  . Stress: Not on file  Relationships  . Social Herbalist on phone: Not on file    Gets together: Not on file    Attends religious service: Not on file    Active member  of club or organization: Not on file    Attends meetings of clubs or organizations: Not on file    Relationship status: Not on file  . Intimate partner violence    Fear of current or ex partner: Not on file    Emotionally abused: Not on file    Physically abused: Not on file    Forced sexual activity: Not on file  Other Topics Concern  . Not on file  Social History Narrative  . Not on file    FAMILY HISTORY: Family History  Problem Relation Age of Onset  . Leukemia Father   . Diabetes Father   . Heart disease Mother   . Prostate cancer Neg Hx   . Chronic Renal Failure Neg Hx     ALLERGIES:  is allergic to morphine and related and ace inhibitors.  MEDICATIONS:  Current Outpatient Medications  Medication Sig Dispense Refill  . ACCU-CHEK AVIVA PLUS test strip     . amiodarone (PACERONE) 200 MG tablet Take 200 mg by mouth at  bedtime.    Marland Kitchen b complex vitamins tablet Take 1 tablet by mouth daily.    Marland Kitchen EPINEPHrine (EPIPEN 2-PAK) 0.3 mg/0.3 mL IJ SOAJ injection Inject 0.3 mg into the muscle as needed for anaphylaxis.    . furosemide (LASIX) 20 MG tablet Take 20 mg by mouth daily as needed for fluid.     . hydrALAZINE (APRESOLINE) 50 MG tablet Take 50 mg by mouth daily.     . hydrochlorothiazide (HYDRODIURIL) 25 MG tablet Take 25 mg by mouth every morning.     . Iron-Vitamin C 65-125 MG TABS Take 1 tablet by mouth 2 (two) times daily. 180 tablet 0  . metFORMIN (GLUCOPHAGE) 500 MG tablet Take 500 mg by mouth 2 (two) times daily with a meal.     . metoprolol succinate (TOPROL-XL) 25 MG 24 hr tablet Take 25 mg by mouth every morning.    . Omega-3 Fatty Acids (FISH OIL) 1000 MG CAPS Take 1,000 mg by mouth 2 (two) times daily.     Marland Kitchen omeprazole (PRILOSEC) 40 MG capsule Take 40 mg by mouth every morning.     . potassium chloride (K-DUR) 10 MEQ tablet Take 10 mEq by mouth at bedtime.     . rivaroxaban (XARELTO) 20 MG TABS tablet Take 20 mg by mouth at bedtime.    . rosuvastatin (CRESTOR) 20 MG tablet Take 20 mg by mouth at bedtime.     . tamsulosin (FLOMAX) 0.4 MG CAPS capsule Take 1 capsule (0.4 mg total) by mouth daily. 90 capsule 3  . Vitamin D, Ergocalciferol, (DRISDOL) 1.25 MG (50000 UT) CAPS capsule Take 50,000 Units by mouth every 7 (seven) days.     No current facility-administered medications for this visit.      PHYSICAL EXAMINATION: ECOG PERFORMANCE STATUS: 0 - Asymptomatic Vitals:   02/11/19 1301  BP: (!) 141/78  Pulse: (!) 57  Resp: 16  Temp: (!) 97.4 F (36.3 C)   Filed Weights   02/11/19 1301  Weight: 251 lb 6.4 oz (114 kg)    Physical Exam Constitutional:      General: He is not in acute distress.    Appearance: He is well-developed. He is obese.  HENT:     Head: Normocephalic and atraumatic.     Right Ear: External ear normal.     Left Ear: External ear normal.  Eyes:     General: No  scleral icterus.    Conjunctiva/sclera: Conjunctivae  normal.     Pupils: Pupils are equal, round, and reactive to light.  Neck:     Musculoskeletal: Normal range of motion and neck supple.  Cardiovascular:     Rate and Rhythm: Normal rate and regular rhythm.     Heart sounds: Normal heart sounds.  Pulmonary:     Effort: Pulmonary effort is normal. No respiratory distress.     Breath sounds: Normal breath sounds. No wheezing or rales.  Chest:     Chest wall: No tenderness.  Abdominal:     General: Bowel sounds are normal. There is no distension.     Palpations: Abdomen is soft. There is no mass.     Tenderness: There is no abdominal tenderness.  Musculoskeletal: Normal range of motion.        General: No deformity.  Lymphadenopathy:     Cervical: No cervical adenopathy.  Skin:    General: Skin is warm and dry.     Findings: No erythema or rash.  Neurological:     Mental Status: He is alert and oriented to person, place, and time.     Cranial Nerves: No cranial nerve deficit.     Coordination: Coordination normal.  Psychiatric:        Behavior: Behavior normal.        Thought Content: Thought content normal.      LABORATORY DATA:  I have reviewed the data as listed Lab Results  Component Value Date   WBC 4.1 02/08/2019   HGB 14.9 02/08/2019   HCT 43.6 02/08/2019   MCV 91.4 02/08/2019   PLT 145 (L) 02/08/2019   Recent Labs    07/11/18 1132  NA 139  K 3.7  CL 108  CO2 23  GLUCOSE 144*  BUN 13  CREATININE 1.21  CALCIUM 9.4  GFRNONAA >60  GFRAA >60       ASSESSMENT & PLAN:  1. Iron deficiency anemia due to chronic blood loss   2. Blood in stool    # Labs are reviewed and discussed with patient. Stable and normal hemoglobin level.  Iron panel showed improved ferritin level to 23, iron saturation of 25.  Discussed with patient that I will hold additional IV Feraheme at this point.  Recommend patient to continue take Vitron C twice daily.   # Bright  Blood in the stool, hemorrhoids Recommend patient to discuss about surgical options for hemorrhoids.  # History of hematuria, advise patient to follow-up with urology.  All questions were answered. The patient knows to call the clinic with any problems questions or concerns.  Return of visit: 6 months  Earlie Server, MD, PhD  02/11/2019

## 2019-03-22 ENCOUNTER — Other Ambulatory Visit: Payer: Self-pay

## 2019-03-22 ENCOUNTER — Encounter: Payer: Self-pay | Admitting: Urology

## 2019-03-22 ENCOUNTER — Ambulatory Visit (INDEPENDENT_AMBULATORY_CARE_PROVIDER_SITE_OTHER): Payer: Medicare HMO | Admitting: Urology

## 2019-03-22 VITALS — BP 154/68 | HR 74 | Ht 71.0 in | Wt 254.0 lb

## 2019-03-22 DIAGNOSIS — R350 Frequency of micturition: Secondary | ICD-10-CM

## 2019-03-22 DIAGNOSIS — N401 Enlarged prostate with lower urinary tract symptoms: Secondary | ICD-10-CM | POA: Diagnosis not present

## 2019-03-22 LAB — URINALYSIS, COMPLETE
Bilirubin, UA: NEGATIVE
Glucose, UA: NEGATIVE
Leukocytes,UA: NEGATIVE
Nitrite, UA: NEGATIVE
Protein,UA: NEGATIVE
RBC, UA: NEGATIVE
Specific Gravity, UA: >1.03 — ABNORMAL HIGH (ref 1.005–1.030)
Urobilinogen, Ur: 0.2 mg/dL (ref 0.2–1.0)
pH, UA: 6 (ref 5.0–7.5)

## 2019-03-22 LAB — MICROSCOPIC EXAMINATION

## 2019-03-22 MED ORDER — TAMSULOSIN HCL 0.4 MG PO CAPS: 0.8000 mg | ORAL_CAPSULE | Freq: Every day | ORAL | 3 refills | Status: DC

## 2019-03-22 NOTE — Progress Notes (Signed)
03/22/2019 9:26 AM   [place] [birth year] [id number]  Referring provider: Perrin Maltese, MD [place],  [place] [phone]  Chief Complaint  Patient presents with  . Follow-up    HPI: 68 y.o. male followed for BPH and a history of initial hematuria.  Cystoscopy showed BPH.  Prostate volume by ultrasound approximately 45 cc.  He denies recurrent hematuria.  He does have bothersome lower urinary tract symptoms including urinary hesitancy, intermittent urinary stream, decreased force and caliber stream and nocturia x2.  He remains on tamsulosin.   PMH: Past Medical History:  Diagnosis Date  . Arthritis   . Atrial fibrillation ([place])   . BPH (benign prostatic hyperplasia)   . Cancer (HCC)    HX SKIN CANCER Basal cell  . Chickenpox   . Clotting disorder ([place])   . Diabetes mellitus without complication ([place])    type 2  . Dysrhythmia    IRREG HEART BEAT  . GERD (gastroesophageal reflux disease)   . H/O pleurisy   . Hypercholesteremia   . Hyperlipidemia   . Hypertension   . Iron deficiency anemia due to chronic blood loss 09/18/2017  . Lumbar stenosis   . Measles   . Mumps   . Sleep apnea    sleep study Dr. Chancy Milroy, uses CPAP    Surgical History: Past Surgical History:  Procedure Laterality Date  . ANTERIOR CERVICAL DECOMP/DISCECTOMY FUSION  11/25/2011   Procedure: ANTERIOR CERVICAL DECOMPRESSION/DISCECTOMY FUSION 2 LEVELS;  Surgeon: Floyce Stakes, MD;  [facility]: [facility];  Service: Neurosurgery;  Laterality: N/A;  Cervical four-five,Cervical five-six  Anterior cervical decompression/diskectomy, fusion, plate  . BREAST SURGERY     lumpectomy  . CARDIAC CATHETERIZATION     2011, [facility]  . CARDIOVASCULAR STRESS TEST  2011  . CERVICAL FUSION     C 6/7   . COLONOSCOPY WITH PROPOFOL N/A 11/08/2017   Procedure: COLONOSCOPY WITH PROPOFOL;  Surgeon: Toledo, Benay Pike, MD;  Location: [facility];  Service: Gastroenterology;  Laterality: N/A;  .  ESOPHAGOGASTRODUODENOSCOPY (EGD) WITH PROPOFOL N/A 11/08/2017   Procedure: ESOPHAGOGASTRODUODENOSCOPY (EGD) WITH PROPOFOL;  Surgeon: Toledo, Benay Pike, MD;  Location: [facility];  Service: Gastroenterology;  Laterality: N/A;  . EYE SURGERY     LASIK  . JOINT REPLACEMENT    . KNEE ARTHROPLASTY Right 10/05/2015   Procedure: COMPUTER ASSISTED TOTAL KNEE ARTHROPLASTY;  Surgeon: Dereck Leep, MD;  Location: [facility];  Service: Orthopedics;  Laterality: Right;  . KNEE ARTHROSCOPY     Right  . KNEE ARTHROSCOPY Left 08/10/2015   Procedure: LEFT KNEE ARTHROSCOPY, CHONDROPLASTY, MEDIAL MENISECTOMY;  Surgeon: Dereck Leep, MD;  Location: [facility];  Service: Orthopedics;  Laterality: Left;  . LAMINECTOMY WITH POSTERIOR LATERAL ARTHRODESIS LEVEL 2 N/A 07/16/2018   Procedure: Posterior lumbar fusion with instrumentation at L3-4 with repeat facetectomy L3-4;  Surgeon: Eustace Moore, MD;  Location: [place];  Service: Neurosurgery;  Laterality: N/A;  Posterior lumbar fusion with instrumentation at L3-4 with repeat facetectomy L3-4  . LUMBAR LAMINECTOMY/DECOMPRESSION MICRODISCECTOMY N/A 10/04/2013   Procedure: LUMBAR TWO TO THREE LUMBAR LAMINECTOMY/DECOMPRESSION MICRODISCECTOMY 1 LEVEL;  Surgeon: Floyce Stakes, MD;  [facility];  Service: Neurosurgery;  Laterality: N/A;  L2-3 Laminectomy  . POSTERIOR LAMINECTOMY / DECOMPRESSION LUMBAR SPINE    . TRANSESOPHAGEAL ECHOCARDIOGRAM  2011    Home Medications:  Allergies as of 03/22/2019      Reactions   Morphine And Related Anaphylaxis   Ace Inhibitors  Swelling      Medication List       Accurate as of March 22, 2019  9:26 AM. If you have any questions, ask your nurse or doctor.        Accu-Chek Aviva Plus test strip Generic drug: glucose blood   amiodarone 200 MG tablet Commonly known as: PACERONE Take 200 mg by mouth at bedtime.   b complex vitamins tablet Take 1 tablet by mouth daily.   EpiPen 2-Pak 0.3 mg/0.3 mL Soaj injection  Generic drug: EPINEPHrine Inject 0.3 mg into the muscle as needed for anaphylaxis.   Fish Oil 1000 MG Caps Take 1,000 mg by mouth 2 (two) times daily.   furosemide 20 MG tablet Commonly known as: LASIX Take 20 mg by mouth daily as needed for fluid.   hydrALAZINE 50 MG tablet Commonly known as: APRESOLINE Take 50 mg by mouth daily.   hydrochlorothiazide 25 MG tablet Commonly known as: HYDRODIURIL Take 25 mg by mouth every morning.   Iron-Vitamin C 65-125 MG Tabs Take 1 tablet by mouth 2 (two) times daily.   metFORMIN 500 MG tablet Commonly known as: GLUCOPHAGE Take 500 mg by mouth 2 (two) times daily with a meal.   metoprolol succinate 25 MG 24 hr tablet Commonly known as: TOPROL-XL Take 25 mg by mouth every morning.   omeprazole 40 MG capsule Commonly known as: PRILOSEC Take 40 mg by mouth every morning.   potassium chloride 10 MEQ tablet Commonly known as: KLOR-CON Take 10 mEq by mouth at bedtime.   rivaroxaban 20 MG Tabs tablet Commonly known as: XARELTO Take 20 mg by mouth at bedtime.   rosuvastatin 20 MG tablet Commonly known as: CRESTOR Take 20 mg by mouth at bedtime.   tamsulosin 0.4 MG Caps capsule Commonly known as: FLOMAX Take 1 capsule (0.4 mg total) by mouth daily.   Vitamin D (Ergocalciferol) 1.25 MG (50000 UT) Caps capsule Commonly known as: DRISDOL Take 50,000 Units by mouth every 7 (seven) days.       Allergies:  Allergies  Allergen Reactions  . Morphine And Related Anaphylaxis  . Ace Inhibitors Swelling    Family History: Family History  Problem Relation Age of Onset  . Leukemia Father   . Diabetes Father   . Heart disease Mother   . Prostate cancer Neg Hx   . Chronic Renal Failure Neg Hx     Social History:  reports that he has never smoked. He has never used smokeless tobacco. He reports current alcohol use of about 14.0 standard drinks of alcohol per week. He reports that he does not use drugs.  ROS: UROLOGY Frequent  Urination?: Yes Hard to postpone urination?: No Burning/pain with urination?: No Get up at night to urinate?: Yes Leakage of urine?: No Urine stream starts and stops?: Yes Trouble starting stream?: Yes Do you have to strain to urinate?: Yes Blood in urine?: No Urinary tract infection?: No Sexually transmitted disease?: No Injury to kidneys or bladder?: No Painful intercourse?: No Weak stream?: No Erection problems?: No Penile pain?: No  Gastrointestinal Nausea?: No Vomiting?: No Indigestion/heartburn?: No Diarrhea?: No Constipation?: No  Constitutional Fever: No Night sweats?: No Weight loss?: No Fatigue?: No  Skin Skin rash/lesions?: No Itching?: No  Eyes Blurred vision?: Yes Double vision?: No  Ears/Nose/Throat Sore throat?: No Sinus problems?: No  Hematologic/Lymphatic Swollen glands?: No Easy bruising?: Yes  Cardiovascular Leg swelling?: Yes Chest pain?: No  Respiratory Cough?: No Shortness of breath?: No  Endocrine Excessive thirst?: No  Musculoskeletal Back pain?: Yes Joint pain?: No  Neurological Headaches?: No Dizziness?: No  Psychologic Depression?: No Anxiety?: No  Physical Exam: BP (!) 154/68   Pulse 74   Ht 5\' 11"  (1.803 m)   Wt 254 lb (115.2 kg)   BMI 35.43 kg/m   Constitutional:  Alert and oriented, No acute distress. HEENT: [place] AT, moist mucus membranes.  Trachea midline, no masses. Cardiovascular: No clubbing, cyanosis, or edema. Respiratory: Normal respiratory effort, no increased work of breathing. Skin: No rashes, bruises or suspicious lesions. Neurologic: Grossly intact, no focal deficits, moving all 4 extremities. Psychiatric: Normal mood and affect.   Assessment & Plan:    - BPH with lower urinary tract symptoms Management options were discussed including increasing tamsulosin to 0.8 mg, adding a 5-ARI.  We did discuss minimally invasive options including Rezum and UroLift.  He has had a cystoscopy and  prostate volume by ultrasound and would be a candidate for UroLift.  He has elected titrating tamsulosin.  Continue annual follow-up and he will call earlier for worsening voiding symptoms or if he is interested in minimally invasive options.   Abbie Sons, [place] [address] [place], [place] [address] [phone]

## 2019-04-12 DIAGNOSIS — G4733 Obstructive sleep apnea (adult) (pediatric): Secondary | ICD-10-CM | POA: Insufficient documentation

## 2019-05-02 ENCOUNTER — Other Ambulatory Visit: Payer: Medicare HMO

## 2019-05-06 ENCOUNTER — Other Ambulatory Visit: Payer: Medicare HMO

## 2019-06-13 ENCOUNTER — Ambulatory Visit: Payer: Medicare HMO | Attending: Internal Medicine

## 2019-06-13 ENCOUNTER — Other Ambulatory Visit: Payer: Self-pay

## 2019-06-13 DIAGNOSIS — Z23 Encounter for immunization: Secondary | ICD-10-CM | POA: Insufficient documentation

## 2019-06-13 NOTE — Progress Notes (Signed)
   Covid-19 Vaccination Clinic  Name:  Brandon Gibson    MRN: [id number] DOB: 1950/12/12  06/13/2019  [name] was observed post Covid-19 immunization for 15 minutes without incidence. He was provided with Vaccine Information Sheet and instruction to access the V-Safe system.   [name] was instructed to call 911 with any severe reactions post vaccine: Marland Kitchen Difficulty breathing  . Swelling of your face and throat  . A fast heartbeat  . A bad rash all over your body  . Dizziness and weakness    Immunizations Administered    Name Date Dose VIS Date Route   Pfizer COVID-19 Vaccine 06/13/2019 11:44 AM 0.3 mL 04/12/2019 Intramuscular   Manufacturer: [place]   Lot: [id number]   NDC: [id number]

## 2019-07-09 ENCOUNTER — Ambulatory Visit: Payer: Medicare HMO | Attending: Internal Medicine

## 2019-07-09 DIAGNOSIS — Z23 Encounter for immunization: Secondary | ICD-10-CM

## 2019-07-09 NOTE — Progress Notes (Signed)
   Covid-19 Vaccination Clinic  Name:  Brandon Gibson    MRN: [id number] DOB: [birth year]  07/09/2019  Mr. Brandon Gibson was observed post Covid-19 immunization for 15 minutes without incident. He was provided with Vaccine Information Sheet and instruction to access the V-Safe system.   Mr. Brandon Gibson was instructed to call 911 with any severe reactions post vaccine: Marland Kitchen Difficulty breathing  . Swelling of face and throat  . A fast heartbeat  . A bad rash all over body  . Dizziness and weakness   Immunizations Administered    Name Date Dose VIS Date Route   Pfizer COVID-19 Vaccine 07/09/2019 10:06 AM 0.3 mL 04/12/2019 Intramuscular   Manufacturer: [place]   Lot: [id number]   [place]: [id number]

## 2019-08-12 ENCOUNTER — Inpatient Hospital Stay: Payer: Medicare HMO

## 2019-08-12 ENCOUNTER — Inpatient Hospital Stay: Payer: Medicare HMO | Attending: Oncology | Admitting: Oncology

## 2019-08-12 ENCOUNTER — Encounter: Payer: Self-pay | Admitting: Oncology

## 2019-08-12 ENCOUNTER — Other Ambulatory Visit: Payer: Self-pay

## 2019-08-12 VITALS — BP 126/78 | HR 71 | Temp 97.1°F | Resp 16 | Wt 252.8 lb

## 2019-08-12 DIAGNOSIS — Z806 Family history of leukemia: Secondary | ICD-10-CM | POA: Diagnosis not present

## 2019-08-12 DIAGNOSIS — K5730 Diverticulosis of large intestine without perforation or abscess without bleeding: Secondary | ICD-10-CM | POA: Insufficient documentation

## 2019-08-12 DIAGNOSIS — Z833 Family history of diabetes mellitus: Secondary | ICD-10-CM | POA: Insufficient documentation

## 2019-08-12 DIAGNOSIS — Z79899 Other long term (current) drug therapy: Secondary | ICD-10-CM | POA: Insufficient documentation

## 2019-08-12 DIAGNOSIS — R5383 Other fatigue: Secondary | ICD-10-CM | POA: Insufficient documentation

## 2019-08-12 DIAGNOSIS — Z8249 Family history of ischemic heart disease and other diseases of the circulatory system: Secondary | ICD-10-CM | POA: Diagnosis not present

## 2019-08-12 DIAGNOSIS — M1990 Unspecified osteoarthritis, unspecified site: Secondary | ICD-10-CM | POA: Diagnosis not present

## 2019-08-12 DIAGNOSIS — Z885 Allergy status to narcotic agent status: Secondary | ICD-10-CM | POA: Diagnosis not present

## 2019-08-12 DIAGNOSIS — Z7901 Long term (current) use of anticoagulants: Secondary | ICD-10-CM | POA: Insufficient documentation

## 2019-08-12 DIAGNOSIS — K648 Other hemorrhoids: Secondary | ICD-10-CM | POA: Insufficient documentation

## 2019-08-12 DIAGNOSIS — I482 Chronic atrial fibrillation, unspecified: Secondary | ICD-10-CM | POA: Insufficient documentation

## 2019-08-12 DIAGNOSIS — K921 Melena: Secondary | ICD-10-CM

## 2019-08-12 DIAGNOSIS — D120 Benign neoplasm of cecum: Secondary | ICD-10-CM | POA: Insufficient documentation

## 2019-08-12 DIAGNOSIS — K317 Polyp of stomach and duodenum: Secondary | ICD-10-CM | POA: Diagnosis not present

## 2019-08-12 DIAGNOSIS — Z85828 Personal history of other malignant neoplasm of skin: Secondary | ICD-10-CM | POA: Insufficient documentation

## 2019-08-12 DIAGNOSIS — D500 Iron deficiency anemia secondary to blood loss (chronic): Secondary | ICD-10-CM

## 2019-08-12 DIAGNOSIS — Z86718 Personal history of other venous thrombosis and embolism: Secondary | ICD-10-CM | POA: Diagnosis not present

## 2019-08-12 LAB — CBC WITH DIFFERENTIAL/PLATELET
Abs Immature Granulocytes: 0.03 10*3/uL (ref 0.00–0.07)
Basophils Absolute: 0 10*3/uL (ref 0.0–0.1)
Basophils Relative: 1 %
Eosinophils Absolute: 0.1 10*3/uL (ref 0.0–0.5)
Eosinophils Relative: 2 %
HCT: 43.2 % (ref 39.0–52.0)
Hemoglobin: 15.2 g/dL (ref 13.0–17.0)
Immature Granulocytes: 1 %
Lymphocytes Relative: 26 %
Lymphs Abs: 1.1 10*3/uL (ref 0.7–4.0)
MCH: 31.3 pg (ref 26.0–34.0)
MCHC: 35.2 g/dL (ref 30.0–36.0)
MCV: 89.1 fL (ref 80.0–100.0)
Monocytes Absolute: 0.4 10*3/uL (ref 0.1–1.0)
Monocytes Relative: 11 %
Neutro Abs: 2.5 10*3/uL (ref 1.7–7.7)
Neutrophils Relative %: 59 %
Platelets: 155 10*3/uL (ref 150–400)
RBC: 4.85 MIL/uL (ref 4.22–5.81)
RDW: 13.7 % (ref 11.5–15.5)
WBC: 4.2 10*3/uL (ref 4.0–10.5)
nRBC: 0 % (ref 0.0–0.2)

## 2019-08-12 LAB — IRON AND TIBC
Iron: 112 ug/dL (ref 45–182)
Saturation Ratios: 25 % (ref 17.9–39.5)
TIBC: 451 ug/dL — ABNORMAL HIGH (ref 250–450)
UIBC: 339 ug/dL

## 2019-08-12 LAB — COMPREHENSIVE METABOLIC PANEL
ALT: 41 U/L (ref 0–44)
AST: 32 U/L (ref 15–41)
Albumin: 4.3 g/dL (ref 3.5–5.0)
Alkaline Phosphatase: 49 U/L (ref 38–126)
Anion gap: 10 (ref 5–15)
BUN: 14 mg/dL (ref 8–23)
CO2: 23 mmol/L (ref 22–32)
Calcium: 9.5 mg/dL (ref 8.9–10.3)
Chloride: 103 mmol/L (ref 98–111)
Creatinine, Ser: 1.14 mg/dL (ref 0.61–1.24)
GFR calc Af Amer: >60 mL/min (ref >60)
GFR calc non Af Amer: >60 mL/min (ref >60)
Glucose, Bld: 147 mg/dL — ABNORMAL HIGH (ref 70–99)
Potassium: 3.5 mmol/L (ref 3.5–5.1)
Sodium: 136 mmol/L (ref 135–145)
Total Bilirubin: 0.8 mg/dL (ref 0.3–1.2)
Total Protein: 6.8 g/dL (ref 6.5–8.1)

## 2019-08-12 LAB — FERRITIN: Ferritin: 17 ng/mL — ABNORMAL LOW (ref 24–336)

## 2019-08-12 NOTE — Progress Notes (Signed)
Patient does not offer any problems today.  

## 2019-08-12 NOTE — Progress Notes (Signed)
Hematology/Oncology follow up note Brandon Gibson [phone] [phone]   Patient Care Team: Perrin Maltese, MD as PCP - General (Internal Medicine)  REFERRING PROVIDER: Perrin Maltese, MD  REASON FOR VISIT Follow up for treatment of iron deficiency.   HISTORY OF PRESENTING ILLNESS:  Brandon Gibson is a  69 y.o.  male with PMH listed below who was referred to me for evaluation of low iron.  He takes Xarelto for chronic atrial fibrillation and history of unprovoked right lower extremity DVT.  He has noticed bright red blood in the stool.  His last colonoscopy was 7 to 8 years ago.  He has already had an appointment with [facility] clinic in June. He recently had lab work done with Dr. Humphrey Rolls which showed mild anemia with hemoglobin around 11, low iron saturation, ferritin is also decreased meeting diagnostic criteria for iron deficiency anemia. Patient reports overall doing well.  No weight loss, epigastric pain, abdominal pain, fever or chills.  s/p IV feraheme 510mg  x 2.  On 10/24/2017, CBC showed WBC 10.7, hemoglobin 14.2, MCV 81, platelet 1 56,000, neutrophil 9.4, foresight 0.5 chemistry showed creatinine 0.94, calcium 9.4, bilirubin 0.2, serum iron 79, iron saturation 20%, TIBC 393, ferritin 115.   # Patient has had labs done at [facility] in the results were personally reviewed by me. 01/30/2018 WBC 4.7, hemoglobin 14.9, MCV 90, platelet count 157, normal differential. Fatigue is better.  During the interval patient has had extensive gastroenterology work-up. 11/08/2017 colonoscopy showed diverticulosis in the ascending colon and in the cecum. - One 3 mm polyp in the cecum, removed with a jumbo cold forceps. Resected and retrieved. - One 5 mm polyp in the cecum, removed with a cold snare. Resected and retrieved. - Non-bleeding internal hemorrhoids. - The examination was otherwise normal. Same-day endoscopy showed gastric polyps and  Erythematous mucosa in the antrum Biopsy negative for malignancy.   INTERVAL HISTORY Brandon Gibson is a 69 y.o. male who has above history reviewed by me today presents for follow up visit for for management of iron deficiency.  He now take Vitron C daily.  Continue to have intermittent blood in the stool, attributing to hemorrhoids.  No new complaints. Feels well.   Review of Systems  Constitutional: Negative for chills, fever, malaise/fatigue and weight loss.  HENT: Negative for sore throat.   Eyes: Negative for redness.  Respiratory: Negative for cough, shortness of breath and wheezing.   Cardiovascular: Negative for chest pain, palpitations and leg swelling.  Gastrointestinal: Negative for abdominal pain, blood in stool, nausea and vomiting.  Genitourinary: Negative for dysuria.  Musculoskeletal: Negative for myalgias.  Skin: Negative for rash.  Neurological: Negative for dizziness, tingling and tremors.  Endo/Heme/Allergies: Does not bruise/bleed easily.  Psychiatric/Behavioral: Negative for hallucinations.    MEDICAL HISTORY:  Past Medical History:  Diagnosis Date  . Arthritis   . Atrial fibrillation ([place])   . BPH (benign prostatic hyperplasia)   . Cancer (HCC)    HX SKIN CANCER Basal cell  . Chickenpox   . Clotting disorder ([place])   . Diabetes mellitus without complication ([place])    type 2  . Dysrhythmia    IRREG HEART BEAT  . GERD (gastroesophageal reflux disease)   . H/O pleurisy   . Hypercholesteremia   . Hyperlipidemia   . Hypertension   . Iron deficiency anemia due to chronic blood loss 09/18/2017  . Lumbar stenosis   . Measles   . Mumps   .  Sleep apnea    sleep study Dr. Chancy Milroy, uses CPAP    SURGICAL HISTORY: Past Surgical History:  Procedure Laterality Date  . ANTERIOR CERVICAL DECOMP/DISCECTOMY FUSION  11/25/2011   Procedure: ANTERIOR CERVICAL DECOMPRESSION/DISCECTOMY FUSION 2 LEVELS;  Surgeon: Floyce Stakes, MD;  [facility];   Service: Neurosurgery;  Laterality: N/A;  Cervical four-five,Cervical five-six  Anterior cervical decompression/diskectomy, fusion, plate  . BREAST SURGERY     lumpectomy  . CARDIAC CATHETERIZATION     2011, [facility] [place]  . CARDIOVASCULAR STRESS TEST  2011  . CERVICAL FUSION     C 6/7   . COLONOSCOPY WITH PROPOFOL N/A 11/08/2017   Procedure: COLONOSCOPY WITH PROPOFOL;  Surgeon: Toledo, Benay Pike, MD;  Location: [facility];  Service: Gastroenterology;  Laterality: N/A;  . ESOPHAGOGASTRODUODENOSCOPY (EGD) WITH PROPOFOL N/A 11/08/2017   Procedure: ESOPHAGOGASTRODUODENOSCOPY (EGD) WITH PROPOFOL;  Surgeon: Toledo, Benay Pike, MD;  Location: [facility];  Service: Gastroenterology;  Laterality: N/A;  . EYE SURGERY     LASIK  . JOINT REPLACEMENT    . KNEE ARTHROPLASTY Right 10/05/2015   Procedure: COMPUTER ASSISTED TOTAL KNEE ARTHROPLASTY;  Surgeon: Dereck Leep, MD;  Location: [facility];  Service: Orthopedics;  Laterality: Right;  . KNEE ARTHROSCOPY     Right  . KNEE ARTHROSCOPY Left 08/10/2015   Procedure: LEFT KNEE ARTHROSCOPY, CHONDROPLASTY, MEDIAL MENISECTOMY;  Surgeon: Dereck Leep, MD;  Location: [facility];  Service: Orthopedics;  Laterality: Left;  . LAMINECTOMY WITH POSTERIOR LATERAL ARTHRODESIS LEVEL 2 N/A 07/16/2018   Procedure: Posterior lumbar fusion with instrumentation at L3-4 with repeat facetectomy L3-4;  Surgeon: Eustace Moore, MD;  Location: [place];  Service: Neurosurgery;  Laterality: N/A;  Posterior lumbar fusion with instrumentation at L3-4 with repeat facetectomy L3-4  . LUMBAR LAMINECTOMY/DECOMPRESSION MICRODISCECTOMY N/A 10/04/2013   Procedure: LUMBAR TWO TO THREE LUMBAR LAMINECTOMY/DECOMPRESSION MICRODISCECTOMY 1 LEVEL;  Surgeon: Floyce Stakes, MD;  [facility];  Service: Neurosurgery;  Laterality: N/A;  L2-3 Laminectomy  . POSTERIOR LAMINECTOMY / DECOMPRESSION LUMBAR SPINE    . TRANSESOPHAGEAL ECHOCARDIOGRAM  2011    SOCIAL HISTORY: Social History    Socioeconomic History  . Marital status: Widowed    Spouse name: Not on file  . Number of children: Not on file  . Years of education: Not on file  . Highest education level: Not on file  Occupational History  . Not on file  Tobacco Use  . Smoking status: Never Smoker  . Smokeless tobacco: Never Used  Substance and Sexual Activity  . Alcohol use: Yes    Alcohol/week: 14.0 standard drinks    Types: 14 Glasses of wine per week  . Drug use: No  . Sexual activity: Not on file  Other Topics Concern  . Not on file  Social History Narrative  . Not on file   Social Determinants of Health   Financial Resource Strain:   . Difficulty of Paying Living Expenses:   Food Insecurity:   . Worried About Charity fundraiser in the Last Year:   . Arboriculturist in the Last Year:   Transportation Needs:   . Film/video editor (Medical):   Marland Kitchen Lack of Transportation (Non-Medical):   Physical Activity:   . Days of Exercise per Week:   . Minutes of Exercise per Session:   Stress:   . Feeling of Stress :   Social Connections:   . Frequency of Communication with Friends and Family:   . Frequency  of Social Gatherings with Friends and Family:   . Attends Religious Services:   . Active Member of Clubs or Organizations:   . Attends Archivist Meetings:   Marland Kitchen Marital Status:   Intimate Partner Violence:   . Fear of Current or Ex-Partner:   . Emotionally Abused:   Marland Kitchen Physically Abused:   . Sexually Abused:     FAMILY HISTORY: Family History  Problem Relation Age of Onset  . Leukemia Father   . Diabetes Father   . Heart disease Mother   . Prostate cancer Neg Hx   . Chronic Renal Failure Neg Hx     ALLERGIES:  is allergic to morphine and related and ace inhibitors.  MEDICATIONS:  Current Outpatient Medications  Medication Sig Dispense Refill  . ACCU-CHEK AVIVA PLUS test strip     . amiodarone (PACERONE) 200 MG tablet Take 200 mg by mouth at bedtime.    Marland Kitchen b complex  vitamins tablet Take 1 tablet by mouth daily.    Marland Kitchen EPINEPHrine (EPIPEN 2-PAK) 0.3 mg/0.3 mL IJ SOAJ injection Inject 0.3 mg into the muscle as needed for anaphylaxis.    . furosemide (LASIX) 20 MG tablet Take 20 mg by mouth daily as needed for fluid.     . hydrALAZINE (APRESOLINE) 50 MG tablet Take 50 mg by mouth daily.     . Iron-Vitamin C 65-125 MG TABS Take 1 tablet by mouth 2 (two) times daily. 180 tablet 1  . metFORMIN (GLUCOPHAGE) 500 MG tablet Take 500 mg by mouth 2 (two) times daily with a meal.     . metoprolol succinate (TOPROL-XL) 25 MG 24 hr tablet Take 25 mg by mouth every morning.    . Omega-3 Fatty Acids (FISH OIL) 1000 MG CAPS Take 1,000 mg by mouth 2 (two) times daily.     Marland Kitchen omeprazole (PRILOSEC) 40 MG capsule Take 40 mg by mouth every morning.     . potassium chloride (K-DUR) 10 MEQ tablet Take 10 mEq by mouth at bedtime.     . rivaroxaban (XARELTO) 20 MG TABS tablet Take 20 mg by mouth at bedtime.    . rosuvastatin (CRESTOR) 20 MG tablet Take 20 mg by mouth at bedtime.     . tamsulosin (FLOMAX) 0.4 MG CAPS capsule Take 2 capsules (0.8 mg total) by mouth daily. 180 capsule 3  . chlorthalidone (HYGROTON) 25 MG tablet     . hydrochlorothiazide (HYDRODIURIL) 25 MG tablet Take 25 mg by mouth every morning.     . Vitamin D, Ergocalciferol, (DRISDOL) 1.25 MG (50000 UT) CAPS capsule Take 50,000 Units by mouth every 7 (seven) days.     No current facility-administered medications for this visit.     PHYSICAL EXAMINATION: ECOG PERFORMANCE STATUS: 0 - Asymptomatic Vitals:   08/12/19 0947  BP: 126/78  Pulse: 71  Resp: 16  Temp: (!) 97.1 F (36.2 C)   Filed Weights   08/12/19 0947  Weight: 252 lb 12.8 oz (114.7 kg)    Physical Exam Constitutional:      General: He is not in acute distress.    Appearance: He is well-developed. He is obese.  HENT:     Head: Normocephalic and atraumatic.     Right Ear: External ear normal.     Left Ear: External ear normal.  Eyes:      General: No scleral icterus.    Conjunctiva/sclera: Conjunctivae normal.     Pupils: Pupils are equal, round, and reactive to light.  Cardiovascular:  Rate and Rhythm: Normal rate and regular rhythm.     Heart sounds: Normal heart sounds.  Pulmonary:     Effort: Pulmonary effort is normal. No respiratory distress.     Breath sounds: Normal breath sounds. No wheezing or rales.  Chest:     Chest wall: No tenderness.  Abdominal:     General: Bowel sounds are normal. There is no distension.     Palpations: Abdomen is soft. There is no mass.     Tenderness: There is no abdominal tenderness.  Musculoskeletal:        General: No deformity. Normal range of motion.     Cervical back: Normal range of motion and neck supple.  Lymphadenopathy:     Cervical: No cervical adenopathy.  Skin:    General: Skin is warm and dry.     Findings: No erythema or rash.  Neurological:     Mental Status: He is alert and oriented to person, place, and time. Mental status is at baseline.     Cranial Nerves: No cranial nerve deficit.     Coordination: Coordination normal.  Psychiatric:        Mood and Affect: Mood normal.      LABORATORY DATA:  I have reviewed the data as listed Lab Results  Component Value Date   WBC 4.2 08/12/2019   HGB 15.2 08/12/2019   HCT 43.2 08/12/2019   MCV 89.1 08/12/2019   PLT 155 08/12/2019   Recent Labs    08/12/19 0930  NA 136  K 3.5  CL 103  CO2 23  GLUCOSE 147*  BUN 14  CREATININE 1.14  CALCIUM 9.5  GFRNONAA >60  GFRAA >60  PROT 6.8  ALBUMIN 4.3  AST 32  ALT 41  ALKPHOS 49  BILITOT 0.8       ASSESSMENT & PLAN:  1. Iron deficiency anemia due to chronic blood loss   2. Blood in stool    Iron deficiency Labs are reviewed and discussed with patient. Hemoglobin is stable. No anemia. No need for IV iron at this point.  Continue Vitron C once daily.   # Bright Blood in the stool, hemorrhoids, discussed about him about follow up with GI and  consider resection of hemorrhoids.    All questions were answered. The patient knows to call the clinic with any problems questions or concerns.  Return of visit:  He will be discharged to follow up with PCP. I am happy to see him again if he developed new related  concerns of symptoms   Earlie Server, MD, PhD  08/12/2019

## 2019-08-13 ENCOUNTER — Other Ambulatory Visit: Payer: Self-pay | Admitting: Internal Medicine

## 2019-08-13 DIAGNOSIS — N62 Hypertrophy of breast: Secondary | ICD-10-CM

## 2019-08-22 ENCOUNTER — Ambulatory Visit
Admission: RE | Admit: 2019-08-22 | Discharge: 2019-08-22 | Disposition: A | Discharge status: Home / Self Care | Payer: Medicare HMO | Source: Ambulatory Visit | Attending: Internal Medicine | Admitting: Internal Medicine

## 2019-08-22 DIAGNOSIS — N62 Hypertrophy of breast: Secondary | ICD-10-CM

## 2019-09-02 ENCOUNTER — Other Ambulatory Visit: Payer: Self-pay | Admitting: Internal Medicine

## 2019-09-04 ENCOUNTER — Other Ambulatory Visit: Payer: Self-pay

## 2019-09-04 ENCOUNTER — Encounter: Payer: Self-pay | Admitting: Urology

## 2019-09-04 ENCOUNTER — Ambulatory Visit: Payer: Medicare HMO | Admitting: Urology

## 2019-09-04 VITALS — BP 135/78 | HR 51 | Ht 70.0 in | Wt 252.0 lb

## 2019-09-04 DIAGNOSIS — N401 Enlarged prostate with lower urinary tract symptoms: Secondary | ICD-10-CM | POA: Diagnosis not present

## 2019-09-04 DIAGNOSIS — N62 Hypertrophy of breast: Secondary | ICD-10-CM | POA: Diagnosis not present

## 2019-09-04 NOTE — Progress Notes (Signed)
09/04/2019 9:18 AM   [place] 04/28/1951 [id number]  Referring provider: Perrin Maltese, MD [place],  [place] [id number]  Chief Complaint  Patient presents with  . Benign Prostatic Hypertrophy    HPI: 69 y.o. male followed for BPH.  He presents today complaining of gynecomastia of recent onset.  He states this occurred in 2015 and regressed with a change in his medications.  He has had excision of the right breast tissue in the past.  He also complains of left breast tenderness in addition to enlargement.  He states his PCP checked his testosterone level which was low.  Mammogram performed April 2021 showed no lesions suspicious for breast cancer.  I testosterone level performed 08/26/2019 was 121.  Estrogen level was in the upper limit of normal at 91 pg/mL.  PMH: Past Medical History:  Diagnosis Date  . Arthritis   . Atrial fibrillation ([place])   . BPH (benign prostatic hyperplasia)   . Cancer (HCC)    HX SKIN CANCER Basal cell  . Chickenpox   . Clotting disorder ([place])   . Diabetes mellitus without complication ([place])    type 2  . Dysrhythmia    IRREG HEART BEAT  . GERD (gastroesophageal reflux disease)   . H/O pleurisy   . Hypercholesteremia   . Hyperlipidemia   . Hypertension   . Iron deficiency anemia due to chronic blood loss 09/18/2017  . Lumbar stenosis   . Measles   . Mumps   . Sleep apnea    sleep study Dr. Chancy Milroy, uses CPAP    Surgical History: Past Surgical History:  Procedure Laterality Date  . ANTERIOR CERVICAL DECOMP/DISCECTOMY FUSION  11/25/2011   Procedure: ANTERIOR CERVICAL DECOMPRESSION/DISCECTOMY FUSION 2 LEVELS;  Surgeon: Floyce Stakes, MD;  Location: [facility];  Service: Neurosurgery;  Laterality: N/A;  Cervical four-five,Cervical five-six  Anterior cervical decompression/diskectomy, fusion, plate  . BREAST BIOPSY Right    Benign  . BREAST SURGERY     lumpectomy  . CARDIAC CATHETERIZATION     2011, [facility]  .  CARDIOVASCULAR STRESS TEST  2011  . CERVICAL FUSION     C 6/7   . COLONOSCOPY WITH PROPOFOL N/A 11/08/2017   Procedure: COLONOSCOPY WITH PROPOFOL;  Surgeon: Toledo, Benay Pike, MD;  Location: [facility];  Service: Gastroenterology;  Laterality: N/A;  . ESOPHAGOGASTRODUODENOSCOPY (EGD) WITH PROPOFOL N/A 11/08/2017   Procedure: ESOPHAGOGASTRODUODENOSCOPY (EGD) WITH PROPOFOL;  Surgeon: Toledo, Benay Pike, MD;  Location: [facility];  Service: Gastroenterology;  Laterality: N/A;  . EYE SURGERY     LASIK  . JOINT REPLACEMENT    . KNEE ARTHROPLASTY Right 10/05/2015   Procedure: COMPUTER ASSISTED TOTAL KNEE ARTHROPLASTY;  Surgeon: Dereck Leep, MD;  Location: [facility];  Service: Orthopedics;  Laterality: Right;  . KNEE ARTHROSCOPY     Right  . KNEE ARTHROSCOPY Left 08/10/2015   Procedure: LEFT KNEE ARTHROSCOPY, CHONDROPLASTY, MEDIAL MENISECTOMY;  Surgeon: Dereck Leep, MD;  Location: [facility];  Service: Orthopedics;  Laterality: Left;  . LAMINECTOMY WITH POSTERIOR LATERAL ARTHRODESIS LEVEL 2 N/A 07/16/2018   Procedure: Posterior lumbar fusion with instrumentation at L3-4 with repeat facetectomy L3-4;  Surgeon: Eustace Moore, MD;  Location: [place];  Service: Neurosurgery;  Laterality: N/A;  Posterior lumbar fusion with instrumentation at L3-4 with repeat facetectomy L3-4  . LUMBAR LAMINECTOMY/DECOMPRESSION MICRODISCECTOMY N/A 10/04/2013   Procedure: LUMBAR TWO TO THREE LUMBAR LAMINECTOMY/DECOMPRESSION MICRODISCECTOMY 1 LEVEL;  Surgeon: Floyce Stakes, MD;  Location:  [place] [facility];  Service: Neurosurgery;  Laterality: N/A;  L2-3 Laminectomy  . POSTERIOR LAMINECTOMY / DECOMPRESSION LUMBAR SPINE    . TRANSESOPHAGEAL ECHOCARDIOGRAM  2011    Home Medications:  Allergies as of 09/04/2019      Reactions   Morphine And Related Anaphylaxis   Ace Inhibitors Swelling      Medication List       Accurate as of Sep 04, 2019  9:18 AM. If you have any questions, ask your nurse or doctor.          Accu-Chek Aviva Plus test strip Generic drug: glucose blood   amiodarone 200 MG tablet Commonly known as: PACERONE Take 200 mg by mouth at bedtime.   b complex vitamins tablet Take 1 tablet by mouth daily.   chlorthalidone 25 MG tablet Commonly known as: HYGROTON   EpiPen 2-Pak 0.3 mg/0.3 mL Soaj injection Generic drug: EPINEPHrine Inject 0.3 mg into the muscle as needed for anaphylaxis.   Fish Oil 1000 MG Caps Take 1,000 mg by mouth 2 (two) times daily.   furosemide 20 MG tablet Commonly known as: LASIX Take 20 mg by mouth daily as needed for fluid.   hydrALAZINE 50 MG tablet Commonly known as: APRESOLINE Take 50 mg by mouth daily.   hydrochlorothiazide 25 MG tablet Commonly known as: HYDRODIURIL Take 25 mg by mouth every morning.   Iron-Vitamin C 65-125 MG Tabs Take 1 tablet by mouth 2 (two) times daily.   metFORMIN 500 MG tablet Commonly known as: GLUCOPHAGE Take 500 mg by mouth 2 (two) times daily with a meal.   metoprolol succinate 25 MG 24 hr tablet Commonly known as: TOPROL-XL Take 25 mg by mouth every morning.   omeprazole 40 MG capsule Commonly known as: PRILOSEC Take 40 mg by mouth every morning.   potassium chloride 10 MEQ tablet Commonly known as: KLOR-CON Take 10 mEq by mouth at bedtime.   rivaroxaban 20 MG Tabs tablet Commonly known as: XARELTO Take 20 mg by mouth at bedtime.   rosuvastatin 20 MG tablet Commonly known as: CRESTOR Take 20 mg by mouth at bedtime.   tamsulosin 0.4 MG Caps capsule Commonly known as: FLOMAX Take 2 capsules (0.8 mg total) by mouth daily.   Vitamin D (Ergocalciferol) 1.25 MG (50000 UNIT) Caps capsule Commonly known as: DRISDOL Take 50,000 Units by mouth every 7 (seven) days.       Allergies:  Allergies  Allergen Reactions  . Morphine And Related Anaphylaxis  . Ace Inhibitors Swelling    Family History: Family History  Problem Relation Age of Onset  . Leukemia Father   . Diabetes Father   .  Heart disease Mother   . Prostate cancer Neg Hx   . Chronic Renal Failure Neg Hx   . Breast cancer Neg Hx     Social History:  reports that he has never smoked. He has never used smokeless tobacco. He reports current alcohol use of about 14.0 standard drinks of alcohol per week. He reports that he does not use drugs.   Physical Exam: BP 135/78   Pulse (!) 51   Ht 5\' 10"  (1.778 m)   Wt 252 lb (114.3 kg)   BMI 36.16 kg/m   Constitutional:  Alert and oriented, No acute distress. HEENT: [place] AT, moist mucus membranes.  Trachea midline, no masses. Cardiovascular: No clubbing, cyanosis, or edema. Chest/respiratory: Normal respiratory effort, no increased work of breathing..  Left gynecomastia GI: Abdomen is soft, nontender, nondistended, no abdominal masses  GU: No CVA tenderness Lymph: No cervical or inguinal lymphadenopathy. Skin: No rashes, bruises or suspicious lesions. Neurologic: Grossly intact, no focal deficits, moving all 4 extremities. Psychiatric: Normal mood and affect.   Assessment & Plan:    - Gynecomastia Recent total testosterone level was significantly low.  Recheck testosterone, LH and estradiol.  He will be notified with results and further recommendations.   Abbie Sons, [place] [address] [place], [place] [address] [phone]

## 2019-09-05 ENCOUNTER — Telehealth: Payer: Self-pay

## 2019-09-05 LAB — LUTEINIZING HORMONE: LH: 27.5 m[IU]/mL — ABNORMAL HIGH (ref 1.7–8.6)

## 2019-09-05 LAB — TESTOSTERONE: Testosterone: 165 ng/dL — ABNORMAL LOW (ref 264–916)

## 2019-09-05 LAB — ESTRADIOL: Estradiol: 20.5 pg/mL (ref 7.6–42.6)

## 2019-09-05 NOTE — Telephone Encounter (Signed)
Pt calls and states that he saw his labs via mychart and was very concerned. Advised pt that he should give at minimum 24hrs for provider to review results.

## 2019-09-06 NOTE — Telephone Encounter (Signed)
Testosterone level low at 165.  Testosterone replacement may not improve the gynecomastia but would recommend a trial of testosterone replacement.  Needs routine virtual appointment to discuss TRT.

## 2019-09-06 NOTE — Telephone Encounter (Signed)
[facility] notifying patient as advised. Set up virtual appt for 09/12/19 @ 3:00 PM. Reminder mailed 5/7.

## 2019-09-11 NOTE — Progress Notes (Signed)
Virtual Visit via Telephone Note  I connected with Brandon Gibson on 09/12/19 at  3:00 PM EDT by telephone and verified that I am speaking with the correct person using two identifiers.  Location: Patient: home Provider: office   I discussed the limitations, risks, security and privacy concerns of performing an evaluation and management service by telephone and the availability of in person appointments. I also discussed with the patient that there may be a patient responsible charge related to this service. The patient expressed understanding and agreed to proceed.  History of Present Illness: Brandon Gibson is a 69 y.o. M who presents today for the evaluation and management of hypogonadism.    -Past hx of gynecomastia which regressed w/ change in medications -excision of right breast tissue in the past -left breast tenderness in addition to enlargement  -Mammogram on 08/2019 showed NED -testosterone level from 08/26/19 was 121, Estrogen level in upper level of normal at 91 pg/mL -Blood work 09/04/19: Testosterone 165, LH 27.5, estrogen 20.  Observations/Objective: Alert, conversive  Assessment and Plan:  1. Hypogonadism Discussed testosterone replacement including topical gel vs  Injections  He has elected topical gel treatment   He will check on his insurance regarding coverage and will call back  Potential side effects of testosterone replacement were discussed including stimulation of benign prostatic growth with lower urinary tract symptoms; erythrocytosis; edema; gynecomastia; worsening sleep apnea; venous thromboembolism; testicular atrophy and infertility. Recent studies suggesting an increased incidence of heart attack and stroke in patients taking testosterone was discussed. He was informed there is conflicting evidence regarding the impact of testosterone therapy on cardiovascular risk. The theoretical risk of growth stimulation of an undetected prostate cancer was  also discussed.  He was informed that current evidence does not provide any definitive answers regarding the risks of testosterone therapy on prostate cancer and cardiovascular disease. The need for periodic monitoring of his testosterone level, PSA, hematocrit and DRE was discussed.   Follow Up Instructions: F/u in 1 month after starting replacement therapy    I discussed the assessment and treatment plan with the patient. The patient was provided an opportunity to ask questions and all were answered. The patient agreed with the plan and demonstrated an understanding of the instructions.   The patient was advised to call back or seek an in-person evaluation if the symptoms worsen or if the condition fails to improve as anticipated.  I provided 15 minutes of non-face-to-face time during this encounter.  Jamas Lav, am acting as a scribe for Dr. Nicki Reaper C. Tegan Burnside,  I have reviewed the above documentation for accuracy and completeness, and I agree with the above.   Abbie Sons, MD

## 2019-09-12 ENCOUNTER — Other Ambulatory Visit: Payer: Self-pay

## 2019-09-12 ENCOUNTER — Encounter: Payer: Self-pay | Admitting: Urology

## 2019-09-12 ENCOUNTER — Telehealth (INDEPENDENT_AMBULATORY_CARE_PROVIDER_SITE_OTHER): Payer: Medicare HMO | Admitting: Urology

## 2019-09-12 DIAGNOSIS — E291 Testicular hypofunction: Secondary | ICD-10-CM

## 2019-09-14 ENCOUNTER — Encounter: Payer: Self-pay | Admitting: Urology

## 2019-09-14 MED ORDER — TESTOSTERONE 20.25 MG/ACT (1.62%) TD GEL: TRANSDERMAL | 1 refills | Status: DC

## 2019-09-17 ENCOUNTER — Telehealth: Payer: Self-pay | Admitting: Urology

## 2019-09-17 NOTE — Telephone Encounter (Signed)
Brandon Gibson spoke with patient today and he is going to wait until after he see's his PCP in a few weeks before starting the testosterone gel and will call the office back once he has started it to make his 1 month follow up app with the PA.   Sharyn Lull

## 2019-09-17 NOTE — Telephone Encounter (Signed)
-----   Message from Abbie Sons, MD sent at 09/14/2019  3:36 PM EDT ----- Needs a PA follow-up with testosterone level 1 month after starting testosterone gel

## 2020-01-29 DIAGNOSIS — M19071 Primary osteoarthritis, right ankle and foot: Secondary | ICD-10-CM | POA: Insufficient documentation

## 2020-01-29 DIAGNOSIS — M6701 Short Achilles tendon (acquired), right ankle: Secondary | ICD-10-CM | POA: Insufficient documentation

## 2020-01-29 DIAGNOSIS — M19079 Primary osteoarthritis, unspecified ankle and foot: Secondary | ICD-10-CM | POA: Insufficient documentation

## 2020-01-29 DIAGNOSIS — M76821 Posterior tibial tendinitis, right leg: Secondary | ICD-10-CM | POA: Insufficient documentation

## 2020-03-30 ENCOUNTER — Ambulatory Visit: Payer: Medicare HMO | Admitting: Urology

## 2020-05-21 ENCOUNTER — Other Ambulatory Visit: Payer: Medicare HMO

## 2020-05-21 DIAGNOSIS — Z20822 Contact with and (suspected) exposure to covid-19: Secondary | ICD-10-CM

## 2020-05-23 LAB — SARS-COV-2, NAA 2 DAY TAT

## 2020-05-23 LAB — NOVEL CORONAVIRUS, NAA: SARS-CoV-2, NAA: NOT DETECTED

## 2020-07-01 ENCOUNTER — Other Ambulatory Visit: Payer: Self-pay | Admitting: Urology

## 2020-07-01 DIAGNOSIS — N401 Enlarged prostate with lower urinary tract symptoms: Secondary | ICD-10-CM

## 2020-10-02 DIAGNOSIS — M7541 Impingement syndrome of right shoulder: Secondary | ICD-10-CM | POA: Diagnosis not present

## 2020-10-16 DIAGNOSIS — M7541 Impingement syndrome of right shoulder: Secondary | ICD-10-CM | POA: Diagnosis not present

## 2020-10-16 DIAGNOSIS — G8929 Other chronic pain: Secondary | ICD-10-CM | POA: Diagnosis not present

## 2020-10-16 DIAGNOSIS — M6281 Muscle weakness (generalized): Secondary | ICD-10-CM | POA: Diagnosis not present

## 2020-10-16 DIAGNOSIS — M25511 Pain in right shoulder: Secondary | ICD-10-CM | POA: Diagnosis not present

## 2020-10-16 DIAGNOSIS — M25611 Stiffness of right shoulder, not elsewhere classified: Secondary | ICD-10-CM | POA: Diagnosis not present

## 2020-10-20 ENCOUNTER — Inpatient Hospital Stay: Admission: RE | Admit: 2020-10-20 | Payer: Medicare HMO | Source: Ambulatory Visit

## 2020-10-20 ENCOUNTER — Inpatient Hospital Stay
Admission: RE | Admit: 2020-10-20 | Discharge: 2020-10-20 | Disposition: A | Discharge status: Home / Self Care | Payer: Medicare HMO | Source: Ambulatory Visit

## 2020-10-20 ENCOUNTER — Other Ambulatory Visit: Admission: RE | Admit: 2020-10-20 | Payer: Medicare HMO | Source: Ambulatory Visit

## 2020-10-20 DIAGNOSIS — L820 Inflamed seborrheic keratosis: Secondary | ICD-10-CM | POA: Diagnosis not present

## 2020-10-20 NOTE — Pre-Procedure Instructions (Signed)
Call from [facility]. Patient's surgery will be cancelled and rescheduled for a later date due to Dr. Hassell Done not being available this week.

## 2020-10-22 DIAGNOSIS — E119 Type 2 diabetes mellitus without complications: Secondary | ICD-10-CM | POA: Diagnosis not present

## 2020-10-22 DIAGNOSIS — Z96651 Presence of right artificial knee joint: Secondary | ICD-10-CM | POA: Diagnosis not present

## 2020-10-23 ENCOUNTER — Ambulatory Visit: Admission: RE | Admit: 2020-10-23 | Payer: Medicare HMO | Source: Home / Self Care | Admitting: Surgery

## 2020-10-23 ENCOUNTER — Encounter: Admission: RE | Payer: Self-pay | Source: Home / Self Care

## 2020-10-23 SURGERY — MASTECTOMY, FOR GYNECOMASTIA
Anesthesia: General | Site: Breast | Laterality: Left

## 2020-10-27 DIAGNOSIS — G4733 Obstructive sleep apnea (adult) (pediatric): Secondary | ICD-10-CM | POA: Diagnosis not present

## 2020-11-04 DIAGNOSIS — E039 Hypothyroidism, unspecified: Secondary | ICD-10-CM | POA: Diagnosis not present

## 2020-11-04 DIAGNOSIS — I10 Essential (primary) hypertension: Secondary | ICD-10-CM | POA: Diagnosis not present

## 2020-11-04 DIAGNOSIS — E782 Mixed hyperlipidemia: Secondary | ICD-10-CM | POA: Diagnosis not present

## 2020-11-04 DIAGNOSIS — E119 Type 2 diabetes mellitus without complications: Secondary | ICD-10-CM | POA: Diagnosis not present

## 2020-11-04 DIAGNOSIS — N410 Acute prostatitis: Secondary | ICD-10-CM | POA: Diagnosis not present

## 2020-11-05 DIAGNOSIS — E782 Mixed hyperlipidemia: Secondary | ICD-10-CM | POA: Diagnosis not present

## 2020-11-05 DIAGNOSIS — E119 Type 2 diabetes mellitus without complications: Secondary | ICD-10-CM | POA: Diagnosis not present

## 2020-11-05 DIAGNOSIS — G4733 Obstructive sleep apnea (adult) (pediatric): Secondary | ICD-10-CM | POA: Diagnosis not present

## 2020-11-05 DIAGNOSIS — I10 Essential (primary) hypertension: Secondary | ICD-10-CM | POA: Diagnosis not present

## 2020-11-05 DIAGNOSIS — R0602 Shortness of breath: Secondary | ICD-10-CM | POA: Diagnosis not present

## 2020-11-05 DIAGNOSIS — K219 Gastro-esophageal reflux disease without esophagitis: Secondary | ICD-10-CM | POA: Diagnosis not present

## 2020-11-05 DIAGNOSIS — I4891 Unspecified atrial fibrillation: Secondary | ICD-10-CM | POA: Diagnosis not present

## 2020-11-06 DIAGNOSIS — E119 Type 2 diabetes mellitus without complications: Secondary | ICD-10-CM | POA: Diagnosis not present

## 2020-11-26 DIAGNOSIS — G4733 Obstructive sleep apnea (adult) (pediatric): Secondary | ICD-10-CM | POA: Diagnosis not present

## 2020-12-11 DIAGNOSIS — I509 Heart failure, unspecified: Secondary | ICD-10-CM | POA: Diagnosis not present

## 2020-12-11 DIAGNOSIS — I739 Peripheral vascular disease, unspecified: Secondary | ICD-10-CM | POA: Diagnosis not present

## 2020-12-11 DIAGNOSIS — I4819 Other persistent atrial fibrillation: Secondary | ICD-10-CM | POA: Diagnosis not present

## 2020-12-11 DIAGNOSIS — I110 Hypertensive heart disease with heart failure: Secondary | ICD-10-CM | POA: Diagnosis not present

## 2020-12-11 DIAGNOSIS — E785 Hyperlipidemia, unspecified: Secondary | ICD-10-CM | POA: Diagnosis not present

## 2020-12-11 DIAGNOSIS — D6869 Other thrombophilia: Secondary | ICD-10-CM | POA: Diagnosis not present

## 2020-12-11 DIAGNOSIS — G4733 Obstructive sleep apnea (adult) (pediatric): Secondary | ICD-10-CM | POA: Diagnosis not present

## 2020-12-11 DIAGNOSIS — Z7901 Long term (current) use of anticoagulants: Secondary | ICD-10-CM | POA: Diagnosis not present

## 2020-12-11 DIAGNOSIS — E261 Secondary hyperaldosteronism: Secondary | ICD-10-CM | POA: Diagnosis not present

## 2020-12-11 DIAGNOSIS — Z6835 Body mass index (BMI) 35.0-35.9, adult: Secondary | ICD-10-CM | POA: Diagnosis not present

## 2020-12-21 ENCOUNTER — Ambulatory Visit: Payer: Medicare HMO | Admitting: Urology

## 2020-12-21 ENCOUNTER — Other Ambulatory Visit: Payer: Self-pay

## 2020-12-21 VITALS — BP 168/93 | HR 58 | Ht 70.0 in | Wt 245.0 lb

## 2020-12-21 DIAGNOSIS — R399 Unspecified symptoms and signs involving the genitourinary system: Secondary | ICD-10-CM

## 2020-12-21 DIAGNOSIS — N410 Acute prostatitis: Secondary | ICD-10-CM | POA: Diagnosis not present

## 2020-12-21 DIAGNOSIS — N3001 Acute cystitis with hematuria: Secondary | ICD-10-CM | POA: Diagnosis not present

## 2020-12-21 DIAGNOSIS — N390 Urinary tract infection, site not specified: Secondary | ICD-10-CM | POA: Diagnosis not present

## 2020-12-21 LAB — URINALYSIS, COMPLETE
Bilirubin, UA: NEGATIVE
Glucose, UA: NEGATIVE
Ketones, UA: NEGATIVE
Nitrite, UA: NEGATIVE
Specific Gravity, UA: >1.03 — ABNORMAL HIGH (ref 1.005–1.030)
Urobilinogen, Ur: 0.2 mg/dL (ref 0.2–1.0)
pH, UA: 6 (ref 5.0–7.5)

## 2020-12-21 LAB — MICROSCOPIC EXAMINATION
Bacteria, UA: NONE SEEN
RBC, Urine: >30 /hpf — AB (ref 0–2)
WBC, UA: >30 /hpf — AB (ref 0–5)

## 2020-12-21 MED ORDER — CIPROFLOXACIN HCL 500 MG PO TABS
500.0000 mg | ORAL_TABLET | Freq: Two times a day (BID) | ORAL | 0 refills | Status: AC
Start: 2020-12-21 — End: 2021-01-11

## 2020-12-21 NOTE — Progress Notes (Signed)
12/21/2020 1:26 PM   [place] 28-Nov-1950 [id number]  Referring provider: Perrin Maltese, MD [place],  [place] [address]  No chief complaint on file.   HPI: 2019 I was consulted to assess the patient's voiding dysfunction worse over a few years. He reports a poor flow with hesitancy. Sometimes he'll lean forward and strain to help urination. He does not stop and start. He can feel empty but then double void a small amount or void one hour later.    He voids every 2 hours during the day and once or twice at night. He reports decreased libido. His erections are less firm   The patient has decreased flow with milder frequency and nocturia.    I gave him a Flomax prescription and hopefully he will not have side effects. We will discuss testing and quality of life next time if he fails. I gave him Viagra 100 mg samples and he will try it.    By history he tried Cialis once a day and it may have helped his erections a bit but did not help his voiding dysfunction. When I saw the patient last time the stream was a bit better on Flomax. He had not tried Viagra since he was not sexually active. We talked about urodynamics and cystoscopy and I increased a Flomax of 0.8 mg.  When I saw him in June the flow was improved on one Flomax and he was getting a bit dizzy on the higher dose.  He was having some heart valve issues and we did not pursue other potential invasive therapies for prostate symptoms.  We gave him generic Viagra.   He takes one Flomax a day.  He does not want to pursue more invasive therapies.  Cardiac issues have settled down.  He failed the branded Viagra and did not take the generic.  In the last 3 or 4 months sexual function has improved with a new partner.  He does not want to pursue other options   Today Patient saw Dr. Bernardo Heater a few times since I saw him.  He was having breast tenderness.  A testosterone level from April 2021 was 121.  A repeat was 165.   LH level was 27.5 and estradiol level was normal-it appears on May 2021 he was given topical testosterone.  Patient came in today with a 10-day history of burning.  He has small-volume frequency.  Symptoms were worse yesterday.  He has a past history of prostatitis.  He may have had 1 or 2 infections years ago.  No history of kidney stone and renal ultrasound from 2019 was normal.  He is on Flomax once a day and voiding every 2 hours at night at baseline       PMH: Past Medical History:  Diagnosis Date   Arthritis    Atrial fibrillation (HCC)    BPH (benign prostatic hyperplasia)    Cancer (HCC)    HX SKIN CANCER Basal cell   Chickenpox    Clotting disorder (HCC)    Diabetes mellitus without complication (HCC)    type 2   Dysrhythmia    IRREG HEART BEAT   GERD (gastroesophageal reflux disease)    H/O pleurisy    Hypercholesteremia    Hyperlipidemia    Hypertension    Iron deficiency anemia due to chronic blood loss 09/18/2017   Lumbar stenosis    Measles    Mumps    Sleep apnea    sleep  study Dr. Chancy Milroy, uses CPAP    Surgical History: Past Surgical History:  Procedure Laterality Date   ANTERIOR CERVICAL DECOMP/DISCECTOMY FUSION  11/25/2011   Procedure: ANTERIOR CERVICAL DECOMPRESSION/DISCECTOMY FUSION 2 LEVELS;  Surgeon: Floyce Stakes, MD;  Location: [facility];  Service: Neurosurgery;  Laterality: N/A;  Cervical four-five,Cervical five-six  Anterior cervical decompression/diskectomy, fusion, plate   BREAST BIOPSY Right    Benign   BREAST SURGERY Left 1986   lumpectomy   CARDIAC CATHETERIZATION     2011, [place] TEST  2011   [place]   C 6/7    COLONOSCOPY WITH PROPOFOL N/A 11/08/2017   Procedure: COLONOSCOPY WITH PROPOFOL;  Surgeon: Toledo, Benay Pike, MD;  Location: [facility];  Service: Gastroenterology;  Laterality: N/A;   ESOPHAGOGASTRODUODENOSCOPY (EGD) WITH PROPOFOL N/A 11/08/2017   Procedure:  ESOPHAGOGASTRODUODENOSCOPY (EGD) WITH PROPOFOL;  Surgeon: Toledo, Benay Pike, MD;  Location: [facility];  Service: Gastroenterology;  Laterality: N/A;   EYE SURGERY     LASIK   JOINT REPLACEMENT     KNEE ARTHROPLASTY Right 10/05/2015   Procedure: COMPUTER ASSISTED TOTAL KNEE ARTHROPLASTY;  Surgeon: Dereck Leep, MD;  Location: [facility];  Service: Orthopedics;  Laterality: Right;   KNEE ARTHROSCOPY  1986   Right   KNEE ARTHROSCOPY Left 08/10/2015   Procedure: LEFT KNEE ARTHROSCOPY, CHONDROPLASTY, MEDIAL MENISECTOMY;  Surgeon: Dereck Leep, MD;  Location: [facility];  Service: Orthopedics;  Laterality: Left;   LAMINECTOMY WITH POSTERIOR LATERAL ARTHRODESIS LEVEL 2 N/A 07/16/2018   Procedure: Posterior lumbar fusion with instrumentation at L3-4 with repeat facetectomy L3-4;  Surgeon: Eustace Moore, MD;  Location: [facility];  Service: Neurosurgery;  Laterality: N/A;  Posterior lumbar fusion with instrumentation at L3-4 with repeat facetectomy L3-4   LUMBAR LAMINECTOMY/DECOMPRESSION MICRODISCECTOMY N/A 10/04/2013   Procedure: LUMBAR TWO TO THREE LUMBAR LAMINECTOMY/DECOMPRESSION MICRODISCECTOMY 1 LEVEL;  Surgeon: Floyce Stakes, MD;  Location: [facility];  Service: Neurosurgery;  Laterality: N/A;  L2-3 Laminectomy   POSTERIOR LAMINECTOMY / DECOMPRESSION LUMBAR SPINE  2006   TRANSESOPHAGEAL ECHOCARDIOGRAM  2011    Home Medications:  Allergies as of 12/21/2020       Reactions   Morphine And Related Anaphylaxis   Ace Inhibitors Swelling        Medication List        Accurate as of December 21, 2020  1:26 PM. If you have any questions, ask your nurse or doctor.          Accu-Chek Aviva Plus test strip Generic drug: glucose blood   amiodarone 200 MG tablet Commonly known as: PACERONE Take 200 mg by mouth at bedtime.   b complex vitamins tablet Take 1 tablet by mouth daily.   chlorthalidone 25 MG tablet Commonly known as: HYGROTON   EpiPen 2-Pak 0.3 mg/0.3 mL Soaj  injection Generic drug: EPINEPHrine Inject 0.3 mg into the muscle as needed for anaphylaxis.   Fish Oil 1000 MG Caps Take 1,000 mg by mouth 2 (two) times daily.   furosemide 20 MG tablet Commonly known as: LASIX Take 20 mg by mouth daily as needed for fluid.   hydrALAZINE 50 MG tablet Commonly known as: APRESOLINE Take 50 mg by mouth daily.   hydrochlorothiazide 25 MG tablet Commonly known as: HYDRODIURIL Take 25 mg by mouth every morning.   Iron-Vitamin C 65-125 MG Tabs Take 1 tablet by mouth 2 (two) times daily.   metFORMIN 500 MG tablet Commonly known as: GLUCOPHAGE Take  500 mg by mouth 2 (two) times daily with a meal.   metoprolol succinate 25 MG 24 hr tablet Commonly known as: TOPROL-XL Take 25 mg by mouth every morning.   omeprazole 40 MG capsule Commonly known as: PRILOSEC Take 40 mg by mouth every morning.   potassium chloride 10 MEQ tablet Commonly known as: KLOR-CON Take 10 mEq by mouth at bedtime.   rivaroxaban 20 MG Tabs tablet Commonly known as: XARELTO Take 20 mg by mouth at bedtime.   rosuvastatin 20 MG tablet Commonly known as: CRESTOR Take 20 mg by mouth at bedtime.   tamsulosin 0.4 MG Caps capsule Commonly known as: FLOMAX TAKE 2 CAPSULES DAILY.   Testosterone 20.25 MG/ACT (1.62%) Gel Apply 1 pump to each shoulder daily   Vitamin D (Ergocalciferol) 1.25 MG (50000 UNIT) Caps capsule Commonly known as: DRISDOL Take 50,000 Units by mouth every 7 (seven) days.        Allergies:  Allergies  Allergen Reactions   Morphine And Related Anaphylaxis   Ace Inhibitors Swelling    Family History: Family History  Problem Relation Age of Onset   Leukemia Father    Diabetes Father    Heart disease Mother    Prostate cancer Neg Hx    Chronic Renal Failure Neg Hx    Breast cancer Neg Hx     Social History:  reports that he has never smoked. He has never used smokeless tobacco. He reports current alcohol use of about 14.0 standard drinks  per week. He reports that he does not use drugs.  ROS:                                        Physical Exam: There were no vitals taken for this visit.  Constitutional:  Alert and oriented, No acute distress. HEENT: [place] AT, moist mucus membranes.  Trachea midline, no masses.   Laboratory Data: Lab Results  Component Value Date   WBC 4.2 08/12/2019   HGB 15.2 08/12/2019   HCT 43.2 08/12/2019   MCV 89.1 08/12/2019   PLT 155 08/12/2019    Lab Results  Component Value Date   CREATININE 1.14 08/12/2019    No results found for: PSA  Lab Results  Component Value Date   TESTOSTERONE 165 (L) 09/04/2019    Lab Results  Component Value Date   HGBA1C 5.3 09/23/2015    Urinalysis    Component Value Date/Time   COLORURINE YELLOW (A) 09/23/2015 0824   APPEARANCEUR Clear 03/22/2019 0905   LABSPEC 1.017 09/23/2015 0824   PHURINE 6.0 09/23/2015 0824   GLUCOSEU Negative 03/22/2019 0905   HGBUR NEGATIVE 09/23/2015 0824   BILIRUBINUR Negative 03/22/2019 [place] 09/23/2015 0824   PROTEINUR Negative 03/22/2019 0905   PROTEINUR NEGATIVE 09/23/2015 0824   NITRITE Negative 03/22/2019 0905   NITRITE NEGATIVE 09/23/2015 0824   LEUKOCYTESUR Negative 03/22/2019 0905    Pertinent Imaging: Red blood cells in urine.  Took 1 antibiotic this morning.  It was ciprofloxacin.  Urine sent for culture.    Assessment & Plan: Patient will be treated as a bladder infection.  Recognizing he has had prostatitis before I thought it was reasonable to give him a 4-week course of antibiotics  I will have him see the nurse practitioner to make certain that his urine symptoms is settled down and he no longer has any blood in the urine.  Otherwise this would need a work-up.  We may or may not be able to improve his nighttime frequency.   It turns out he is going to [place] for 2 months on the 31st.  I will have a nurse practitioner see him in 1 week just to  check on him.  I gave him 3 more weeks to ciprofloxacin 500 mg twice a day.  He just got a prescription for 1 week by his primary care doctor this morning  Culture may be negative.  I did not give him Mobic He does take blood thinner and has never smoked If he still having some burning next week Mobic could be given  There are no diagnoses linked to this encounter.  No follow-ups on file.  Reece Packer, MD  [facility] [address], [place], [place] [address] [phone]

## 2020-12-24 LAB — CULTURE, URINE COMPREHENSIVE

## 2020-12-27 DIAGNOSIS — G4733 Obstructive sleep apnea (adult) (pediatric): Secondary | ICD-10-CM | POA: Diagnosis not present

## 2020-12-28 ENCOUNTER — Encounter: Payer: Self-pay | Admitting: Physician Assistant

## 2020-12-28 ENCOUNTER — Ambulatory Visit: Payer: Medicare HMO | Admitting: Physician Assistant

## 2020-12-28 ENCOUNTER — Other Ambulatory Visit: Payer: Self-pay

## 2020-12-28 VITALS — BP 179/91 | HR 60 | Ht 70.0 in | Wt 243.0 lb

## 2020-12-28 DIAGNOSIS — N411 Chronic prostatitis: Secondary | ICD-10-CM

## 2020-12-28 DIAGNOSIS — N3001 Acute cystitis with hematuria: Secondary | ICD-10-CM | POA: Diagnosis not present

## 2020-12-28 LAB — URINALYSIS, COMPLETE
Bilirubin, UA: NEGATIVE
Glucose, UA: NEGATIVE
Leukocytes,UA: NEGATIVE
Nitrite, UA: NEGATIVE
Specific Gravity, UA: >1.03 — ABNORMAL HIGH (ref 1.005–1.030)
Urobilinogen, Ur: 0.2 mg/dL (ref 0.2–1.0)
pH, UA: 5.5 (ref 5.0–7.5)

## 2020-12-28 LAB — BLADDER SCAN AMB NON-IMAGING

## 2020-12-28 LAB — MICROSCOPIC EXAMINATION
Bacteria, UA: NONE SEEN
RBC, Urine: >30 /hpf — AB (ref 0–2)

## 2020-12-28 NOTE — Patient Instructions (Signed)
Complete your ciprofloxacin antibiotic. If your pain returns while you are out of the country, please seek medical care there. Otherwise we will see you back in November. Safe travels!

## 2020-12-28 NOTE — Progress Notes (Signed)
12/28/2020 11:21 AM   Brandon Gibson [birth year] [id number]  CC: Chief Complaint  Patient presents with   Hematuria   Follow-up   HPI: Brandon Gibson is a 70 y.o. male with PMH OSA on CPAP, DM2, A. fib on Xarelto, prostatitis, and hematuria with benign work-up in 2019 who presents today for symptom and urine recheck after 1 week of Cipro for empiric treatment of recurrent prostatitis per Dr. Matilde Sprang.   Today he reports his dysuria and gross hematuria are "1000% better" on Cipro.  He reports some persistent occasional twinges of dysuria and states his gross hematuria resolved 5 days ago.  He denies flank pain, fever, chills, nausea, or vomiting.  As previously noted, he will be traveling to [place] in 2 days and will return to the [place] on 03/03/2021.  He does not have any established medical providers in [place], but states he understands to proceed to the emergency department for recurrent/persistent symptoms.  Urine culture from his most recent visit with Dr. Matilde Sprang last week has finalized with no growth.  In-office UA today positive for trace ketones, 2+ blood, and trace protein; urine microscopy with 6-10 WBCs/HPF and >30 RBCs/HPF. PVR 70m.  PMH: Past Medical History:  Diagnosis Date   Arthritis    Atrial fibrillation (HCC)    BPH (benign prostatic hyperplasia)    Cancer (HCC)    HX SKIN CANCER Basal cell   Chickenpox    Clotting disorder (HCC)    Diabetes mellitus without complication (HCC)    type 2   Dysrhythmia    IRREG HEART BEAT   GERD (gastroesophageal reflux disease)    H/O pleurisy    Hypercholesteremia    Hyperlipidemia    Hypertension    Iron deficiency anemia due to chronic blood loss 09/18/2017   Lumbar stenosis    Measles    Mumps    Sleep apnea    sleep study Dr. KChancy Milroy uses CPAP    Surgical History: Past Surgical History:  Procedure Laterality Date   ANTERIOR CERVICAL DECOMP/DISCECTOMY FUSION  11/25/2011   Procedure:  ANTERIOR CERVICAL DECOMPRESSION/DISCECTOMY FUSION 2 LEVELS;  Surgeon: EFloyce Stakes MD;  [facility]: [facility];  Service: Neurosurgery;  Laterality: N/A;  Cervical four-five,Cervical five-six  Anterior cervical decompression/diskectomy, fusion, plate   BREAST BIOPSY Right    Benign   BREAST SURGERY Left 1986   lumpectomy   [place]     [place]  C 6/7    COLONOSCOPY WITH PROPOFOL N/A 11/08/2017   Procedure: COLONOSCOPY WITH PROPOFOL;  Surgeon: Toledo, TBenay Pike MD;  Location: [facility];  Service: Gastroenterology;  Laterality: N/A;   ESOPHAGOGASTRODUODENOSCOPY (EGD) WITH PROPOFOL N/A 11/08/2017   Procedure: ESOPHAGOGASTRODUODENOSCOPY (EGD) WITH PROPOFOL;  Surgeon: Toledo, TBenay Pike MD;  Location: [facility];  Service: Gastroenterology;  Laterality: N/A;   EYE SURGERY     LASIK   JOINT REPLACEMENT     KNEE ARTHROPLASTY Right 10/05/2015   Procedure: COMPUTER ASSISTED TOTAL KNEE ARTHROPLASTY;  Surgeon: JDereck Leep MD;  Location: [facility];  Service: Orthopedics;  Laterality: Right;   KNEE ARTHROSCOPY  1986   Right   KNEE ARTHROSCOPY Left 08/10/2015   Procedure: LEFT KNEE ARTHROSCOPY, CHONDROPLASTY, MEDIAL MENISECTOMY;  Surgeon: JDereck Leep MD;  Location: [facility];  Service: Orthopedics;  Laterality: Left;   LAMINECTOMY WITH POSTERIOR LATERAL ARTHRODESIS LEVEL 2 N/A 07/16/2018   Procedure:  Posterior lumbar fusion with instrumentation at L3-4 with repeat facetectomy L3-4;  Surgeon: Eustace Moore, MD;  Location: [place] [facility];  Service: Neurosurgery;  Laterality: N/A;  Posterior lumbar fusion with instrumentation at L3-4 with repeat facetectomy L3-4   LUMBAR LAMINECTOMY/DECOMPRESSION MICRODISCECTOMY N/A 10/04/2013   Procedure: LUMBAR TWO TO THREE LUMBAR LAMINECTOMY/DECOMPRESSION MICRODISCECTOMY 1 LEVEL;  Surgeon: Floyce Stakes, MD;  Location: [facility] [facility];  Service: Neurosurgery;  Laterality: N/A;   L2-3 Laminectomy   POSTERIOR LAMINECTOMY / DECOMPRESSION LUMBAR SPINE  2006   TRANSESOPHAGEAL ECHOCARDIOGRAM  2011    Home Medications:  Allergies as of 12/28/2020       Reactions   Morphine And Related Anaphylaxis   Ace Inhibitors Swelling   Other reaction(s): Unknown   Diazepam         Medication List        Accurate as of December 28, 2020 11:21 AM. If you have any questions, ask your nurse or doctor.          Accu-Chek Aviva Plus test strip Generic drug: glucose blood   amiodarone 200 MG tablet Commonly known as: PACERONE Take 200 mg by mouth at bedtime.   b complex vitamins tablet Take 1 tablet by mouth daily.   chlorthalidone 25 MG tablet Commonly known as: HYGROTON   ciprofloxacin 500 MG tablet Commonly known as: CIPRO Take 500 mg by mouth 2 (two) times daily.   ciprofloxacin 500 MG tablet Commonly known as: CIPRO Take 1 tablet (500 mg total) by mouth every 12 (twelve) hours for 21 days.   EPINEPHrine 0.3 mg/0.3 mL Soaj injection Commonly known as: EPI-PEN Inject 0.3 mg into the muscle as needed for anaphylaxis.   Fish Oil 1000 MG Caps Take 1,000 mg by mouth 2 (two) times daily.   hydrALAZINE 50 MG tablet Commonly known as: APRESOLINE Take 50 mg by mouth daily.   Iron-Vitamin C 65-125 MG Tabs Take 1 tablet by mouth 2 (two) times daily.   metFORMIN 500 MG tablet Commonly known as: GLUCOPHAGE Take 500 mg by mouth 2 (two) times daily with a meal.   metoprolol succinate 25 MG 24 hr tablet Commonly known as: TOPROL-XL Take 25 mg by mouth every morning.   omeprazole 40 MG capsule Commonly known as: PRILOSEC Take 40 mg by mouth every morning.   potassium chloride 10 MEQ tablet Commonly known as: KLOR-CON Take 10 mEq by mouth at bedtime.   rivaroxaban 20 MG Tabs tablet Commonly known as: XARELTO Take 20 mg by mouth at bedtime.   rosuvastatin 20 MG tablet Commonly known as: CRESTOR Take 20 mg by mouth at bedtime.   tamsulosin 0.4 MG  Caps capsule Commonly known as: FLOMAX TAKE 2 CAPSULES DAILY.   Vitamin D (Ergocalciferol) 1.25 MG (50000 UNIT) Caps capsule Commonly known as: DRISDOL Take 50,000 Units by mouth every 7 (seven) days.        Allergies:  Allergies  Allergen Reactions   Morphine And Related Anaphylaxis   Ace Inhibitors Swelling    Other reaction(s): Unknown   Diazepam     Family History: Family History  Problem Relation Age of Onset   Leukemia Father    Diabetes Father    Heart disease Mother    Prostate cancer Neg Hx    Chronic Renal Failure Neg Hx    Breast cancer Neg Hx     Social History:   reports that he has never smoked. He has never used smokeless tobacco. He reports current alcohol use of about  14.0 standard drinks per week. He reports that he does not use drugs.  Physical Exam: BP (!) 179/91   Pulse 60   Ht '5\' 10"'$  (1.778 m)   Wt 243 lb (110.2 kg)   BMI 34.87 kg/m   Constitutional:  Alert and oriented, no acute distress, nontoxic appearing HEENT: [place], AT Cardiovascular: No clubbing, cyanosis, or edema Respiratory: Normal respiratory effort, no increased work of breathing Skin: No rashes, bruises or suspicious lesions Neurologic: Grossly intact, no focal deficits, moving all 4 extremities Psychiatric: Normal mood and affect  Laboratory Data: Results for orders placed or performed in visit on 12/28/20  Microscopic Examination   Urine  Result Value Ref Range   WBC, UA 6-10 (A) 0 - 5 /hpf   RBC >30 (A) 0 - 2 /hpf   Epithelial Cells (non renal) 0-10 0 - 10 /hpf   Casts Present (A) None seen /lpf   Cast Type Hyaline casts N/A   Bacteria, UA None seen None seen/Few  Urinalysis, Complete  Result Value Ref Range   Specific Gravity, UA >1.030 (H) 1.005 - 1.030   pH, UA 5.5 5.0 - 7.5   Color, UA Yellow Yellow   Appearance Ur Cloudy (A) Clear   Leukocytes,UA Negative Negative   Protein,UA Trace (A) Negative/Trace   Glucose, UA Negative Negative   Ketones, UA Trace (A)  Negative   RBC, UA 2+ (A) Negative   Bilirubin, UA Negative Negative   Urobilinogen, Ur 0.2 0.2 - 1.0 mg/dL   Nitrite, UA Negative Negative   Microscopic Examination See below:   BLADDER SCAN AMB NON-IMAGING  Result Value Ref Range   Scan Result 70m    Assessment & Plan:   1. Chronic prostatitis Symptoms significantly improved after 1 week of empiric Cipro.  He does have some persistent microscopic hematuria, but his gross hematuria has resolved.  Counseled patient to complete his prescribed 4-week course of Cipro and will plan for symptom recheck and repeat UA upon [place] to [place]  If he continues to have hematuria at that time, may consider hematuria work-up.  Encourage patient to seek care while out of the country if his symptoms persist or worsen and he expressed understanding. - Urinalysis, Complete - BLADDER SCAN AMB NON-IMAGING  Return in about 10 weeks (around 03/08/2021) for Repeat UA and symptom recheck with Dr. MMatilde Sprang  SDebroah Loop PA-C  [facility] [address] [place] [phone]

## 2021-01-27 DIAGNOSIS — G4733 Obstructive sleep apnea (adult) (pediatric): Secondary | ICD-10-CM | POA: Diagnosis not present

## 2021-02-26 DIAGNOSIS — G4733 Obstructive sleep apnea (adult) (pediatric): Secondary | ICD-10-CM | POA: Diagnosis not present

## 2021-03-08 ENCOUNTER — Ambulatory Visit: Payer: Medicare HMO | Admitting: Urology

## 2021-03-08 ENCOUNTER — Other Ambulatory Visit: Payer: Self-pay

## 2021-03-08 ENCOUNTER — Encounter: Payer: Self-pay | Admitting: Urology

## 2021-03-08 VITALS — BP 128/74 | HR 80 | Ht 70.0 in | Wt 245.0 lb

## 2021-03-08 DIAGNOSIS — R399 Unspecified symptoms and signs involving the genitourinary system: Secondary | ICD-10-CM | POA: Diagnosis not present

## 2021-03-08 DIAGNOSIS — N411 Chronic prostatitis: Secondary | ICD-10-CM

## 2021-03-08 LAB — URINALYSIS, COMPLETE
Bilirubin, UA: NEGATIVE
Glucose, UA: NEGATIVE
Nitrite, UA: NEGATIVE
Specific Gravity, UA: 1.02 (ref 1.005–1.030)
Urobilinogen, Ur: 1 mg/dL (ref 0.2–1.0)
pH, UA: 7 (ref 5.0–7.5)

## 2021-03-08 LAB — MICROSCOPIC EXAMINATION
RBC, Urine: >30 /hpf — AB (ref 0–2)
WBC, UA: >30 /hpf — AB (ref 0–5)

## 2021-03-08 MED ORDER — CIPROFLOXACIN HCL 500 MG PO TABS: 500.0000 mg | ORAL_TABLET | Freq: Two times a day (BID) | ORAL | 0 refills | Status: AC

## 2021-03-08 MED ORDER — CIPROFLOXACIN HCL 500 MG PO TABS: 500.0000 mg | ORAL_TABLET | Freq: Two times a day (BID) | ORAL | 0 refills | Status: DC

## 2021-03-08 MED ORDER — TRIMETHOPRIM 100 MG PO TABS: 100.0000 mg | ORAL_TABLET | Freq: Every day | ORAL | 11 refills | Status: DC

## 2021-03-08 NOTE — Patient Instructions (Signed)
Please take Cipro 500mg  twice a day for 30 days. After finishing Cipro take Trimethoprim 100mg  daily.

## 2021-03-08 NOTE — Addendum Note (Signed)
Addended by: Alvera Novel on: 03/08/2021 01:12 PM   Modules accepted: Orders

## 2021-03-08 NOTE — Progress Notes (Signed)
03/08/2021 12:51 PM   Brandon Gibson April 13, 1951 [id number]  Referring provider: Perrin Maltese, MD [name],  [place] [address]  Chief Complaint  Patient presents with   Urinary Frequency    HPI: Reviewed chart.  Patient followed by Dr. Bernardo Heater as well.  On Flomax.  Voids infrequently during the day.  I saw him in August 2022.  He was given ciprofloxacin for a month.  Did not give him Mobic.  Last culture negative.  He was dramatically better on the Cipro.  He had a normal renal ultrasound in 2019.  Patient immediately got better last time we treated him.  On Wednesday [place] and started having burning with a little bit of terminal blood in the urine.  No fever.   PMH: Past Medical History:  Diagnosis Date   Arthritis    Atrial fibrillation (HCC)    BPH (benign prostatic hyperplasia)    Cancer (HCC)    HX SKIN CANCER Basal cell   Chickenpox    Clotting disorder (HCC)    Diabetes mellitus without complication (HCC)    type 2   Dysrhythmia    IRREG HEART BEAT   GERD (gastroesophageal reflux disease)    H/O pleurisy    Hypercholesteremia    Hyperlipidemia    Hypertension    Iron deficiency anemia due to chronic blood loss 09/18/2017   Lumbar stenosis    Measles    Mumps    Sleep apnea    sleep study Dr. Chancy Milroy, uses CPAP    Surgical History: Past Surgical History:  Procedure Laterality Date   ANTERIOR CERVICAL DECOMP/DISCECTOMY FUSION  11/25/2011   Procedure: ANTERIOR CERVICAL DECOMPRESSION/DISCECTOMY FUSION 2 LEVELS;  Surgeon: Floyce Stakes, MD;  [facility];  Service: Neurosurgery;  Laterality: N/A;  Cervical four-five,Cervical five-six  Anterior cervical decompression/diskectomy, fusion, plate   BREAST BIOPSY Right    Benign   BREAST SURGERY Left 1986   lumpectomy   CARDIAC CATHETERIZATION     2011, [place] TEST  2011   [place]   C 6/7    COLONOSCOPY WITH PROPOFOL N/A  11/08/2017   Procedure: COLONOSCOPY WITH PROPOFOL;  Surgeon: Toledo, Benay Pike, MD;  Location: [facility];  Service: Gastroenterology;  Laterality: N/A;   ESOPHAGOGASTRODUODENOSCOPY (EGD) WITH PROPOFOL N/A 11/08/2017   Procedure: ESOPHAGOGASTRODUODENOSCOPY (EGD) WITH PROPOFOL;  Surgeon: Toledo, Benay Pike, MD;  Location: [facility];  Service: Gastroenterology;  Laterality: N/A;   EYE SURGERY     LASIK   JOINT REPLACEMENT     KNEE ARTHROPLASTY Right 10/05/2015   Procedure: COMPUTER ASSISTED TOTAL KNEE ARTHROPLASTY;  Surgeon: Dereck Leep, MD;  Location: [facility];  Service: Orthopedics;  Laterality: Right;   KNEE ARTHROSCOPY  1986   Right   KNEE ARTHROSCOPY Left 08/10/2015   Procedure: LEFT KNEE ARTHROSCOPY, CHONDROPLASTY, MEDIAL MENISECTOMY;  Surgeon: Dereck Leep, MD;  Location: [facility];  Service: Orthopedics;  Laterality: Left;   LAMINECTOMY WITH POSTERIOR LATERAL ARTHRODESIS LEVEL 2 N/A 07/16/2018   Procedure: Posterior lumbar fusion with instrumentation at L3-4 with repeat facetectomy L3-4;  Surgeon: Eustace Moore, MD;  Location: [place];  Service: Neurosurgery;  Laterality: N/A;  Posterior lumbar fusion with instrumentation at L3-4 with repeat facetectomy L3-4   LUMBAR LAMINECTOMY/DECOMPRESSION MICRODISCECTOMY N/A 10/04/2013   Procedure: LUMBAR TWO TO THREE LUMBAR LAMINECTOMY/DECOMPRESSION MICRODISCECTOMY 1 LEVEL;  Surgeon: Floyce Stakes, MD;  [facility];  Service: Neurosurgery;  Laterality: N/A;  L2-3 Laminectomy   POSTERIOR LAMINECTOMY / DECOMPRESSION LUMBAR SPINE  2006   TRANSESOPHAGEAL ECHOCARDIOGRAM  2011    Home Medications:  Allergies as of 03/08/2021       Reactions   Morphine And Related Anaphylaxis   Ace Inhibitors Swelling   Other reaction(s): Unknown   Diazepam         Medication List        Accurate as of March 08, 2021 12:51 PM. If you have any questions, ask your nurse or doctor.          Accu-Chek Aviva Plus test strip Generic  drug: glucose blood   amiodarone 200 MG tablet Commonly known as: PACERONE Take 200 mg by mouth at bedtime.   b complex vitamins tablet Take 1 tablet by mouth daily.   chlorthalidone 25 MG tablet Commonly known as: HYGROTON   ciprofloxacin 500 MG tablet Commonly known as: CIPRO Take 500 mg by mouth 2 (two) times daily.   EPINEPHrine 0.3 mg/0.3 mL Soaj injection Commonly known as: EPI-PEN Inject 0.3 mg into the muscle as needed for anaphylaxis.   Fish Oil 1000 MG Caps Take 1,000 mg by mouth 2 (two) times daily.   hydrALAZINE 50 MG tablet Commonly known as: APRESOLINE Take 50 mg by mouth daily.   Iron-Vitamin C 65-125 MG Tabs Take 1 tablet by mouth 2 (two) times daily.   metFORMIN 500 MG tablet Commonly known as: GLUCOPHAGE Take 500 mg by mouth 2 (two) times daily with a meal.   metoprolol succinate 25 MG 24 hr tablet Commonly known as: TOPROL-XL Take 25 mg by mouth every morning.   omeprazole 40 MG capsule Commonly known as: PRILOSEC Take 40 mg by mouth every morning.   potassium chloride 10 MEQ tablet Commonly known as: KLOR-CON Take 10 mEq by mouth at bedtime.   rivaroxaban 20 MG Tabs tablet Commonly known as: XARELTO Take 20 mg by mouth at bedtime.   rosuvastatin 20 MG tablet Commonly known as: CRESTOR Take 20 mg by mouth at bedtime.   tamsulosin 0.4 MG Caps capsule Commonly known as: FLOMAX TAKE 2 CAPSULES DAILY.   Vitamin D (Ergocalciferol) 1.25 MG (50000 UNIT) Caps capsule Commonly known as: DRISDOL Take 50,000 Units by mouth every 7 (seven) days.        Allergies:  Allergies  Allergen Reactions   Morphine And Related Anaphylaxis   Ace Inhibitors Swelling    Other reaction(s): Unknown   Diazepam     Family History: Family History  Problem Relation Age of Onset   Leukemia Father    Diabetes Father    Heart disease Mother    Prostate cancer Neg Hx    Chronic Renal Failure Neg Hx    Breast cancer Neg Hx     Social History:   reports that he has never smoked. He has never used smokeless tobacco. He reports current alcohol use of about 14.0 standard drinks per week. He reports that he does not use drugs.  ROS:                                        Physical Exam: There were no vitals taken for this visit.  Constitutional:  Alert and oriented, No acute distress.  Laboratory Data: Lab Results  Component Value Date   WBC 4.2 08/12/2019   HGB 15.2 08/12/2019   HCT 43.2  08/12/2019   MCV 89.1 08/12/2019   PLT 155 08/12/2019    Lab Results  Component Value Date   CREATININE 1.14 08/12/2019    No results found for: PSA  Lab Results  Component Value Date   TESTOSTERONE 165 (L) 09/04/2019    Lab Results  Component Value Date   HGBA1C 5.3 09/23/2015    Urinalysis    Component Value Date/Time   COLORURINE YELLOW (A) 09/23/2015 0824   APPEARANCEUR Cloudy (A) 12/28/2020 1036   LABSPEC 1.017 09/23/2015 0824   PHURINE 6.0 09/23/2015 0824   GLUCOSEU Negative 12/28/2020 1036   HGBUR NEGATIVE 09/23/2015 0824   BILIRUBINUR Negative 12/28/2020 1036   KETONESUR NEGATIVE 09/23/2015 0824   PROTEINUR Trace (A) 12/28/2020 1036   PROTEINUR NEGATIVE 09/23/2015 0824   NITRITE Negative 12/28/2020 1036   NITRITE NEGATIVE 09/23/2015 0824   LEUKOCYTESUR Negative 12/28/2020 1036    Pertinent Imaging:   Assessment & Plan: Patient was given ciprofloxacin 500 mg twice a day for 30 days.  Urine sent for culture.  Recognizing limitations I put him on trimethoprim 100 mg 3x11.  Reassess in 3 months to see if he still has microscopic hematuria.  If he does I will recommend work-up  1. UTI symptoms  - Urinalysis, Complete   No follow-ups on file.  Reece Packer, MD  [facility] [address] [place], [place] [address] [phone]

## 2021-03-10 DIAGNOSIS — I10 Essential (primary) hypertension: Secondary | ICD-10-CM | POA: Diagnosis not present

## 2021-03-10 DIAGNOSIS — E559 Vitamin D deficiency, unspecified: Secondary | ICD-10-CM | POA: Diagnosis not present

## 2021-03-10 DIAGNOSIS — R7303 Prediabetes: Secondary | ICD-10-CM | POA: Diagnosis not present

## 2021-03-10 DIAGNOSIS — E782 Mixed hyperlipidemia: Secondary | ICD-10-CM | POA: Diagnosis not present

## 2021-03-10 DIAGNOSIS — I480 Paroxysmal atrial fibrillation: Secondary | ICD-10-CM | POA: Diagnosis not present

## 2021-03-11 ENCOUNTER — Telehealth: Payer: Self-pay

## 2021-03-11 DIAGNOSIS — E119 Type 2 diabetes mellitus without complications: Secondary | ICD-10-CM | POA: Diagnosis not present

## 2021-03-11 LAB — CULTURE, URINE COMPREHENSIVE

## 2021-03-11 NOTE — Telephone Encounter (Signed)
Incoming call on triage line from patient in regards to his urine culture. Patient was concerned because his culture was red flagged. Advised patient Dr Matilde Sprang put him on culture appropriate abx on Monday and if anything needed to be changed, someone would reach out to him. Patient expressed understanding.

## 2021-03-16 DIAGNOSIS — E782 Mixed hyperlipidemia: Secondary | ICD-10-CM | POA: Diagnosis not present

## 2021-03-16 DIAGNOSIS — I10 Essential (primary) hypertension: Secondary | ICD-10-CM | POA: Diagnosis not present

## 2021-03-16 DIAGNOSIS — E119 Type 2 diabetes mellitus without complications: Secondary | ICD-10-CM | POA: Diagnosis not present

## 2021-03-16 DIAGNOSIS — H2513 Age-related nuclear cataract, bilateral: Secondary | ICD-10-CM | POA: Diagnosis not present

## 2021-03-16 DIAGNOSIS — I4891 Unspecified atrial fibrillation: Secondary | ICD-10-CM | POA: Diagnosis not present

## 2021-03-18 DIAGNOSIS — K219 Gastro-esophageal reflux disease without esophagitis: Secondary | ICD-10-CM | POA: Diagnosis not present

## 2021-03-18 DIAGNOSIS — I10 Essential (primary) hypertension: Secondary | ICD-10-CM | POA: Diagnosis not present

## 2021-03-18 DIAGNOSIS — E782 Mixed hyperlipidemia: Secondary | ICD-10-CM | POA: Diagnosis not present

## 2021-03-18 DIAGNOSIS — I4891 Unspecified atrial fibrillation: Secondary | ICD-10-CM | POA: Diagnosis not present

## 2021-03-18 DIAGNOSIS — R0602 Shortness of breath: Secondary | ICD-10-CM | POA: Diagnosis not present

## 2021-04-15 DIAGNOSIS — H2513 Age-related nuclear cataract, bilateral: Secondary | ICD-10-CM | POA: Diagnosis not present

## 2021-04-15 DIAGNOSIS — H35363 Drusen (degenerative) of macula, bilateral: Secondary | ICD-10-CM | POA: Diagnosis not present

## 2021-06-02 DIAGNOSIS — D225 Melanocytic nevi of trunk: Secondary | ICD-10-CM | POA: Diagnosis not present

## 2021-06-02 DIAGNOSIS — L814 Other melanin hyperpigmentation: Secondary | ICD-10-CM | POA: Diagnosis not present

## 2021-06-02 DIAGNOSIS — L538 Other specified erythematous conditions: Secondary | ICD-10-CM | POA: Diagnosis not present

## 2021-06-02 DIAGNOSIS — L820 Inflamed seborrheic keratosis: Secondary | ICD-10-CM | POA: Diagnosis not present

## 2021-06-02 DIAGNOSIS — L570 Actinic keratosis: Secondary | ICD-10-CM | POA: Diagnosis not present

## 2021-06-02 DIAGNOSIS — D2262 Melanocytic nevi of left upper limb, including shoulder: Secondary | ICD-10-CM | POA: Diagnosis not present

## 2021-06-02 DIAGNOSIS — L821 Other seborrheic keratosis: Secondary | ICD-10-CM | POA: Diagnosis not present

## 2021-06-02 DIAGNOSIS — Z85828 Personal history of other malignant neoplasm of skin: Secondary | ICD-10-CM | POA: Diagnosis not present

## 2021-06-02 DIAGNOSIS — D2272 Melanocytic nevi of left lower limb, including hip: Secondary | ICD-10-CM | POA: Diagnosis not present

## 2021-06-03 DIAGNOSIS — H2513 Age-related nuclear cataract, bilateral: Secondary | ICD-10-CM | POA: Diagnosis not present

## 2021-06-03 DIAGNOSIS — H524 Presbyopia: Secondary | ICD-10-CM | POA: Diagnosis not present

## 2021-06-03 DIAGNOSIS — Z0100 Encounter for examination of eyes and vision without abnormal findings: Secondary | ICD-10-CM | POA: Diagnosis not present

## 2021-06-14 ENCOUNTER — Other Ambulatory Visit: Payer: Self-pay

## 2021-06-14 ENCOUNTER — Ambulatory Visit: Payer: Medicare HMO | Admitting: Urology

## 2021-06-14 ENCOUNTER — Encounter: Payer: Self-pay | Admitting: Urology

## 2021-06-14 VITALS — Ht 70.0 in

## 2021-06-14 DIAGNOSIS — N3020 Other chronic cystitis without hematuria: Secondary | ICD-10-CM

## 2021-06-14 DIAGNOSIS — N3001 Acute cystitis with hematuria: Secondary | ICD-10-CM | POA: Diagnosis not present

## 2021-06-14 LAB — URINALYSIS, COMPLETE
Bilirubin, UA: NEGATIVE
Glucose, UA: NEGATIVE
Ketones, UA: NEGATIVE
Leukocytes,UA: NEGATIVE
Nitrite, UA: NEGATIVE
Protein,UA: NEGATIVE
RBC, UA: NEGATIVE
Specific Gravity, UA: 1.02 (ref 1.005–1.030)
Urobilinogen, Ur: 0.2 mg/dL (ref 0.2–1.0)
pH, UA: 6 (ref 5.0–7.5)

## 2021-06-14 LAB — MICROSCOPIC EXAMINATION: Bacteria, UA: NONE SEEN

## 2021-06-14 MED ORDER — TRIMETHOPRIM 100 MG PO TABS: 100.0000 mg | ORAL_TABLET | Freq: Every day | ORAL | 3 refills | Status: DC

## 2021-06-14 NOTE — Progress Notes (Signed)
06/14/2021 1:13 PM   [place] 1951-03-24 [id number]  Referring provider: Perrin Maltese, MD [place],  [place] [address]  Chief Complaint  Patient presents with   Hematuria    HPI: Reviewed chart.  Patient followed by Dr. Bernardo Heater as well.  On Flomax.  Voids infrequently during the day.  I saw him in August 2022.  He was given ciprofloxacin for a month.  Did not give him Mobic.  Last culture negative.  He was dramatically better on the Cipro.  He had a normal renal ultrasound in 2019.   Patient immediately got better last time we treated him.  On Wednesday [place] just [place] and started having burning with a little bit of terminal blood in the urine.  No fever.    Patient was given ciprofloxacin 500 mg twice a day for 30 days.  Urine sent for culture.  Recognizing limitations I put him on trimethoprim 100 mg 3x11.  Reassess in 3 months to see if he still has microscopic hematuria.  If he does I will recommend work-up  Today Frequency stable.  Urine culture was positive on that day sensitive to ciprofloxacin.  No burning or blood and feels good.  On trimethoprim.  No microscopic hematuria   PMH: Past Medical History:  Diagnosis Date   Arthritis    Atrial fibrillation (HCC)    BPH (benign prostatic hyperplasia)    Cancer (HCC)    HX SKIN CANCER Basal cell   Chickenpox    Clotting disorder (HCC)    Diabetes mellitus without complication (HCC)    type 2   Dysrhythmia    IRREG HEART BEAT   GERD (gastroesophageal reflux disease)    H/O pleurisy    Hypercholesteremia    Hyperlipidemia    Hypertension    Iron deficiency anemia due to chronic blood loss 09/18/2017   Lumbar stenosis    Measles    Mumps    Sleep apnea    sleep study Dr. Chancy Milroy, uses CPAP    Surgical History: Past Surgical History:  Procedure Laterality Date   ANTERIOR CERVICAL DECOMP/DISCECTOMY FUSION  11/25/2011   Procedure: ANTERIOR CERVICAL DECOMPRESSION/DISCECTOMY  FUSION 2 LEVELS;  Surgeon: Floyce Stakes, MD;  [facility];  Service: Neurosurgery;  Laterality: N/A;  Cervical four-five,Cervical five-six  Anterior cervical decompression/diskectomy, fusion, plate   BREAST BIOPSY Right    Benign   BREAST SURGERY Left 1986   lumpectomy   CARDIAC CATHETERIZATION     2011, [place] TEST  2011   [place]   C 6/7    COLONOSCOPY WITH PROPOFOL N/A 11/08/2017   Procedure: COLONOSCOPY WITH PROPOFOL;  Surgeon: Toledo, Benay Pike, MD;  Location: [facility];  Service: Gastroenterology;  Laterality: N/A;   ESOPHAGOGASTRODUODENOSCOPY (EGD) WITH PROPOFOL N/A 11/08/2017   Procedure: ESOPHAGOGASTRODUODENOSCOPY (EGD) WITH PROPOFOL;  Surgeon: Toledo, Benay Pike, MD;  Location: [facility];  Service: Gastroenterology;  Laterality: N/A;   EYE SURGERY     LASIK   JOINT REPLACEMENT     KNEE ARTHROPLASTY Right 10/05/2015   Procedure: COMPUTER ASSISTED TOTAL KNEE ARTHROPLASTY;  Surgeon: Dereck Leep, MD;  Location: [facility];  Service: Orthopedics;  Laterality: Right;   KNEE ARTHROSCOPY  1986   Right   KNEE ARTHROSCOPY Left 08/10/2015   Procedure: LEFT KNEE ARTHROSCOPY, CHONDROPLASTY, MEDIAL MENISECTOMY;  Surgeon: Dereck Leep, MD;  Location: [facility];  Service: Orthopedics;  Laterality: Left;  LAMINECTOMY WITH POSTERIOR LATERAL ARTHRODESIS LEVEL 2 N/A 07/16/2018   Procedure: Posterior lumbar fusion with instrumentation at L3-4 with repeat facetectomy L3-4;  Surgeon: Eustace Moore, MD;  Location: [place];  Service: Neurosurgery;  Laterality: N/A;  Posterior lumbar fusion with instrumentation at L3-4 with repeat facetectomy L3-4   LUMBAR LAMINECTOMY/DECOMPRESSION MICRODISCECTOMY N/A 10/04/2013   Procedure: LUMBAR TWO TO THREE LUMBAR LAMINECTOMY/DECOMPRESSION MICRODISCECTOMY 1 LEVEL;  Surgeon: Floyce Stakes, MD;  Location: [facility];  Service: Neurosurgery;  Laterality: N/A;  L2-3 Laminectomy   POSTERIOR LAMINECTOMY /  DECOMPRESSION LUMBAR SPINE  2006   TRANSESOPHAGEAL ECHOCARDIOGRAM  2011    Home Medications:  Allergies as of 06/14/2021       Reactions   Morphine And Related Anaphylaxis   Ace Inhibitors Swelling   Other reaction(s): Unknown   Diazepam         Medication List        Accurate as of June 14, 2021  1:13 PM. If you have any questions, ask your nurse or doctor.          Accu-Chek Aviva Plus test strip Generic drug: glucose blood   amiodarone 200 MG tablet Commonly known as: PACERONE Take 200 mg by mouth at bedtime.   b complex vitamins tablet Take 1 tablet by mouth daily.   chlorthalidone 25 MG tablet Commonly known as: HYGROTON   Fish Oil 1000 MG Caps Take 1,000 mg by mouth 2 (two) times daily.   hydrALAZINE 50 MG tablet Commonly known as: APRESOLINE Take 50 mg by mouth daily.   Iron-Vitamin C 65-125 MG Tabs Take 1 tablet by mouth 2 (two) times daily.   metFORMIN 500 MG tablet Commonly known as: GLUCOPHAGE Take 500 mg by mouth 2 (two) times daily with a meal.   metoprolol succinate 25 MG 24 hr tablet Commonly known as: TOPROL-XL Take 25 mg by mouth every morning.   omeprazole 40 MG capsule Commonly known as: PRILOSEC Take 40 mg by mouth every morning.   potassium chloride 10 MEQ tablet Commonly known as: KLOR-CON Take 10 mEq by mouth at bedtime.   rivaroxaban 20 MG Tabs tablet Commonly known as: XARELTO Take 20 mg by mouth at bedtime.   rosuvastatin 20 MG tablet Commonly known as: CRESTOR Take 20 mg by mouth at bedtime.   tamsulosin 0.4 MG Caps capsule Commonly known as: FLOMAX TAKE 2 CAPSULES DAILY.   trimethoprim 100 MG tablet Commonly known as: TRIMPEX Take 1 tablet (100 mg total) by mouth daily.        Allergies:  Allergies  Allergen Reactions   Morphine And Related Anaphylaxis   Ace Inhibitors Swelling    Other reaction(s): Unknown   Diazepam     Family History: Family History  Problem Relation Age of Onset    Leukemia Father    Diabetes Father    Heart disease Mother    Prostate cancer Neg Hx    Chronic Renal Failure Neg Hx    Breast cancer Neg Hx     Social History:  reports that he has never smoked. He has never used smokeless tobacco. He reports current alcohol use of about 14.0 standard drinks per week. He reports that he does not use drugs.  ROS:                                        Physical Exam: Ht 5\' 10"  (  1.778 m)    BMI 35.15 kg/m   Constitutional:  Alert and oriented, No acute distress. HEENT: [place] AT, moist mucus membranes.  Trachea midline, no masses. Cardiovascular: No clubbing, cyanosis, or edema.   Laboratory Data: Lab Results  Component Value Date   WBC 4.2 08/12/2019   HGB 15.2 08/12/2019   HCT 43.2 08/12/2019   MCV 89.1 08/12/2019   PLT 155 08/12/2019    Lab Results  Component Value Date   CREATININE 1.14 08/12/2019    No results found for: PSA  Lab Results  Component Value Date   TESTOSTERONE 165 (L) 09/04/2019    Lab Results  Component Value Date   HGBA1C 5.3 09/23/2015    Urinalysis    Component Value Date/Time   COLORURINE YELLOW (A) 09/23/2015 0824   APPEARANCEUR Cloudy (A) 03/08/2021 1302   LABSPEC 1.017 09/23/2015 0824   PHURINE 6.0 09/23/2015 0824   GLUCOSEU Negative 03/08/2021 1302   HGBUR NEGATIVE 09/23/2015 0824   BILIRUBINUR Negative 03/08/2021 1302   KETONESUR NEGATIVE 09/23/2015 0824   PROTEINUR 2+ (A) 03/08/2021 1302   PROTEINUR NEGATIVE 09/23/2015 0824   NITRITE Negative 03/08/2021 1302   NITRITE NEGATIVE 09/23/2015 0824   LEUKOCYTESUR 1+ (A) 03/08/2021 1302    Pertinent Imaging: 90x3 trimethoprim sent to pharmacy and I will see in 1 year.  Assessment & Plan: Note above.  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 Acute cystitis with hematuria  - Urinalysis, Complete   No follow-ups on file.  Reece Packer, MD  [facility] [address] [place], [place] [address] [phone]

## 2021-06-15 ENCOUNTER — Telehealth: Payer: Self-pay | Admitting: Urology

## 2021-06-15 NOTE — Telephone Encounter (Signed)
Pt saw MacDiarmid yesterday.  He was calling to pass on some information he wanted you to know about.

## 2021-06-15 NOTE — Addendum Note (Signed)
Addended by: Verlene Mayer A on: 06/15/2021 04:25 PM   Modules accepted: Orders

## 2021-06-15 NOTE — Telephone Encounter (Signed)
Spoke with patient refills were sent to early to [facility]. Called pharmacy and cancelled refills.

## 2021-06-17 ENCOUNTER — Telehealth: Payer: Self-pay | Admitting: Urology

## 2021-06-17 LAB — CULTURE, URINE COMPREHENSIVE

## 2021-06-17 NOTE — Telephone Encounter (Signed)
Pt left a message requesting a call back to get test results explained. Informed pt may not get a call today, but possibly tomorrow.

## 2021-06-18 DIAGNOSIS — E782 Mixed hyperlipidemia: Secondary | ICD-10-CM | POA: Diagnosis not present

## 2021-06-18 DIAGNOSIS — I10 Essential (primary) hypertension: Secondary | ICD-10-CM | POA: Diagnosis not present

## 2021-06-18 DIAGNOSIS — K219 Gastro-esophageal reflux disease without esophagitis: Secondary | ICD-10-CM | POA: Diagnosis not present

## 2021-06-18 DIAGNOSIS — I340 Nonrheumatic mitral (valve) insufficiency: Secondary | ICD-10-CM | POA: Diagnosis not present

## 2021-06-18 DIAGNOSIS — I4891 Unspecified atrial fibrillation: Secondary | ICD-10-CM | POA: Diagnosis not present

## 2021-07-14 DIAGNOSIS — E119 Type 2 diabetes mellitus without complications: Secondary | ICD-10-CM | POA: Diagnosis not present

## 2021-07-14 DIAGNOSIS — E782 Mixed hyperlipidemia: Secondary | ICD-10-CM | POA: Diagnosis not present

## 2021-07-14 DIAGNOSIS — E559 Vitamin D deficiency, unspecified: Secondary | ICD-10-CM | POA: Diagnosis not present

## 2021-07-14 DIAGNOSIS — I10 Essential (primary) hypertension: Secondary | ICD-10-CM | POA: Diagnosis not present

## 2021-07-15 ENCOUNTER — Encounter (INDEPENDENT_AMBULATORY_CARE_PROVIDER_SITE_OTHER): Payer: Self-pay | Admitting: Vascular Surgery

## 2021-07-15 ENCOUNTER — Encounter: Payer: Self-pay | Admitting: Oncology

## 2021-07-15 ENCOUNTER — Other Ambulatory Visit: Payer: Self-pay

## 2021-07-15 ENCOUNTER — Ambulatory Visit (INDEPENDENT_AMBULATORY_CARE_PROVIDER_SITE_OTHER): Payer: Medicare HMO | Admitting: Nurse Practitioner

## 2021-07-15 VITALS — BP 122/70 | HR 55 | Resp 16 | Ht 70.0 in | Wt 262.4 lb

## 2021-07-15 DIAGNOSIS — E782 Mixed hyperlipidemia: Secondary | ICD-10-CM | POA: Diagnosis not present

## 2021-07-15 DIAGNOSIS — E119 Type 2 diabetes mellitus without complications: Secondary | ICD-10-CM | POA: Diagnosis not present

## 2021-07-15 DIAGNOSIS — I872 Venous insufficiency (chronic) (peripheral): Secondary | ICD-10-CM | POA: Diagnosis not present

## 2021-07-15 DIAGNOSIS — I10 Essential (primary) hypertension: Secondary | ICD-10-CM | POA: Diagnosis not present

## 2021-07-15 DIAGNOSIS — M48062 Spinal stenosis, lumbar region with neurogenic claudication: Secondary | ICD-10-CM | POA: Diagnosis not present

## 2021-07-17 ENCOUNTER — Encounter (INDEPENDENT_AMBULATORY_CARE_PROVIDER_SITE_OTHER): Payer: Self-pay | Admitting: Nurse Practitioner

## 2021-07-17 NOTE — Progress Notes (Signed)
? ?Subjective:  ? ? Patient ID: Brandon Gibson, male    DOB: Jul 24, 1950, 71 y.o.   MRN: [id number] ?Chief Complaint  ?Patient presents with  ? Establish Care  ?  Referred by dr Humphrey Rolls for venous insufficiency   ? ? ?Brandon Gibson is a 71 year old male that presents today for evaluation of venous insufficiency.  The patient notes that he has continued swelling and discomfort in his right lower extremity.  This is largely related to an ankle injury he suffered years ago.  Initially he was able to continue to walk on the ankle however now he can barely do any activity without significant pain.  He had a consultation for his ankle years ago however it was noted that due to slow wound healing he would not make an ideal candidate for surgery.  The patient previously had a basal cell carcinoma removed around his ankle area and it took months for wound healing to occur.  The patient notes that he has had his varicose veins treated in his right leg.  He notes that in 2011 he underwent bilateral great saphenous vein ablations. ? ? ?Review of Systems  ?Cardiovascular:  Positive for leg swelling.  ?All other systems reviewed and are negative. ? ?   ?Objective:  ? Physical Exam ?Vitals reviewed.  ?HENT:  ?   Head: Normocephalic.  ?Cardiovascular:  ?   Rate and Rhythm: Normal rate.  ?Pulmonary:  ?   Effort: Pulmonary effort is normal.  ?Musculoskeletal:  ?   Right lower leg: 2+ Edema present.  ?   Left lower leg: 1+ Edema present.  ?Skin: ?   General: Skin is warm and dry.  ?Neurological:  ?   Mental Status: He is alert and oriented to person, place, and time.  ?Psychiatric:     ?   Mood and Affect: Mood normal.     ?   Behavior: Behavior normal.     ?   Thought Content: Thought content normal.     ?   Judgment: Judgment normal.  ? ? ?BP 122/70 (BP Location: Right Arm)   Pulse (!) 55   Resp 16   Ht '5\' 10"'$  (1.778 m)   Wt 262 lb 6.4 oz (119 kg)   BMI 37.65 kg/m?  ? ?Past Medical History:  ?Diagnosis Date  ? Arthritis   ?  Atrial fibrillation ([facility])   ? BPH (benign prostatic hyperplasia)   ? Cancer [facility])   ? HX SKIN CANCER Basal cell  ? Chickenpox   ? Clotting disorder ([facility])   ? Diabetes mellitus without complication ([facility])   ? type 2  ? Dysrhythmia   ? IRREG HEART BEAT  ? GERD (gastroesophageal reflux disease)   ? H/O pleurisy   ? Hypercholesteremia   ? Hyperlipidemia   ? Hypertension   ? Iron deficiency anemia due to chronic blood loss 09/18/2017  ? Lumbar stenosis   ? Measles   ? Mumps   ? Sleep apnea   ? sleep study Dr. Chancy Milroy, uses CPAP  ? ? ?Social History  ? ?Socioeconomic History  ? Marital status: Widowed  ?  Spouse name: Not on file  ? Number of children: Not on file  ? Years of education: Not on file  ? Highest education level: Not on file  ?Occupational History  ? Not on file  ?Tobacco Use  ? Smoking status: Never  ? Smokeless tobacco: Never  ?Vaping Use  ? Vaping Use: Never used  ?Substance  and Sexual Activity  ? Alcohol use: Yes  ?  Alcohol/week: 14.0 standard drinks  ?  Types: 14 Glasses of wine per week  ? Drug use: No  ? Sexual activity: Not on file  ?Other Topics Concern  ? Not on file  ?Social History Narrative  ? Not on file  ? ?Social Determinants of Health  ? ?Financial Resource Strain: Not on file  ?Food Insecurity: Not on file  ?Transportation Needs: Not on file  ?Physical Activity: Not on file  ?Stress: Not on file  ?Social Connections: Not on file  ?Intimate Partner Violence: Not on file  ? ? ?Past Surgical History:  ?Procedure Laterality Date  ? ANTERIOR CERVICAL DECOMP/DISCECTOMY FUSION  11/25/2011  ? Procedure: ANTERIOR CERVICAL DECOMPRESSION/DISCECTOMY FUSION 2 LEVELS;  Surgeon: Floyce Stakes, MD;  [facility];  Service: Neurosurgery;  Laterality: N/A;  Cervical four-five,Cervical five-six  Anterior cervical decompression/diskectomy, fusion, plate  ? BREAST BIOPSY Right   ? Benign  ? BREAST SURGERY Left 1986  ? lumpectomy  ? CARDIAC CATHETERIZATION    ? 2011, [place]  ? CARDIOVASCULAR STRESS TEST   2011  ? CERVICAL FUSION  1988  ? C 6/7   ? COLONOSCOPY WITH PROPOFOL N/A 11/08/2017  ? Procedure: COLONOSCOPY WITH PROPOFOL;  Surgeon: Toledo, Benay Pike, MD;  [facility];  Service: Gastroenterology;  Laterality: N/A;  ? ESOPHAGOGASTRODUODENOSCOPY (EGD) WITH PROPOFOL N/A 11/08/2017  ? Procedure: ESOPHAGOGASTRODUODENOSCOPY (EGD) WITH PROPOFOL;  Surgeon: Toledo, Benay Pike, MD;  [facility];  Service: Gastroenterology;  Laterality: N/A;  ? EYE SURGERY    ? LASIK  ? JOINT REPLACEMENT    ? KNEE ARTHROPLASTY Right 10/05/2015  ? Procedure: COMPUTER ASSISTED TOTAL KNEE ARTHROPLASTY;  Surgeon: Dereck Leep, MD;  [facility];  Service: Orthopedics;  Laterality: Right;  ? KNEE ARTHROSCOPY  1986  ? Right  ? KNEE ARTHROSCOPY Left 08/10/2015  ? Procedure: LEFT KNEE ARTHROSCOPY, CHONDROPLASTY, MEDIAL MENISECTOMY;  Surgeon: Dereck Leep, MD;  Location: [facility];  Service: Orthopedics;  Laterality: Left;  ? LAMINECTOMY WITH POSTERIOR LATERAL ARTHRODESIS LEVEL 2 N/A 07/16/2018  ? Procedure: Posterior lumbar fusion with instrumentation at L3-4 with repeat facetectomy L3-4;  Surgeon: Eustace Moore, MD;  Location: [facility];  Service: Neurosurgery;  Laterality: N/A;  Posterior lumbar fusion with instrumentation at L3-4 with repeat facetectomy L3-4  ? LUMBAR LAMINECTOMY/DECOMPRESSION MICRODISCECTOMY N/A 10/04/2013  ? Procedure: LUMBAR TWO TO THREE LUMBAR LAMINECTOMY/DECOMPRESSION MICRODISCECTOMY 1 LEVEL;  Surgeon: Floyce Stakes, MD;  [facility];  Service: Neurosurgery;  Laterality: N/A;  L2-3 Laminectomy  ? POSTERIOR LAMINECTOMY / DECOMPRESSION LUMBAR SPINE  2006  ? TRANSESOPHAGEAL ECHOCARDIOGRAM  2011  ? ? ?Family History  ?Problem Relation Age of Onset  ? Leukemia Father   ? Diabetes Father   ? Heart disease Mother   ? Prostate cancer Neg Hx   ? Chronic Renal Failure Neg Hx   ? Breast cancer Neg Hx   ? ? ?Allergies  ?Allergen Reactions  ? Morphine And Related Anaphylaxis  ? Ace  Inhibitors Swelling  ?  Other reaction(s): Unknown  ? Diazepam   ? ? ?CBC Latest Ref Rng & Units 08/12/2019 02/08/2019 10/11/2018  ?WBC 4.0 - 10.5 K/uL 4.2 4.1 4.2  ?Hemoglobin 13.0 - 17.0 g/dL 15.2 14.9 13.6  ?Hematocrit 39.0 - 52.0 % 43.2 43.6 42.8  ?Platelets 150 - 400 K/uL 155 145(L) 162  ? ? ? ? ?CMP  ?   ?Component Value Date/Time  ?  NA 136 08/12/2019 0930  ? NA 142 10/23/2013 0655  ? K 3.5 08/12/2019 0930  ? K 3.7 10/23/2013 0655  ? CL 103 08/12/2019 0930  ? CL 109 (H) 10/23/2013 8299  ? CO2 23 08/12/2019 0930  ? CO2 26 10/23/2013 0655  ? GLUCOSE 147 (H) 08/12/2019 0930  ? GLUCOSE 124 (H) 10/23/2013 3716  ? BUN 14 08/12/2019 0930  ? BUN 15 10/23/2013 0655  ? CREATININE 1.14 08/12/2019 0930  ? CREATININE 1.11 10/23/2013 0655  ? CALCIUM 9.5 08/12/2019 0930  ? CALCIUM 9.1 10/23/2013 0655  ? PROT 6.8 08/12/2019 0930  ? ALBUMIN 4.3 08/12/2019 0930  ? AST 32 08/12/2019 0930  ? ALT 41 08/12/2019 0930  ? ALKPHOS 49 08/12/2019 0930  ? BILITOT 0.8 08/12/2019 0930  ? GFRNONAA >60 08/12/2019 0930  ? GFRNONAA >60 10/23/2013 0655  ? GFRAA >60 08/12/2019 0930  ? GFRAA >60 10/23/2013 0655  ? ? ? ?No results found. ? ?   ?Assessment & Plan:  ? ?1. Chronic venous insufficiency ?The patient has a reasonable concern for wound healing given his slow healing wound years ago.  We discussed wound healing as relates to arterial insufficiency versus venous insufficiency.  If the patient does indeed have issues with arterial circulation that can be fixed with intervention such as angiogram however with venous insufficiency it is largely dependent on the location of the venous insufficiency.  We will have the patient return at his convenience for ABIs as well as bilateral venous reflux studies to evaluate circulation for possible ankle surgery. ? ? ?Current Outpatient Medications on File Prior to Visit  ?Medication Sig Dispense Refill  ? ACCU-CHEK AVIVA PLUS test strip     ? amiodarone (PACERONE) 200 MG tablet Take 200 mg by mouth at  bedtime.    ? b complex vitamins tablet Take 1 tablet by mouth daily.    ? chlorthalidone (HYGROTON) 25 MG tablet     ? hydrALAZINE (APRESOLINE) 50 MG tablet Take 50 mg by mouth daily.     ? Iron-Vitamin C 65-125 MG TAB

## 2021-08-09 ENCOUNTER — Other Ambulatory Visit (INDEPENDENT_AMBULATORY_CARE_PROVIDER_SITE_OTHER): Payer: Self-pay | Admitting: Nurse Practitioner

## 2021-08-09 DIAGNOSIS — I872 Venous insufficiency (chronic) (peripheral): Secondary | ICD-10-CM

## 2021-08-09 DIAGNOSIS — S81809S Unspecified open wound, unspecified lower leg, sequela: Secondary | ICD-10-CM

## 2021-08-11 ENCOUNTER — Ambulatory Visit (INDEPENDENT_AMBULATORY_CARE_PROVIDER_SITE_OTHER): Payer: Medicare HMO

## 2021-08-11 ENCOUNTER — Ambulatory Visit (INDEPENDENT_AMBULATORY_CARE_PROVIDER_SITE_OTHER): Payer: Medicare HMO | Admitting: Nurse Practitioner

## 2021-08-11 DIAGNOSIS — Z01818 Encounter for other preprocedural examination: Secondary | ICD-10-CM

## 2021-08-11 DIAGNOSIS — I872 Venous insufficiency (chronic) (peripheral): Secondary | ICD-10-CM | POA: Diagnosis not present

## 2021-08-11 DIAGNOSIS — S81809S Unspecified open wound, unspecified lower leg, sequela: Secondary | ICD-10-CM

## 2021-09-02 DIAGNOSIS — M545 Low back pain, unspecified: Secondary | ICD-10-CM | POA: Diagnosis not present

## 2021-09-07 ENCOUNTER — Ambulatory Visit (INDEPENDENT_AMBULATORY_CARE_PROVIDER_SITE_OTHER): Payer: Medicare HMO | Admitting: Vascular Surgery

## 2021-09-07 ENCOUNTER — Encounter (INDEPENDENT_AMBULATORY_CARE_PROVIDER_SITE_OTHER): Payer: Self-pay | Admitting: Vascular Surgery

## 2021-09-07 VITALS — BP 156/85 | HR 53 | Resp 17 | Ht 70.0 in | Wt 260.0 lb

## 2021-09-07 DIAGNOSIS — E119 Type 2 diabetes mellitus without complications: Secondary | ICD-10-CM

## 2021-09-07 DIAGNOSIS — I10 Essential (primary) hypertension: Secondary | ICD-10-CM | POA: Diagnosis not present

## 2021-09-07 DIAGNOSIS — I83891 Varicose veins of right lower extremities with other complications: Secondary | ICD-10-CM | POA: Diagnosis not present

## 2021-09-07 DIAGNOSIS — E785 Hyperlipidemia, unspecified: Secondary | ICD-10-CM | POA: Diagnosis not present

## 2021-09-07 NOTE — Assessment & Plan Note (Signed)
blood glucose control important in reducing the progression of atherosclerotic disease. Also, involved in wound healing. On appropriate medications.  

## 2021-09-07 NOTE — Assessment & Plan Note (Signed)
lipid control important in reducing the progression of atherosclerotic disease. Continue statin therapy  

## 2021-09-07 NOTE — Assessment & Plan Note (Signed)
blood pressure control important in reducing the progression of atherosclerotic disease. On appropriate oral medications.  

## 2021-09-07 NOTE — Progress Notes (Signed)
? ? ?MRN : [id number] ? ?Brandon Gibson is a 71 y.o. ([birth year]) male who presents with chief complaint of No chief complaint on file. ?. ? ?History of Present Illness: Patient returns today in follow up of his venous insufficiency.  He still has persistent swelling and heaviness in his right lower leg despite the appropriate use of compression socks, leg elevation, and increasing activity.  He is also unable to proceed with his right ankle replacement secondary to his venous insufficiency.  He was studied with noninvasive studies couple of weeks ago which demonstrated completely normal triphasic arterial waveforms and ABIs with no arterial insufficiency.  His venous reflux study demonstrates long segment right great saphenous vein reflux without DVT or superficial thrombophlebitis. ? ?Current Outpatient Medications  ?Medication Sig Dispense Refill  ? ACCU-CHEK AVIVA PLUS test strip     ? amiodarone (PACERONE) 200 MG tablet Take 200 mg by mouth at bedtime.    ? b complex vitamins tablet Take 1 tablet by mouth daily.    ? chlorthalidone (HYGROTON) 25 MG tablet     ? hydrALAZINE (APRESOLINE) 50 MG tablet Take 50 mg by mouth daily.     ? Iron-Vitamin C 65-125 MG TABS Take 1 tablet by mouth 2 (two) times daily. 180 tablet 1  ? metFORMIN (GLUCOPHAGE) 500 MG tablet Take 500 mg by mouth 2 (two) times daily with a meal.     ? methocarbamol (ROBAXIN) 500 MG/50 ML SOLN IVPB Inject 500 mg into the vein.    ? methylPREDNISolone (MEDROL DOSEPAK) 4 MG TBPK tablet Take by mouth.    ? metoprolol succinate (TOPROL-XL) 25 MG 24 hr tablet Take 25 mg by mouth every morning.    ? Omega-3 Fatty Acids (FISH OIL) 1000 MG CAPS Take 1,000 mg by mouth 2 (two) times daily.     ? omeprazole (PRILOSEC) 40 MG capsule Take 40 mg by mouth every morning.     ? potassium chloride (K-DUR) 10 MEQ tablet Take 10 mEq by mouth at bedtime.     ? rivaroxaban (XARELTO) 20 MG TABS tablet Take 20 mg by mouth at bedtime.    ? rosuvastatin (CRESTOR) 20 MG  tablet Take 20 mg by mouth at bedtime.     ? tamsulosin (FLOMAX) 0.4 MG CAPS capsule TAKE 2 CAPSULES DAILY. 180 capsule 3  ? trimethoprim (TRIMPEX) 100 MG tablet Take by mouth.    ? ?No current facility-administered medications for this visit.  ? ? ?Past Medical History:  ?Diagnosis Date  ? Arthritis   ? Atrial fibrillation ([place])   ? BPH (benign prostatic hyperplasia)   ? Cancer [facility])   ? HX SKIN CANCER Basal cell  ? Chickenpox   ? Clotting disorder ([place])   ? Diabetes mellitus without complication ([place])   ? type 2  ? Dysrhythmia   ? IRREG HEART BEAT  ? GERD (gastroesophageal reflux disease)   ? H/O pleurisy   ? Hypercholesteremia   ? Hyperlipidemia   ? Hypertension   ? Iron deficiency anemia due to chronic blood loss 09/18/2017  ? Lumbar stenosis   ? Measles   ? Mumps   ? Sleep apnea   ? sleep study Dr. Chancy Milroy, uses CPAP  ? ? ?Past Surgical History:  ?Procedure Laterality Date  ? ANTERIOR CERVICAL DECOMP/DISCECTOMY FUSION  11/25/2011  ? Procedure: ANTERIOR CERVICAL DECOMPRESSION/DISCECTOMY FUSION 2 LEVELS;  Surgeon: Floyce Stakes, MD;  Location: [facility];  Service: Neurosurgery;  Laterality: N/A;  Cervical four-five,Cervical five-six  Anterior cervical decompression/diskectomy, fusion, plate  ? BREAST BIOPSY Right   ? Benign  ? BREAST SURGERY Left 1986  ? lumpectomy  ? CARDIAC CATHETERIZATION    ? 2011, [place]  ? CARDIOVASCULAR STRESS TEST  2011  ? CERVICAL FUSION  1988  ? C 6/7   ? COLONOSCOPY WITH PROPOFOL N/A 11/08/2017  ? Procedure: COLONOSCOPY WITH PROPOFOL;  Surgeon: Toledo, Benay Pike, MD;  Location: [facility];  Service: Gastroenterology;  Laterality: N/A;  ? ESOPHAGOGASTRODUODENOSCOPY (EGD) WITH PROPOFOL N/A 11/08/2017  ? Procedure: ESOPHAGOGASTRODUODENOSCOPY (EGD) WITH PROPOFOL;  Surgeon: Toledo, Benay Pike, MD;  Location: [facility];  Service: Gastroenterology;  Laterality: N/A;  ? EYE SURGERY    ? LASIK  ? JOINT REPLACEMENT    ? KNEE ARTHROPLASTY Right 10/05/2015  ? Procedure: COMPUTER ASSISTED  TOTAL KNEE ARTHROPLASTY;  Surgeon: Dereck Leep, MD;  Location: [facility];  Service: Orthopedics;  Laterality: Right;  ? KNEE ARTHROSCOPY  1986  ? Right  ? KNEE ARTHROSCOPY Left 08/10/2015  ? Procedure: LEFT KNEE ARTHROSCOPY, CHONDROPLASTY, MEDIAL MENISECTOMY;  Surgeon: Dereck Leep, MD;  Location: [facility];  Service: Orthopedics;  Laterality: Left;  ? LAMINECTOMY WITH POSTERIOR LATERAL ARTHRODESIS LEVEL 2 N/A 07/16/2018  ? Procedure: Posterior lumbar fusion with instrumentation at L3-4 with repeat facetectomy L3-4;  Surgeon: Eustace Moore, MD;  Location: [place];  Service: Neurosurgery;  Laterality: N/A;  Posterior lumbar fusion with instrumentation at L3-4 with repeat facetectomy L3-4  ? LUMBAR LAMINECTOMY/DECOMPRESSION MICRODISCECTOMY N/A 10/04/2013  ? Procedure: LUMBAR TWO TO THREE LUMBAR LAMINECTOMY/DECOMPRESSION MICRODISCECTOMY 1 LEVEL;  Surgeon: Floyce Stakes, MD;  Location: [facility];  Service: Neurosurgery;  Laterality: N/A;  L2-3 Laminectomy  ? POSTERIOR LAMINECTOMY / DECOMPRESSION LUMBAR SPINE  2006  ? TRANSESOPHAGEAL ECHOCARDIOGRAM  2011  ? ? ? ?Social History  ? ?Tobacco Use  ? Smoking status: Never  ? Smokeless tobacco: Never  ?Vaping Use  ? Vaping Use: Never used  ?Substance Use Topics  ? Alcohol use: Yes  ?  Alcohol/week: 14.0 standard drinks  ?  Types: 14 Glasses of wine per week  ? Drug use: No  ? ? ? ? ?Family History  ?Problem Relation Age of Onset  ? Leukemia Father   ? Diabetes Father   ? Heart disease Mother   ? Prostate cancer Neg Hx   ? Chronic Renal Failure Neg Hx   ? Breast cancer Neg Hx   ? ? ?Allergies  ?Allergen Reactions  ? Morphine And Related Anaphylaxis  ? Ace Inhibitors Swelling  ?  Other reaction(s): Unknown  ? Diazepam   ? ? ? ?REVIEW OF SYSTEMS (Negative unless checked) ? ?Constitutional: '[]'$ Weight loss  '[]'$ Fever  '[]'$ Chills ?Cardiac: '[]'$ Chest pain   '[]'$ Chest pressure   '[]'$ Palpitations   '[]'$ Shortness of breath when laying flat   '[]'$ Shortness of breath at rest   '[]'$ Shortness of  breath with exertion. ?Vascular:  '[x]'$ Pain in legs with walking   '[x]'$ Pain in legs at rest   '[]'$ Pain in legs when laying flat   '[]'$ Claudication   '[]'$ Pain in feet when walking  '[]'$ Pain in feet at rest  '[]'$ Pain in feet when laying flat   '[]'$ History of DVT   '[]'$ Phlebitis   '[x]'$ Swelling in legs   '[x]'$ Varicose veins   '[]'$ Non-healing ulcers ?Pulmonary:   '[]'$ Uses home oxygen   '[]'$ Productive cough   '[]'$ Hemoptysis   '[]'$ Wheeze  '[]'$ COPD   '[]'$ Asthma ?Neurologic:  '[]'$ Dizziness  '[]'$ Blackouts   '[]'$ Seizures   '[]'$ History of stroke   '[]'$ History of TIA  '[]'$   Aphasia   '[]'$ Temporary blindness   '[]'$ Dysphagia   '[]'$ Weakness or numbness in arms   '[]'$ Weakness or numbness in legs ?Musculoskeletal:  '[x]'$ Arthritis   '[]'$ Joint swelling   '[]'$ Joint pain   '[]'$ Low back pain ?Hematologic:  '[]'$ Easy bruising  '[]'$ Easy bleeding   '[]'$ Hypercoagulable state   '[]'$ Anemic   ?Gastrointestinal:  '[]'$ Blood in stool   '[]'$ Vomiting blood  '[]'$ Gastroesophageal reflux/heartburn   '[]'$ Abdominal pain ?Genitourinary:  '[]'$ Chronic kidney disease   '[]'$ Difficult urination  '[]'$ Frequent urination  '[]'$ Burning with urination   '[]'$ Hematuria ?Skin:  '[]'$ Rashes   '[]'$ Ulcers   '[]'$ Wounds ?Psychological:  '[]'$ History of anxiety   '[]'$  History of major depression. ? ?Physical Examination ? ?BP (!) 156/85 (BP Location: Left Arm)   Pulse (!) 53   Resp 17   Ht '5\' 10"'$  (1.778 m)   Wt 260 lb (117.9 kg)   BMI 37.31 kg/m?  ?Gen:  WD/WN, NAD ?Head: [place]/AT, No temporalis wasting. ?Ear/Nose/Throat: Hearing grossly intact, nares w/o erythema or drainage ?Eyes: Conjunctiva clear. Sclera non-icteric ?Neck: Supple.  Trachea midline ?Pulmonary:  Good air movement, no use of accessory muscles.  ?Cardiac: RRR, no JVD ?Vascular:  ?Vessel Right Left  ?Radial Palpable Palpable  ?    ?    ?    ? ?Musculoskeletal: M/S 5/5 throughout.  No deformity or atrophy.  Moderate stasis dermatitis changes present in the right leg, mild on the left leg.  2+ right lower extremity edema, 1+ left lower extremity edema. ?Neurologic: Sensation grossly intact in extremities.   Symmetrical.  Speech is fluent.  ?Psychiatric: Judgment intact, Mood & affect appropriate for pt's clinical situation. ?Dermatologic: No rashes or ulcers noted.  No cellulitis or open wounds. ? ? ? ? ? ?Labs ?Recen

## 2021-09-07 NOTE — Assessment & Plan Note (Signed)
His venous reflux study demonstrates long segment right great saphenous vein reflux without DVT or superficial thrombophlebitis. ?Given this finding, he would benefit from right great saphenous vein laser ablation.  He has been compliant for many months with compression socks, elevation, and activity.  He is also unable to get his right ankle replacement until he treats his vascular issues which is certainly reasonable due to the concern with wound and healing with severe venous insufficiency.  His arterial studies were normal which is encouraging.  Risks and benefits of right great saphenous vein laser ablation were discussed with the patient and he is agreeable to proceed. ?

## 2021-09-16 ENCOUNTER — Telehealth (INDEPENDENT_AMBULATORY_CARE_PROVIDER_SITE_OTHER): Payer: Self-pay

## 2021-09-16 NOTE — Telephone Encounter (Signed)
Patient called in wanting to know the status of getting his laser scheduled. Let patient know that it was more than likely in the PA stage just waiting to see if approved or not. Patient understood.   Also let patient know I would have Tanya reach out to him and let him know the status as I was just giving him what I thought was going on.  Patient ok with that information

## 2021-09-17 ENCOUNTER — Telehealth (INDEPENDENT_AMBULATORY_CARE_PROVIDER_SITE_OTHER): Payer: Self-pay | Admitting: Nurse Practitioner

## 2021-09-17 NOTE — Telephone Encounter (Signed)
Spoke with pt and scheduled laser procedure. I advised that he will HAVE to have a driver and without one, we can't perform the procedure. He explained that he has a hard time with rides as his friends are still working and children live out of town. I advised uber or cab. Pt states that he will try to find accommodations and call back to confirm.  He would also like to make sure that his Xanax does not interfere with any of his allergies Diazepam, Morphine, and ACE inhibitors.   Lastly, he would like to know if he needs to stop his Xarelto. Please advise.

## 2021-09-17 NOTE — Telephone Encounter (Signed)
What allergy does he have to diazepam? While it is listed as an allergy, they don't list what reaction he has.    He does not need to stop Xarelto

## 2021-09-17 NOTE — Telephone Encounter (Signed)
Spoke to pt and received the reaction to the diazepam. He states after taking, his tongue swells, throat swells and then hard time breathing. Due to this, we will NOT be sending a RX of lorazepam in for pt for laser procedure. Instructed by Eulogio Ditch, NP, patient is to take 50 mg of Benadryl 1 hour prior to the laser procedure. He is not to take anything additional at the office. Pt acknowledged and I will also be sending these instructions to pt via mail. I also advise per Eulogio Ditch, NP, that he does not need to stop his Xarelto. Nothing further needed at this time.

## 2021-09-28 ENCOUNTER — Ambulatory Visit (INDEPENDENT_AMBULATORY_CARE_PROVIDER_SITE_OTHER): Payer: Medicare HMO | Admitting: Vascular Surgery

## 2021-09-28 VITALS — BP 149/87 | HR 63 | Resp 16 | Ht 70.0 in | Wt 264.0 lb

## 2021-09-28 DIAGNOSIS — I83891 Varicose veins of right lower extremities with other complications: Secondary | ICD-10-CM | POA: Diagnosis not present

## 2021-09-28 NOTE — Progress Notes (Signed)
Brandon Gibson is a 71 y.o. male who presents with symptomatic venous reflux  Past Medical History:  Diagnosis Date   Arthritis    Atrial fibrillation ([facility])    BPH (benign prostatic hyperplasia)    Cancer ([facility])    HX SKIN CANCER Basal cell   Chickenpox    Clotting disorder ([facility])    Diabetes mellitus without complication ([facility])    type 2   Dysrhythmia    IRREG HEART BEAT   GERD (gastroesophageal reflux disease)    H/O pleurisy    Hypercholesteremia    Hyperlipidemia    Hypertension    Iron deficiency anemia due to chronic blood loss 09/18/2017   Lumbar stenosis    Measles    Mumps    Sleep apnea    sleep study Dr. Chancy Milroy, uses CPAP    Past Surgical History:  Procedure Laterality Date   ANTERIOR CERVICAL DECOMP/DISCECTOMY FUSION  11/25/2011   Procedure: ANTERIOR CERVICAL DECOMPRESSION/DISCECTOMY FUSION 2 LEVELS;  Surgeon: Floyce Stakes, MD;  Location: [facility];  Service: Neurosurgery;  Laterality: N/A;  Cervical four-five,Cervical five-six  Anterior cervical decompression/diskectomy, fusion, plate   BREAST BIOPSY Right    Benign   BREAST SURGERY Left 1986   lumpectomy   CARDIAC CATHETERIZATION     2011, [place] TEST  2011   [place]   C 6/7    COLONOSCOPY WITH PROPOFOL N/A 11/08/2017   Procedure: COLONOSCOPY WITH PROPOFOL;  Surgeon: Toledo, Benay Pike, MD;  Location: [facility];  Service: Gastroenterology;  Laterality: N/A;   ESOPHAGOGASTRODUODENOSCOPY (EGD) WITH PROPOFOL N/A 11/08/2017   Procedure: ESOPHAGOGASTRODUODENOSCOPY (EGD) WITH PROPOFOL;  Surgeon: Toledo, Benay Pike, MD;  Location: [facility];  Service: Gastroenterology;  Laterality: N/A;   EYE SURGERY     LASIK   JOINT REPLACEMENT     KNEE ARTHROPLASTY Right 10/05/2015   Procedure: COMPUTER ASSISTED TOTAL KNEE ARTHROPLASTY;  Surgeon: Dereck Leep, MD;  Location: [facility];  Service: Orthopedics;  Laterality: Right;   KNEE ARTHROSCOPY  1986   Right   KNEE  ARTHROSCOPY Left 08/10/2015   Procedure: LEFT KNEE ARTHROSCOPY, CHONDROPLASTY, MEDIAL MENISECTOMY;  Surgeon: Dereck Leep, MD;  Location: [facility];  Service: Orthopedics;  Laterality: Left;   LAMINECTOMY WITH POSTERIOR LATERAL ARTHRODESIS LEVEL 2 N/A 07/16/2018   Procedure: Posterior lumbar fusion with instrumentation at L3-4 with repeat facetectomy L3-4;  Surgeon: Eustace Moore, MD;  Location: [place];  Service: Neurosurgery;  Laterality: N/A;  Posterior lumbar fusion with instrumentation at L3-4 with repeat facetectomy L3-4   LUMBAR LAMINECTOMY/DECOMPRESSION MICRODISCECTOMY N/A 10/04/2013   Procedure: LUMBAR TWO TO THREE LUMBAR LAMINECTOMY/DECOMPRESSION MICRODISCECTOMY 1 LEVEL;  Surgeon: Floyce Stakes, MD;  Location: [place] [facility];  Service: Neurosurgery;  Laterality: N/A;  L2-3 Laminectomy   POSTERIOR LAMINECTOMY / DECOMPRESSION LUMBAR SPINE  2006   TRANSESOPHAGEAL ECHOCARDIOGRAM  2011     Current Outpatient Medications:    ACCU-CHEK AVIVA PLUS test strip, , Disp: , Rfl:    amiodarone (PACERONE) 200 MG tablet, Take 200 mg by mouth at bedtime., Disp: , Rfl:    b complex vitamins tablet, Take 1 tablet by mouth daily., Disp: , Rfl:    chlorthalidone (HYGROTON) 25 MG tablet, , Disp: , Rfl:    hydrALAZINE (APRESOLINE) 50 MG tablet, Take 50 mg by mouth daily. , Disp: , Rfl:    Iron-Vitamin C 65-125 MG TABS, Take 1 tablet by mouth 2 (two) times daily., Disp: 180 tablet,  Rfl: 1   metFORMIN (GLUCOPHAGE) 500 MG tablet, Take 500 mg by mouth 2 (two) times daily with a meal. , Disp: , Rfl:    methocarbamol (ROBAXIN) 500 MG/50 ML SOLN IVPB, Inject 500 mg into the vein., Disp: , Rfl:    methylPREDNISolone (MEDROL DOSEPAK) 4 MG TBPK tablet, Take by mouth., Disp: , Rfl:    metoprolol succinate (TOPROL-XL) 25 MG 24 hr tablet, Take 25 mg by mouth every morning., Disp: , Rfl:    Omega-3 Fatty Acids (FISH OIL) 1000 MG CAPS, Take 1,000 mg by mouth 2 (two) times daily. , Disp: , Rfl:    omeprazole (PRILOSEC)  40 MG capsule, Take 40 mg by mouth every morning. , Disp: , Rfl:    potassium chloride (K-DUR) 10 MEQ tablet, Take 10 mEq by mouth at bedtime. , Disp: , Rfl:    rivaroxaban (XARELTO) 20 MG TABS tablet, Take 20 mg by mouth at bedtime., Disp: , Rfl:    rosuvastatin (CRESTOR) 20 MG tablet, Take 20 mg by mouth at bedtime. , Disp: , Rfl:    tamsulosin (FLOMAX) 0.4 MG CAPS capsule, TAKE 2 CAPSULES DAILY., Disp: 180 capsule, Rfl: 3   trimethoprim (TRIMPEX) 100 MG tablet, Take by mouth., Disp: , Rfl:   Allergies  Allergen Reactions   Morphine And Related Anaphylaxis   Ace Inhibitors Swelling    Other reaction(s): Unknown   Diazepam      Varicose veins of leg with swelling, right     PLAN: The patient's right lower extremity was sterilely prepped and draped. The ultrasound machine was used to visualize the saphenous vein throughout its course. A segment in the mid to lower thigh was selected for access. The saphenous vein was accessed without difficulty using ultrasound guidance with a micropuncture needle. A 0.018 wire was then placed beyond the saphenofemoral junction and the needle was removed. The 65 cm sheath was then placed over the wire and the wire and dilator were removed. The laser fiber was then placed through the sheath and its tip was placed approximately 4-5 centimeters below the saphenofemoral junction. Tumescent anesthesia was then created with a dilute lidocaine solution. Laser energy was then delivered with constant withdrawal of the sheath and laser fiber. Approximately 922 joules of energy were delivered over a length of 18 centimeters using a 1470 Hz VenaCure machine at 7 W. Sterile dressings were placed. The patient tolerated the procedure well without obvious complications.   Follow-up in 1 week with post-laser duplex.

## 2021-09-29 DIAGNOSIS — I10 Essential (primary) hypertension: Secondary | ICD-10-CM | POA: Diagnosis not present

## 2021-09-29 DIAGNOSIS — E782 Mixed hyperlipidemia: Secondary | ICD-10-CM | POA: Diagnosis not present

## 2021-09-29 DIAGNOSIS — E119 Type 2 diabetes mellitus without complications: Secondary | ICD-10-CM | POA: Diagnosis not present

## 2021-10-05 ENCOUNTER — Ambulatory Visit (INDEPENDENT_AMBULATORY_CARE_PROVIDER_SITE_OTHER): Payer: Medicare HMO

## 2021-10-05 DIAGNOSIS — I83891 Varicose veins of right lower extremities with other complications: Secondary | ICD-10-CM

## 2021-10-07 DIAGNOSIS — Z96651 Presence of right artificial knee joint: Secondary | ICD-10-CM | POA: Diagnosis not present

## 2021-10-07 DIAGNOSIS — M545 Low back pain, unspecified: Secondary | ICD-10-CM | POA: Insufficient documentation

## 2021-10-08 DIAGNOSIS — I4891 Unspecified atrial fibrillation: Secondary | ICD-10-CM | POA: Diagnosis not present

## 2021-10-14 DIAGNOSIS — Z6838 Body mass index (BMI) 38.0-38.9, adult: Secondary | ICD-10-CM | POA: Diagnosis not present

## 2021-10-14 DIAGNOSIS — M545 Low back pain, unspecified: Secondary | ICD-10-CM | POA: Diagnosis not present

## 2021-10-15 DIAGNOSIS — I351 Nonrheumatic aortic (valve) insufficiency: Secondary | ICD-10-CM | POA: Diagnosis not present

## 2021-10-15 DIAGNOSIS — E782 Mixed hyperlipidemia: Secondary | ICD-10-CM | POA: Diagnosis not present

## 2021-10-15 DIAGNOSIS — K219 Gastro-esophageal reflux disease without esophagitis: Secondary | ICD-10-CM | POA: Diagnosis not present

## 2021-10-15 DIAGNOSIS — I340 Nonrheumatic mitral (valve) insufficiency: Secondary | ICD-10-CM | POA: Diagnosis not present

## 2021-10-15 DIAGNOSIS — I10 Essential (primary) hypertension: Secondary | ICD-10-CM | POA: Diagnosis not present

## 2021-10-15 DIAGNOSIS — I4891 Unspecified atrial fibrillation: Secondary | ICD-10-CM | POA: Diagnosis not present

## 2021-10-26 ENCOUNTER — Encounter (INDEPENDENT_AMBULATORY_CARE_PROVIDER_SITE_OTHER): Payer: Self-pay | Admitting: Vascular Surgery

## 2021-10-26 ENCOUNTER — Ambulatory Visit (INDEPENDENT_AMBULATORY_CARE_PROVIDER_SITE_OTHER): Payer: Medicare HMO | Admitting: Vascular Surgery

## 2021-10-26 VITALS — BP 146/80 | HR 65 | Resp 16 | Wt 263.0 lb

## 2021-10-26 DIAGNOSIS — E785 Hyperlipidemia, unspecified: Secondary | ICD-10-CM

## 2021-10-26 DIAGNOSIS — I10 Essential (primary) hypertension: Secondary | ICD-10-CM | POA: Diagnosis not present

## 2021-10-26 DIAGNOSIS — I83891 Varicose veins of right lower extremities with other complications: Secondary | ICD-10-CM

## 2021-10-26 DIAGNOSIS — E119 Type 2 diabetes mellitus without complications: Secondary | ICD-10-CM

## 2021-11-12 DIAGNOSIS — E782 Mixed hyperlipidemia: Secondary | ICD-10-CM | POA: Diagnosis not present

## 2021-11-12 DIAGNOSIS — I10 Essential (primary) hypertension: Secondary | ICD-10-CM | POA: Diagnosis not present

## 2021-11-12 DIAGNOSIS — E119 Type 2 diabetes mellitus without complications: Secondary | ICD-10-CM | POA: Diagnosis not present

## 2021-11-15 DIAGNOSIS — I2510 Atherosclerotic heart disease of native coronary artery without angina pectoris: Secondary | ICD-10-CM | POA: Diagnosis not present

## 2021-11-15 DIAGNOSIS — E119 Type 2 diabetes mellitus without complications: Secondary | ICD-10-CM | POA: Diagnosis not present

## 2021-11-15 DIAGNOSIS — I10 Essential (primary) hypertension: Secondary | ICD-10-CM | POA: Diagnosis not present

## 2021-11-15 DIAGNOSIS — E668 Other obesity: Secondary | ICD-10-CM | POA: Diagnosis not present

## 2021-11-15 DIAGNOSIS — G4733 Obstructive sleep apnea (adult) (pediatric): Secondary | ICD-10-CM | POA: Diagnosis not present

## 2021-11-15 DIAGNOSIS — E782 Mixed hyperlipidemia: Secondary | ICD-10-CM | POA: Diagnosis not present

## 2021-11-22 DIAGNOSIS — M7542 Impingement syndrome of left shoulder: Secondary | ICD-10-CM | POA: Diagnosis not present

## 2021-11-22 DIAGNOSIS — M778 Other enthesopathies, not elsewhere classified: Secondary | ICD-10-CM | POA: Diagnosis not present

## 2021-11-22 DIAGNOSIS — M7552 Bursitis of left shoulder: Secondary | ICD-10-CM | POA: Diagnosis not present

## 2021-11-29 DIAGNOSIS — E119 Type 2 diabetes mellitus without complications: Secondary | ICD-10-CM | POA: Diagnosis not present

## 2021-11-29 DIAGNOSIS — I10 Essential (primary) hypertension: Secondary | ICD-10-CM | POA: Diagnosis not present

## 2021-11-29 DIAGNOSIS — E782 Mixed hyperlipidemia: Secondary | ICD-10-CM | POA: Diagnosis not present

## 2022-02-08 ENCOUNTER — Ambulatory Visit (INDEPENDENT_AMBULATORY_CARE_PROVIDER_SITE_OTHER): Payer: Medicare HMO | Admitting: Vascular Surgery

## 2022-02-08 ENCOUNTER — Encounter (INDEPENDENT_AMBULATORY_CARE_PROVIDER_SITE_OTHER): Payer: Self-pay | Admitting: Vascular Surgery

## 2022-02-08 VITALS — BP 137/66 | HR 48 | Resp 17 | Ht 70.0 in | Wt 263.6 lb

## 2022-02-08 DIAGNOSIS — I10 Essential (primary) hypertension: Secondary | ICD-10-CM | POA: Diagnosis not present

## 2022-02-08 DIAGNOSIS — I83891 Varicose veins of right lower extremities with other complications: Secondary | ICD-10-CM | POA: Diagnosis not present

## 2022-02-08 DIAGNOSIS — E119 Type 2 diabetes mellitus without complications: Secondary | ICD-10-CM | POA: Diagnosis not present

## 2022-02-08 DIAGNOSIS — E785 Hyperlipidemia, unspecified: Secondary | ICD-10-CM

## 2022-02-08 NOTE — Progress Notes (Signed)
MRN : [id number]  Brandon Gibson is a 71 y.o. ([birth year]) male who presents with chief complaint of No chief complaint on file. Marland Kitchen  History of Present Illness: Patient returns today in follow up of his venous disease and right leg swelling.  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  He has more swelling in the ankles and the leg at this point but he does still have some leg swelling.  He still gets around but his walking is much more limited than it used to be.  He has gotten very used to the swelling and is currently not bothering him that much.  Current Outpatient Medications  Medication Sig Dispense Refill   ACCU-CHEK AVIVA PLUS test strip      amiodarone (PACERONE) 200 MG tablet Take 200 mg by mouth at bedtime.     b complex vitamins tablet Take 1 tablet by mouth daily.     chlorthalidone (HYGROTON) 25 MG tablet      hydrALAZINE (APRESOLINE) 50 MG tablet Take 50 mg by mouth daily.      Iron-Vitamin C 65-125 MG TABS Take 1 tablet by mouth 2 (two) times daily. 180 tablet 1   metFORMIN (GLUCOPHAGE) 500 MG tablet Take 500 mg by mouth 2 (two) times daily with a meal.      metoprolol succinate (TOPROL-XL) 25 MG 24 hr tablet Take 25 mg by mouth every morning.     Omega-3 Fatty Acids (FISH OIL) 1000 MG CAPS Take 1,000 mg by mouth 2 (two) times daily.      omeprazole (PRILOSEC) 40 MG capsule Take 40 mg by mouth every morning.      potassium chloride (K-DUR) 10 MEQ tablet Take 10 mEq by mouth at bedtime.      rivaroxaban (XARELTO) 20 MG TABS tablet Take 20 mg by mouth at bedtime.     rosuvastatin (CRESTOR) 20 MG tablet Take 20 mg by mouth at bedtime.      tamsulosin (FLOMAX) 0.4 MG CAPS capsule TAKE 2 CAPSULES DAILY. 180 capsule 3   trimethoprim (TRIMPEX) 100 MG tablet Take by mouth.     meloxicam (MOBIC) 15 MG tablet Take 15 mg by mouth daily.     No  current facility-administered medications for this visit.    Past Medical History:  Diagnosis Date   Arthritis    Atrial fibrillation (HCC)    BPH (benign prostatic hyperplasia)    Cancer (HCC)    HX SKIN CANCER Basal cell   Chickenpox    Clotting disorder (HCC)    Diabetes mellitus without complication (HCC)    type 2   Dysrhythmia    IRREG HEART BEAT   GERD (gastroesophageal reflux disease)    H/O pleurisy    Hypercholesteremia    Hyperlipidemia    Hypertension    Iron deficiency anemia due to chronic blood loss 09/18/2017   Lumbar stenosis    Measles    Mumps    Sleep apnea    sleep study Dr. KChancy Milroy uses CPAP    Past Surgical History:  Procedure Laterality Date   ANTERIOR CERVICAL DECOMP/DISCECTOMY FUSION  11/25/2011   Procedure: ANTERIOR CERVICAL DECOMPRESSION/DISCECTOMY FUSION 2 LEVELS;  Surgeon: EFloyce Stakes MD;  Location: [facility];  Service: Neurosurgery;  Laterality: N/A;  Cervical four-five,Cervical five-six  Anterior cervical decompression/diskectomy, fusion, plate  BREAST BIOPSY Right    Benign   BREAST SURGERY Left 1986   lumpectomy   CARDIAC CATHETERIZATION     2011, [place] TEST  2011   CERVICAL FUSION  1988   C 6/7    COLONOSCOPY WITH PROPOFOL N/A 11/08/2017   Procedure: COLONOSCOPY WITH PROPOFOL;  Surgeon: Toledo, Benay Pike, MD;  Location: [facility];  Service: Gastroenterology;  Laterality: N/A;   ESOPHAGOGASTRODUODENOSCOPY (EGD) WITH PROPOFOL N/A 11/08/2017   Procedure: ESOPHAGOGASTRODUODENOSCOPY (EGD) WITH PROPOFOL;  Surgeon: Toledo, Benay Pike, MD;  Location: [facility];  Service: Gastroenterology;  Laterality: N/A;   EYE SURGERY     LASIK   JOINT REPLACEMENT     KNEE ARTHROPLASTY Right 10/05/2015   Procedure: COMPUTER ASSISTED TOTAL KNEE ARTHROPLASTY;  Surgeon: Dereck Leep, MD;  Location: [facility];  Service: Orthopedics;  Laterality: Right;   KNEE ARTHROSCOPY  1986   Right   KNEE ARTHROSCOPY Left  08/10/2015   Procedure: LEFT KNEE ARTHROSCOPY, CHONDROPLASTY, MEDIAL MENISECTOMY;  Surgeon: Dereck Leep, MD;  Location: [facility];  Service: Orthopedics;  Laterality: Left;   LAMINECTOMY WITH POSTERIOR LATERAL ARTHRODESIS LEVEL 2 N/A 07/16/2018   Procedure: Posterior lumbar fusion with instrumentation at L3-4 with repeat facetectomy L3-4;  Surgeon: Eustace Moore, MD;  Location: [place];  Service: Neurosurgery;  Laterality: N/A;  Posterior lumbar fusion with instrumentation at L3-4 with repeat facetectomy L3-4   LUMBAR LAMINECTOMY/DECOMPRESSION MICRODISCECTOMY N/A 10/04/2013   Procedure: LUMBAR TWO TO THREE LUMBAR LAMINECTOMY/DECOMPRESSION MICRODISCECTOMY 1 LEVEL;  Surgeon: Floyce Stakes, MD;  Location: [facility];  Service: Neurosurgery;  Laterality: N/A;  L2-3 Laminectomy   POSTERIOR LAMINECTOMY / DECOMPRESSION LUMBAR SPINE  2006   TRANSESOPHAGEAL ECHOCARDIOGRAM  2011     Social History   Tobacco Use   Smoking status: Never   Smokeless tobacco: Never  Vaping Use   Vaping Use: Never used  Substance Use Topics   Alcohol use: Yes    Alcohol/week: 14.0 standard drinks of alcohol    Types: 14 Glasses of wine per week   Drug use: No      Family History  Problem Relation Age of Onset   Leukemia Father    Diabetes Father    Heart disease Mother    Prostate cancer Neg Hx    Chronic Renal Failure Neg Hx    Breast cancer Neg Hx      Allergies  Allergen Reactions   Morphine And Related Anaphylaxis   Ace Inhibitors Swelling    Other reaction(s): Unknown   Diazepam    Tape     REVIEW OF SYSTEMS (Negative unless checked)   Constitutional: '[]'$ Weight loss  '[]'$ Fever  '[]'$ Chills Cardiac: '[]'$ Chest pain   '[]'$ Chest pressure   '[]'$ Palpitations   '[]'$ Shortness of breath when laying flat   '[]'$ Shortness of breath at rest   '[]'$ Shortness of breath with exertion. Vascular:  '[x]'$ Pain in legs with walking   '[x]'$ Pain in legs at rest   '[]'$ Pain in legs when laying flat   '[]'$ Claudication   '[]'$ Pain in feet when  walking  '[]'$ Pain in feet at rest  '[]'$ Pain in feet when laying flat   '[]'$ History of DVT   '[]'$ Phlebitis   '[x]'$ Swelling in legs   '[x]'$ Varicose veins   '[]'$ Non-healing ulcers Pulmonary:   '[]'$ Uses home oxygen   '[]'$ Productive cough   '[]'$ Hemoptysis   '[]'$ Wheeze  '[]'$ COPD   '[]'$ Asthma Neurologic:  '[]'$ Dizziness  '[]'$ Blackouts   '[]'$ Seizures   '[]'$ History of stroke   '[]'$ History of TIA  '[]'$   Aphasia   '[]'$ Temporary blindness   '[]'$ Dysphagia   '[]'$ Weakness or numbness in arms   '[]'$ Weakness or numbness in legs Musculoskeletal:  '[x]'$ Arthritis   '[]'$ Joint swelling   '[]'$ Joint pain   '[]'$ Low back pain Hematologic:  '[]'$ Easy bruising  '[]'$ Easy bleeding   '[]'$ Hypercoagulable state   '[]'$ Anemic   Gastrointestinal:  '[]'$ Blood in stool   '[]'$ Vomiting blood  '[]'$ Gastroesophageal reflux/heartburn   '[]'$ Abdominal pain Genitourinary:  '[]'$ Chronic kidney disease   '[]'$ Difficult urination  '[]'$ Frequent urination  '[]'$ Burning with urination   '[]'$ Hematuria Skin:  '[]'$ Rashes   '[]'$ Ulcers   '[]'$ Wounds Psychological:  '[]'$ History of anxiety   '[]'$  History of major depression.   Physical Examination  BP 137/66 (BP Location: Right Arm)   Pulse (!) 48   Resp 17   Ht '5\' 10"'$  (1.778 m)   Wt 263 lb 9.6 oz (119.6 kg)   BMI 37.82 kg/m  Gen:  WD/WN, NAD Head: [place]/AT, No temporalis wasting. Ear/Nose/Throat: Hearing grossly intact, nares w/o erythema or drainage Eyes: Conjunctiva clear. Sclera non-icteric Neck: Supple.  Trachea midline Pulmonary:  Good air movement, no use of accessory muscles.  Cardiac: RRR, no JVD Vascular:  Vessel Right Left  Radial Palpable Palpable           Musculoskeletal: M/S 5/5 throughout.  No deformity or atrophy. 1+ RLE edema. Neurologic: Sensation grossly intact in extremities.  Symmetrical.  Speech is fluent.  Psychiatric: Judgment intact, Mood & affect appropriate for pt's clinical situation. Dermatologic: No rashes or ulcers noted.  No cellulitis or open wounds.      Labs No results found for this or any previous visit (from the past 2160  hour(s)).  Radiology No results found.  Assessment/Plan Hypertension blood pressure control important in reducing the progression of atherosclerotic disease. On appropriate oral medications.     Diabetes mellitus type 2, uncomplicated (HCC) blood glucose control important in reducing the progression of atherosclerotic disease. Also, involved in wound healing. On appropriate medications.     Hyperlipidemia lipid control important in reducing the progression of atherosclerotic disease. Continue statin therapy  Varicose veins of leg with swelling, right Swelling is stable and largely in his ankle at this point which has severe arthritic changes and is unlikely to be get dramatically better.  He is done well after his laser ablation.  If he has worsening swelling or wants to consider a lymphedema pump or further evaluation he will call our office.  Otherwise I will see him as needed.    Leotis Pain, MD  02/08/2022 11:44 AM    This note was created with Dragon medical transcription system.  Any errors from dictation are purely unintentional

## 2022-02-08 NOTE — Assessment & Plan Note (Signed)
Swelling is stable and largely in his ankle at this point which has severe arthritic changes and is unlikely to be get dramatically better.  He is done well after his laser ablation.  If he has worsening swelling or wants to consider a lymphedema pump or further evaluation he will call our office.  Otherwise I will see him as needed.

## 2022-02-14 DIAGNOSIS — E669 Obesity, unspecified: Secondary | ICD-10-CM | POA: Diagnosis not present

## 2022-02-14 DIAGNOSIS — I351 Nonrheumatic aortic (valve) insufficiency: Secondary | ICD-10-CM | POA: Diagnosis not present

## 2022-02-14 DIAGNOSIS — E782 Mixed hyperlipidemia: Secondary | ICD-10-CM | POA: Diagnosis not present

## 2022-02-14 DIAGNOSIS — I10 Essential (primary) hypertension: Secondary | ICD-10-CM | POA: Diagnosis not present

## 2022-02-14 DIAGNOSIS — K219 Gastro-esophageal reflux disease without esophagitis: Secondary | ICD-10-CM | POA: Diagnosis not present

## 2022-02-14 DIAGNOSIS — I340 Nonrheumatic mitral (valve) insufficiency: Secondary | ICD-10-CM | POA: Diagnosis not present

## 2022-02-14 DIAGNOSIS — I4891 Unspecified atrial fibrillation: Secondary | ICD-10-CM | POA: Diagnosis not present

## 2022-02-28 ENCOUNTER — Encounter (INDEPENDENT_AMBULATORY_CARE_PROVIDER_SITE_OTHER): Payer: Self-pay

## 2022-03-15 DIAGNOSIS — E119 Type 2 diabetes mellitus without complications: Secondary | ICD-10-CM | POA: Diagnosis not present

## 2022-03-16 DIAGNOSIS — I10 Essential (primary) hypertension: Secondary | ICD-10-CM | POA: Diagnosis not present

## 2022-03-16 DIAGNOSIS — E119 Type 2 diabetes mellitus without complications: Secondary | ICD-10-CM | POA: Diagnosis not present

## 2022-03-16 DIAGNOSIS — D500 Iron deficiency anemia secondary to blood loss (chronic): Secondary | ICD-10-CM | POA: Diagnosis not present

## 2022-03-16 DIAGNOSIS — E782 Mixed hyperlipidemia: Secondary | ICD-10-CM | POA: Diagnosis not present

## 2022-03-17 DIAGNOSIS — H35363 Drusen (degenerative) of macula, bilateral: Secondary | ICD-10-CM | POA: Diagnosis not present

## 2022-03-18 DIAGNOSIS — E119 Type 2 diabetes mellitus without complications: Secondary | ICD-10-CM | POA: Diagnosis not present

## 2022-03-18 DIAGNOSIS — K648 Other hemorrhoids: Secondary | ICD-10-CM | POA: Diagnosis not present

## 2022-03-18 DIAGNOSIS — I10 Essential (primary) hypertension: Secondary | ICD-10-CM | POA: Diagnosis not present

## 2022-03-18 DIAGNOSIS — Z23 Encounter for immunization: Secondary | ICD-10-CM | POA: Diagnosis not present

## 2022-03-18 DIAGNOSIS — D500 Iron deficiency anemia secondary to blood loss (chronic): Secondary | ICD-10-CM | POA: Diagnosis not present

## 2022-03-18 DIAGNOSIS — E782 Mixed hyperlipidemia: Secondary | ICD-10-CM | POA: Diagnosis not present

## 2022-03-28 ENCOUNTER — Other Ambulatory Visit: Payer: Self-pay

## 2022-03-28 ENCOUNTER — Encounter: Payer: Self-pay | Admitting: Surgery

## 2022-03-28 ENCOUNTER — Ambulatory Visit: Payer: Medicare HMO | Admitting: Surgery

## 2022-03-28 VITALS — BP 145/81 | HR 58 | Temp 98.2°F | Ht 70.0 in | Wt 270.0 lb

## 2022-03-28 DIAGNOSIS — K648 Other hemorrhoids: Secondary | ICD-10-CM | POA: Diagnosis not present

## 2022-03-28 NOTE — Patient Instructions (Addendum)
Please see your follow up appointment listed below. You may continue using Preparation H. You may use twice daily if needed.   Be sure to increase your water intake daily due to adding more fiber (fiber supplement- Benefiber/Metamucil )  in your daily diet.   You may add Colace stool softener daily. This can be purchased at [place] or the Drug store.     High-Fiber Eating Plan Fiber, also called dietary fiber, is a type of carbohydrate. It is found foods such as fruits, vegetables, whole grains, and beans. A high-fiber diet can have many health benefits. Your health care provider may recommend a high-fiber diet to help: Prevent constipation. Fiber can make your bowel movements more regular. Lower your cholesterol. Relieve the following conditions: Inflammation of veins in the anus (hemorrhoids). Inflammation of specific areas of the digestive tract (uncomplicated diverticulosis). A problem of the large intestine, also called the colon, that sometimes causes pain and diarrhea (irritable bowel syndrome, or IBS). Prevent overeating as part of a weight-loss plan. Prevent heart disease, type 2 diabetes, and certain cancers. What are tips for following this plan? Reading food labels  Check the nutrition facts label on food products for the amount of dietary fiber. Choose foods that have 5 grams of fiber or more per serving. The goals for recommended daily fiber intake include: Men (age 73 or younger): 34-38 g. Men (over age 54): 28-34 g. Women (age 6 or younger): 25-28 g. Women (over age 60): 22-25 g. Your daily fiber goal is _____________ g. Shopping Choose whole fruits and vegetables instead of processed forms, such as apple juice or applesauce. Choose a wide variety of high-fiber foods such as avocados, lentils, oats, and kidney beans. Read the nutrition facts label of the foods you choose. Be aware of foods with added fiber. These foods often have high sugar and sodium amounts per  serving. Cooking Use whole-grain flour for baking and cooking. Cook with brown rice instead of white rice. Meal planning Start the day with a breakfast that is high in fiber, such as a cereal that contains 5 g of fiber or more per serving. Eat breads and cereals that are made with whole-grain flour instead of refined flour or white flour. Eat brown rice, bulgur wheat, or millet instead of white rice. Use beans in place of meat in soups, salads, and pasta dishes. Be sure that half of the grains you eat each day are whole grains. General information You can get the recommended daily intake of dietary fiber by: Eating a variety of fruits, vegetables, grains, nuts, and beans. Taking a fiber supplement if you are not able to take in enough fiber in your diet. It is better to get fiber through food than from a supplement. Gradually increase how much fiber you consume. If you increase your intake of dietary fiber too quickly, you may have bloating, cramping, or gas. Drink plenty of water to help you digest fiber. Choose high-fiber snacks, such as berries, raw vegetables, nuts, and popcorn. What foods should I eat? Fruits Berries. Pears. Apples. Oranges. Avocado. Prunes and raisins. Dried figs. Vegetables Sweet potatoes. Spinach. Kale. Artichokes. Cabbage. Broccoli. Cauliflower. Green peas. Carrots. Squash. Grains Whole-grain breads. Multigrain cereal. Oats and oatmeal. Brown rice. Barley. Bulgur wheat. [place]. Quinoa. Bran muffins. Popcorn. Rye wafer crackers. Meats and other proteins Navy beans, kidney beans, and pinto beans. Soybeans. Split peas. Lentils. Nuts and seeds. Dairy Fiber-fortified yogurt. Beverages Fiber-fortified soy milk. Fiber-fortified orange juice. Other foods Fiber bars. The items listed  above may not be a complete list of recommended foods and beverages. Contact a dietitian for more information. What foods should I avoid? Fruits Fruit juice. Cooked, strained  fruit. Vegetables Fried potatoes. Canned vegetables. Well-cooked vegetables. Grains White bread. Pasta made with refined flour. White rice. Meats and other proteins Fatty cuts of meat. Fried chicken or fried fish. Dairy Milk. Yogurt. Cream cheese. Sour cream. Fats and oils Butters. Beverages Soft drinks. Other foods Cakes and pastries. The items listed above may not be a complete list of foods and beverages to avoid. Talk with your dietitian about what choices are best for you. Summary Fiber is a type of carbohydrate. It is found in foods such as fruits, vegetables, whole grains, and beans. A high-fiber diet has many benefits. It can help to prevent constipation, lower blood cholesterol, aid weight loss, and reduce your risk of heart disease, diabetes, and certain cancers. Increase your intake of fiber gradually. Increasing fiber too quickly may cause cramping, bloating, and gas. Drink plenty of water while you increase the amount of fiber you consume. The best sources of fiber include whole fruits and vegetables, whole grains, nuts, seeds, and beans. This information is not intended to replace advice given to you by your health care provider. Make sure you discuss any questions you have with your health care provider. Document Revised: 08/22/2019 Document Reviewed: 08/22/2019 Elsevier Patient Education  [place]. Hemorrhoids Hemorrhoids are swollen veins in and around the rectum or anus. There are two types of hemorrhoids: Internal hemorrhoids. These occur in the veins that are just inside the rectum. They may poke through to the outside and become irritated and painful. External hemorrhoids. These occur in the veins that are outside the anus and can be felt as a painful swelling or hard lump near the anus. Most hemorrhoids do not cause serious problems, and they can be managed with home treatments such as diet and lifestyle changes. If home treatments do not help the symptoms,  procedures can be done to shrink or remove the hemorrhoids. What are the causes? This condition is caused by increased pressure in the anal area. This pressure may result from various things, including: Constipation. Straining to have a bowel movement. Diarrhea. Pregnancy. Obesity. Sitting for long periods of time. Heavy lifting or other activity that causes you to strain. Anal sex. Riding a bike for a long period of time. What are the signs or symptoms? Symptoms of this condition include: Pain. Anal itching or irritation. Rectal bleeding. Leakage of stool (feces). Anal swelling. One or more lumps around the anus. How is this diagnosed? This condition can often be diagnosed through a visual exam. Other exams or tests may also be done, such as: An exam that involves feeling the rectal area with a gloved hand (digital rectal exam). An exam of the anal canal that is done using a small tube (anoscope). A blood test, if you have lost a significant amount of blood. A test to look inside the colon using a flexible tube with a camera on the end (sigmoidoscopy or colonoscopy). How is this treated? This condition can usually be treated at home. However, various procedures may be done if dietary changes, lifestyle changes, and other home treatments do not help your symptoms. These procedures can help make the hemorrhoids smaller or remove them completely. Some of these procedures involve surgery, and others do not. Common procedures include: Rubber band ligation. Rubber bands are placed at the base of the hemorrhoids to cut off  their blood supply. Sclerotherapy. Medicine is injected into the hemorrhoids to shrink them. Infrared coagulation. A type of light energy is used to get rid of the hemorrhoids. Hemorrhoidectomy surgery. The hemorrhoids are surgically removed, and the veins that supply them are tied off. Stapled hemorrhoidopexy surgery. The surgeon staples the base of the hemorrhoid to the  rectal wall. Follow these instructions at home: Eating and drinking  Eat foods that have a lot of fiber in them, such as whole grains, beans, nuts, fruits, and vegetables. Ask your health care provider about taking products that have added fiber (fiber supplements). Reduce the amount of fat in your diet. You can do this by eating low-fat dairy products, eating less red meat, and avoiding processed foods. Drink enough fluid to keep your urine pale yellow. Managing pain and swelling  Take warm sitz baths for 20 minutes, 3-4 times a day to ease pain and discomfort. You may do this in a bathtub or using a portable sitz bath that fits over the toilet. If directed, apply ice to the affected area. Using ice packs between sitz baths may be helpful. Put ice in a plastic bag. Place a towel between your skin and the bag. Leave the ice on for 20 minutes, 2-3 times a day. General instructions Take over-the-counter and prescription medicines only as told by your health care provider. Use medicated creams or suppositories as told. Get regular exercise. Ask your health care provider how much and what kind of exercise is best for you. In general, you should do moderate exercise for at least 30 minutes on most days of the week (150 minutes each week). This can include activities such as walking, biking, or yoga. Go to the bathroom when you have the urge to have a bowel movement. Do not wait. Avoid straining to have bowel movements. Keep the anal area dry and clean. Use wet toilet paper or moist towelettes after a bowel movement. Do not sit on the toilet for long periods of time. This increases blood pooling and pain. Keep all follow-up visits as told by your health care provider. This is important. Contact a health care provider if you have: Increasing pain and swelling that are not controlled by treatment or medicine. Difficulty having a bowel movement, or you are unable to have a bowel movement. Pain or  inflammation outside the area of the hemorrhoids. Get help right away if you have: Uncontrolled bleeding from your rectum. Summary Hemorrhoids are swollen veins in and around the rectum or anus. Most hemorrhoids can be managed with home treatments such as diet and lifestyle changes. Taking warm sitz baths can help ease pain and discomfort. In severe cases, procedures or surgery can be done to shrink or remove the hemorrhoids. This information is not intended to replace advice given to you by your health care provider. Make sure you discuss any questions you have with your health care provider. Document Revised: 10/27/2020 Document Reviewed: 10/28/2020 Elsevier Patient Education  [place].

## 2022-03-28 NOTE — Progress Notes (Signed)
03/28/2022  Reason for Visit:  Bleeding hemorrhoids  Requesting Provider:  Lamonte Sakai, MD  History of Present Illness: Brandon Gibson is a 71 y.o. male presenting for evaluation of bleeding hemorrhoids.  The patient reports that he's had issues with hemorrhoids for a very long time and is unsure exactly how long.  He had a colonoscopy in 2019 that saw non-bleeding internal hemorrhoids.  He reports that he notices bleeding with almost each bowel movement.  In the past, the bleeding used to be less frequent, but now is almost daily.  He reports bowel movements once a day, but the stool is hard and he has to strain.  Denies any diarrhea.  He is on Xarelto for atrial fibrillation.  He uses Preparation H and reports that when he's more consistent with it, it decreases the frequency of bleeding.  He also reports that his labs have shown iron deficiency anemia and is getting IV iron infusions.  Past Medical History: Past Medical History:  Diagnosis Date   Arthritis    Atrial fibrillation (HCC)    BPH (benign prostatic hyperplasia)    Cancer (HCC)    HX SKIN CANCER Basal cell   Chickenpox    Clotting disorder (HCC)    Diabetes mellitus without complication (HCC)    type 2   Dysrhythmia    IRREG HEART BEAT   GERD (gastroesophageal reflux disease)    H/O pleurisy    Hypercholesteremia    Hyperlipidemia    Hypertension    Iron deficiency anemia due to chronic blood loss 09/18/2017   Lumbar stenosis    Measles    Mumps    Sleep apnea    sleep study Dr. Chancy Milroy, uses CPAP     Past Surgical History: Past Surgical History:  Procedure Laterality Date   ANTERIOR CERVICAL DECOMP/DISCECTOMY FUSION  11/25/2011   Procedure: ANTERIOR CERVICAL DECOMPRESSION/DISCECTOMY FUSION 2 LEVELS;  Surgeon: Floyce Stakes, MD;  Location: [facility];  Service: Neurosurgery;  Laterality: N/A;  Cervical four-five,Cervical five-six  Anterior cervical decompression/diskectomy, fusion, plate   BREAST BIOPSY  Right    Benign   BREAST SURGERY Left 1986   lumpectomy   CARDIAC CATHETERIZATION     2011, [place] TEST  2011   [place]   C 6/7    COLONOSCOPY WITH PROPOFOL N/A 11/08/2017   Procedure: COLONOSCOPY WITH PROPOFOL;  Surgeon: Toledo, Benay Pike, MD;  Location: [facility];  Service: Gastroenterology;  Laterality: N/A;   ESOPHAGOGASTRODUODENOSCOPY (EGD) WITH PROPOFOL N/A 11/08/2017   Procedure: ESOPHAGOGASTRODUODENOSCOPY (EGD) WITH PROPOFOL;  Surgeon: Toledo, Benay Pike, MD;  Location: [facility];  Service: Gastroenterology;  Laterality: N/A;   EYE SURGERY     LASIK   JOINT REPLACEMENT     KNEE ARTHROPLASTY Right 10/05/2015   Procedure: COMPUTER ASSISTED TOTAL KNEE ARTHROPLASTY;  Surgeon: Dereck Leep, MD;  Location: [facility];  Service: Orthopedics;  Laterality: Right;   KNEE ARTHROSCOPY  1986   Right   KNEE ARTHROSCOPY Left 08/10/2015   Procedure: LEFT KNEE ARTHROSCOPY, CHONDROPLASTY, MEDIAL MENISECTOMY;  Surgeon: Dereck Leep, MD;  Location: [facility];  Service: Orthopedics;  Laterality: Left;   LAMINECTOMY WITH POSTERIOR LATERAL ARTHRODESIS LEVEL 2 N/A 07/16/2018   Procedure: Posterior lumbar fusion with instrumentation at L3-4 with repeat facetectomy L3-4;  Surgeon: Eustace Moore, MD;  Location: [place];  Service: Neurosurgery;  Laterality: N/A;  Posterior lumbar fusion with instrumentation at L3-4 with repeat facetectomy L3-4  LUMBAR LAMINECTOMY/DECOMPRESSION MICRODISCECTOMY N/A 10/04/2013   Procedure: LUMBAR TWO TO THREE LUMBAR LAMINECTOMY/DECOMPRESSION MICRODISCECTOMY 1 LEVEL;  Surgeon: Floyce Stakes, MD;  Location: [place] [facility];  Service: Neurosurgery;  Laterality: N/A;  L2-3 Laminectomy   POSTERIOR LAMINECTOMY / DECOMPRESSION LUMBAR SPINE  2006   TRANSESOPHAGEAL ECHOCARDIOGRAM  2011    Home Medications: Prior to Admission medications   Medication Sig Start Date End Date Taking? Authorizing Provider  ACCU-CHEK AVIVA PLUS test strip   09/30/17  Yes [provider]  amiodarone (PACERONE) 200 MG tablet Take 200 mg by mouth at bedtime.   Yes [provider]  b complex vitamins tablet Take 1 tablet by mouth daily.   Yes [provider]  chlorthalidone (HYGROTON) 25 MG tablet  05/31/19  Yes [provider]  hydrALAZINE (APRESOLINE) 50 MG tablet Take 50 mg by mouth daily.    Yes [provider]  Iron-Vitamin C 65-125 MG TABS Take 1 tablet by mouth 2 (two) times daily. 02/11/19  Yes Earlie Server, MD  meloxicam (MOBIC) 15 MG tablet Take 15 mg by mouth daily. 11/15/21  Yes [provider]  metFORMIN (GLUCOPHAGE) 500 MG tablet Take 500 mg by mouth 2 (two) times daily with a meal.    Yes [provider]  metoprolol succinate (TOPROL-XL) 25 MG 24 hr tablet Take 25 mg by mouth every morning.   Yes [provider]  Omega-3 Fatty Acids (FISH OIL) 1000 MG CAPS Take 1,000 mg by mouth 2 (two) times daily.    Yes [provider]  omeprazole (PRILOSEC) 40 MG capsule Take 40 mg by mouth every morning.    Yes [provider]  potassium chloride (K-DUR) 10 MEQ tablet Take 10 mEq by mouth at bedtime.    Yes [provider]  rivaroxaban (XARELTO) 20 MG TABS tablet Take 20 mg by mouth at bedtime.   Yes [provider]  rosuvastatin (CRESTOR) 20 MG tablet Take 20 mg by mouth at bedtime.    Yes [provider]  tamsulosin (FLOMAX) 0.4 MG CAPS capsule TAKE 2 CAPSULES DAILY. 07/01/20  Yes Stoioff, Ronda Fairly, MD  trimethoprim (TRIMPEX) 100 MG tablet Take by mouth. 08/02/21  Yes [provider]    Allergies: Allergies  Allergen Reactions   Morphine And Related Anaphylaxis   Ace Inhibitors Swelling    Other reaction(s): Unknown   Diazepam    Tape     Social History:  reports that he has never smoked. He has never used smokeless tobacco. He reports current alcohol use of about 14.0 standard drinks of alcohol per week. He reports that he does  not use drugs.   Family History: Family History  Problem Relation Age of Onset   Leukemia Father    Diabetes Father    Heart disease Mother    Prostate cancer Neg Hx    Chronic Renal Failure Neg Hx    Breast cancer Neg Hx     Review of Systems: Review of Systems  Constitutional:  Negative for chills and fever.  Respiratory:  Negative for shortness of breath.   Cardiovascular:  Negative for chest pain.  Gastrointestinal:  Positive for blood in stool and constipation. Negative for abdominal pain, nausea and vomiting.  Genitourinary:  Negative for dysuria.  Skin:  Negative for rash.    Physical Exam BP (!) 145/81   Pulse (!) 58   Temp 98.2 F (36.8 C) (Oral)   Ht '5\' 10"'$  (1.778 m)   Wt 270 lb (122.5  kg)   SpO2 99%   BMI 38.74 kg/m  CONSTITUTIONAL: No acute distress HEENT:  Normocephalic, atraumatic, extraocular motion intact. RESPIRATORY:  Normal respiratory effort without pathologic use of accessory muscles. CARDIOVASCULAR: irregular rhythm, regular rate. RECTAL:  External exam reveals enlarged external hemorrhoid tissues is all columns, with left side being smaller than the rest.  No evidence of inflammation or firmness/pain.  On digital rectal exam, the internal components on the right side are enlarged more compared to the left as well.  No other masses palpable.  No gross blood. MUSCULOSKELETAL:  Normal muscle strength and tone in all four extremities.  No peripheral edema or cyanosis. NEUROLOGIC:  Motor and sensation is grossly normal.  Cranial nerves are grossly intact. PSYCH:  Alert and oriented to person, place and time. Affect is normal.   Assessment and Plan: This is a 71 y.o. male with bleeding internal hemorrhoids.  --Discussed with the patient the risk factors for hemorrhoids and in his case, although he has a daily bowel movement, he reports hard stool and straining.  This is likely the contributor to his current issues.  The xarelto does not help in that when  there's small bleeding from the hemorrhoids, it makes the bleeding more pronounced.  Discussed with him that we need to work on making his stool softer so he does not have to strain as hard.  Recommended adding fiber to his diet whether it is with foods or with supplements like Benefiber or Metamucil, to take stool softeners and add Miralax as needed.  He can continue using Preparation H since that has seemed to hep him well in the past too. --Patient will follow up with me in a month.  Discussed with him that if the bleeding has not improved, he may need surgery in the form of hemorrhoidectomy, but we can discuss this further on his next appointment.  I spent 30 minutes dedicated to the care of this patient on the date of this encounter to include pre-visit review of records, face-to-face time with the patient discussing diagnosis and management, and any post-visit coordination of care.   Melvyn Neth, [facility]

## 2022-04-05 DIAGNOSIS — I10 Essential (primary) hypertension: Secondary | ICD-10-CM | POA: Diagnosis not present

## 2022-04-14 DIAGNOSIS — I10 Essential (primary) hypertension: Secondary | ICD-10-CM | POA: Diagnosis not present

## 2022-04-14 DIAGNOSIS — Z0001 Encounter for general adult medical examination with abnormal findings: Secondary | ICD-10-CM | POA: Diagnosis not present

## 2022-04-14 DIAGNOSIS — I4891 Unspecified atrial fibrillation: Secondary | ICD-10-CM | POA: Diagnosis not present

## 2022-04-14 DIAGNOSIS — E782 Mixed hyperlipidemia: Secondary | ICD-10-CM | POA: Diagnosis not present

## 2022-04-14 DIAGNOSIS — G4733 Obstructive sleep apnea (adult) (pediatric): Secondary | ICD-10-CM | POA: Diagnosis not present

## 2022-04-14 DIAGNOSIS — E119 Type 2 diabetes mellitus without complications: Secondary | ICD-10-CM | POA: Diagnosis not present

## 2022-04-14 DIAGNOSIS — Z739 Problem related to life management difficulty, unspecified: Secondary | ICD-10-CM | POA: Diagnosis not present

## 2022-04-14 DIAGNOSIS — Z0000 Encounter for general adult medical examination without abnormal findings: Secondary | ICD-10-CM | POA: Diagnosis not present

## 2022-04-28 DIAGNOSIS — M5416 Radiculopathy, lumbar region: Secondary | ICD-10-CM | POA: Diagnosis not present

## 2022-04-28 DIAGNOSIS — M542 Cervicalgia: Secondary | ICD-10-CM | POA: Diagnosis not present

## 2022-04-28 DIAGNOSIS — Z6839 Body mass index (BMI) 39.0-39.9, adult: Secondary | ICD-10-CM | POA: Diagnosis not present

## 2022-04-29 ENCOUNTER — Encounter: Payer: Self-pay | Admitting: Surgery

## 2022-04-29 ENCOUNTER — Ambulatory Visit (INDEPENDENT_AMBULATORY_CARE_PROVIDER_SITE_OTHER): Payer: Medicare HMO | Admitting: Surgery

## 2022-04-29 VITALS — BP 146/83 | HR 87 | Temp 98.6°F | Ht 70.0 in | Wt 274.8 lb

## 2022-04-29 DIAGNOSIS — K648 Other hemorrhoids: Secondary | ICD-10-CM | POA: Diagnosis not present

## 2022-04-29 NOTE — Patient Instructions (Signed)
If you have any concerns or questions, please feel free to call our office. See follow up appointment below.   Hemorrhoids Hemorrhoids are swollen veins that may develop: In the butt (rectum). These are called internal hemorrhoids. Around the opening of the butt (anus). These are called external hemorrhoids. Hemorrhoids can cause pain, itching, or bleeding. Most of the time, they do not cause serious problems. They usually get better with diet changes, lifestyle changes, and other home treatments. What are the causes? This condition may be caused by: Having trouble pooping (constipation). Pushing hard (straining) to poop. Watery poop (diarrhea). Pregnancy. Being very overweight (obese). Sitting for long periods of time. Heavy lifting or other activity that causes you to strain. Anal sex. Riding a bike for a long period of time. What are the signs or symptoms? Symptoms of this condition include: Pain. Itching or soreness in the butt. Bleeding from the butt. Leaking poop. Swelling in the area. One or more lumps around the opening of your butt. How is this diagnosed? A doctor can often diagnose this condition by looking at the affected area. The doctor may also: Do an exam that involves feeling the area with a gloved hand (digital rectal exam). Examine the area inside your butt using a small tube (anoscope). Order blood tests. This may be done if you have lost a lot of blood. Have you get a test that involves looking inside the colon using a flexible tube with a camera on the end (sigmoidoscopy or colonoscopy). How is this treated? This condition can usually be treated at home. Your doctor may tell you to change what you eat, make lifestyle changes, or try home treatments. If these do not help, procedures can be done to remove the hemorrhoids or make them smaller. These may involve: Placing rubber bands at the base of the hemorrhoids to cut off their blood supply. Injecting medicine  into the hemorrhoids to shrink them. Shining a type of light energy onto the hemorrhoids to cause them to fall off. Doing surgery to remove the hemorrhoids or cut off their blood supply. Follow these instructions at home: Eating and drinking  Eat foods that have a lot of fiber in them. These include whole grains, beans, nuts, fruits, and vegetables. Ask your doctor about taking products that have added fiber (fibersupplements). Reduce the amount of fat in your diet. You can do this by: Eating low-fat dairy products. Eating less red meat. Avoiding processed foods. Drink enough fluid to keep your pee (urine) pale yellow. Managing pain and swelling  Take a warm-water bath (sitz bath) for 20 minutes to ease pain. Do this 3-4 times a day. You may do this in a bathtub or using a portable sitz bath that fits over the toilet. If told, put ice on the painful area. It may be helpful to use ice between your warm baths. Put ice in a plastic bag. Place a towel between your skin and the bag. Leave the ice on for 20 minutes, 2-3 times a day. General instructions Take over-the-counter and prescription medicines only as told by your doctor. Medicated creams and medicines may be used as told. Exercise often. Ask your doctor how much and what kind of exercise is best for you. Go to the bathroom when you have the urge to poop. Do not wait. Avoid pushing too hard when you poop. Keep your butt dry and clean. Use wet toilet paper or moist towelettes after pooping. Do not sit on the toilet for a long  time. Keep all follow-up visits as told by your doctor. This is important. Contact a doctor if you: Have pain and swelling that do not get better with treatment or medicine. Have trouble pooping. Cannot poop. Have pain or swelling outside the area of the hemorrhoids. Get help right away if you have: Bleeding that will not stop. Summary Hemorrhoids are swollen veins in the butt or around the opening of the  butt. They can cause pain, itching, or bleeding. Eat foods that have a lot of fiber in them. These include whole grains, beans, nuts, fruits, and vegetables. Take a warm-water bath (sitz bath) for 20 minutes to ease pain. Do this 3-4 times a day. This information is not intended to replace advice given to you by your health care provider. Make sure you discuss any questions you have with your health care provider. Document Revised: 10/27/2020 Document Reviewed: 10/28/2020 Elsevier Patient Education  [place].

## 2022-04-29 NOTE — Progress Notes (Signed)
04/29/2022  History of Present Illness: Brandon Gibson is a 71 y.o. male presenting for follow up of bleeding internal hemorrhoids.  He was last seen on 03/30/22 for this and he reported significant bleeding with bowel movements.  He is on Xarelto.  He had daily bowel movements.  On exam, was noted to have enlarged internal and external hemorrhoids, with no gross blood noted on exam.  He was recommended to start stool softeners/Miralax and fiber supplement to help prevent any constipation or hard stool.    Today, he reports improvement overall in his bleeding.  He reports some days may be worse than others, but overall there is less bleeding now compared to before.  However, the bleeding has not resolved.  He still notices the blood in the toilet bowl and toilet paper.  Denies any perianal pain.  He is taking colace and benefiber which he reports have done the best to help with the bleeding.  Past Medical History: Past Medical History:  Diagnosis Date   Arthritis    Atrial fibrillation (HCC)    BPH (benign prostatic hyperplasia)    Cancer (HCC)    HX SKIN CANCER Basal cell   Chickenpox    Clotting disorder (HCC)    Diabetes mellitus without complication (HCC)    type 2   Dysrhythmia    IRREG HEART BEAT   GERD (gastroesophageal reflux disease)    H/O pleurisy    Hypercholesteremia    Hyperlipidemia    Hypertension    Iron deficiency anemia due to chronic blood loss 09/18/2017   Lumbar stenosis    Measles    Mumps    Sleep apnea    sleep study Dr. Chancy Milroy, uses CPAP     Past Surgical History: Past Surgical History:  Procedure Laterality Date   ANTERIOR CERVICAL DECOMP/DISCECTOMY FUSION  11/25/2011   Procedure: ANTERIOR CERVICAL DECOMPRESSION/DISCECTOMY FUSION 2 LEVELS;  Surgeon: Floyce Stakes, MD;  Location: [facility];  Service: Neurosurgery;  Laterality: N/A;  Cervical four-five,Cervical five-six  Anterior cervical decompression/diskectomy, fusion, plate   BREAST BIOPSY  Right    Benign   BREAST SURGERY Left 1986   lumpectomy   CARDIAC CATHETERIZATION     2011, [place] TEST  2011   [place]   C 6/7    COLONOSCOPY WITH PROPOFOL N/A 11/08/2017   Procedure: COLONOSCOPY WITH PROPOFOL;  Surgeon: Toledo, Benay Pike, MD;  Location: [facility];  Service: Gastroenterology;  Laterality: N/A;   ESOPHAGOGASTRODUODENOSCOPY (EGD) WITH PROPOFOL N/A 11/08/2017   Procedure: ESOPHAGOGASTRODUODENOSCOPY (EGD) WITH PROPOFOL;  Surgeon: Toledo, Benay Pike, MD;  Location: [facility];  Service: Gastroenterology;  Laterality: N/A;   EYE SURGERY     LASIK   JOINT REPLACEMENT     KNEE ARTHROPLASTY Right 10/05/2015   Procedure: COMPUTER ASSISTED TOTAL KNEE ARTHROPLASTY;  Surgeon: Dereck Leep, MD;  Location: [facility];  Service: Orthopedics;  Laterality: Right;   KNEE ARTHROSCOPY  1986   Right   KNEE ARTHROSCOPY Left 08/10/2015   Procedure: LEFT KNEE ARTHROSCOPY, CHONDROPLASTY, MEDIAL MENISECTOMY;  Surgeon: Dereck Leep, MD;  Location: [facility];  Service: Orthopedics;  Laterality: Left;   LAMINECTOMY WITH POSTERIOR LATERAL ARTHRODESIS LEVEL 2 N/A 07/16/2018   Procedure: Posterior lumbar fusion with instrumentation at L3-4 with repeat facetectomy L3-4;  Surgeon: Eustace Moore, MD;  Location: [place];  Service: Neurosurgery;  Laterality: N/A;  Posterior lumbar fusion with instrumentation at L3-4 with repeat facetectomy L3-4  LUMBAR LAMINECTOMY/DECOMPRESSION MICRODISCECTOMY N/A 10/04/2013   Procedure: LUMBAR TWO TO THREE LUMBAR LAMINECTOMY/DECOMPRESSION MICRODISCECTOMY 1 LEVEL;  Surgeon: Floyce Stakes, MD;  Location: [place] [facility];  Service: Neurosurgery;  Laterality: N/A;  L2-3 Laminectomy   POSTERIOR LAMINECTOMY / DECOMPRESSION LUMBAR SPINE  2006   TRANSESOPHAGEAL ECHOCARDIOGRAM  2011    Home Medications: Prior to Admission medications   Medication Sig Start Date End Date Taking? Authorizing Provider  ACCU-CHEK AVIVA PLUS test strip   09/30/17  Yes [provider]  amiodarone (PACERONE) 200 MG tablet Take 200 mg by mouth at bedtime.   Yes [provider]  b complex vitamins tablet Take 1 tablet by mouth daily.   Yes [provider]  chlorthalidone (HYGROTON) 25 MG tablet  05/31/19  Yes [provider]  docusate (COLACE) 50 MG/5ML liquid Take by mouth daily.   Yes [provider]  hydrALAZINE (APRESOLINE) 50 MG tablet Take 50 mg by mouth daily.    Yes [provider]  Iron-Vitamin C 65-125 MG TABS Take 1 tablet by mouth 2 (two) times daily. 02/11/19  Yes Earlie Server, MD  meloxicam (MOBIC) 15 MG tablet Take 15 mg by mouth daily. 11/15/21  Yes [provider]  metFORMIN (GLUCOPHAGE) 500 MG tablet Take 500 mg by mouth 2 (two) times daily with a meal.    Yes [provider]  metoprolol succinate (TOPROL-XL) 25 MG 24 hr tablet Take 25 mg by mouth every morning.   Yes [provider]  Omega-3 Fatty Acids (FISH OIL) 1000 MG CAPS Take 1,000 mg by mouth 2 (two) times daily.    Yes [provider]  omeprazole (PRILOSEC) 40 MG capsule Take 40 mg by mouth every morning.    Yes [provider]  potassium chloride (K-DUR) 10 MEQ tablet Take 10 mEq by mouth at bedtime.    Yes [provider]  rivaroxaban (XARELTO) 20 MG TABS tablet Take 20 mg by mouth at bedtime.   Yes [provider]  rosuvastatin (CRESTOR) 20 MG tablet Take 20 mg by mouth at bedtime.    Yes [provider]  tamsulosin (FLOMAX) 0.4 MG CAPS capsule TAKE 2 CAPSULES DAILY. 07/01/20  Yes Stoioff, Ronda Fairly, MD  trimethoprim (TRIMPEX) 100 MG tablet Take by mouth. 08/02/21  Yes [provider]    Allergies: Allergies  Allergen Reactions   Morphine And Related Anaphylaxis   Ace Inhibitors Swelling    Other reaction(s): Unknown   Diazepam    Tape     Review of Systems: Review of Systems  Constitutional:  Negative for chills and fever.  Respiratory:   Negative for shortness of breath.   Cardiovascular:  Negative for chest pain.  Gastrointestinal:  Positive for blood in stool. Negative for abdominal pain, constipation, nausea and vomiting.    Physical Exam BP (!) 146/83   Pulse 87   Temp 98.6 F (37 C) (Oral)   Ht '5\' 10"'$  (1.778 m)   Wt 274 lb 12.8 oz (124.6 kg)   SpO2 98%   BMI 39.43 kg/m  CONSTITUTIONAL: No acute distress, well nourished. HEENT:  Normocephalic, atraumatic, extraocular motion intact. RESPIRATORY:  Normal respiratory effort without pathologic use of accessory muscles. CARDIOVASCULAR: Irregular rhythm, regular rate. RECTAL:  External exam reveals again enlarged external hemorrhoids, without any inflammation or thrombosis.  Digital rectal exam reveals also enlarged internal hemorrhoids, without any gross blood noted today. NEUROLOGIC:  Motor and sensation is grossly normal.  Cranial nerves are grossly intact. PSYCH:  Alert  and oriented to person, place and time. Affect is normal.   Assessment and Plan: This is a 71 y.o. male with bleeding internal hemorrhoids  --Patient reports that his bleeding has improved overall, but unfortunately there is still bleeding with bowel movements.  I discussed with the patient the potential options of continued conservative management since there has been improvement, vs proceeding to the OR for hemorrhoidectomy since there is still bleeding.  He reports that he would rather wait for any surgery.  He reports there are other health issues that are more pressing, particularly with regards to his back/neck pain, and he's worried that he may need surgery for that again.  Since there has been improvement, I think it's ok to wait and see how things continue to improve.  If things plateau, then would recommend hemorrhoidectomy. --Patient will follow up with me in a month to reassess.  I spent 20 minutes dedicated to the care of this patient on the date of this encounter to include pre-visit  review of records, face-to-face time with the patient discussing diagnosis and management, and any post-visit coordination of care.   Melvyn Neth, [facility]

## 2022-04-30 DIAGNOSIS — E119 Type 2 diabetes mellitus without complications: Secondary | ICD-10-CM | POA: Diagnosis not present

## 2022-04-30 DIAGNOSIS — I10 Essential (primary) hypertension: Secondary | ICD-10-CM | POA: Diagnosis not present

## 2022-05-24 DIAGNOSIS — M5416 Radiculopathy, lumbar region: Secondary | ICD-10-CM | POA: Diagnosis not present

## 2022-05-24 DIAGNOSIS — M542 Cervicalgia: Secondary | ICD-10-CM | POA: Diagnosis not present

## 2022-05-31 DIAGNOSIS — E119 Type 2 diabetes mellitus without complications: Secondary | ICD-10-CM | POA: Diagnosis not present

## 2022-05-31 DIAGNOSIS — I10 Essential (primary) hypertension: Secondary | ICD-10-CM | POA: Diagnosis not present

## 2022-05-31 DIAGNOSIS — E782 Mixed hyperlipidemia: Secondary | ICD-10-CM | POA: Diagnosis not present

## 2022-06-03 ENCOUNTER — Encounter: Payer: Self-pay | Admitting: Surgery

## 2022-06-03 ENCOUNTER — Ambulatory Visit: Payer: Medicare HMO | Admitting: Surgery

## 2022-06-03 ENCOUNTER — Other Ambulatory Visit: Payer: Self-pay

## 2022-06-03 VITALS — BP 111/81 | HR 76 | Temp 98.4°F | Ht 70.0 in | Wt 265.0 lb

## 2022-06-03 DIAGNOSIS — D2261 Melanocytic nevi of right upper limb, including shoulder: Secondary | ICD-10-CM | POA: Diagnosis not present

## 2022-06-03 DIAGNOSIS — I872 Venous insufficiency (chronic) (peripheral): Secondary | ICD-10-CM | POA: Diagnosis not present

## 2022-06-03 DIAGNOSIS — L814 Other melanin hyperpigmentation: Secondary | ICD-10-CM | POA: Diagnosis not present

## 2022-06-03 DIAGNOSIS — D2272 Melanocytic nevi of left lower limb, including hip: Secondary | ICD-10-CM | POA: Diagnosis not present

## 2022-06-03 DIAGNOSIS — K648 Other hemorrhoids: Secondary | ICD-10-CM | POA: Diagnosis not present

## 2022-06-03 DIAGNOSIS — L821 Other seborrheic keratosis: Secondary | ICD-10-CM | POA: Diagnosis not present

## 2022-06-03 DIAGNOSIS — D2262 Melanocytic nevi of left upper limb, including shoulder: Secondary | ICD-10-CM | POA: Diagnosis not present

## 2022-06-03 DIAGNOSIS — D2271 Melanocytic nevi of right lower limb, including hip: Secondary | ICD-10-CM | POA: Diagnosis not present

## 2022-06-03 DIAGNOSIS — D225 Melanocytic nevi of trunk: Secondary | ICD-10-CM | POA: Diagnosis not present

## 2022-06-03 NOTE — Patient Instructions (Signed)
Be sure to take the Bene fiber. Avoid constipation.    Hemorrhoids Hemorrhoids are swollen veins in and around the rectum or anus. There are two types of hemorrhoids: Internal hemorrhoids. These occur in the veins that are just inside the rectum. They may poke through to the outside and become irritated and painful. External hemorrhoids. These occur in the veins that are outside the anus and can be felt as a painful swelling or hard lump near the anus. Most hemorrhoids do not cause serious problems, and they can be managed with home treatments such as diet and lifestyle changes. If home treatments do not help the symptoms, procedures can be done to shrink or remove the hemorrhoids. What are the causes? This condition is caused by increased pressure in the anal area. This pressure may result from various things, including: Constipation. Straining to have a bowel movement. Diarrhea. Pregnancy. Obesity. Sitting for long periods of time. Heavy lifting or other activity that causes you to strain. Anal sex. Riding a bike for a long period of time. What are the signs or symptoms? Symptoms of this condition include: Pain. Anal itching or irritation. Rectal bleeding. Leakage of stool (feces). Anal swelling. One or more lumps around the anus. How is this diagnosed? This condition can often be diagnosed through a visual exam. Other exams or tests may also be done, such as: An exam that involves feeling the rectal area with a gloved hand (digital rectal exam). An exam of the anal canal that is done using a small tube (anoscope). A blood test, if you have lost a significant amount of blood. A test to look inside the colon using a flexible tube with a camera on the end (sigmoidoscopy or colonoscopy). How is this treated? This condition can usually be treated at home. However, various procedures may be done if dietary changes, lifestyle changes, and other home treatments do not help your symptoms.  These procedures can help make the hemorrhoids smaller or remove them completely. Some of these procedures involve surgery, and others do not. Common procedures include: Rubber band ligation. Rubber bands are placed at the base of the hemorrhoids to cut off their blood supply. Sclerotherapy. Medicine is injected into the hemorrhoids to shrink them. Infrared coagulation. A type of light energy is used to get rid of the hemorrhoids. Hemorrhoidectomy surgery. The hemorrhoids are surgically removed, and the veins that supply them are tied off. Stapled hemorrhoidopexy surgery. The surgeon staples the base of the hemorrhoid to the rectal wall. Follow these instructions at home: Eating and drinking  Eat foods that have a lot of fiber in them, such as whole grains, beans, nuts, fruits, and vegetables. Ask your health care provider about taking products that have added fiber (fiber supplements). Reduce the amount of fat in your diet. You can do this by eating low-fat dairy products, eating less red meat, and avoiding processed foods. Drink enough fluid to keep your urine pale yellow. Managing pain and swelling  Take warm sitz baths for 20 minutes, 3-4 times a day to ease pain and discomfort. You may do this in a bathtub or using a portable sitz bath that fits over the toilet. If directed, apply ice to the affected area. Using ice packs between sitz baths may be helpful. Put ice in a plastic bag. Place a towel between your skin and the bag. Leave the ice on for 20 minutes, 2-3 times a day. General instructions Take over-the-counter and prescription medicines only as told by your health  care provider. Use medicated creams or suppositories as told. Get regular exercise. Ask your health care provider how much and what kind of exercise is best for you. In general, you should do moderate exercise for at least 30 minutes on most days of the week (150 minutes each week). This can include activities such as  walking, biking, or yoga. Go to the bathroom when you have the urge to have a bowel movement. Do not wait. Avoid straining to have bowel movements. Keep the anal area dry and clean. Use wet toilet paper or moist towelettes after a bowel movement. Do not sit on the toilet for long periods of time. This increases blood pooling and pain. Keep all follow-up visits as told by your health care provider. This is important. Contact a health care provider if you have: Increasing pain and swelling that are not controlled by treatment or medicine. Difficulty having a bowel movement, or you are unable to have a bowel movement. Pain or inflammation outside the area of the hemorrhoids. Get help right away if you have: Uncontrolled bleeding from your rectum. Summary Hemorrhoids are swollen veins in and around the rectum or anus. Most hemorrhoids can be managed with home treatments such as diet and lifestyle changes. Taking warm sitz baths can help ease pain and discomfort. In severe cases, procedures or surgery can be done to shrink or remove the hemorrhoids. This information is not intended to replace advice given to you by your health care provider. Make sure you discuss any questions you have with your health care provider. Document Revised: 10/27/2020 Document Reviewed: 10/28/2020 Elsevier Patient Education  [place].

## 2022-06-03 NOTE — Progress Notes (Signed)
06/03/2022  History of Present Illness: Brandon Gibson is a 72 y.o. male presenting for follow up of bleeding hemorrhoids.  He was last seen on 04/29/22 at which time his symptoms had been improving with conservative measures.  He reports today that things continue to improve.  He states that he still notices bleeding with bowel movements, which happens almost daily, but the bleeding is less so compared to his last visit.  Denies any perianal pain or any constipation.  However, he reports that sometimes he misses his stool softener or fiber.  Past Medical History: Past Medical History:  Diagnosis Date   Arthritis    Atrial fibrillation (HCC)    BPH (benign prostatic hyperplasia)    Cancer (HCC)    HX SKIN CANCER Basal cell   Chickenpox    Clotting disorder (HCC)    Diabetes mellitus without complication (HCC)    type 2   Dysrhythmia    IRREG HEART BEAT   GERD (gastroesophageal reflux disease)    H/O pleurisy    Hypercholesteremia    Hyperlipidemia    Hypertension    Iron deficiency anemia due to chronic blood loss 09/18/2017   Lumbar stenosis    Measles    Mumps    Sleep apnea    sleep study Dr. Chancy Milroy, uses CPAP     Past Surgical History: Past Surgical History:  Procedure Laterality Date   ANTERIOR CERVICAL DECOMP/DISCECTOMY FUSION  11/25/2011   Procedure: ANTERIOR CERVICAL DECOMPRESSION/DISCECTOMY FUSION 2 LEVELS;  Surgeon: Floyce Stakes, MD;  Location: [facility];  Service: Neurosurgery;  Laterality: N/A;  Cervical four-five,Cervical five-six  Anterior cervical decompression/diskectomy, fusion, plate   BREAST BIOPSY Right    Benign   BREAST SURGERY Left 1986   lumpectomy   CARDIAC CATHETERIZATION     2011, [place] TEST  2011   [place]   C 6/7    COLONOSCOPY WITH PROPOFOL N/A 11/08/2017   Procedure: COLONOSCOPY WITH PROPOFOL;  Surgeon: Toledo, Benay Pike, MD;  Location: [facility];  Service: Gastroenterology;  Laterality:  N/A;   ESOPHAGOGASTRODUODENOSCOPY (EGD) WITH PROPOFOL N/A 11/08/2017   Procedure: ESOPHAGOGASTRODUODENOSCOPY (EGD) WITH PROPOFOL;  Surgeon: Toledo, Benay Pike, MD;  Location: [facility];  Service: Gastroenterology;  Laterality: N/A;   EYE SURGERY     LASIK   JOINT REPLACEMENT     KNEE ARTHROPLASTY Right 10/05/2015   Procedure: COMPUTER ASSISTED TOTAL KNEE ARTHROPLASTY;  Surgeon: Dereck Leep, MD;  Location: [facility];  Service: Orthopedics;  Laterality: Right;   KNEE ARTHROSCOPY  1986   Right   KNEE ARTHROSCOPY Left 08/10/2015   Procedure: LEFT KNEE ARTHROSCOPY, CHONDROPLASTY, MEDIAL MENISECTOMY;  Surgeon: Dereck Leep, MD;  Location: [facility];  Service: Orthopedics;  Laterality: Left;   LAMINECTOMY WITH POSTERIOR LATERAL ARTHRODESIS LEVEL 2 N/A 07/16/2018   Procedure: Posterior lumbar fusion with instrumentation at L3-4 with repeat facetectomy L3-4;  Surgeon: Eustace Moore, MD;  Location: [place];  Service: Neurosurgery;  Laterality: N/A;  Posterior lumbar fusion with instrumentation at L3-4 with repeat facetectomy L3-4   LUMBAR LAMINECTOMY/DECOMPRESSION MICRODISCECTOMY N/A 10/04/2013   Procedure: LUMBAR TWO TO THREE LUMBAR LAMINECTOMY/DECOMPRESSION MICRODISCECTOMY 1 LEVEL;  Surgeon: Floyce Stakes, MD;  Location: [place] [facility];  Service: Neurosurgery;  Laterality: N/A;  L2-3 Laminectomy   POSTERIOR LAMINECTOMY / DECOMPRESSION LUMBAR SPINE  2006   TRANSESOPHAGEAL ECHOCARDIOGRAM  2011    Home Medications: Prior to Admission medications   Medication Sig Start Date  End Date Taking? Authorizing Provider  ACCU-CHEK AVIVA PLUS test strip  09/30/17  Yes [provider]  amiodarone (PACERONE) 200 MG tablet Take 200 mg by mouth at bedtime.   Yes [provider]  b complex vitamins tablet Take 1 tablet by mouth daily.   Yes [provider]  chlorthalidone (HYGROTON) 25 MG tablet  05/31/19  Yes [provider]  docusate (COLACE) 50 MG/5ML liquid Take by mouth  daily.   Yes [provider]  hydrALAZINE (APRESOLINE) 50 MG tablet Take 50 mg by mouth daily.    Yes [provider]  Iron-Vitamin C 65-125 MG TABS Take 1 tablet by mouth 2 (two) times daily. 02/11/19  Yes Earlie Server, MD  metFORMIN (GLUCOPHAGE) 500 MG tablet Take 500 mg by mouth 2 (two) times daily with a meal.    Yes [provider]  metoprolol succinate (TOPROL-XL) 25 MG 24 hr tablet Take 25 mg by mouth every morning.   Yes [provider]  Omega-3 Fatty Acids (FISH OIL) 1000 MG CAPS Take 1,000 mg by mouth 2 (two) times daily.    Yes [provider]  omeprazole (PRILOSEC) 40 MG capsule Take 40 mg by mouth every morning.    Yes [provider]  potassium chloride (K-DUR) 10 MEQ tablet Take 10 mEq by mouth at bedtime.    Yes [provider]  rivaroxaban (XARELTO) 20 MG TABS tablet Take 20 mg by mouth at bedtime.   Yes [provider]  rosuvastatin (CRESTOR) 20 MG tablet Take 20 mg by mouth at bedtime.    Yes [provider]  tamsulosin (FLOMAX) 0.4 MG CAPS capsule TAKE 2 CAPSULES DAILY. 07/01/20  Yes Stoioff, Ronda Fairly, MD  trimethoprim (TRIMPEX) 100 MG tablet Take by mouth. 08/02/21  Yes [provider]  meloxicam (MOBIC) 15 MG tablet Take 15 mg by mouth daily. 11/15/21   [provider]    Allergies: Allergies  Allergen Reactions   Morphine And Related Anaphylaxis   Ace Inhibitors Swelling    Other reaction(s): Unknown   Diazepam    Tape     Review of Systems: Review of Systems  Constitutional:  Negative for chills and fever.  Respiratory:  Negative for shortness of breath.   Cardiovascular:  Negative for chest pain.  Gastrointestinal:  Positive for blood in stool. Negative for abdominal pain, nausea and vomiting.  Genitourinary:  Negative for dysuria.  Skin:  Negative for rash.    Physical Exam BP 111/81   Pulse 76   Temp 98.4 F (36.9 C) (Oral)   Ht '5\' 10"'$  (1.778 m)   Wt 265 lb  (120.2 kg)   SpO2 98%   BMI 38.02 kg/m  CONSTITUTIONAL: No acute distress HEENT:  Normocephalic, atraumatic, extraocular motion intact. RESPIRATORY:  Normal respiratory effort without pathologic use of accessory muscles. CARDIOVASCULAR: Irregular rhythm, regular rate RECTAL:  External exam reveals residual skin tags from external hemorrhoid flareups in the past, but no active inflammation or swelling.  Digital rectal exam reveals no gross blood, stable internal hemorrhoids.. NEUROLOGIC:  Motor and sensation is grossly normal.  Cranial nerves are grossly intact. PSYCH:  Alert and oriented to person, place and time. Affect is normal.   Assessment and Plan: This is a 72 y.o. male with bleeding internal hemorrhoids.  - The patient continues to improve although slowly and reports that the amount of bleeding that he has although still somewhat daily, is much less than it used to be.  He continues  taking Xarelto.  He has not had any episodes of significant bleeding like before.  For now he would rather continue with conservative measures and avoid surgery.  I think for now if things are actually improving we can continue with conservative measures.  Reminded patient to continue avoiding constipation and taking his stool softener and fiber supplements. - Follow-up as needed.  Precautions given.  I spent 20 minutes dedicated to the care of this patient on the date of this encounter to include pre-visit review of records, face-to-face time with the patient discussing diagnosis and management, and any post-visit coordination of care.   Melvyn Neth, [facility]

## 2022-06-07 ENCOUNTER — Telehealth: Payer: Self-pay

## 2022-06-07 NOTE — Telephone Encounter (Signed)
HTN Review Call  Clinical Lead Review Adherence gaps identified?: No Drug Therapy Problems identified?: No Assessment: Controlled  Hypertension Review (HC) Chart Review BP #1 reading (last): 111/81 on: 06/03/2022 BP #2 reading: 146/83 on: 04/29/2022 BP #3 reading: 130/80 on: 04/18/2022 Any of the last 3 BP > 140/90 mmHg?: No What recent interventions have been made by any provider to improve the patient's conditions in the last 3 months?: Consults: 05/24/22 [place]. Mey[place] NP, Leatha Gilding MD, Camelia Eng. No medication changes. 06/03/22 Olean Ree, MD General Surgery For Internal bleeding hemorrhoids. No medication changes. Has there been any documented recent hospitalizations or ED visits since last visit with Clinical Lead?: No  Adherence Review Does the [facility] have access to medication refill data?: Yes Adherence rates for STAR metric medications: Rosuvastatin 20 mg - 06/06/22 90 DS Metformin 500 mg - 05/18/22 90 DS Adherence rates for medications indicated for disease state being reviewed: None. Does the patient have >5 day gap between last estimated fill dates for any of the above medications?: No  Disease State Questions Able to connect with the Patient?: Yes Is the patient monitoring his/her BP?: No Review recommendations from CPP's note of how often patient should be checking and encourage monitoring blood pressures if patient has history of high BP.: Done What is your blood pressure goal?: 120/80 Educate patient to inform proper points on checking BP at home:: When taking resting blood pressure: sit quietly for 5 minutes, not within 30 min. of exercising, no talking., Sit with feet flat on the floor, arm at heart level., Do not drink caffeine or smoke a cigarette at least 30 min. prior to checking. What diet changes have you made to improve your Blood Pressure Control?: eating more home-cooked meals, limiting / monitoring salt intake What  exercise are you doing to improve your Blood Pressure Control?: no formal exercise  Engagement Notes Charlann Lange on 06/06/2022 02:31 PM [facility] Chart Review: 15 min 06/06/22  [facility] Assessment call time spent: 10 min 06/06/22  CPP Office Visit Documentation: 39mns 55secs 06/07/22

## 2022-06-15 ENCOUNTER — Encounter: Payer: Self-pay | Admitting: Oncology

## 2022-06-15 ENCOUNTER — Other Ambulatory Visit: Payer: Medicare HMO

## 2022-06-15 DIAGNOSIS — E119 Type 2 diabetes mellitus without complications: Secondary | ICD-10-CM

## 2022-06-15 DIAGNOSIS — I10 Essential (primary) hypertension: Secondary | ICD-10-CM

## 2022-06-20 ENCOUNTER — Ambulatory Visit: Payer: Medicare HMO | Admitting: Urology

## 2022-06-20 VITALS — BP 158/98 | HR 61 | Ht 70.0 in | Wt 261.0 lb

## 2022-06-20 DIAGNOSIS — R350 Frequency of micturition: Secondary | ICD-10-CM

## 2022-06-20 DIAGNOSIS — Z87440 Personal history of urinary (tract) infections: Secondary | ICD-10-CM

## 2022-06-20 DIAGNOSIS — N3001 Acute cystitis with hematuria: Secondary | ICD-10-CM

## 2022-06-20 DIAGNOSIS — N401 Enlarged prostate with lower urinary tract symptoms: Secondary | ICD-10-CM

## 2022-06-20 DIAGNOSIS — N411 Chronic prostatitis: Secondary | ICD-10-CM

## 2022-06-20 DIAGNOSIS — Z87438 Personal history of other diseases of male genital organs: Secondary | ICD-10-CM | POA: Diagnosis not present

## 2022-06-20 DIAGNOSIS — R399 Unspecified symptoms and signs involving the genitourinary system: Secondary | ICD-10-CM

## 2022-06-20 LAB — URINALYSIS, COMPLETE
Bilirubin, UA: NEGATIVE
Leukocytes,UA: NEGATIVE
Nitrite, UA: NEGATIVE
RBC, UA: NEGATIVE
Specific Gravity, UA: 1.015 (ref 1.005–1.030)
Urobilinogen, Ur: 1 mg/dL (ref 0.2–1.0)
pH, UA: 7 (ref 5.0–7.5)

## 2022-06-20 LAB — MICROSCOPIC EXAMINATION

## 2022-06-20 MED ORDER — TAMSULOSIN HCL 0.4 MG PO CAPS: 0.4000 mg | ORAL_CAPSULE | Freq: Every day | ORAL | 3 refills | Status: DC

## 2022-06-20 MED ORDER — TRIMETHOPRIM 100 MG PO TABS: 100.0000 mg | ORAL_TABLET | Freq: Every day | ORAL | 3 refills | Status: AC

## 2022-06-20 NOTE — Progress Notes (Signed)
06/20/2022 10:04 AM   [place] 03/22/1951 [id number]  Referring provider: Perrin Maltese, MD [address] [place],  [place] [address]  Chief Complaint  Patient presents with   Follow-up    HPI: Reviewed chart.  Patient followed by Dr. Bernardo Heater as well.  On Flomax.  Voids infrequently during the day.  I saw him in August 2022.  He was given ciprofloxacin for a month.  Did not give him Mobic.  Last culture negative.  He was dramatically better on the Cipro.  He had a normal renal ultrasound in 2019.   Patient immediately got better last time we treated him.  On Wednesday [place] and started having burning with a little bit of terminal blood in the urine.  No fever.     Patient was given ciprofloxacin 500 mg twice a day for 30 days.  Urine sent for culture.  Recognizing limitations I put him on trimethoprim 100 mg 3x11.  Reassess in 3 months to see if he still has microscopic hematuria.  If he does I will recommend work-up   Today Frequency stable.  Urine culture was positive on that day sensitive to ciprofloxacin.  No burning or blood and feels good.  On trimethoprim.  No microscopic hematuria    Today Frequency stable.  Has not been back home since last fall.  No infections or prostatitis on daily trimethoprim.  Flow stable. 50 g benign prostate    PMH: Past Medical History:  Diagnosis Date   Arthritis    Atrial fibrillation (HCC)    BPH (benign prostatic hyperplasia)    Cancer (HCC)    HX SKIN CANCER Basal cell   Chickenpox    Clotting disorder (HCC)    Diabetes mellitus without complication (HCC)    type 2   Dysrhythmia    IRREG HEART BEAT   GERD (gastroesophageal reflux disease)    H/O pleurisy    Hypercholesteremia    Hyperlipidemia    Hypertension    Iron deficiency anemia due to chronic blood loss 09/18/2017   Lumbar stenosis    Measles    Mumps    Sleep apnea    sleep study Dr. Chancy Milroy, uses CPAP    Surgical History: Past  Surgical History:  Procedure Laterality Date   ANTERIOR CERVICAL DECOMP/DISCECTOMY FUSION  11/25/2011   Procedure: ANTERIOR CERVICAL DECOMPRESSION/DISCECTOMY FUSION 2 LEVELS;  Surgeon: Floyce Stakes, MD;  [facility]: [facility];  Service: Neurosurgery;  Laterality: N/A;  Cervical four-five,Cervical five-six  Anterior cervical decompression/diskectomy, fusion, plate   BREAST BIOPSY Right    Benign   BREAST SURGERY Left 1986   lumpectomy   CARDIAC CATHETERIZATION     2011, [place] TEST  2011   [place]   C 6/7    COLONOSCOPY WITH PROPOFOL N/A 11/08/2017   Procedure: COLONOSCOPY WITH PROPOFOL;  Surgeon: Toledo, Benay Pike, MD;  Location: [facility];  Service: Gastroenterology;  Laterality: N/A;   ESOPHAGOGASTRODUODENOSCOPY (EGD) WITH PROPOFOL N/A 11/08/2017   Procedure: ESOPHAGOGASTRODUODENOSCOPY (EGD) WITH PROPOFOL;  Surgeon: Toledo, Benay Pike, MD;  Location: [facility];  Service: Gastroenterology;  Laterality: N/A;   EYE SURGERY     LASIK   JOINT REPLACEMENT     KNEE ARTHROPLASTY Right 10/05/2015   Procedure: COMPUTER ASSISTED TOTAL KNEE ARTHROPLASTY;  Surgeon: Dereck Leep, MD;  Location: [facility];  Service: Orthopedics;  Laterality: Right;   KNEE ARTHROSCOPY  1986   Right  KNEE ARTHROSCOPY Left 08/10/2015   Procedure: LEFT KNEE ARTHROSCOPY, CHONDROPLASTY, MEDIAL MENISECTOMY;  Surgeon: Dereck Leep, MD;  Location: [facility];  Service: Orthopedics;  Laterality: Left;   LAMINECTOMY WITH POSTERIOR LATERAL ARTHRODESIS LEVEL 2 N/A 07/16/2018   Procedure: Posterior lumbar fusion with instrumentation at L3-4 with repeat facetectomy L3-4;  Surgeon: Eustace Moore, MD;  Location: [place];  Service: Neurosurgery;  Laterality: N/A;  Posterior lumbar fusion with instrumentation at L3-4 with repeat facetectomy L3-4   LUMBAR LAMINECTOMY/DECOMPRESSION MICRODISCECTOMY N/A 10/04/2013   Procedure: LUMBAR TWO TO THREE LUMBAR LAMINECTOMY/DECOMPRESSION  MICRODISCECTOMY 1 LEVEL;  Surgeon: Floyce Stakes, MD;  Location: [place] [facility];  Service: Neurosurgery;  Laterality: N/A;  L2-3 Laminectomy   POSTERIOR LAMINECTOMY / DECOMPRESSION LUMBAR SPINE  2006   TRANSESOPHAGEAL ECHOCARDIOGRAM  2011    Home Medications:  Allergies as of 06/20/2022       Reactions   Morphine And Related Anaphylaxis   Ace Inhibitors Swelling   Other reaction(s): Unknown   Diazepam    Tape         Medication List        Accurate as of June 20, 2022 10:04 AM. If you have any questions, ask your nurse or doctor.          Accu-Chek Aviva Plus test strip Generic drug: glucose blood   amiodarone 200 MG tablet Commonly known as: PACERONE Take 200 mg by mouth at bedtime.   b complex vitamins tablet Take 1 tablet by mouth daily.   chlorthalidone 25 MG tablet Commonly known as: HYGROTON   docusate 50 MG/5ML liquid Commonly known as: COLACE Take by mouth daily.   Fish Oil 1000 MG Caps Take 1,000 mg by mouth 2 (two) times daily.   hydrALAZINE 50 MG tablet Commonly known as: APRESOLINE Take 50 mg by mouth daily.   Iron-Vitamin C 65-125 MG Tabs Take 1 tablet by mouth 2 (two) times daily.   meloxicam 15 MG tablet Commonly known as: MOBIC Take 15 mg by mouth daily.   metFORMIN 500 MG tablet Commonly known as: GLUCOPHAGE Take 500 mg by mouth 2 (two) times daily with a meal.   metoprolol succinate 25 MG 24 hr tablet Commonly known as: TOPROL-XL Take 25 mg by mouth every morning.   omeprazole 40 MG capsule Commonly known as: PRILOSEC Take 40 mg by mouth every morning.   potassium chloride 10 MEQ tablet Commonly known as: KLOR-CON Take 10 mEq by mouth at bedtime.   rivaroxaban 20 MG Tabs tablet Commonly known as: XARELTO Take 20 mg by mouth at bedtime.   rosuvastatin 20 MG tablet Commonly known as: CRESTOR Take 20 mg by mouth at bedtime.   tamsulosin 0.4 MG Caps capsule Commonly known as: FLOMAX TAKE 2 CAPSULES DAILY.    trimethoprim 100 MG tablet Commonly known as: TRIMPEX Take by mouth.        Allergies:  Allergies  Allergen Reactions   Morphine And Related Anaphylaxis   Ace Inhibitors Swelling    Other reaction(s): Unknown   Diazepam    Tape     Family History: Family History  Problem Relation Age of Onset   Leukemia Father    Diabetes Father    Heart disease Mother    Prostate cancer Neg Hx    Chronic Renal Failure Neg Hx    Breast cancer Neg Hx     Social History:  reports that he has never smoked. He has never used smokeless tobacco. He reports current alcohol  use of about 14.0 standard drinks of alcohol per week. He reports that he does not use drugs.  ROS:                                        Physical Exam: BP (!) 158/98   Pulse 61   Ht 5' 10"$  (1.778 m)   Wt 118.4 kg   BMI 37.45 kg/m   Constitutional:  Alert and oriented, No acute distress. HEENT: [place] AT, moist mucus membranes.  Trachea midline, no masses.   Laboratory Data: Lab Results  Component Value Date   WBC 4.2 08/12/2019   HGB 15.2 08/12/2019   HCT 43.2 08/12/2019   MCV 89.1 08/12/2019   PLT 155 08/12/2019    Lab Results  Component Value Date   CREATININE 1.14 08/12/2019    No results found for: "PSA"  Lab Results  Component Value Date   TESTOSTERONE 165 (L) 09/04/2019    Lab Results  Component Value Date   HGBA1C 5.3 09/23/2015    Urinalysis    Component Value Date/Time   COLORURINE YELLOW (A) 09/23/2015 0824   APPEARANCEUR Clear 06/14/2021 1308   LABSPEC 1.017 09/23/2015 0824   PHURINE 6.0 09/23/2015 0824   GLUCOSEU Negative 06/14/2021 1308   HGBUR NEGATIVE 09/23/2015 0824   BILIRUBINUR Negative 06/14/2021 1308   KETONESUR NEGATIVE 09/23/2015 0824   PROTEINUR Negative 06/14/2021 1308   PROTEINUR NEGATIVE 09/23/2015 0824   NITRITE Negative 06/14/2021 1308   NITRITE NEGATIVE 09/23/2015 0824   LEUKOCYTESUR Negative 06/14/2021 1308    Pertinent  Imaging:   Assessment & Plan: Both prescriptions renewed 90 x 3 and see in 1 year  1. UTI symptoms   2. Acute cystitis with hematuria  - Urinalysis, Complete   No follow-ups on file.  Reece Packer, MD  [facility] [address] [place], [place] [phone]

## 2022-06-23 ENCOUNTER — Other Ambulatory Visit: Payer: Medicare HMO

## 2022-06-23 DIAGNOSIS — I10 Essential (primary) hypertension: Secondary | ICD-10-CM | POA: Diagnosis not present

## 2022-06-23 DIAGNOSIS — E119 Type 2 diabetes mellitus without complications: Secondary | ICD-10-CM | POA: Diagnosis not present

## 2022-06-24 LAB — LIPID PANEL
Chol/HDL Ratio: 1.6 ratio (ref 0.0–5.0)
Cholesterol, Total: 111 mg/dL (ref 100–199)
HDL: 68 mg/dL (ref >39)
LDL Chol Calc (NIH): 23 mg/dL (ref 0–99)
Triglycerides: 109 mg/dL (ref 0–149)
VLDL Cholesterol Cal: 20 mg/dL (ref 5–40)

## 2022-06-24 LAB — CMP14+EGFR
ALT: 47 IU/L — ABNORMAL HIGH (ref 0–44)
AST: 38 IU/L (ref 0–40)
Albumin/Globulin Ratio: 2.3 — ABNORMAL HIGH (ref 1.2–2.2)
Albumin: 4.2 g/dL (ref 3.8–4.8)
Alkaline Phosphatase: 82 IU/L (ref 44–121)
BUN/Creatinine Ratio: 12 (ref 10–24)
BUN: 12 mg/dL (ref 8–27)
Bilirubin Total: 0.5 mg/dL (ref 0.0–1.2)
CO2: 22 mmol/L (ref 20–29)
Calcium: 9.3 mg/dL (ref 8.6–10.2)
Chloride: 99 mmol/L (ref 96–106)
Creatinine, Ser: 0.98 mg/dL (ref 0.76–1.27)
Globulin, Total: 1.8 g/dL (ref 1.5–4.5)
Glucose: 142 mg/dL — ABNORMAL HIGH (ref 70–99)
Potassium: 3.8 mmol/L (ref 3.5–5.2)
Sodium: 137 mmol/L (ref 134–144)
Total Protein: 6 g/dL (ref 6.0–8.5)
eGFR: 81 mL/min/{1.73_m2} (ref >59)

## 2022-06-24 LAB — CBC WITH DIFFERENTIAL
Basophils Absolute: 0 10*3/uL (ref 0.0–0.2)
Basos: 1 %
EOS (ABSOLUTE): 0.1 10*3/uL (ref 0.0–0.4)
Eos: 1 %
Hematocrit: 38.9 % (ref 37.5–51.0)
Hemoglobin: 12.4 g/dL — ABNORMAL LOW (ref 13.0–17.7)
Immature Grans (Abs): 0 10*3/uL (ref 0.0–0.1)
Immature Granulocytes: 0 %
Lymphocytes Absolute: 1 10*3/uL (ref 0.7–3.1)
Lymphs: 17 %
MCH: 27 pg (ref 26.6–33.0)
MCHC: 31.9 g/dL (ref 31.5–35.7)
MCV: 85 fL (ref 79–97)
Monocytes Absolute: 0.6 10*3/uL (ref 0.1–0.9)
Monocytes: 11 %
Neutrophils Absolute: 4 10*3/uL (ref 1.4–7.0)
Neutrophils: 70 %
RBC: 4.59 x10E6/uL (ref 4.14–5.80)
RDW: 16.8 % — ABNORMAL HIGH (ref 11.6–15.4)
WBC: 5.7 10*3/uL (ref 3.4–10.8)

## 2022-06-24 LAB — HEMOGLOBIN A1C
Est. average glucose Bld gHb Est-mCnc: 148 mg/dL
Hgb A1c MFr Bld: 6.8 % — ABNORMAL HIGH (ref 4.8–5.6)

## 2022-06-27 ENCOUNTER — Encounter: Payer: Self-pay | Admitting: Internal Medicine

## 2022-06-27 ENCOUNTER — Ambulatory Visit (INDEPENDENT_AMBULATORY_CARE_PROVIDER_SITE_OTHER): Payer: Medicare HMO | Admitting: Internal Medicine

## 2022-06-27 VITALS — BP 112/70 | HR 67 | Ht 70.0 in | Wt 268.0 lb

## 2022-06-27 DIAGNOSIS — E119 Type 2 diabetes mellitus without complications: Secondary | ICD-10-CM

## 2022-06-27 DIAGNOSIS — E782 Mixed hyperlipidemia: Secondary | ICD-10-CM | POA: Diagnosis not present

## 2022-06-27 DIAGNOSIS — I10 Essential (primary) hypertension: Secondary | ICD-10-CM | POA: Diagnosis not present

## 2022-06-27 DIAGNOSIS — I4811 Longstanding persistent atrial fibrillation: Secondary | ICD-10-CM | POA: Diagnosis not present

## 2022-06-27 DIAGNOSIS — G4733 Obstructive sleep apnea (adult) (pediatric): Secondary | ICD-10-CM

## 2022-06-27 LAB — POCT CBG (FASTING - GLUCOSE)-MANUAL ENTRY: Glucose Fasting, POC: 177 mg/dL — AB (ref 70–99)

## 2022-06-27 NOTE — Progress Notes (Signed)
Established Patient Office Visit  Subjective:  Patient ID: Brandon Gibson, male    DOB: 10/01/1950  Age: 72 y.o. MRN: [id number]  Chief Complaint  Patient presents with   Follow-up    3 month follow up    Patient comes in for her follow-up today.  He had labs done.  The results showed that his hemoglobin A1c has jumped up to 6.8.  Patient admits to some dietary noncompliance.  His fasting sugars are also running higher than before. He was seen by the surgeon for his hemorrhoids but he is not ready for surgery yet. Strict diet control for diabetes is emphasized.  Patient has a prescription of Ozempic injections but he has not started them yet.  He agrees to start using them from today, at  a dose of 0.25 mg/week.  He also will increase his metformin to 750 mg twice a day.     Past Medical History:  Diagnosis Date   Arthritis    Atrial fibrillation (HCC)    BPH (benign prostatic hyperplasia)    Cancer (HCC)    HX SKIN CANCER Basal cell   Chickenpox    Clotting disorder (HCC)    Diabetes mellitus without complication (HCC)    type 2   Dysrhythmia    IRREG HEART BEAT   GERD (gastroesophageal reflux disease)    H/O pleurisy    Hypercholesteremia    Hyperlipidemia    Hypertension    Iron deficiency anemia due to chronic blood loss 09/18/2017   Lumbar stenosis    Measles    Mumps    Sleep apnea    sleep study Dr. Chancy Milroy, uses CPAP    Social History   Socioeconomic History   Marital status: Widowed    Spouse name: Not on file   Number of children: Not on file   Years of education: Not on file   Highest education level: Not on file  Occupational History   Not on file  Tobacco Use   Smoking status: Never   Smokeless tobacco: Never  Vaping Use   Vaping Use: Never used  Substance and Sexual Activity   Alcohol use: Yes    Alcohol/week: 14.0 standard drinks of alcohol    Types: 14 Glasses of wine per week   Drug use: No   Sexual activity: Not on file  Other  Topics Concern   Not on file  Social History Narrative   Not on file   Social Determinants of Health   Financial Resource Strain: Not on file  Food Insecurity: Not on file  Transportation Needs: Not on file  Physical Activity: Not on file  Stress: Not on file  Social Connections: Not on file  Intimate Partner Violence: Not on file    Family History  Problem Relation Age of Onset   Leukemia Father    Diabetes Father    Heart disease Mother    Prostate cancer Neg Hx    Chronic Renal Failure Neg Hx    Breast cancer Neg Hx     Allergies  Allergen Reactions   Morphine And Related Anaphylaxis   Ace Inhibitors Swelling    Other reaction(s): Unknown   Diazepam    Tape     Review of Systems  Constitutional: Negative.   Eyes: Negative.   Respiratory: Negative.    Cardiovascular: Negative.   Gastrointestinal: Negative.   Genitourinary: Negative.   Musculoskeletal: Negative.   Neurological: Negative.   Endo/Heme/Allergies: Negative.   Psychiatric/Behavioral: Negative.  Objective:   BP 112/70   Pulse 67   Ht '5\' 10"'$  (1.778 m)   Wt 268 lb (121.6 kg)   SpO2 98%   BMI 38.45 kg/m   Vitals:   06/27/22 0903  BP: 112/70  Pulse: 67  Height: '5\' 10"'$  (1.778 m)  Weight: 268 lb (121.6 kg)  SpO2: 98%  BMI (Calculated): 38.45    Physical Exam Vitals and nursing note reviewed.  Constitutional:      Appearance: Normal appearance. He is obese.  HENT:     Head: Normocephalic.  Eyes:     Pupils: Pupils are equal, round, and reactive to light.  Cardiovascular:     Rate and Rhythm: Normal rate and regular rhythm.     Pulses: Normal pulses.     Heart sounds: Normal heart sounds.  Pulmonary:     Effort: Pulmonary effort is normal.     Breath sounds: Normal breath sounds.  Abdominal:     General: Abdomen is flat. Bowel sounds are normal.     Palpations: Abdomen is soft.  Musculoskeletal:        General: Normal range of motion.     Cervical back: Normal range  of motion and neck supple.  Skin:    General: Skin is warm and dry.  Neurological:     General: No focal deficit present.     Mental Status: He is alert and oriented to person, place, and time.  Psychiatric:        Mood and Affect: Mood normal.        Behavior: Behavior normal.      Results for orders placed or performed in visit on 06/27/22  POCT CBG (Fasting - Glucose)  Result Value Ref Range   Glucose Fasting, POC 177 (A) 70 - 99 mg/dL    Recent Results (from the past 2160 hour(s))  Urinalysis, Complete     Status: Abnormal   Collection Time: 06/20/22 10:02 AM  Result Value Ref Range   Specific Gravity, UA 1.015 1.005 - 1.030   pH, UA 7.0 5.0 - 7.5   Color, UA Yellow Yellow   Appearance Ur Clear Clear   Leukocytes,UA Negative Negative   Protein,UA 1+ (A) Negative/Trace   Glucose, UA Trace (A) Negative   Ketones, UA Trace (A) Negative   RBC, UA Negative Negative   Bilirubin, UA Negative Negative   Urobilinogen, Ur 1.0 0.2 - 1.0 mg/dL   Nitrite, UA Negative Negative   Microscopic Examination See below:   Microscopic Examination     Status: Abnormal   Collection Time: 06/20/22 10:02 AM   Urine  Result Value Ref Range   WBC, UA 0-5 0 - 5 /hpf   RBC, Urine 0-2 0 - 2 /hpf   Epithelial Cells (non renal) 0-10 0 - 10 /hpf   Casts Present (A) None seen /lpf   Cast Type Granular casts (A) N/A    Comment: Hyaline casts   Mucus, UA Present (A) Not Estab.   Bacteria, UA Few None seen/Few  CMP14+EGFR     Status: Abnormal   Collection Time: 06/23/22 10:41 AM  Result Value Ref Range   Glucose 142 (H) 70 - 99 mg/dL   BUN 12 8 - 27 mg/dL   Creatinine, Ser 0.98 0.76 - 1.27 mg/dL   eGFR 81 >59 mL/min/1.73   BUN/Creatinine Ratio 12 10 - 24   Sodium 137 134 - 144 mmol/L   Potassium 3.8 3.5 - 5.2 mmol/L   Chloride 99 96 -  106 mmol/L   CO2 22 20 - 29 mmol/L   Calcium 9.3 8.6 - 10.2 mg/dL   Total Protein 6.0 6.0 - 8.5 g/dL   Albumin 4.2 3.8 - 4.8 g/dL   Globulin, Total 1.8 1.5  - 4.5 g/dL   Albumin/Globulin Ratio 2.3 (H) 1.2 - 2.2   Bilirubin Total 0.5 0.0 - 1.2 mg/dL   Alkaline Phosphatase 82 44 - 121 IU/L   AST 38 0 - 40 IU/L   ALT 47 (H) 0 - 44 IU/L  CBC With Differential     Status: Abnormal   Collection Time: 06/23/22 10:41 AM  Result Value Ref Range   WBC 5.7 3.4 - 10.8 x10E3/uL   RBC 4.59 4.14 - 5.80 x10E6/uL   Hemoglobin 12.4 (L) 13.0 - 17.7 g/dL   Hematocrit 38.9 37.5 - 51.0 %   MCV 85 79 - 97 fL   MCH 27.0 26.6 - 33.0 pg   MCHC 31.9 31.5 - 35.7 g/dL   RDW 16.8 (H) 11.6 - 15.4 %   Neutrophils 70 Not Estab. %   Lymphs 17 Not Estab. %   Monocytes 11 Not Estab. %   Eos 1 Not Estab. %   Basos 1 Not Estab. %   Neutrophils Absolute 4.0 1.4 - 7.0 x10E3/uL   Lymphocytes Absolute 1.0 0.7 - 3.1 x10E3/uL   Monocytes Absolute 0.6 0.1 - 0.9 x10E3/uL   EOS (ABSOLUTE) 0.1 0.0 - 0.4 x10E3/uL   Basophils Absolute 0.0 0.0 - 0.2 x10E3/uL   Immature Granulocytes 0 Not Estab. %   Immature Grans (Abs) 0.0 0.0 - 0.1 x10E3/uL  Lipid Profile     Status: None   Collection Time: 06/23/22 10:41 AM  Result Value Ref Range   Cholesterol, Total 111 100 - 199 mg/dL   Triglycerides 109 0 - 149 mg/dL   HDL 68 >39 mg/dL   VLDL Cholesterol Cal 20 5 - 40 mg/dL   LDL Chol Calc (NIH) 23 0 - 99 mg/dL   Chol/HDL Ratio 1.6 0.0 - 5.0 ratio    Comment:                                   T. Chol/HDL Ratio                                             Men  Women                               1/2 Avg.Risk  3.4    3.3                                   Avg.Risk  5.0    4.4                                2X Avg.Risk  9.6    7.1                                3X Avg.Risk 23.4   11.0   HgB A1c  Status: Abnormal   Collection Time: 06/23/22 10:41 AM  Result Value Ref Range   Hgb A1c MFr Bld 6.8 (H) 4.8 - 5.6 %    Comment:          Prediabetes: 5.7 - 6.4          Diabetes: >6.4          Glycemic control for adults with diabetes: <7.0    Est. average glucose Bld gHb Est-mCnc 148  mg/dL  POCT CBG (Fasting - Glucose)     Status: Abnormal   Collection Time: 06/27/22  9:10 AM  Result Value Ref Range   Glucose Fasting, POC 177 (A) 70 - 99 mg/dL      Assessment & Plan:  Increase Metformin to 750 mg, 1po bid . Start Ozempic Injection at 0.25 mg /[place] weekly Problem List Items Addressed This Visit     Hyperlipidemia   Hypertension   Atrial fibrillation ([place])   Obstructive sleep apnea   Severe obesity (BMI 35.0-39.9) with comorbidity ([place])   Type 2 diabetes mellitus without complication ([place]) - Primary   Relevant Orders   POCT CBG (Fasting - Glucose) (Completed)    Return in about 4 weeks (around 07/25/2022).   Total time spent: 30 minutes  Perrin Maltese, MD  06/27/2022

## 2022-07-10 ENCOUNTER — Other Ambulatory Visit: Payer: Self-pay | Admitting: Internal Medicine

## 2022-07-10 DIAGNOSIS — E119 Type 2 diabetes mellitus without complications: Secondary | ICD-10-CM

## 2022-07-20 ENCOUNTER — Telehealth: Payer: Self-pay | Admitting: Internal Medicine

## 2022-07-20 NOTE — Telephone Encounter (Signed)
Patient wanted to clarify his appointment date

## 2022-07-25 ENCOUNTER — Encounter: Payer: Self-pay | Admitting: Internal Medicine

## 2022-07-25 ENCOUNTER — Ambulatory Visit (INDEPENDENT_AMBULATORY_CARE_PROVIDER_SITE_OTHER): Payer: Medicare HMO | Admitting: Internal Medicine

## 2022-07-25 VITALS — BP 122/70 | HR 86 | Ht 70.0 in | Wt 269.6 lb

## 2022-07-25 DIAGNOSIS — E782 Mixed hyperlipidemia: Secondary | ICD-10-CM | POA: Diagnosis not present

## 2022-07-25 DIAGNOSIS — I10 Essential (primary) hypertension: Secondary | ICD-10-CM

## 2022-07-25 DIAGNOSIS — E1165 Type 2 diabetes mellitus with hyperglycemia: Secondary | ICD-10-CM

## 2022-07-25 DIAGNOSIS — G4733 Obstructive sleep apnea (adult) (pediatric): Secondary | ICD-10-CM | POA: Diagnosis not present

## 2022-07-25 LAB — POCT CBG (FASTING - GLUCOSE)-MANUAL ENTRY: Glucose Fasting, POC: 169 mg/dL — AB (ref 70–99)

## 2022-07-25 NOTE — Progress Notes (Signed)
Established Patient Office Visit  Subjective:  Patient ID: Brandon Gibson, male    DOB: [birth year]  Age: 72 y.o. MRN: [id number]  Chief Complaint  Patient presents with   Follow-up    4 week follow up    Patient comes in for follow-up.  He has been advised to start using his Ozempic injections, but he has only used 2 doses for now.  Patient states that he was unsure about how to use it but is now comfortable using his injections. Patient advised to to take 2 more doses of 0.25 mg over next 2 weeks and then increase his dose to 0.5 mg/week. At next follow-up if he is tolerating it well we will increase the dose to 1 mg/week.  Patient has no other complaints today.    No other concerns at this time.   Past Medical History:  Diagnosis Date   Arthritis    Atrial fibrillation (HCC)    BPH (benign prostatic hyperplasia)    Cancer (HCC)    HX SKIN CANCER Basal cell   Chickenpox    Clotting disorder (HCC)    Diabetes mellitus without complication (HCC)    type 2   Dysrhythmia    IRREG HEART BEAT   GERD (gastroesophageal reflux disease)    H/O pleurisy    Hypercholesteremia    Hyperlipidemia    Hypertension    Iron deficiency anemia due to chronic blood loss 09/18/2017   Lumbar stenosis    Measles    Mumps    Sleep apnea    sleep study Dr. Chancy Milroy, uses CPAP    Past Surgical History:  Procedure Laterality Date   ANTERIOR CERVICAL DECOMP/DISCECTOMY FUSION  11/25/2011   Procedure: ANTERIOR CERVICAL DECOMPRESSION/DISCECTOMY FUSION 2 LEVELS;  Surgeon: Floyce Stakes, MD;  Location: [facility];  Service: Neurosurgery;  Laterality: N/A;  Cervical four-five,Cervical five-six  Anterior cervical decompression/diskectomy, fusion, plate   BREAST BIOPSY Right    Benign   BREAST SURGERY Left 1986   lumpectomy   CARDIAC CATHETERIZATION     2011, [place] TEST  2011   [place]   C 6/7    COLONOSCOPY WITH PROPOFOL N/A 11/08/2017   Procedure:  COLONOSCOPY WITH PROPOFOL;  Surgeon: Toledo, Benay Pike, MD;  Location: [facility];  Service: Gastroenterology;  Laterality: N/A;   ESOPHAGOGASTRODUODENOSCOPY (EGD) WITH PROPOFOL N/A 11/08/2017   Procedure: ESOPHAGOGASTRODUODENOSCOPY (EGD) WITH PROPOFOL;  Surgeon: Toledo, Benay Pike, MD;  Location: [facility];  Service: Gastroenterology;  Laterality: N/A;   EYE SURGERY     LASIK   JOINT REPLACEMENT     KNEE ARTHROPLASTY Right 10/05/2015   Procedure: COMPUTER ASSISTED TOTAL KNEE ARTHROPLASTY;  Surgeon: Dereck Leep, MD;  Location: [facility];  Service: Orthopedics;  Laterality: Right;   KNEE ARTHROSCOPY  1986   Right   KNEE ARTHROSCOPY Left 08/10/2015   Procedure: LEFT KNEE ARTHROSCOPY, CHONDROPLASTY, MEDIAL MENISECTOMY;  Surgeon: Dereck Leep, MD;  Location: [facility];  Service: Orthopedics;  Laterality: Left;   LAMINECTOMY WITH POSTERIOR LATERAL ARTHRODESIS LEVEL 2 N/A 07/16/2018   Procedure: Posterior lumbar fusion with instrumentation at L3-4 with repeat facetectomy L3-4;  Surgeon: Eustace Moore, MD;  Location: [place];  Service: Neurosurgery;  Laterality: N/A;  Posterior lumbar fusion with instrumentation at L3-4 with repeat facetectomy L3-4   LUMBAR LAMINECTOMY/DECOMPRESSION MICRODISCECTOMY N/A 10/04/2013   Procedure: LUMBAR TWO TO THREE LUMBAR LAMINECTOMY/DECOMPRESSION MICRODISCECTOMY 1 LEVEL;  Surgeon: Floyce Stakes, MD;  Location: [place] [facility];  Service: Neurosurgery;  Laterality: N/A;  L2-3 Laminectomy   POSTERIOR LAMINECTOMY / DECOMPRESSION LUMBAR SPINE  2006   TRANSESOPHAGEAL ECHOCARDIOGRAM  2011    Social History   Socioeconomic History   Marital status: Widowed    Spouse name: Not on file   Number of children: Not on file   Years of education: Not on file   Highest education level: Not on file  Occupational History   Not on file  Tobacco Use   Smoking status: Never   Smokeless tobacco: Never  Vaping Use   Vaping Use: Never used  Substance and Sexual Activity    Alcohol use: Yes    Alcohol/week: 14.0 standard drinks of alcohol    Types: 14 Glasses of wine per week   Drug use: No   Sexual activity: Not on file  Other Topics Concern   Not on file  Social History Narrative   Not on file   Social Determinants of Health   Financial Resource Strain: Not on file  Food Insecurity: Not on file  Transportation Needs: Not on file  Physical Activity: Not on file  Stress: Not on file  Social Connections: Not on file  Intimate Partner Violence: Not on file    Family History  Problem Relation Age of Onset   Leukemia Father    Diabetes Father    Heart disease Mother    Prostate cancer Neg Hx    Chronic Renal Failure Neg Hx    Breast cancer Neg Hx     Allergies  Allergen Reactions   Morphine And Related Anaphylaxis   Ace Inhibitors Swelling    Other reaction(s): Unknown   Diazepam    Tape     Review of Systems  Constitutional: Negative.   HENT: Negative.    Eyes: Negative.   Respiratory: Negative.    Cardiovascular: Negative.   Gastrointestinal: Negative.   Genitourinary: Negative.   Musculoskeletal: Negative.   Skin: Negative.   Neurological: Negative.   Endo/Heme/Allergies: Negative.   Psychiatric/Behavioral: Negative.         Objective:   BP 122/70   Pulse 86   Ht 5\' 10"  (1.778 m)   Wt 269 lb 9.6 oz (122.3 kg)   SpO2 98%   BMI 38.68 kg/m   Vitals:   07/25/22 0924  BP: 122/70  Pulse: 86  Height: 5\' 10"  (1.778 m)  Weight: 269 lb 9.6 oz (122.3 kg)  SpO2: 98%  BMI (Calculated): 38.68    Physical Exam Vitals and nursing note reviewed.  Constitutional:      Appearance: Normal appearance. He is obese.  Cardiovascular:     Rate and Rhythm: Normal rate and regular rhythm.     Pulses: Normal pulses.     Heart sounds: Normal heart sounds.  Pulmonary:     Effort: Pulmonary effort is normal.     Breath sounds: Normal breath sounds.  Abdominal:     General: Abdomen is flat.     Palpations: Abdomen is soft.   Musculoskeletal:        General: Normal range of motion.     Cervical back: Normal range of motion and neck supple.  Neurological:     General: No focal deficit present.     Mental Status: He is alert and oriented to person, place, and time.  Psychiatric:        Mood and Affect: Mood normal.        Behavior: Behavior normal.  Results for orders placed or performed in visit on 07/25/22  POCT CBG (Fasting - Glucose)  Result Value Ref Range   Glucose Fasting, POC 169 (A) 70 - 99 mg/dL        Assessment & Plan:  Patient will continue taking all his medications as prescribed including Ozempic. Problem List Items Addressed This Visit     Hyperlipidemia   Essential hypertension, benign   Obstructive sleep apnea   Severe obesity (BMI 35.0-39.9) with comorbidity ([place])   Type 2 diabetes mellitus with hyperglycemia, without long-term current use of insulin (HCC) - Primary   Relevant Orders   POCT CBG (Fasting - Glucose) (Completed)    Return in about 6 weeks (around 09/05/2022).   Total time spent: 20 minutes  Perrin Maltese, MD  07/25/2022

## 2022-08-16 ENCOUNTER — Encounter: Payer: Self-pay | Admitting: Cardiovascular Disease

## 2022-08-16 ENCOUNTER — Ambulatory Visit (INDEPENDENT_AMBULATORY_CARE_PROVIDER_SITE_OTHER): Payer: Medicare HMO | Admitting: Cardiovascular Disease

## 2022-08-16 VITALS — BP 128/78 | HR 81 | Ht 70.0 in | Wt 267.2 lb

## 2022-08-16 DIAGNOSIS — E782 Mixed hyperlipidemia: Secondary | ICD-10-CM

## 2022-08-16 DIAGNOSIS — I10 Essential (primary) hypertension: Secondary | ICD-10-CM | POA: Diagnosis not present

## 2022-08-16 DIAGNOSIS — I4811 Longstanding persistent atrial fibrillation: Secondary | ICD-10-CM

## 2022-08-16 NOTE — Assessment & Plan Note (Signed)
Well controlled. Continue same medications. 

## 2022-08-16 NOTE — Assessment & Plan Note (Signed)
Patient feeling well. HR well controlled. Complaint with medications.

## 2022-08-16 NOTE — Progress Notes (Signed)
Cardiology Office Note   Date:  08/16/2022   ID:  Brandon Gibson, Brandon Gibson [birth year], MRN [id number]  PCP:  Margaretann Loveless, MD  Cardiologist:  Adrian Blackwater, MD      History of Present Illness: Brandon Gibson is a 72 y.o. male who presents for  Chief Complaint  Patient presents with   Follow-up    6 month follow up    Patient in office for routine cardiac exam. Denies chest pain, shortness of breath, edema, palpitations.     Past Medical History:  Diagnosis Date   Arthritis    Atrial fibrillation    BPH (benign prostatic hyperplasia)    Cancer    HX SKIN CANCER Basal cell   Chickenpox    Clotting disorder    Diabetes mellitus without complication    type 2   Dysrhythmia    IRREG HEART BEAT   GERD (gastroesophageal reflux disease)    H/O pleurisy    Hypercholesteremia    Hyperlipidemia    Hypertension    Iron deficiency anemia due to chronic blood loss 09/18/2017   Lumbar stenosis    Measles    Mumps    Sleep apnea    sleep study Dr. Park Breed, uses CPAP     Past Surgical History:  Procedure Laterality Date   ANTERIOR CERVICAL DECOMP/DISCECTOMY FUSION  11/25/2011   Procedure: ANTERIOR CERVICAL DECOMPRESSION/DISCECTOMY FUSION 2 LEVELS;  Surgeon: Karn Cassis, MD;  Location: [facility];  Service: Neurosurgery;  Laterality: N/A;  Cervical four-five,Cervical five-six  Anterior cervical decompression/diskectomy, fusion, plate   BREAST BIOPSY Right    Benign   BREAST SURGERY Left 1986   lumpectomy   CARDIAC CATHETERIZATION     2011, [facility]   CARDIOVASCULAR STRESS TEST  2011   CERVICAL FUSION  1988   C 6/7    COLONOSCOPY WITH PROPOFOL N/A 11/08/2017   Procedure: COLONOSCOPY WITH PROPOFOL;  Surgeon: Toledo, Boykin Nearing, MD;  Location: [facility];  Service: Gastroenterology;  Laterality: N/A;   ESOPHAGOGASTRODUODENOSCOPY (EGD) WITH PROPOFOL N/A 11/08/2017   Procedure: ESOPHAGOGASTRODUODENOSCOPY (EGD) WITH PROPOFOL;  Surgeon: Toledo, Boykin Nearing, MD;   Location: [facility];  Service: Gastroenterology;  Laterality: N/A;   EYE SURGERY     LASIK   JOINT REPLACEMENT     KNEE ARTHROPLASTY Right 10/05/2015   Procedure: COMPUTER ASSISTED TOTAL KNEE ARTHROPLASTY;  Surgeon: Donato Heinz, MD;  Location: [facility];  Service: Orthopedics;  Laterality: Right;   KNEE ARTHROSCOPY  1986   Right   KNEE ARTHROSCOPY Left 08/10/2015   Procedure: LEFT KNEE ARTHROSCOPY, CHONDROPLASTY, MEDIAL MENISECTOMY;  Surgeon: Donato Heinz, MD;  Location: [facility];  Service: Orthopedics;  Laterality: Left;   LAMINECTOMY WITH POSTERIOR LATERAL ARTHRODESIS LEVEL 2 N/A 07/16/2018   Procedure: Posterior lumbar fusion with instrumentation at L3-4 with repeat facetectomy L3-4;  Surgeon: Tia Alert, MD;  Location: [facility];  Service: Neurosurgery;  Laterality: N/A;  Posterior lumbar fusion with instrumentation at L3-4 with repeat facetectomy L3-4   LUMBAR LAMINECTOMY/DECOMPRESSION MICRODISCECTOMY N/A 10/04/2013   Procedure: LUMBAR TWO TO THREE LUMBAR LAMINECTOMY/DECOMPRESSION MICRODISCECTOMY 1 LEVEL;  Surgeon: Karn Cassis, MD;  Location: [facility];  Service: Neurosurgery;  Laterality: N/A;  L2-3 Laminectomy   POSTERIOR LAMINECTOMY / DECOMPRESSION LUMBAR SPINE  2006   TRANSESOPHAGEAL ECHOCARDIOGRAM  2011     Current Outpatient Medications  Medication Sig Dispense Refill   amiodarone (PACERONE) 200 MG tablet Take 200 mg by mouth at bedtime.  b complex vitamins tablet Take 1 tablet by mouth daily.     chlorthalidone (HYGROTON) 25 MG tablet      docusate (COLACE) 50 MG/5ML liquid Take by mouth daily.     fluticasone (FLONASE) 50 MCG/ACT nasal spray Place 2 sprays into both nostrils daily.     glucose blood (ACCU-CHEK AVIVA PLUS) test strip TEST BLOOD SUGAR EVERY DAY 100 strip 3   hydrALAZINE (APRESOLINE) 50 MG tablet Take 50 mg by mouth daily.      Iron-Vitamin C 65-125 MG TABS Take 1 tablet by mouth 2 (two) times daily. 180 tablet 1   loratadine (CLARITIN) 10  MG tablet Take 10 mg by mouth daily.     metFORMIN (GLUCOPHAGE) 500 MG tablet Take 500 mg by mouth 2 (two) times daily with a meal.      metoprolol succinate (TOPROL-XL) 50 MG 24 hr tablet Take 50 mg by mouth every morning.     Omega-3 Fatty Acids (FISH OIL) 1000 MG CAPS Take 1,000 mg by mouth 2 (two) times daily.      omeprazole (PRILOSEC) 40 MG capsule Take 40 mg by mouth every morning.      potassium chloride (K-DUR) 10 MEQ tablet Take 10 mEq by mouth at bedtime.      rivaroxaban (XARELTO) 20 MG TABS tablet Take 20 mg by mouth at bedtime.     rosuvastatin (CRESTOR) 20 MG tablet Take 20 mg by mouth at bedtime.      tamsulosin (FLOMAX) 0.4 MG CAPS capsule Take 1 capsule (0.4 mg total) by mouth daily. 90 capsule 3   trimethoprim (TRIMPEX) 100 MG tablet Take 1 tablet (100 mg total) by mouth daily. 90 tablet 3   No current facility-administered medications for this visit.    Allergies:   Morphine and related, Ace inhibitors, Diazepam, and Tape    Social History:   reports that he has never smoked. He has never used smokeless tobacco. He reports current alcohol use of about 14.0 standard drinks of alcohol per week. He reports that he does not use drugs.   Family History:  family history includes Diabetes in his father; Heart disease in his mother; Leukemia in his father.    ROS:     Review of Systems  Constitutional: Negative.   HENT: Negative.    Eyes: Negative.   Respiratory: Negative.    Cardiovascular: Negative.   Gastrointestinal: Negative.   Genitourinary: Negative.   Musculoskeletal: Negative.   Skin: Negative.   Neurological: Negative.   Endo/Heme/Allergies: Negative.   Psychiatric/Behavioral: Negative.    All other systems reviewed and are negative.   All other systems are reviewed and negative.   PHYSICAL EXAM: VS:  BP 128/78   Pulse 81   Ht 5\' 10"  (1.778 m)   Wt 267 lb 3.2 oz (121.2 kg)   SpO2 98%   BMI 38.34 kg/m  , BMI Body mass index is 38.34 kg/m. Last  weight:  Wt Readings from Last 3 Encounters:  08/16/22 267 lb 3.2 oz (121.2 kg)  07/25/22 269 lb 9.6 oz (122.3 kg)  06/27/22 268 lb (121.6 kg)    Physical Exam Vitals reviewed.  Constitutional:      Appearance: Normal appearance. He is normal weight.  HENT:     Head: Normocephalic.     Nose: Nose normal.     Mouth/Throat:     Mouth: Mucous membranes are moist.  Eyes:     Pupils: Pupils are equal, round, and reactive to light.  Cardiovascular:  Rate and Rhythm: Normal rate and regular rhythm.     Pulses: Normal pulses.     Heart sounds: Normal heart sounds.  Pulmonary:     Effort: Pulmonary effort is normal.  Abdominal:     General: Abdomen is flat. Bowel sounds are normal.  Musculoskeletal:        General: Normal range of motion.     Cervical back: Normal range of motion.  Skin:    General: Skin is warm.  Neurological:     General: No focal deficit present.     Mental Status: He is alert.  Psychiatric:        Mood and Affect: Mood normal.     EKG: none today  Recent Labs: 06/23/2022: ALT 47; BUN 12; Creatinine, Ser 0.98; Hemoglobin 12.4; Potassium 3.8; Sodium 137    Lipid Panel    Component Value Date/Time   CHOL 111 06/23/2022 1041   TRIG 109 06/23/2022 1041   HDL 68 06/23/2022 1041   CHOLHDL 1.6 06/23/2022 1041   LDLCALC 23 06/23/2022 1041      Other studies Reviewed: Patient: [id number] - Sigurd Sos Biljon DOB:  [birth year]  Date:  10/08/2021 10:00 Provider: Adrian Blackwater MD Encounter: ECHO   Page 2 REASON FOR VISIT  Visit for: Echocardiogram/I 48.91  Sex:   Male        wt=261    lbs.  BP=128/68  Height=70    inches.   TESTS  Imaging: Echocardiogram:  An echocardiogram in (2-d) mode was performed and in Doppler mode with color flow velocity mapping was performed. The aortic valve cusps are abnormal 2.4   cm, flow velocity 1.30  m/s, and systolic calculated mean flow gradient 4  mmHg. Mitral valve diastolic peak flow velocity E .711     m/s  and E/A ratio 1.4. Aortic root diameter 3.8   cm. The LVOT internal diameter 3.4  cm and flow velocity was abnormal .978  m/s. LV systolic dimension 3.69  cm, diastolic 5.43  cm, posterior wall thickness 1.43   cm, fractional shortening 32 %, and EF 59.6 %. IVS thickness 1.17 cm. LA dimension 5.4 cm. Mitral Valve has Trace Regurgitation. Aortic Valve has Trace Regurgitation. Pulmonic Valve has Trace Regurgitation. Tricuspid Valve has Trace Regurgitation.     ASSESSMENT  Technically adequate study.  Normal chamber sizes.  Normal left ventricular systolic function.  Mild left ventricular hypertrophy with GRADE 1 (relaxation abnormality ) diastolic dysfunction.  Normal right ventricular systolic function.  Normal right ventricular diastolic function.  Normal left ventricular wall motion.  Normal right ventricular wall motion.  Trace pulmonary regurgitation.  Trace tricuspid regurgitation.  Normal pulmonary artery pressure.  Trace mitral regurgitation.  Fibrocalcified aortic valve with trace aortic regurgitation.  No pericardial effusion.  Mild LVH.   THERAPY   Referring physician: Laurier Nancy  Sonographer: Adrian Blackwater.   Adrian Blackwater MD  Electronically signed by: Adrian Blackwater     Date: 10/11/2021 10:46  Patient: [id number] - Marcha Dutton DOB:  [birth year]  Date:  05/11/2020 09:30 Provider: Adrian Blackwater MD Encounter: ALL ANGIOGRAMS (CTA BRAIN, CAROTIDS, RENAL ARTERIES, PE)   Page 2 REASON FOR VISIT  Referred by Dr.Cassaundra Rasch [place] Flakes.   TESTS  Imaging: Computed Tomographic Angiography:  Cardiac multidetector CT was performed paying particular attention to the coronary arteries for the diagnosis of: Chest Pain. Diagnostic Drugs:  Administered iohexol (Omnipaque) through an antecubital vein and images from the examination were analyzed for the presence and extent of  coronary artery disease, using 3D image processing software. 100 mL of non-ionic contrast (Omnipaque) was  used.   TEST CONCLUSIONS  Quality of study: Good  1-Calcium score: 0  2-Right dominant system.  3-Normal coronaries.   Adrian Blackwater MD  Electronically signed by: Adrian Blackwater     Date: 05/12/2020 10:40  Patient: [id number] - Marcha Dutton DOB:  Dec 15, 1950  Date:  03/30/2020 07:30 Provider: Adrian Blackwater MD Encounter: NUCLEAR STRESS TEST   Page 1 TESTS   [facility] [address] [place], [place] [address] [phone] STUDY:  Gated Stress / Rest Myocardial Perfusion Imaging Tomographic (SPECT) Including attenuation correction Wall Motion, Left Ventricular Ejection Fraction By Gated Technique.Persantine Stress Test. SEX: Male       WEIGHT: 245 lbs  HEIGHT: 70 in                   ARMS UP: YES/NO                                                                                                                                                                                REFERRING PHYSICIAN: Dr.Jashayla Glatfelter [place] Flakes                                                                                                                                                                                                                       INDICATION FOR STUDY: CP  TECHNIQUE:  Approximately 20 minutes following the intravenous administration of 10.3 mCi of Tc-38m Sestamibi after stress testing in a reclined supine position with arms above their head if able to do so, gated SPECT imaging of the heart was performed. After about a 2hr break, the patient was injected intravenously with 30.0 mCi of Tc-59m Sestamibi.  Approximately 45 minutes later in the same position as stress imaging SPECT rest imaging of the heart was performed.  STRESS BY:  Adrian Blackwater, MD PROTOCOL:  Persantine    DOSE ADMIN: 10 CC       ROUTE OF ADMINISTRATION: IV                                                                            MAX PRED HR: 149                     85%:  127              75%:  112                                                                                                                  RESTING BP: 140/70  RESTING HR: 52  PEAK BP: 140/70  PEAK HR: 63                                                                    EXERCISE DURATION:    4 min injection                                            REASON FOR TEST TERMINATION:    Protocol end  SYMPTOMS:   None                                                                                                                                                                                                          EKG RESULTS: Sinus bradycardia. 50/min. With PVC. No changes with persantine.                                                              IMAGE QUALITY:  Good  PERFUSION/WALL MOTION FINDINGS: EF = 56%. Small mild fixed mid anteroseptal wall defect, normal wall motion.                                                                          IMPRESSION: Equivocal stress test with normal LVEF.                                                                                                                                                                                                                                                                                        Adrian Blackwater, MD Stress  Interpreting Physician / Nuclear Interpreting Physician  Adrian Blackwater MD  Electronically signed by: Adrian Blackwater     Date: 04/03/2020 12:17   ASSESSMENT AND PLAN:    ICD-10-CM   1. Longstanding persistent atrial fibrillation  I48.11     2. Essential hypertension, benign  I10     3. Mixed hyperlipidemia  E78.2     4. Severe obesity (BMI 35.0-39.9) with comorbidity  E66.01        Problem List Items Addressed This Visit       Cardiovascular and Mediastinum   Essential hypertension, benign    Well controlled. Continue same medications.       Atrial fibrillation - Primary    Patient feeling well. HR well controlled. Complaint with medications.         Other   Hyperlipidemia    06/23/22 LDL 23, continue rosuvastatin 20 mg daily.       Severe obesity (BMI 35.0-39.9) with comorbidity    The patient is asked to make an attempt to improve diet and exercise patterns to aid in medical management of this problem.        Disposition:   Return in about 6 months (around 02/15/2023).    Total time spent: 30 minutes  Signed,  Adrian Blackwater,  MD  08/16/2022 9:13 AM    [facility]

## 2022-08-16 NOTE — Assessment & Plan Note (Signed)
The patient is asked to make an attempt to improve diet and exercise patterns to aid in medical management of this problem.  

## 2022-08-16 NOTE — Assessment & Plan Note (Signed)
06/23/22 LDL 23, continue rosuvastatin 20 mg daily.

## 2022-08-31 ENCOUNTER — Telehealth: Payer: Self-pay | Admitting: Internal Medicine

## 2022-08-31 NOTE — Telephone Encounter (Signed)
Patient left VM requesting a call back from [place]

## 2022-09-01 NOTE — Telephone Encounter (Signed)
Patient called again requesting a call back from [place]

## 2022-09-01 NOTE — Telephone Encounter (Signed)
Patient just needed to know about his labs and If he was too early

## 2022-09-05 ENCOUNTER — Ambulatory Visit (INDEPENDENT_AMBULATORY_CARE_PROVIDER_SITE_OTHER): Payer: Medicare HMO | Admitting: Internal Medicine

## 2022-09-05 ENCOUNTER — Encounter: Payer: Self-pay | Admitting: Internal Medicine

## 2022-09-05 VITALS — BP 128/70 | HR 68 | Ht 70.0 in | Wt 260.0 lb

## 2022-09-05 DIAGNOSIS — I10 Essential (primary) hypertension: Secondary | ICD-10-CM

## 2022-09-05 DIAGNOSIS — E782 Mixed hyperlipidemia: Secondary | ICD-10-CM | POA: Diagnosis not present

## 2022-09-05 DIAGNOSIS — E1165 Type 2 diabetes mellitus with hyperglycemia: Secondary | ICD-10-CM

## 2022-09-05 DIAGNOSIS — G4733 Obstructive sleep apnea (adult) (pediatric): Secondary | ICD-10-CM | POA: Diagnosis not present

## 2022-09-05 LAB — POCT CBG (FASTING - GLUCOSE)-MANUAL ENTRY: Glucose Fasting, POC: 104 mg/dL — AB (ref 70–99)

## 2022-09-05 NOTE — Progress Notes (Signed)
Established Patient Office Visit  Subjective:  Patient ID: Brandon Gibson, male    DOB: 09/16/1950  Age: 72 y.o. MRN: [id number]  Chief Complaint  Patient presents with   Follow-up    6 week follow up    Patient comes in for his follow-up today.  He has recently started using his Ozempic injections.  Initially started at 0.25 mg/week for few weeks and then resolved 0.5 mg/week.  He has lost considerable amount of weight in the last 6 weeks.  He has no complaints of nausea, vomiting or abdominal pain.  So he is tolerating it quite well with an effective result.  Will continue at 0.5 mg.  And continue to monitor for weight loss and any side effects.    No other concerns at this time.   Past Medical History:  Diagnosis Date   Arthritis    Atrial fibrillation (HCC)    BPH (benign prostatic hyperplasia)    Cancer (HCC)    HX SKIN CANCER Basal cell   Chickenpox    Clotting disorder (HCC)    Diabetes mellitus without complication (HCC)    type 2   Dysrhythmia    IRREG HEART BEAT   GERD (gastroesophageal reflux disease)    H/O pleurisy    Hypercholesteremia    Hyperlipidemia    Hypertension    Iron deficiency anemia due to chronic blood loss 09/18/2017   Lumbar stenosis    Measles    Mumps    Sleep apnea    sleep study Dr. Park Breed, uses CPAP    Past Surgical History:  Procedure Laterality Date   ANTERIOR CERVICAL DECOMP/DISCECTOMY FUSION  11/25/2011   Procedure: ANTERIOR CERVICAL DECOMPRESSION/DISCECTOMY FUSION 2 LEVELS;  Surgeon: Karn Cassis, MD;  Location: [facility];  Service: Neurosurgery;  Laterality: N/A;  Cervical four-five,Cervical five-six  Anterior cervical decompression/diskectomy, fusion, plate   BREAST BIOPSY Right    Benign   BREAST SURGERY Left 1986   lumpectomy   CARDIAC CATHETERIZATION     2011, [facility]   CARDIOVASCULAR STRESS TEST  2011   CERVICAL FUSION  1988   C 6/7    COLONOSCOPY WITH PROPOFOL N/A 11/08/2017   Procedure: COLONOSCOPY WITH  PROPOFOL;  Surgeon: Toledo, Boykin Nearing, MD;  Location: [facility];  Service: Gastroenterology;  Laterality: N/A;   ESOPHAGOGASTRODUODENOSCOPY (EGD) WITH PROPOFOL N/A 11/08/2017   Procedure: ESOPHAGOGASTRODUODENOSCOPY (EGD) WITH PROPOFOL;  Surgeon: Toledo, Boykin Nearing, MD;  Location: [facility];  Service: Gastroenterology;  Laterality: N/A;   EYE SURGERY     LASIK   JOINT REPLACEMENT     KNEE ARTHROPLASTY Right 10/05/2015   Procedure: COMPUTER ASSISTED TOTAL KNEE ARTHROPLASTY;  Surgeon: Donato Heinz, MD;  Location: [facility];  Service: Orthopedics;  Laterality: Right;   KNEE ARTHROSCOPY  1986   Right   KNEE ARTHROSCOPY Left 08/10/2015   Procedure: LEFT KNEE ARTHROSCOPY, CHONDROPLASTY, MEDIAL MENISECTOMY;  Surgeon: Donato Heinz, MD;  Location: [facility];  Service: Orthopedics;  Laterality: Left;   LAMINECTOMY WITH POSTERIOR LATERAL ARTHRODESIS LEVEL 2 N/A 07/16/2018   Procedure: Posterior lumbar fusion with instrumentation at L3-4 with repeat facetectomy L3-4;  Surgeon: Tia Alert, MD;  Location: [facility] OR;  Service: Neurosurgery;  Laterality: N/A;  Posterior lumbar fusion with instrumentation at L3-4 with repeat facetectomy L3-4   LUMBAR LAMINECTOMY/DECOMPRESSION MICRODISCECTOMY N/A 10/04/2013   Procedure: LUMBAR TWO TO THREE LUMBAR LAMINECTOMY/DECOMPRESSION MICRODISCECTOMY 1 LEVEL;  Surgeon: Karn Cassis, MD;  Location: [facility];  Service: Neurosurgery;  Laterality: N/A;  L2-3 Laminectomy   POSTERIOR LAMINECTOMY / DECOMPRESSION LUMBAR SPINE  2006   TRANSESOPHAGEAL ECHOCARDIOGRAM  2011    Social History   Socioeconomic History   Marital status: Widowed    Spouse name: Not on file   Number of children: Not on file   Years of education: Not on file   Highest education level: Not on file  Occupational History   Not on file  Tobacco Use   Smoking status: Never   Smokeless tobacco: Never  Vaping Use   Vaping Use: Never used  Substance and Sexual Activity   Alcohol use:  Yes    Alcohol/week: 14.0 standard drinks of alcohol    Types: 14 Glasses of wine per week   Drug use: No   Sexual activity: Not on file  Other Topics Concern   Not on file  Social History Narrative   Not on file   Social Determinants of Health   Financial Resource Strain: Not on file  Food Insecurity: Not on file  Transportation Needs: Not on file  Physical Activity: Not on file  Stress: Not on file  Social Connections: Not on file  Intimate Partner Violence: Not on file    Family History  Problem Relation Age of Onset   Leukemia Father    Diabetes Father    Heart disease Mother    Prostate cancer Neg Hx    Chronic Renal Failure Neg Hx    Breast cancer Neg Hx     Allergies  Allergen Reactions   Morphine And Related Anaphylaxis   Ace Inhibitors Swelling    Other reaction(s): Unknown   Diazepam    Tape     Review of Systems  Constitutional:  Positive for weight loss (with Ozempic). Negative for chills and fever.  HENT: Negative.    Eyes: Negative.   Respiratory: Negative.    Cardiovascular: Negative.   Gastrointestinal:  Negative for abdominal pain, constipation, diarrhea, heartburn, nausea and vomiting.  Genitourinary: Negative.   Musculoskeletal: Negative.   Neurological: Negative.   Psychiatric/Behavioral: Negative.         Objective:   BP 128/70   Pulse 68   Ht 5\' 10"  (1.778 m)   Wt 260 lb (117.9 kg)   SpO2 95%   BMI 37.31 kg/m   Vitals:   09/05/22 0948  BP: 128/70  Pulse: 68  Height: 5\' 10"  (1.778 m)  Weight: 260 lb (117.9 kg)  SpO2: 95%  BMI (Calculated): 37.31    Physical Exam Vitals reviewed.  Constitutional:      Appearance: Normal appearance.  HENT:     Head: Normocephalic.  Cardiovascular:     Rate and Rhythm: Normal rate and regular rhythm.     Pulses: Normal pulses.     Heart sounds: Normal heart sounds. No murmur heard. Pulmonary:     Effort: Pulmonary effort is normal.     Breath sounds: Normal breath sounds. No  wheezing or rhonchi.  Abdominal:     General: Bowel sounds are normal.     Palpations: Abdomen is soft.     Tenderness: There is no abdominal tenderness.  Musculoskeletal:        General: Normal range of motion.     Cervical back: Normal range of motion.  Skin:    General: Skin is warm.  Neurological:     General: No focal deficit present.     Mental Status: He is alert.  Psychiatric:        Mood  and Affect: Mood normal.        Behavior: Behavior normal.      Results for orders placed or performed in visit on 09/05/22  POCT CBG (Fasting - Glucose)  Result Value Ref Range   Glucose Fasting, POC 104 (A) 70 - 99 mg/dL        Assessment & Plan:  Patient advised to continue taking his medications along with Ozempic and 0.5 mg/week. Problem List Items Addressed This Visit     Hyperlipidemia   Essential hypertension, benign   Obstructive sleep apnea   Severe obesity (BMI 35.0-39.9) with comorbidity (HCC)   Type 2 diabetes mellitus with hyperglycemia, without long-term current use of insulin (HCC) - Primary   Relevant Orders   POCT CBG (Fasting - Glucose) (Completed)    Return in about 6 weeks (around 10/17/2022).   Total time spent: 25 minutes  Margaretann Loveless, MD  09/05/2022

## 2022-09-19 ENCOUNTER — Telehealth: Payer: Self-pay

## 2022-09-19 ENCOUNTER — Other Ambulatory Visit: Payer: Self-pay | Admitting: Internal Medicine

## 2022-09-19 DIAGNOSIS — E1165 Type 2 diabetes mellitus with hyperglycemia: Secondary | ICD-10-CM

## 2022-09-19 MED ORDER — SEMAGLUTIDE(0.25 OR 0.5MG/DOS) 2 MG/3ML ~~LOC~~ SOPN: 0.5000 mg | PEN_INJECTOR | SUBCUTANEOUS | 1 refills | Status: DC

## 2022-09-19 NOTE — Telephone Encounter (Signed)
Pt called regarding rx ozempic, he is on 0.5mg  dose & asked if we can send a 30 day supply with 1 refill to [facility] for him? Said it's cheaper for him to get rx there, please advise

## 2022-09-30 DIAGNOSIS — I10 Essential (primary) hypertension: Secondary | ICD-10-CM | POA: Diagnosis not present

## 2022-09-30 DIAGNOSIS — E119 Type 2 diabetes mellitus without complications: Secondary | ICD-10-CM | POA: Diagnosis not present

## 2022-09-30 DIAGNOSIS — E782 Mixed hyperlipidemia: Secondary | ICD-10-CM | POA: Diagnosis not present

## 2022-10-03 ENCOUNTER — Telehealth: Payer: Medicare HMO

## 2022-10-04 ENCOUNTER — Ambulatory Visit: Payer: Medicare HMO

## 2022-10-05 ENCOUNTER — Other Ambulatory Visit: Payer: Self-pay | Admitting: Internal Medicine

## 2022-10-05 ENCOUNTER — Other Ambulatory Visit: Payer: Self-pay | Admitting: Cardiovascular Disease

## 2022-10-05 NOTE — Chronic Care Management (AMB) (Signed)
Follow Up Pharmacist Visit (CCM)  Brandon Gibson,Brandon Gibson  72 years, Male  DOB: 14-Jan-1951  M:   __________________________________________________ Clinical Summary Next Pharmacist Follow Up: FPO in September Next AWV: to be scheduled Summary for PCP:  Recommended him to continue 0.5mg  dose (instead of 2 dose of 0.5mg ), due to nausea and vomiting, however he prefers to talk to PCP before reducing. He is concerned he will not get the maximum benefit at reduced dose. - We will work on Ozempic PAP - pt will call when he gets his tax returns from son - once his dose is stabilized - He requests refills for loratadine and Flonase . - DM review in July, [place] in September . Patient's Chronic Conditions: Hypertension (HTN), Atrial Fibrillation, Gastroesophageal Reflux Disease (GERD), Diabetes (DM), Hyperlipidemia/Dyslipidemia (HLD), Other List Other Conditions (separated by comma): Obstructive Sleep apnea, Arthritis, Hyperglycemia, Spinal Stenosis of lumbar region, Obesity, Iron deficiency anemia, Lumbar stenosis,  . Pre-Call Questions Date:: 09/30/2022 Time:: AM Outcome:: Successful Confirmed appointment date/time with patient/caregiver?: Yes Date/time of the appointment: 6/4 at 2p Visit type: Phone Patient/Caregiver instructed to bring medications to appointment: Yes What, if any, problems do you have getting your medications from the pharmacy?: None What is your top health concern to discuss at your upcoming visit?: He is experiencing some Nausea that he feels is associated with his Ozempic. He is pleased  with the results of Ozempic though. Have you seen any other providers since your last visit?: No . Disease Assessments Visit Date Visit Completed on: 10/04/2022 Subjective Information Subjective: He went in for a script for allergies. He wants to get a refill - loratadine and Flonase - [facility] He is T2DM: Dr.N. Welton Flakes prescribed Ozempic, he is losing weight too. He might need PAP on Ozempic  which he is taking for DM. The medication makes him extremely nauseous and throw up. He was prepared for the side effect of Ozempic. He is down 17lbs. He was started on March 2024 Lifestyle habits: Diet: Fairly healthy - twice a day - baked potatoes, baked chips, vegetables. Morning meal is granola/cereal with yogurt. Afternoon may be saltine crackers and meal at night. He was on 0.25mg  for 4 weeks and 0.5mg  for 4 more weeks. SDOH: Accountable Health Communities Health-Related Social Needs Screening Tool ([employer]) SDOH questions were documented in [employer] within the past 12 months or since hospitalization?: Yes Medication Adherence Does the [facility] have access to medication refill history?: No . Hypertension (HTN) Most Recent BP: 128/70 Most Recent HR: 68 taken on: 09/05/2022 Care Gap: Need BP documented or last BP 140/90 or higher: Addressed Assessed today?: No Drug: Hydralazine 50mg - 1 tablet daily.  Drug: Metoprolol Succinate 50mg - 1 tablet every morning.  Marland Kitchen Hyperlipidemia/Dyslipidemia (HLD) Last Lipid panel on: 06/23/2022 TC (Goal<200): 111 LDL: 23 HDL (Goal>40): 68 TG (Goal<150): 109 ASCVD 10-year risk?is:: N/A due to lab values outside the range Assessed today?: No Drug: Rosuvastatin 20mg - 1 tablet at bedtime. Drug: Omega 3 1000mg - 1 tablet two times daily.  . Diabetes (DM) Most recent A1C: 6.8 taken on: 06/24/2022 Most Recent GFR: 81 taken on: 06/23/2022 Type: 2 Care Gap: Statin therapy needed: Addressed Care Gap: Need A1c documented or last A1c > 9 %: Addressed Care Gap: Need eye exam documented in EMR or by claim: Needs to be addressed Care Gap: Need eGFR and uACR for kidney health evaluation: Addressed Assessed today?: Yes Goal A1C: < 7.0 % Type: 2 Is Patient taking statin medication: Yes Is patient taking ACEi / ARB?: No Reason patient  is not taking ACEi / ARB: anaphylaxis Blood glucose  monitoring: Glucometer How often does patient check Blood Sugars?: once a day  Recent fasting Blood sugar reading: 130 Has patient experienced any hypoglycemic episodes?: None recently Additional Info: has Diabetic Neuropathy, self-inspection, doesn't go to podiatrist We discussed: How to recognize and treat signs of hypoglycemia., Proper diabetic foot care including daily foot exams and wearing closed toe shoes., Scheduling a yearly eye exam Assessment:: Uncontrolled Drug: Metformin 500mg - 1 tablet two times daily with meals.  Pharmacist Assessment: Appropriate, Query Effectiveness Drug: Semaglutide 0.5mg , 1 subcutaneous injection weekly. Pharmacist Assessment: Appropriate, Query Effectiveness Plan/Follow up: Recommend Modify: Reduce dose of Ozempic to 0.5mg  weekly instead of 2 x 0.5mg  Counsel: Patient is experiencing nausea, however he wants to persevere because of the benefits of weight loss and glucose control GERD Assessment Assessed today?: No Drug: Omeprazole 40mg - 1 tablet every morning.  Atrial Fibrillation Most recent INR: N/A Previous INR: N/A Assessed today?: No Drug: Xarelto 20mg - 1 tablet at bedtime.  Drug: Amiodarone 200mg - 1 tablet at bedtime.  . General Disease Assessment Assessed today?: No Preventative Health Care Gap: Colorectal cancer screening: Addressed Care Gap: Breast cancer screening: Addressed Care Gap: Annual Wellness Visit (AWV): Needs to be addressed Engagement Notes Janee Morn, joni on 09/28/2022 03:12 PM HC Billed time: CCM: 0:44:00 Pharmacist Interventions Intervention Details Pharmacist Interventions discussed: Yes Modified Therapy: Dose related undesirable effect . Lynann Bologna, PharmD Chart review Televisit  Documentation 10/05/22: 

## 2022-10-06 ENCOUNTER — Telehealth: Payer: Self-pay

## 2022-10-06 NOTE — Telephone Encounter (Signed)
Pt called regarding rx Loratadine, asked if he shouldn't take rx for long periods of time due to rx being sent with no refills? Said rx has helped with allergies so he just wanted to check please advise

## 2022-10-07 ENCOUNTER — Other Ambulatory Visit: Payer: Self-pay | Admitting: Internal Medicine

## 2022-10-07 DIAGNOSIS — J301 Allergic rhinitis due to pollen: Secondary | ICD-10-CM

## 2022-10-07 MED ORDER — LORATADINE 10 MG PO TABS: 10.0000 mg | ORAL_TABLET | Freq: Every day | ORAL | 3 refills | Status: DC

## 2022-10-13 DIAGNOSIS — E1165 Type 2 diabetes mellitus with hyperglycemia: Secondary | ICD-10-CM | POA: Diagnosis not present

## 2022-10-13 DIAGNOSIS — G8929 Other chronic pain: Secondary | ICD-10-CM | POA: Diagnosis not present

## 2022-10-13 DIAGNOSIS — Z96653 Presence of artificial knee joint, bilateral: Secondary | ICD-10-CM | POA: Diagnosis not present

## 2022-10-13 DIAGNOSIS — M25562 Pain in left knee: Secondary | ICD-10-CM | POA: Diagnosis not present

## 2022-10-17 ENCOUNTER — Ambulatory Visit: Payer: Medicare HMO | Admitting: Internal Medicine

## 2022-10-19 ENCOUNTER — Other Ambulatory Visit: Payer: Self-pay | Admitting: Internal Medicine

## 2022-10-25 ENCOUNTER — Other Ambulatory Visit: Payer: Self-pay | Admitting: Internal Medicine

## 2022-10-25 ENCOUNTER — Other Ambulatory Visit: Payer: Medicare HMO

## 2022-10-25 DIAGNOSIS — E1165 Type 2 diabetes mellitus with hyperglycemia: Secondary | ICD-10-CM | POA: Diagnosis not present

## 2022-10-25 DIAGNOSIS — I10 Essential (primary) hypertension: Secondary | ICD-10-CM

## 2022-10-25 DIAGNOSIS — K219 Gastro-esophageal reflux disease without esophagitis: Secondary | ICD-10-CM

## 2022-10-25 DIAGNOSIS — E782 Mixed hyperlipidemia: Secondary | ICD-10-CM | POA: Diagnosis not present

## 2022-10-26 LAB — CMP14+EGFR
ALT: 35 IU/L (ref 0–44)
AST: 38 IU/L (ref 0–40)
Albumin: 4.1 g/dL (ref 3.8–4.8)
Alkaline Phosphatase: 64 IU/L (ref 44–121)
BUN/Creatinine Ratio: 9 — ABNORMAL LOW (ref 10–24)
BUN: 9 mg/dL (ref 8–27)
Bilirubin Total: 0.4 mg/dL (ref 0.0–1.2)
CO2: 25 mmol/L (ref 20–29)
Calcium: 8.9 mg/dL (ref 8.6–10.2)
Chloride: 100 mmol/L (ref 96–106)
Creatinine, Ser: 1.01 mg/dL (ref 0.76–1.27)
Globulin, Total: 1.8 g/dL (ref 1.5–4.5)
Glucose: 115 mg/dL — ABNORMAL HIGH (ref 70–99)
Potassium: 3.5 mmol/L (ref 3.5–5.2)
Sodium: 138 mmol/L (ref 134–144)
Total Protein: 5.9 g/dL — ABNORMAL LOW (ref 6.0–8.5)
eGFR: 78 mL/min/{1.73_m2} (ref >59)

## 2022-10-26 LAB — HEMOGLOBIN A1C
Est. average glucose Bld gHb Est-mCnc: 105 mg/dL
Hgb A1c MFr Bld: 5.3 % (ref 4.8–5.6)

## 2022-10-26 LAB — CBC WITH DIFFERENTIAL
Basophils Absolute: 0 10*3/uL (ref 0.0–0.2)
Basos: 1 %
EOS (ABSOLUTE): 0 10*3/uL (ref 0.0–0.4)
Eos: 1 %
Hematocrit: 38.2 % (ref 37.5–51.0)
Hemoglobin: 12.3 g/dL — ABNORMAL LOW (ref 13.0–17.7)
Immature Grans (Abs): 0 10*3/uL (ref 0.0–0.1)
Immature Granulocytes: 0 %
Lymphocytes Absolute: 0.8 10*3/uL (ref 0.7–3.1)
Lymphs: 23 %
MCH: 28.4 pg (ref 26.6–33.0)
MCHC: 32.2 g/dL (ref 31.5–35.7)
MCV: 88 fL (ref 79–97)
Monocytes Absolute: 0.4 10*3/uL (ref 0.1–0.9)
Monocytes: 11 %
Neutrophils Absolute: 2.1 10*3/uL (ref 1.4–7.0)
Neutrophils: 64 %
RBC: 4.33 x10E6/uL (ref 4.14–5.80)
RDW: 16.3 % — ABNORMAL HIGH (ref 11.6–15.4)
WBC: 3.3 10*3/uL — ABNORMAL LOW (ref 3.4–10.8)

## 2022-10-26 LAB — LIPID PANEL W/O CHOL/HDL RATIO
Cholesterol, Total: 101 mg/dL (ref 100–199)
HDL: 67 mg/dL (ref >39)
LDL Chol Calc (NIH): 18 mg/dL (ref 0–99)
Triglycerides: 83 mg/dL (ref 0–149)
VLDL Cholesterol Cal: 16 mg/dL (ref 5–40)

## 2022-10-27 ENCOUNTER — Encounter: Payer: Self-pay | Admitting: Internal Medicine

## 2022-10-27 ENCOUNTER — Ambulatory Visit (INDEPENDENT_AMBULATORY_CARE_PROVIDER_SITE_OTHER): Payer: Medicare HMO | Admitting: Internal Medicine

## 2022-10-27 VITALS — BP 124/62 | HR 58 | Ht 70.0 in | Wt 245.0 lb

## 2022-10-27 DIAGNOSIS — E1165 Type 2 diabetes mellitus with hyperglycemia: Secondary | ICD-10-CM

## 2022-10-27 DIAGNOSIS — E782 Mixed hyperlipidemia: Secondary | ICD-10-CM

## 2022-10-27 DIAGNOSIS — I10 Essential (primary) hypertension: Secondary | ICD-10-CM

## 2022-10-27 LAB — POCT CBG (FASTING - GLUCOSE)-MANUAL ENTRY: Glucose Fasting, POC: 127 mg/dL — AB (ref 70–99)

## 2022-10-27 MED ORDER — SEMAGLUTIDE(0.25 OR 0.5MG/DOS) 2 MG/3ML ~~LOC~~ SOPN: 0.5000 mg | PEN_INJECTOR | SUBCUTANEOUS | 1 refills | Status: DC

## 2022-10-27 NOTE — Progress Notes (Signed)
Established Patient Office Visit  Subjective:  Patient ID: Brandon Gibson, male    DOB: 11-12-1950  Age: 72 y.o. MRN: [id number]  Chief Complaint  Patient presents with   Follow-up    6 weeks follow up    Patient in office for 6 week follow up, discuss recent lab results. Ozempic 1 mg caused nausea, returned to 0.5 mg, tolerating well. Complains of constipation, using Miralax. Recommend increasing water intake, probiotic.  Hgb Aic improved. Decrease metformin to 500 mg once daily. Lipids well controlled.     No other concerns at this time.   Past Medical History:  Diagnosis Date   Arthritis    Atrial fibrillation (HCC)    BPH (benign prostatic hyperplasia)    Cancer (HCC)    HX SKIN CANCER Basal cell   Chickenpox    Clotting disorder (HCC)    Diabetes mellitus without complication (HCC)    type 2   Dysrhythmia    IRREG HEART BEAT   GERD (gastroesophageal reflux disease)    H/O pleurisy    Hypercholesteremia    Hyperlipidemia    Hypertension    Iron deficiency anemia due to chronic blood loss 09/18/2017   Lumbar stenosis    Measles    Mumps    Sleep apnea    sleep study Dr. Park Breed, uses CPAP    Past Surgical History:  Procedure Laterality Date   ANTERIOR CERVICAL DECOMP/DISCECTOMY FUSION  11/25/2011   Procedure: ANTERIOR CERVICAL DECOMPRESSION/DISCECTOMY FUSION 2 LEVELS;  Surgeon: Karn Cassis, MD;  Location: [facility];  Service: Neurosurgery;  Laterality: N/A;  Cervical four-five,Cervical five-six  Anterior cervical decompression/diskectomy, fusion, plate   BREAST BIOPSY Right    Benign   BREAST SURGERY Left 1986   lumpectomy   CARDIAC CATHETERIZATION     2011, [facility]   CARDIOVASCULAR STRESS TEST  2011   CERVICAL FUSION  1988   C 6/7    COLONOSCOPY WITH PROPOFOL N/A 11/08/2017   Procedure: COLONOSCOPY WITH PROPOFOL;  Surgeon: Toledo, Boykin Nearing, MD;  Location: [facility];  Service: Gastroenterology;  Laterality: N/A;   ESOPHAGOGASTRODUODENOSCOPY  (EGD) WITH PROPOFOL N/A 11/08/2017   Procedure: ESOPHAGOGASTRODUODENOSCOPY (EGD) WITH PROPOFOL;  Surgeon: Toledo, Boykin Nearing, MD;  Location: [facility] END[facility];  Service: Gastroenterology;  Laterality: N/A;   EYE SURGERY     LASIK   JOINT REPLACEMENT     KNEE ARTHROPLASTY Right 10/05/2015   Procedure: COMPUTER ASSISTED TOTAL KNEE ARTHROPLASTY;  Surgeon: Donato Heinz, MD;  Location: [facility] ORS;  Service: Orthopedics;  Laterality: Right;   KNEE ARTHROSCOPY  1986   Right   KNEE ARTHROSCOPY Left 08/10/2015   Procedure: LEFT KNEE ARTHROSCOPY, CHONDROPLASTY, MEDIAL MENISECTOMY;  Surgeon: Donato Heinz, MD;  Location: [facility] OR[facility];  Service: Orthopedics;  Laterality: Left;   LAMINECTOMY WITH POSTERIOR LATERAL ARTHRODESIS LEVEL 2 N/A 07/16/2018   Procedure: Posterior lumbar fusion with instrumentation at L3-4 with repeat facetectomy L3-4;  Surgeon: Tia Alert, MD;  Location[facility];  Service: Neurosurgery;  Laterality: N/A;  Posterior lumbar fusion with instrumentation at L3-4 with repeat facetectomy L3-4   LUMBAR LAMINECTOMY/DECOMPRESSION MICRODISCECTOMY N/A 10/04/2013   Procedure: LUMBAR TWO TO THREE LUMBAR LAMINECTOMY/DECOMPRESSION MICRODISCECTOMY 1 LEVEL;  Surgeon: Karn Cassis, MD;  Location: [facility];  Service: Neurosurgery;  Laterality: N/A;  L2-3 Laminectomy   POSTERIOR LAMINECTOMY / DECOMPRESSION LUMBAR SPINE  2006   TRANSESOPHAGEAL ECHOCARDIOGRAM  2011    Social History   Socioeconomic History   Marital status: Widowed  Spouse name: Not on file   Number of children: Not on file   Years of education: Not on file   Highest education level: Not on file  Occupational History   Not on file  Tobacco Use   Smoking status: Never   Smokeless tobacco: Never  Vaping Use   Vaping Use: Never used  Substance and Sexual Activity   Alcohol use: Yes    Alcohol/week: 14.0 standard drinks of alcohol    Types: 14 Glasses of wine per week   Drug use: No   Sexual activity: Not on file  Other  Topics Concern   Not on file  Social History Narrative   Not on file   Social Determinants of Health   Financial Resource Strain: Not on file  Food Insecurity: Not on file  Transportation Needs: Not on file  Physical Activity: Not on file  Stress: Not on file  Social Connections: Not on file  Intimate Partner Violence: Not on file    Family History  Problem Relation Age of Onset   Leukemia Father    Diabetes Father    Heart disease Mother    Prostate cancer Neg Hx    Chronic Renal Failure Neg Hx    Breast cancer Neg Hx     Allergies  Allergen Reactions   Morphine And Codeine Anaphylaxis   Ace Inhibitors Swelling    Other reaction(s): Unknown   Diazepam    Tape     Review of Systems  Constitutional: Negative.   HENT: Negative.    Eyes: Negative.   Respiratory: Negative.  Negative for cough and shortness of breath.   Cardiovascular: Negative.  Negative for chest pain, palpitations and leg swelling.  Gastrointestinal:  Positive for constipation. Negative for abdominal pain, diarrhea, heartburn, nausea and vomiting.  Genitourinary: Negative.  Negative for dysuria and flank pain.  Musculoskeletal: Negative.  Negative for joint pain and myalgias.  Skin: Negative.   Neurological: Negative.  Negative for dizziness and headaches.  Endo/Heme/Allergies: Negative.   Psychiatric/Behavioral: Negative.  Negative for depression and suicidal ideas. The patient is not nervous/anxious.        Objective:   BP 124/62   Pulse (!) 58   Ht 5\' 10"  (1.778 m)   Wt 245 lb (111.1 kg)   SpO2 98%   BMI 35.15 kg/m   Vitals:   10/27/22 0919  BP: 124/62  Pulse: (!) 58  Height: 5\' 10"  (1.778 m)  Weight: 245 lb (111.1 kg)  SpO2: 98%  BMI (Calculated): 35.15    Physical Exam Vitals and nursing note reviewed.  Constitutional:      Appearance: Normal appearance. He is obese.  HENT:     Head: Normocephalic and atraumatic.     Nose: Nose normal.  Cardiovascular:     Rate and  Rhythm: Normal rate and regular rhythm.     Pulses: Normal pulses.     Heart sounds: Normal heart sounds. No murmur heard. Pulmonary:     Effort: Pulmonary effort is normal.     Breath sounds: Normal breath sounds. No wheezing, rhonchi or rales.  Chest:     Chest wall: No tenderness.  Abdominal:     General: Bowel sounds are normal.     Palpations: Abdomen is soft. There is no mass.     Tenderness: There is no abdominal tenderness. There is no right CVA tenderness or left CVA tenderness.  Musculoskeletal:        General: Normal range of motion.  Cervical back: Normal range of motion.     Right lower leg: No edema.     Left lower leg: No edema.  Skin:    General: Skin is warm and dry.  Neurological:     General: No focal deficit present.     Mental Status: He is alert and oriented to person, place, and time.  Psychiatric:        Mood and Affect: Mood normal.        Behavior: Behavior normal.        Judgment: Judgment normal.      Results for orders placed or performed in visit on 10/27/22  POCT CBG (Fasting - Glucose)  Result Value Ref Range   Glucose Fasting, POC 127 (A) 70 - 99 mg/dL    Recent Results (from the past 2160 hour(s))  POCT CBG (Fasting - Glucose)     Status: Abnormal   Collection Time: 09/05/22  9:51 AM  Result Value Ref Range   Glucose Fasting, POC 104 (A) 70 - 99 mg/dL  CBC With Differential     Status: Abnormal   Collection Time: 10/25/22 11:37 AM  Result Value Ref Range   WBC 3.3 (L) 3.4 - 10.8 x10E3/uL   RBC 4.33 4.14 - 5.80 x10E6/uL   Hemoglobin 12.3 (L) 13.0 - 17.7 g/dL   Hematocrit 16.1 09.6 - 51.0 %   MCV 88 79 - 97 fL   MCH 28.4 26.6 - 33.0 pg   MCHC 32.2 31.5 - 35.7 g/dL   RDW 04.5 (H) 40.9 - 81.1 %   Neutrophils 64 Not Estab. %   Lymphs 23 Not Estab. %   Monocytes 11 Not Estab. %   Eos 1 Not Estab. %   Basos 1 Not Estab. %   Neutrophils Absolute 2.1 1.4 - 7.0 x10E3/uL   Lymphocytes Absolute 0.8 0.7 - 3.1 x10E3/uL   Monocytes  Absolute 0.4 0.1 - 0.9 x10E3/uL   EOS (ABSOLUTE) 0.0 0.0 - 0.4 x10E3/uL   Basophils Absolute 0.0 0.0 - 0.2 x10E3/uL   Immature Granulocytes 0 Not Estab. %   Immature Grans (Abs) 0.0 0.0 - 0.1 x10E3/uL    Comment: **Effective November 28, 2022, profile [id number] CBC/Differential**   (No Platelet) will be made non-orderable. [facility] Offers:   [id number] CBC With Differential/Platelet   CMP14+EGFR     Status: Abnormal   Collection Time: 10/25/22 11:37 AM  Result Value Ref Range   Glucose 115 (H) 70 - 99 mg/dL   BUN 9 8 - 27 mg/dL   Creatinine, Ser 9.56 0.76 - 1.27 mg/dL   eGFR 78 >21 HY/QMV/7.84   BUN/Creatinine Ratio 9 (L) 10 - 24   Sodium 138 134 - 144 mmol/L   Potassium 3.5 3.5 - 5.2 mmol/L   Chloride 100 96 - 106 mmol/L   CO2 25 20 - 29 mmol/L   Calcium 8.9 8.6 - 10.2 mg/dL   Total Protein 5.9 (L) 6.0 - 8.5 g/dL   Albumin 4.1 3.8 - 4.8 g/dL   Globulin, Total 1.8 1.5 - 4.5 g/dL   Bilirubin Total 0.4 0.0 - 1.2 mg/dL   Alkaline Phosphatase 64 44 - 121 IU/L   AST 38 0 - 40 IU/L   ALT 35 0 - 44 IU/L  Lipid Panel w/o Chol/HDL Ratio     Status: None   Collection Time: 10/25/22 11:37 AM  Result Value Ref Range   Cholesterol, Total 101 100 - 199 mg/dL   Triglycerides 83 0 - 149 mg/dL  HDL 67 >39 mg/dL   VLDL Cholesterol Cal 16 5 - 40 mg/dL   LDL Chol Calc (NIH) 18 0 - 99 mg/dL  Hemoglobin Z6X     Status: None   Collection Time: 10/25/22 11:37 AM  Result Value Ref Range   Hgb A1c MFr Bld 5.3 4.8 - 5.6 %    Comment:          Prediabetes: 5.7 - 6.4          Diabetes: >6.4          Glycemic control for adults with diabetes: <7.0    Est. average glucose Bld gHb Est-mCnc 105 mg/dL  POCT CBG (Fasting - Glucose)     Status: Abnormal   Collection Time: 10/27/22  9:24 AM  Result Value Ref Range   Glucose Fasting, POC 127 (A) 70 - 99 mg/dL      Assessment & Plan:  Continue Ozempic at 0.5 mg weekly. Increase water intake, continue Miralax, start a probiotic. Decrease metformin to once daily.    Problem List Items Addressed This Visit     Hyperlipidemia   Relevant Orders   Lipid Profile   Essential hypertension, benign   Relevant Orders   CMP14+EGFR   CBC With Differential   Severe obesity (BMI 35.0-39.9) with comorbidity (HCC)   Relevant Medications   Semaglutide,0.25 or 0.5MG /DOS, 2 MG/3ML SOPN   Type 2 diabetes mellitus with hyperglycemia, without long-term current use of insulin (HCC) - Primary   Relevant Medications   Semaglutide,0.25 or 0.5MG /DOS, 2 MG/3ML SOPN   Other Relevant Orders   POCT CBG (Fasting - Glucose) (Completed)   HgB A1c   CBC With Differential    Return in about 3 months (around 01/27/2023) for with blood work prior.   Total time spent: 25 minutes  Margaretann Loveless, MD  10/27/2022   This document may have been prepared by [facility] Voice Recognition software and as such may include unintentional dictation errors.

## 2022-10-31 ENCOUNTER — Ambulatory Visit (INDEPENDENT_AMBULATORY_CARE_PROVIDER_SITE_OTHER): Payer: Medicare HMO | Admitting: Surgery

## 2022-10-31 ENCOUNTER — Encounter: Payer: Self-pay | Admitting: Surgery

## 2022-10-31 VITALS — BP 143/82 | HR 66 | Temp 98.7°F | Ht 70.0 in | Wt 249.8 lb

## 2022-10-31 DIAGNOSIS — K648 Other hemorrhoids: Secondary | ICD-10-CM

## 2022-10-31 NOTE — Patient Instructions (Signed)
If you have any concerns or questions, please feel free to call our office.   Rectal Bleeding  Rectal bleeding is when blood comes out of the opening of the butt (anus). You may see bright red blood in your underwear or in the toilet after you poop (have a bowel movement). You may also have blood mixed with your poop (stool), or dark red or black poop. Rectal bleeding is often a sign that something is wrong. It can be caused by many things. It needs to be checked by a doctor. Follow these instructions at home: Medicines Take over-the-counter and prescription medicines only as told by your doctor. Ask your doctor about changing or stopping your normal medicines. These include blood thinners. Managing constipation Your condition may cause trouble pooping (constipation). To prevent or treat this, or to help make your poop soft, you may need to: Drink enough fluid to keep your pee (urine) pale yellow. Take over-the-counter or prescription medicines. Eat foods that are high in fiber. These include beans, whole grains, and fresh fruits and vegetables. Limit foods that are high in fat and sugar. These include fried or sweet foods.  General instructions Try not to strain when you poop. Take a warm bath. This may help with pain. Watch for changes in your symptoms. Contact a doctor if: You have pain or swelling in your belly (abdomen). You have a fever. You feel weak or like you may vomit. You cannot poop. You have new or more bleeding. You have black or dark red poop. You vomit blood or something that looks like coffee grounds. Get help right away if: You faint. You have very bad pain in your butt. These symptoms may be an emergency. Get help right away. Call 911. Do not wait to see if the symptoms will go away. Do not drive yourself to the hospital. This information is not intended to replace advice given to you by your health care provider. Make sure you discuss any questions you have  with your health care provider. Document Revised: 12/07/2021 Document Reviewed: 12/07/2021 Elsevier Patient Education  2024 [employer].

## 2022-10-31 NOTE — Progress Notes (Signed)
10/31/2022  History of Present Illness: Brandon Gibson is a 72 y.o. male presenting for follow-up of bleeding internal hemorrhoids.  The patient was last seen about 5 months ago at which time, his constipation and bleeding has been improving with MiraLAX and we decided to continue watchful waiting.  The patient reports that on Friday morning he woke up with a large spot of blood on his underwear and shorts.  The previous night he had gotten together with friends for dinner.  He denies any unusual diet or issues with perianal pain or constipation.  He reports that at home he has been taking fiber and stool softeners but has not taking any MiraLAX.  He denies any significant constipation however.  He reports that over the last few months, he has continued having very mild bleeding issues with bowel movements but nothing significant.  However this episode 3 days ago has been very worried.  His friends deny any issues or noticing blood in his shorts before he left for home.  Denies any perianal pain.  Past Medical History: Past Medical History:  Diagnosis Date   Arthritis    Atrial fibrillation (HCC)    BPH (benign prostatic hyperplasia)    Cancer (HCC)    HX SKIN CANCER Basal cell   Chickenpox    Clotting disorder (HCC)    Diabetes mellitus without complication (HCC)    type 2   Dysrhythmia    IRREG HEART BEAT   GERD (gastroesophageal reflux disease)    H/O pleurisy    Hypercholesteremia    Hyperlipidemia    Hypertension    Iron deficiency anemia due to chronic blood loss 09/18/2017   Lumbar stenosis    Measles    Mumps    Sleep apnea    sleep study Dr. Park Breed, uses CPAP     Past Surgical History: Past Surgical History:  Procedure Laterality Date   ANTERIOR CERVICAL DECOMP/DISCECTOMY FUSION  11/25/2011   Procedure: ANTERIOR CERVICAL DECOMPRESSION/DISCECTOMY FUSION 2 LEVELS;  Surgeon: Karn Cassis, MD;  Location: [facility];  Service: Neurosurgery;  Laterality: N/A;  Cervical  four-five,Cervical five-six  Anterior cervical decompression/diskectomy, fusion, plate   BREAST BIOPSY Right    Benign   BREAST SURGERY Left 1986   lumpectomy   CARDIAC CATHETERIZATION     2011, [facility]   CARDIOVASCULAR STRESS TEST  2011   CERVICAL FUSION  1988   C 6/7    COLONOSCOPY WITH PROPOFOL N/A 11/08/2017   Procedure: COLONOSCOPY WITH PROPOFOL;  Surgeon: Toledo, Boykin Nearing, MD;  Location: [facility];  Service: Gastroenterology;  Laterality: N/A;   ESOPHAGOGASTRODUODENOSCOPY (EGD) WITH PROPOFOL N/A 11/08/2017   Procedure: ESOPHAGOGASTRODUODENOSCOPY (EGD) WITH PROPOFOL;  Surgeon: Toledo, Boykin Nearing, MD;  Location: [facility];  Service: Gastroenterology;  Laterality: N/A;   EYE SURGERY     LASIK   JOINT REPLACEMENT     KNEE ARTHROPLASTY Right 10/05/2015   Procedure: COMPUTER ASSISTED TOTAL KNEE ARTHROPLASTY;  Surgeon: Donato Heinz, MD;  Location: [facility];  Service: Orthopedics;  Laterality: Right;   KNEE ARTHROSCOPY  1986   Right   KNEE ARTHROSCOPY Left 08/10/2015   Procedure: LEFT KNEE ARTHROSCOPY, CHONDROPLASTY, MEDIAL MENISECTOMY;  Surgeon: Donato Heinz, MD;  Location: [facility];  Service: Orthopedics;  Laterality: Left;   LAMINECTOMY WITH POSTERIOR LATERAL ARTHRODESIS LEVEL 2 N/A 07/16/2018   Procedure: Posterior lumbar fusion with instrumentation at L3-4 with repeat facetectomy L3-4;  Surgeon: Tia Alert, MD;  [facility]: [facility];  Service: Neurosurgery;  Laterality: N/A;  Posterior lumbar fusion with instrumentation at L3-4 with repeat facetectomy L3-4   LUMBAR LAMINECTOMY/DECOMPRESSION MICRODISCECTOMY N/A 10/04/2013   Procedure: LUMBAR TWO TO THREE LUMBAR LAMINECTOMY/DECOMPRESSION MICRODISCECTOMY 1 LEVEL;  Surgeon: Karn Cassis, MD;  Location: [facility];  Service: Neurosurgery;  Laterality: N/A;  L2-3 Laminectomy   POSTERIOR LAMINECTOMY / DECOMPRESSION LUMBAR SPINE  2006   TRANSESOPHAGEAL ECHOCARDIOGRAM  2011    Home Medications: Prior to Admission medications    Medication Sig Start Date End Date Taking? Authorizing Provider  amiodarone (PACERONE) 200 MG tablet Take 200 mg by mouth at bedtime.   Yes [provider]  b complex vitamins tablet Take 1 tablet by mouth daily.   Yes [provider]  chlorthalidone (HYGROTON) 25 MG tablet TAKE 1 TABLET EVERY DAY 10/06/22  Yes Adrian Blackwater A, MD  docusate (COLACE) 50 MG/5ML liquid Take by mouth daily.   Yes [provider]  fluticasone (FLONASE) 50 MCG/ACT nasal spray Place 2 sprays into both nostrils daily.   Yes [provider]  glucose blood (ACCU-CHEK AVIVA PLUS) test strip TEST BLOOD SUGAR EVERY DAY 07/11/22  Yes Margaretann Loveless, MD  hydrALAZINE (APRESOLINE) 50 MG tablet Take 50 mg by mouth daily.    Yes [provider]  Iron-Vitamin C 65-125 MG TABS Take 1 tablet by mouth 2 (two) times daily. 02/11/19  Yes Rickard Patience, MD  loratadine (CLARITIN) 10 MG tablet Take 1 tablet (10 mg total) by mouth daily. 10/07/22 10/07/23 Yes Margaretann Loveless, MD  metFORMIN (GLUCOPHAGE) 500 MG tablet TAKE 1 TABLET TWICE DAILY 10/20/22  Yes Margaretann Loveless, MD  metoprolol succinate (TOPROL-XL) 50 MG 24 hr tablet Take 50 mg by mouth every morning.   Yes [provider]  Omega-3 Fatty Acids (FISH OIL) 1000 MG CAPS Take 1,000 mg by mouth 2 (two) times daily.    Yes [provider]  omeprazole (PRILOSEC) 40 MG capsule Take 40 mg by mouth every morning.    Yes [provider]  potassium chloride (KLOR-CON) 10 MEQ tablet TAKE 1 TABLET EVERY DAY 10/20/22  Yes Margaretann Loveless, MD  rivaroxaban (XARELTO) 20 MG TABS tablet Take 20 mg by mouth at bedtime.   Yes [provider]  rosuvastatin (CRESTOR) 20 MG tablet Take 20 mg by mouth at bedtime.    Yes [provider]  Semaglutide,0.25 or 0.5MG /DOS, 2 MG/3ML SOPN Inject 0.5 mg into the skin once a week. 10/27/22  Yes Margaretann Loveless, MD  tamsulosin (FLOMAX) 0.4 MG CAPS capsule Take 1 capsule (0.4 mg total) by mouth  daily. 06/20/22  Yes MacDiarmid, Lorin Picket, MD  trimethoprim (TRIMPEX) 100 MG tablet Take 1 tablet (100 mg total) by mouth daily. 06/20/22 06/17/23 Yes MacDiarmid, Lorin Picket, MD    Allergies: Allergies  Allergen Reactions   Morphine And Codeine Anaphylaxis   Ace Inhibitors Swelling    Other reaction(s): Unknown   Diazepam    Tape     Review of Systems: Review of Systems  Constitutional:  Negative for chills and fever.  Respiratory:  Negative for shortness of breath.   Cardiovascular:  Negative for chest pain.  Gastrointestinal:  Positive for blood in stool and constipation. Negative for nausea and vomiting.    Physical Exam BP (!) 143/82   Pulse 66   Temp 98.7 F (37.1 C) (Oral)   Ht 5\' 10"  (1.778 m)   Wt 249 lb 12.8 oz (113.3 kg)   SpO2 98%   BMI 35.84 kg/m  CONSTITUTIONAL: No acute distress HEENT:  Normocephalic, atraumatic, extraocular motion intact. RESPIRATORY:  Normal respiratory effort without pathologic use of accessory muscles. CARDIOVASCULAR: Regular rhythm and rate. RECTAL: External exam reveals mildly enlarged external hemorrhoid tissues, without any inflammation or swelling or induration or evidence of thrombosis.  There were no anal fissures.  On digital rectal exam, there is a mildly enlarged internal hemorrhoids but without any gross bleeding at this point.  No other palpable masses. NEUROLOGIC:  Motor and sensation is grossly normal.  Cranial nerves are grossly intact. PSYCH:  Alert and oriented to person, place and time. Affect is normal.  Assessment and Plan: This is a 72 y.o. male with bleeding internal hemorrhoids.  - Discussed with patient that the bleeding episode may have been related to any constipation or irritation at the patient had.  However I cannot definitively explain the reason for his bleeding.  He has not had any other episodes of bleeding like that before or after. - Recommended the patient's start taking again MiraLAX to help with any constipation  issues so his bowel movements are softer each day.  He can continue doing stool softeners and fiber supplementation.  Discussed also doing sitz bath versus aiming the showerhead towards the perianal area to sooth any potential inflamed tissues. - Discussed with the patient also that since he is still has been having mild bleeding issues through the months although less significant, we could still proceed with surgical management of his hemorrhoids versus referring him to gastroenterology for banding of the internal hemorrhoids.  Since he has not really had any issues with external hemorrhoids, proceeding with banding instead can potentially be appropriate solution which would not require general anesthesia.  At this point, the patient started to process everything going on he will think about his options of surgery versus referral.  He will call us back with his decision. - All of his questions have been answered.  Follow-up as needed.  I spent 30 minutes dedicated to the care of this patient on the date of this encounter to include pre-visit review of records, face-to-face time with the patient discussing diagnosis and management, and any post-visit coordination of care.   Howie Ill, MD [facility]

## 2022-11-07 ENCOUNTER — Telehealth: Payer: Self-pay | Admitting: Internal Medicine

## 2022-11-07 NOTE — Telephone Encounter (Signed)
Patient left VM requesting a call back - did not specify what for.

## 2022-11-14 ENCOUNTER — Telehealth: Payer: Self-pay | Admitting: Internal Medicine

## 2022-11-14 ENCOUNTER — Other Ambulatory Visit: Payer: Self-pay | Admitting: Internal Medicine

## 2022-11-14 DIAGNOSIS — Z2989 Encounter for other specified prophylactic measures: Secondary | ICD-10-CM

## 2022-11-14 MED ORDER — AMOXICILLIN 500 MG PO CAPS
2000.0000 mg | ORAL_CAPSULE | Freq: Once | ORAL | 0 refills | Status: AC
Start: 2022-11-14 — End: 2022-11-14

## 2022-11-14 NOTE — Telephone Encounter (Signed)
Patient stopped by the office needing a new Rx for amoxicillin 500 mg because he has an upcoming dentist appt. Please advise.

## 2022-11-17 DIAGNOSIS — M542 Cervicalgia: Secondary | ICD-10-CM | POA: Diagnosis not present

## 2022-11-17 DIAGNOSIS — Z6838 Body mass index (BMI) 38.0-38.9, adult: Secondary | ICD-10-CM | POA: Diagnosis not present

## 2022-11-17 DIAGNOSIS — M5416 Radiculopathy, lumbar region: Secondary | ICD-10-CM | POA: Diagnosis not present

## 2022-12-12 ENCOUNTER — Other Ambulatory Visit: Payer: Self-pay | Admitting: Internal Medicine

## 2022-12-12 DIAGNOSIS — E1165 Type 2 diabetes mellitus with hyperglycemia: Secondary | ICD-10-CM

## 2023-01-15 ENCOUNTER — Other Ambulatory Visit: Payer: Self-pay | Admitting: Internal Medicine

## 2023-02-01 ENCOUNTER — Other Ambulatory Visit: Payer: Medicare HMO

## 2023-02-01 DIAGNOSIS — E782 Mixed hyperlipidemia: Secondary | ICD-10-CM

## 2023-02-01 DIAGNOSIS — E1165 Type 2 diabetes mellitus with hyperglycemia: Secondary | ICD-10-CM

## 2023-02-01 DIAGNOSIS — I10 Essential (primary) hypertension: Secondary | ICD-10-CM

## 2023-02-02 LAB — CBC WITH DIFFERENTIAL/PLATELET
Basophils Absolute: 0 10*3/uL (ref 0.0–0.2)
Basos: 1 %
EOS (ABSOLUTE): 0.1 10*3/uL (ref 0.0–0.4)
Eos: 3 %
Hematocrit: 33.4 % — ABNORMAL LOW (ref 37.5–51.0)
Hemoglobin: 9.8 g/dL — ABNORMAL LOW (ref 13.0–17.7)
Immature Grans (Abs): 0 10*3/uL (ref 0.0–0.1)
Immature Granulocytes: 0 %
Lymphocytes Absolute: 0.7 10*3/uL (ref 0.7–3.1)
Lymphs: 20 %
MCH: 23.9 pg — ABNORMAL LOW (ref 26.6–33.0)
MCHC: 29.3 g/dL — ABNORMAL LOW (ref 31.5–35.7)
MCV: 82 fL (ref 79–97)
Monocytes Absolute: 0.5 10*3/uL (ref 0.1–0.9)
Monocytes: 13 %
Neutrophils Absolute: 2.3 10*3/uL (ref 1.4–7.0)
Neutrophils: 63 %
Platelets: 152 10*3/uL (ref 150–450)
RBC: 4.1 x10E6/uL — ABNORMAL LOW (ref 4.14–5.80)
RDW: 16.4 % — ABNORMAL HIGH (ref 11.6–15.4)
WBC: 3.6 10*3/uL (ref 3.4–10.8)

## 2023-02-02 LAB — LIPID PANEL
Chol/HDL Ratio: 1.8 {ratio} (ref 0.0–5.0)
Cholesterol, Total: 118 mg/dL (ref 100–199)
HDL: 66 mg/dL (ref >39)
LDL Chol Calc (NIH): 31 mg/dL (ref 0–99)
Triglycerides: 124 mg/dL (ref 0–149)
VLDL Cholesterol Cal: 21 mg/dL (ref 5–40)

## 2023-02-02 LAB — CMP14+EGFR
ALT: 36 [IU]/L (ref 0–44)
AST: 39 [IU]/L (ref 0–40)
Albumin: 4.1 g/dL (ref 3.8–4.8)
Alkaline Phosphatase: 58 [IU]/L (ref 44–121)
BUN/Creatinine Ratio: 11 (ref 10–24)
BUN: 11 mg/dL (ref 8–27)
Bilirubin Total: 0.3 mg/dL (ref 0.0–1.2)
CO2: 22 mmol/L (ref 20–29)
Calcium: 8.6 mg/dL (ref 8.6–10.2)
Chloride: 103 mmol/L (ref 96–106)
Creatinine, Ser: 1.02 mg/dL (ref 0.76–1.27)
Globulin, Total: 1.4 g/dL — ABNORMAL LOW (ref 1.5–4.5)
Glucose: 146 mg/dL — ABNORMAL HIGH (ref 70–99)
Potassium: 3.9 mmol/L (ref 3.5–5.2)
Sodium: 142 mmol/L (ref 134–144)
Total Protein: 5.5 g/dL — ABNORMAL LOW (ref 6.0–8.5)
eGFR: 77 mL/min/{1.73_m2} (ref >59)

## 2023-02-02 LAB — HEMOGLOBIN A1C
Est. average glucose Bld gHb Est-mCnc: 128 mg/dL
Hgb A1c MFr Bld: 6.1 % — ABNORMAL HIGH (ref 4.8–5.6)

## 2023-02-03 ENCOUNTER — Encounter: Payer: Self-pay | Admitting: Internal Medicine

## 2023-02-03 ENCOUNTER — Ambulatory Visit (INDEPENDENT_AMBULATORY_CARE_PROVIDER_SITE_OTHER): Payer: Medicare HMO | Admitting: Internal Medicine

## 2023-02-03 VITALS — BP 120/76 | HR 70 | Ht 70.0 in | Wt 252.8 lb

## 2023-02-03 DIAGNOSIS — I4811 Longstanding persistent atrial fibrillation: Secondary | ICD-10-CM

## 2023-02-03 DIAGNOSIS — D500 Iron deficiency anemia secondary to blood loss (chronic): Secondary | ICD-10-CM | POA: Diagnosis not present

## 2023-02-03 DIAGNOSIS — K219 Gastro-esophageal reflux disease without esophagitis: Secondary | ICD-10-CM

## 2023-02-03 DIAGNOSIS — E782 Mixed hyperlipidemia: Secondary | ICD-10-CM

## 2023-02-03 DIAGNOSIS — G4733 Obstructive sleep apnea (adult) (pediatric): Secondary | ICD-10-CM | POA: Diagnosis not present

## 2023-02-03 DIAGNOSIS — E1165 Type 2 diabetes mellitus with hyperglycemia: Secondary | ICD-10-CM | POA: Diagnosis not present

## 2023-02-03 DIAGNOSIS — I10 Essential (primary) hypertension: Secondary | ICD-10-CM | POA: Diagnosis not present

## 2023-02-03 LAB — POCT CBG (FASTING - GLUCOSE)-MANUAL ENTRY: Glucose Fasting, POC: 150 mg/dL — AB (ref 70–99)

## 2023-02-03 NOTE — Progress Notes (Signed)
Established Patient Office Visit  Subjective:  Patient ID: Brandon Gibson, male    DOB: 1950-09-18  Age: 72 y.o. MRN: [id number]  Chief Complaint  Patient presents with   Follow-up    3 month follow up    Patient is here for his follow-up.  Labs were done earlier and the results were discussed today.  His H&H has dropped significantly and he admits to having bleeding per rectum from his hemorrhoids.  He has been evaluated by the surgeon and surgery has been recommended which he will seriously consider now.  He does have episodes where he feels lightheaded when standing up from a sitting position.  He has been advised compression stockings in the past but with additional factor of anemia he needs to be more careful.  Advised to take his iron supplement twice a day.  Denies any chest pain or shortness of breath.  No nausea vomiting or diarrhea.  He was also noted to have diverticuli on his most recent colonoscopy. Patient stopped taking his Ozempic as he was having lots of nausea at 0.5 mg/week dose.  Also the price had jumped up as he was in the donut hole for his prescription plan.  Will wait and consider starting Zepbound next year.  Meanwhile he needs to control his diet very strictly.    No other concerns at this time.   Past Medical History:  Diagnosis Date   Arthritis    Atrial fibrillation (HCC)    BPH (benign prostatic hyperplasia)    Cancer (HCC)    HX SKIN CANCER Basal cell   Chickenpox    Clotting disorder (HCC)    Diabetes mellitus without complication (HCC)    type 2   Dysrhythmia    IRREG HEART BEAT   GERD (gastroesophageal reflux disease)    H/O pleurisy    Hypercholesteremia    Hyperlipidemia    Hypertension    Iron deficiency anemia due to chronic blood loss 09/18/2017   Lumbar stenosis    Measles    Mumps    Sleep apnea    sleep study Dr. Park Breed, uses CPAP    Past Surgical History:  Procedure Laterality Date   ANTERIOR CERVICAL DECOMP/DISCECTOMY  FUSION  11/25/2011   Procedure: ANTERIOR CERVICAL DECOMPRESSION/DISCECTOMY FUSION 2 LEVELS;  Surgeon: Karn Cassis, MD;  Location: [facility];  Service: Neurosurgery;  Laterality: N/A;  Cervical four-five,Cervical five-six  Anterior cervical decompression/diskectomy, fusion, plate   BREAST BIOPSY Right    Benign   BREAST SURGERY Left 1986   lumpectomy   CARD[facility]AC CATHETERIZATION     2011, [facility]   CARDIOVASCULAR STRESS TEST  2011   CERVICAL FUSION  1988   C 6/7    COLONOSCOPY WITH PROPOFOL N/A 11/08/2017   Procedure: COLONOSCOPY WITH PROPOFOL;  Surgeon: Toledo, Boykin Nearing, MD;  Location: [facility];  Service: Gastroenterology;  Laterality: N/A;   ESOPHAGOGASTRODUODENOSCOPY (EGD) WITH PROPOFOL N/A 11/08/2017   Procedure: ESOPHAGOGASTRODUODENOSCOPY (EGD) WITH PROPOFOL;  Surgeon: Toledo, Boykin Nearing, MD;  Location: [facility];  Service: Gastroenterology;  Laterality: N/A;   EYE SURGERY     LASIK   JOINT REPLACEMENT     KNEE ARTHROPLASTY Right 10/05/2015   Procedure: COMPUTER ASSISTED TOTAL KNEE ARTHROPLASTY;  Surgeon: Donato Heinz, MD;  Location: [facility];  Service: Orthopedics;  Laterality: Right;   KNEE ARTHROSCOPY  1986   Right   KNEE ARTHROSCOPY Left 08/10/2015   Procedure: LEFT KNEE ARTHROSCOPY, CHONDROPLASTY, MEDIAL MENISECTOMY;  Surgeon: Illene Labrador  Hooten, MD;  Location: [facility];  Service: Orthopedics;  Laterality: Left;   LAMINECTOMY WITH POSTERIOR LATERAL ARTHRODESIS LEVEL 2 N/A 07/16/2018   Procedure: Posterior lumbar fusion with instrumentation at L3-4 with repeat facetectomy L3-4;  Surgeon: Tia Alert, MD;  Location: [facility] OR;  Service: Neurosurgery;  Laterality: N/A;  Posterior lumbar fusion with instrumentation at L3-4 with repeat facetectomy L3-4   LUMBAR LAMINECTOMY/DECOMPRESSION MICRODISCECTOMY N/A 10/04/2013   Procedure: LUMBAR TWO TO THREE LUMBAR LAMINECTOMY/DECOMPRESSION MICRODISCECTOMY 1 LEVEL;  Surgeon: Karn Cassis, MD;  Location: [facility];  Service:  Neurosurgery;  Laterality: N/A;  L2-3 Laminectomy   POSTERIOR LAMINECTOMY / DECOMPRESSION LUMBAR SPINE  2006   TRANSESOPHAGEAL ECHOCARDIOGRAM  2011    Social History   Socioeconomic History   Marital status: Widowed    Spouse name: Not on file   Number of children: Not on file   Years of education: Not on file   Highest education level: Not on file  Occupational History   Not on file  Tobacco Use   Smoking status: Never   Smokeless tobacco: Never  Vaping Use   Vaping status: Never Used  Substance and Sexual Activity   Alcohol use: Yes    Alcohol/week: 14.0 standard drinks of alcohol    Types: 14 Glasses of wine per week   Drug use: No   Sexual activity: Not on file  Other Topics Concern   Not on file  Social History Narrative   Not on file   Social Determinants of Health   Financial Resource Strain: Not on file  Food Insecurity: Not on file  Transportation Needs: Not on file  Physical Activity: Not on file  Stress: Not on file  Social Connections: Not on file  Intimate Partner Violence: Not on file    Family History  Problem Relation Age of Onset   Leukemia Father    Diabetes Father    Heart disease Mother    Prostate cancer Neg Hx    Chronic Renal Failure Neg Hx    Breast cancer Neg Hx     Allergies  Allergen Reactions   Morphine And Codeine Anaphylaxis   Ace Inhibitors Swelling    Other reaction(s): Unknown   Diazepam    Tape     Review of Systems  Constitutional: Negative.  Negative for chills, fever, malaise/fatigue and weight loss.  HENT: Negative.  Negative for sore throat.   Eyes: Negative.   Respiratory: Negative.  Negative for cough and shortness of breath.   Cardiovascular: Negative.  Negative for chest pain, palpitations and leg swelling.  Gastrointestinal:  Positive for blood in stool. Negative for abdominal pain, constipation, diarrhea, heartburn, melena, nausea and vomiting.  Genitourinary: Negative.  Negative for dysuria and flank  pain.  Musculoskeletal: Negative.  Negative for back pain, falls, joint pain, myalgias and neck pain.  Skin:  Negative for itching and rash.  Neurological:  Positive for dizziness. Negative for headaches.  Endo/Heme/Allergies: Negative.   Psychiatric/Behavioral: Negative.  Negative for depression and suicidal ideas. The patient is not nervous/anxious.        Objective:   BP 120/76   Pulse 70   Ht 5\' 10"  (1.778 m)   Wt 252 lb 12.8 oz (114.7 kg)   SpO2 98%   BMI 36.27 kg/m   Vitals:   02/03/23 0914  BP: 120/76  Pulse: 70  Height: 5\' 10"  (1.778 m)  Weight: 252 lb 12.8 oz (114.7 kg)  SpO2: 98%  BMI (Calculated): 36.27  Physical Exam Vitals and nursing note reviewed.  Constitutional:      General: He is not in acute distress.    Appearance: Normal appearance.  HENT:     Head: Normocephalic and atraumatic.     Nose: Nose normal.     Mouth/Throat:     Mouth: Mucous membranes are moist.     Pharynx: Oropharynx is clear.  Eyes:     Conjunctiva/sclera: Conjunctivae normal.     Pupils: Pupils are equal, round, and reactive to light.  Cardiovascular:     Rate and Rhythm: Normal rate and regular rhythm.     Pulses: Normal pulses.     Heart sounds: Normal heart sounds.  Pulmonary:     Effort: Pulmonary effort is normal.     Breath sounds: Normal breath sounds. No wheezing, rhonchi or rales.  Abdominal:     General: Bowel sounds are normal.     Palpations: Abdomen is soft. There is no mass.     Tenderness: There is no abdominal tenderness. There is no right CVA tenderness, left CVA tenderness, guarding or rebound.     Hernia: No hernia is present.  Musculoskeletal:        General: Normal range of motion.     Cervical back: Normal range of motion.     Right lower leg: No edema.     Left lower leg: No edema.  Skin:    General: Skin is warm and dry.     Findings: No rash.  Neurological:     General: No focal deficit present.     Mental Status: He is alert and  oriented to person, place, and time.  Psychiatric:        Mood and Affect: Mood normal.        Behavior: Behavior normal.        Judgment: Judgment normal.      Results for orders placed or performed in visit on 02/03/23  POCT CBG (Fasting - Glucose)  Result Value Ref Range   Glucose Fasting, POC 150 (A) 70 - 99 mg/dL    Recent Results (from the past 2160 hour(s))  CMP14+EGFR     Status: Abnormal   Collection Time: 02/01/23  9:15 AM  Result Value Ref Range   Glucose 146 (H) 70 - 99 mg/dL   BUN 11 8 - 27 mg/dL   Creatinine, Ser 7.82 0.76 - 1.27 mg/dL   eGFR 77 >95 AO/ZHY/8.65   BUN/Creatinine Ratio 11 10 - 24   Sodium 142 134 - 144 mmol/L   Potassium 3.9 3.5 - 5.2 mmol/L   Chloride 103 96 - 106 mmol/L   CO2 22 20 - 29 mmol/L   Calcium 8.6 8.6 - 10.2 mg/dL   Total Protein 5.5 (L) 6.0 - 8.5 g/dL   Albumin 4.1 3.8 - 4.8 g/dL   Globulin, Total 1.4 (L) 1.5 - 4.5 g/dL   Bilirubin Total 0.3 0.0 - 1.2 mg/dL   Alkaline Phosphatase 58 44 - 121 IU/L   AST 39 0 - 40 IU/L   ALT 36 0 - 44 IU/L  HgB A1c     Status: Abnormal   Collection Time: 02/01/23  9:15 AM  Result Value Ref Range   Hgb A1c MFr Bld 6.1 (H) 4.8 - 5.6 %    Comment:          Prediabetes: 5.7 - 6.4          Diabetes: >6.4  Glycemic control for adults with diabetes: <7.0    Est. average glucose Bld gHb Est-mCnc 128 mg/dL  Lipid Profile     Status: None   Collection Time: 02/01/23  9:15 AM  Result Value Ref Range   Cholesterol, Total 118 100 - 199 mg/dL   Triglycerides 161 0 - 149 mg/dL   HDL 66 >09 mg/dL   VLDL Cholesterol Cal 21 5 - 40 mg/dL   LDL Chol Calc (NIH) 31 0 - 99 mg/dL   Chol/HDL Ratio 1.8 0.0 - 5.0 ratio    Comment:                                   T. Chol/HDL Ratio                                             Men  Women                               1/2 Avg.Risk  3.4    3.3                                   Avg.Risk  5.0    4.4                                2X Avg.Risk  9.6    7.1                                 3X Avg.Risk 23.4   11.0   CBC with Differential/Platelet     Status: Abnormal   Collection Time: 02/01/23  9:15 AM  Result Value Ref Range   WBC 3.6 3.4 - 10.8 x10E3/uL   RBC 4.10 (L) 4.14 - 5.80 x10E6/uL   Hemoglobin 9.8 (L) 13.0 - 17.7 g/dL   Hematocrit 60.4 (L) 54.0 - 51.0 %   MCV 82 79 - 97 fL   MCH 23.9 (L) 26.6 - 33.0 pg   MCHC 29.3 (L) 31.5 - 35.7 g/dL   RDW 98.1 (H) 19.1 - 47.8 %   Platelets 152 150 - 450 x10E3/uL   Neutrophils 63 Not Estab. %   Lymphs 20 Not Estab. %   Monocytes 13 Not Estab. %   Eos 3 Not Estab. %   Basos 1 Not Estab. %   Neutrophils Absolute 2.3 1.4 - 7.0 x10E3/uL   Lymphocytes Absolute 0.7 0.7 - 3.1 x10E3/uL   Monocytes Absolute 0.5 0.1 - 0.9 x10E3/uL   EOS (ABSOLUTE) 0.1 0.0 - 0.4 x10E3/uL   Basophils Absolute 0.0 0.0 - 0.2 x10E3/uL   Immature Granulocytes 0 Not Estab. %   Immature Grans (Abs) 0.0 0.0 - 0.1 x10E3/uL  POCT CBG (Fasting - Glucose)     Status: Abnormal   Collection Time: 02/03/23  9:22 AM  Result Value Ref Range   Glucose Fasting, POC 150 (A) 70 - 99 mg/dL      Assessment & Plan:  Continue current medications, strict diet control. Start taking iron pills regularly. Stay off Ozempic for now.  Will  consider stepdown to later on. Compression stockings as tolerated. Proceed with surgical recommendation for hemorrhoids. Problem List Items Addressed This Visit     Iron deficiency anemia due to chronic blood loss   GERD (gastroesophageal reflux disease)   Hyperlipidemia   Essential hypertension, benign   Atrial fibrillation (HCC)   Obstructive sleep apnea   Type 2 diabetes mellitus with hyperglycemia, without long-term current use of insulin (HCC) - Primary   Relevant Orders   POCT CBG (Fasting - Glucose) (Completed)    Return in about 1 month (around 03/06/2023).   Total time spent: 30 minutes  Margaretann Loveless, MD  02/03/2023   This document may have been prepared by [facility] Voice Recognition  software and as such may include unintentional dictation errors.

## 2023-02-07 ENCOUNTER — Telehealth: Payer: Self-pay | Admitting: Internal Medicine

## 2023-02-07 NOTE — Telephone Encounter (Signed)
Patient left VM that he needs more Xarelto samples. Do we have any 20 mg downstairs?

## 2023-02-14 ENCOUNTER — Ambulatory Visit: Payer: Medicare HMO | Admitting: Cardiovascular Disease

## 2023-02-14 ENCOUNTER — Encounter: Payer: Self-pay | Admitting: Cardiovascular Disease

## 2023-02-14 VITALS — BP 111/73 | HR 63 | Ht 70.0 in | Wt 252.2 lb

## 2023-02-14 DIAGNOSIS — I493 Ventricular premature depolarization: Secondary | ICD-10-CM | POA: Diagnosis not present

## 2023-02-14 DIAGNOSIS — R9431 Abnormal electrocardiogram [ECG] [EKG]: Secondary | ICD-10-CM

## 2023-02-14 DIAGNOSIS — R42 Dizziness and giddiness: Secondary | ICD-10-CM

## 2023-02-14 DIAGNOSIS — I4811 Longstanding persistent atrial fibrillation: Secondary | ICD-10-CM | POA: Diagnosis not present

## 2023-02-14 DIAGNOSIS — I10 Essential (primary) hypertension: Secondary | ICD-10-CM | POA: Diagnosis not present

## 2023-02-14 DIAGNOSIS — E1165 Type 2 diabetes mellitus with hyperglycemia: Secondary | ICD-10-CM

## 2023-02-14 DIAGNOSIS — G4733 Obstructive sleep apnea (adult) (pediatric): Secondary | ICD-10-CM | POA: Diagnosis not present

## 2023-02-14 DIAGNOSIS — E782 Mixed hyperlipidemia: Secondary | ICD-10-CM | POA: Diagnosis not present

## 2023-02-14 NOTE — Progress Notes (Signed)
Cardiology Office Note   Date:  02/14/2023   ID:  Brandon Gibson, Brandon Gibson 12-Jul-1950, MRN [id number]  PCP:  Margaretann Loveless, MD  Cardiologist:  Adrian Blackwater, MD      History of Present Illness: Brandon Gibson is a 72 y.o. male who presents for  Chief Complaint  Patient presents with   Follow-up    Dizziness with standing, higher blood sugar 156  Thinks his sugar might be the result of it    Dizziness This is a new problem. The current episode started more than 1 month ago. The problem has been waxing and waning.      Past Medical History:  Diagnosis Date   Arthritis    Atrial fibrillation (HCC)    BPH (benign prostatic hyperplasia)    Cancer (HCC)    HX SKIN CANCER Basal cell   Chickenpox    Clotting disorder (HCC)    Diabetes mellitus without complication (HCC)    type 2   Dysrhythmia    IRREG HEART BEAT   GERD (gastroesophageal reflux disease)    H/O pleurisy    Hypercholesteremia    Hyperlipidemia    Hypertension    Iron deficiency anemia due to chronic blood loss 09/18/2017   Lumbar stenosis    Measles    Mumps    Sleep apnea    sleep study Dr. Park Breed, uses CPAP     Past Surgical History:  Procedure Laterality Date   ANTERIOR CERVICAL DECOMP/DISCECTOMY FUSION  11/25/2011   Procedure: ANTERIOR CERVICAL DECOMPRESSION/DISCECTOMY FUSION 2 LEVELS;  Surgeon: Karn Cassis, MD;  Location: [facility];  Service: Neurosurgery;  Laterality: N/A;  Cervical four-five,Cervical five-six  Anterior cervical decompression/diskectomy, fusion, plate   BREAST BIOPSY Right    Benign   BREAST SURGERY Left 1986   lumpectomy   CARDIAC CATHETERIZATION     2011, [facility]   CARDIOVASCULAR STRESS TEST  2011   CERVICAL FUSION  1988   C 6/7    COLONOSCOPY WITH PROPOFOL N/A 11/08/2017   Procedure: COLONOSCOPY WITH PROPOFOL;  Surgeon: Toledo, Boykin Nearing, MD;  Location: [facility];  Service: Gastroenterology;  Laterality: N/A;   ESOPHAGOGASTRODUODENOSCOPY (EGD) WITH  PROPOFOL N/A 11/08/2017   Procedure: ESOPHAGOGASTRODUODENOSCOPY (EGD) WITH PROPOFOL;  Surgeon: Toledo, Boykin Nearing, MD;  Location: [facility];  Service: Gastroenterology;  Laterality: N/A;   EYE SURGERY     LASIK   JOINT REPLACEMENT     KNEE ARTHROPLASTY Right 10/05/2015   Procedure: COMPUTER ASSISTED TOTAL KNEE ARTHROPLASTY;  Surgeon: Donato Heinz, MD;  Location: [facility];  Service: Orthopedics;  Laterality: Right;   KNEE ARTHROSCOPY  1986   Right   KNEE ARTHROSCOPY Left 08/10/2015   Procedure: LEFT KNEE ARTHROSCOPY, CHONDROPLASTY, MEDIAL MENISECTOMY;  Surgeon: Donato Heinz, MD;  Location: [facility];  Service: Orthopedics;  Laterality: Left;   LAMINECTOMY WITH POSTERIOR LATERAL ARTHRODESIS LEVEL 2 N/A 07/16/2018   Procedure: Posterior lumbar fusion with instrumentation at L3-4 with repeat facetectomy L3-4;  Surgeon: Tia Alert, MD;  Location: [facility];  Service: Neurosurgery;  Laterality: N/A;  Posterior lumbar fusion with instrumentation at L3-4 with repeat facetectomy L3-4   LUMBAR LAMINECTOMY/DECOMPRESSION MICRODISCECTOMY N/A 10/04/2013   Procedure: LUMBAR TWO TO THREE LUMBAR LAMINECTOMY/DECOMPRESSION MICRODISCECTOMY 1 LEVEL;  Surgeon: Karn Cassis, MD;  Location: [facility];  Service: Neurosurgery;  Laterality: N/A;  L2-3 Laminectomy   POSTERIOR LAMINECTOMY / DECOMPRESSION LUMBAR SPINE  2006   TRANSESOPHAGEAL ECHOCARDIOGRAM  2011  Current Outpatient Medications  Medication Sig Dispense Refill   amiodarone (PACERONE) 200 MG tablet Take 200 mg by mouth at bedtime.     b complex vitamins tablet Take 1 tablet by mouth daily. (Patient not taking: Reported on 02/03/2023)     chlorthalidone (HYGROTON) 25 MG tablet TAKE 1 TABLET EVERY DAY 90 tablet 3   docusate (COLACE) 50 MG/5ML liquid Take by mouth daily.     fluticasone (FLONASE) 50 MCG/ACT nasal spray Place 2 sprays into both nostrils daily.     glucose blood (ACCU-CHEK AVIVA PLUS) test strip TEST BLOOD SUGAR EVERY DAY 100  strip 3   hydrALAZINE (APRESOLINE) 50 MG tablet TAKE 1 TABLET TWICE DAILY 180 tablet 3   Iron-Vitamin C 65-125 MG TABS Take 1 tablet by mouth 2 (two) times daily. 180 tablet 1   loratadine (CLARITIN) 10 MG tablet Take 1 tablet (10 mg total) by mouth daily. (Patient not taking: Reported on 02/03/2023) 90 tablet 3   metFORMIN (GLUCOPHAGE) 500 MG tablet TAKE 1 TABLET TWICE DAILY 180 tablet 3   metoprolol succinate (TOPROL-XL) 50 MG 24 hr tablet Take 50 mg by mouth every morning.     Omega-3 Fatty Acids (FISH OIL) 1000 MG CAPS Take 1,000 mg by mouth 2 (two) times daily.  (Patient not taking: Reported on 02/03/2023)     omeprazole (PRILOSEC) 40 MG capsule Take 40 mg by mouth every morning.      potassium chloride (KLOR-CON) 10 MEQ tablet TAKE 1 TABLET EVERY DAY 90 tablet 3   rivaroxaban (XARELTO) 20 MG TABS tablet Take 20 mg by mouth at bedtime.     rosuvastatin (CRESTOR) 20 MG tablet TAKE 1 TABLET EVERY DAY 90 tablet 3   tamsulosin (FLOMAX) 0.4 MG CAPS capsule Take 1 capsule (0.4 mg total) by mouth daily. 90 capsule 3   trimethoprim (TRIMPEX) 100 MG tablet Take 1 tablet (100 mg total) by mouth daily. 90 tablet 3   No current facility-administered medications for this visit.    Allergies:   Morphine and codeine, Ace inhibitors, Diazepam, and Tape    Social History:   reports that he has never smoked. He has never used smokeless tobacco. He reports current alcohol use of about 14.0 standard drinks of alcohol per week. He reports that he does not use drugs.   Family History:  family history includes Diabetes in his father; Heart disease in his mother; Leukemia in his father.    ROS:     Review of Systems  Constitutional: Negative.   HENT: Negative.    Eyes: Negative.   Respiratory: Negative.    Gastrointestinal: Negative.   Genitourinary: Negative.   Musculoskeletal: Negative.   Skin: Negative.   Neurological:  Positive for dizziness.  Endo/Heme/Allergies: Negative.    Psychiatric/Behavioral: Negative.    All other systems reviewed and are negative.     All other systems are reviewed and negative.    PHYSICAL EXAM: VS:  BP 111/73   Pulse 63   Ht 5\' 10"  (1.778 m)   Wt 252 lb 3.2 oz (114.4 kg)   SpO2 97%   BMI 36.19 kg/m  , BMI Body mass index is 36.19 kg/m. Last weight:  Wt Readings from Last 3 Encounters:  02/14/23 252 lb 3.2 oz (114.4 kg)  02/03/23 252 lb 12.8 oz (114.7 kg)  10/31/22 249 lb 12.8 oz (113.3 kg)     Physical Exam Vitals reviewed.  Constitutional:      Appearance: Normal appearance. He is normal weight.  HENT:     Head: Normocephalic.     Nose: Nose normal.     Mouth/Throat:     Mouth: Mucous membranes are moist.  Eyes:     Pupils: Pupils are equal, round, and reactive to light.  Cardiovascular:     Rate and Rhythm: Normal rate and regular rhythm.     Pulses: Normal pulses.     Heart sounds: Normal heart sounds.  Pulmonary:     Effort: Pulmonary effort is normal.  Abdominal:     General: Abdomen is flat. Bowel sounds are normal.  Musculoskeletal:        General: Normal range of motion.     Cervical back: Normal range of motion.  Skin:    General: Skin is warm.  Neurological:     General: No focal deficit present.     Mental Status: He is alert.  Psychiatric:        Mood and Affect: Mood normal.       EKG: sinus bradycardia 56/min frequent PVCS  Recent Labs: 02/01/2023: ALT 36; BUN 11; Creatinine, Ser 1.02; Hemoglobin 9.8; Platelets 152; Potassium 3.9; Sodium 142    Lipid Panel    Component Value Date/Time   CHOL 118 02/01/2023 0915   TRIG 124 02/01/2023 0915   HDL 66 02/01/2023 0915   CHOLHDL 1.8 02/01/2023 0915   LDLCALC 31 02/01/2023 0915      Other studies Reviewed: Additional studies/ records that were reviewed today include:  Review of the above records demonstrates:       No data to display            ASSESSMENT AND PLAN:    ICD-10-CM   1. Essential hypertension, benign   I10 PCV ECHOCARDIOGRAM COMPLETE    MYOCARDIAL PERFUSION IMAGING    Basic Metabolic Panel (BMET)    CBC with Differential/Platelet   Has orthostatic hyptension probably causing dizziness or PVC as EKG abnormal    2. Longstanding persistent atrial fibrillation (HCC)  I48.11 PCV ECHOCARDIOGRAM COMPLETE    MYOCARDIAL PERFUSION IMAGING    Basic Metabolic Panel (BMET)    CBC with Differential/Platelet    3. Mixed hyperlipidemia  E78.2 PCV ECHOCARDIOGRAM COMPLETE    MYOCARDIAL PERFUSION IMAGING    Basic Metabolic Panel (BMET)    CBC with Differential/Platelet    4. Obstructive sleep apnea  G47.33 PCV ECHOCARDIOGRAM COMPLETE    MYOCARDIAL PERFUSION IMAGING    Basic Metabolic Panel (BMET)    CBC with Differential/Platelet    5. Dizziness  R42 PCV ECHOCARDIOGRAM COMPLETE    MYOCARDIAL PERFUSION IMAGING    Basic Metabolic Panel (BMET)    CBC with Differential/Platelet   sinus bradycardia 56/min, frequent pvcs    6. Abnormal electrocardiogram (ECG) (EKG)  R94.31 PCV ECHOCARDIOGRAM COMPLETE    MYOCARDIAL PERFUSION IMAGING    Basic Metabolic Panel (BMET)    CBC with Differential/Platelet   sinus brady 56, frequent pvc,  decrease amio tp 100 mg daily or 1/2 tab. Do stress test echo    7. PVC (premature ventricular contraction)  I49.3 Basic Metabolic Panel (BMET)    CBC with Differential/Platelet   check cbc metc       Problem List Items Addressed This Visit       Cardiovascular and Mediastinum   Essential hypertension, benign - Primary   Relevant Orders   PCV ECHOCARDIOGRAM COMPLETE   MYOCARDIAL PERFUSION IMAGING   Basic Metabolic Panel (BMET)   CBC with Differential/Platelet   Atrial fibrillation (HCC)  Relevant Orders   PCV ECHOCARDIOGRAM COMPLETE   MYOCARDIAL PERFUSION IMAGING   Basic Metabolic Panel (BMET)   CBC with Differential/Platelet     Respiratory   Obstructive sleep apnea   Relevant Orders   PCV ECHOCARDIOGRAM COMPLETE   MYOCARDIAL PERFUSION IMAGING   Basic  Metabolic Panel (BMET)   CBC with Differential/Platelet     Other   Hyperlipidemia   Relevant Orders   PCV ECHOCARDIOGRAM COMPLETE   MYOCARDIAL PERFUSION IMAGING   Basic Metabolic Panel (BMET)   CBC with Differential/Platelet   Other Visit Diagnoses     Dizziness       sinus bradycardia 56/min, frequent pvcs   Relevant Orders   PCV ECHOCARDIOGRAM COMPLETE   MYOCARDIAL PERFUSION IMAGING   Basic Metabolic Panel (BMET)   CBC with Differential/Platelet   Abnormal electrocardiogram (ECG) (EKG)       sinus brady 56, frequent pvc,  decrease amio tp 100 mg daily or 1/2 tab. Do stress test echo   Relevant Orders   PCV ECHOCARDIOGRAM COMPLETE   MYOCARDIAL PERFUSION IMAGING   Basic Metabolic Panel (BMET)   CBC with Differential/Platelet   PVC (premature ventricular contraction)       check cbc metc   Relevant Orders   Basic Metabolic Panel (BMET)   CBC with Differential/Platelet          Disposition:   Return in about 4 weeks (around 03/14/2023) for echo, stress testr and f/u.    Total time spent: 50 minutes  Signed,  Adrian Blackwater, MD  02/14/2023 9:49 AM    [facility]

## 2023-02-15 ENCOUNTER — Other Ambulatory Visit: Payer: Self-pay | Admitting: Internal Medicine

## 2023-02-15 LAB — BASIC METABOLIC PANEL
BUN/Creatinine Ratio: 12 (ref 10–24)
BUN: 12 mg/dL (ref 8–27)
CO2: 22 mmol/L (ref 20–29)
Calcium: 9.2 mg/dL (ref 8.6–10.2)
Chloride: 103 mmol/L (ref 96–106)
Creatinine, Ser: 0.97 mg/dL (ref 0.76–1.27)
Glucose: 145 mg/dL — ABNORMAL HIGH (ref 70–99)
Potassium: 4 mmol/L (ref 3.5–5.2)
Sodium: 139 mmol/L (ref 134–144)
eGFR: 82 mL/min/{1.73_m2} (ref >59)

## 2023-02-15 LAB — CBC WITH DIFFERENTIAL/PLATELET
Basophils Absolute: 0 10*3/uL (ref 0.0–0.2)
Basos: 1 %
EOS (ABSOLUTE): 0.1 10*3/uL (ref 0.0–0.4)
Eos: 2 %
Hematocrit: 30.3 % — ABNORMAL LOW (ref 37.5–51.0)
Hemoglobin: 8.8 g/dL — ABNORMAL LOW (ref 13.0–17.7)
Immature Grans (Abs): 0 10*3/uL (ref 0.0–0.1)
Immature Granulocytes: 1 %
Lymphocytes Absolute: 0.7 10*3/uL (ref 0.7–3.1)
Lymphs: 18 %
MCH: 23.2 pg — ABNORMAL LOW (ref 26.6–33.0)
MCHC: 29 g/dL — ABNORMAL LOW (ref 31.5–35.7)
MCV: 80 fL (ref 79–97)
Monocytes Absolute: 0.4 10*3/uL (ref 0.1–0.9)
Monocytes: 11 %
Neutrophils Absolute: 2.6 10*3/uL (ref 1.4–7.0)
Neutrophils: 67 %
Platelets: 151 10*3/uL (ref 150–450)
RBC: 3.8 x10E6/uL — ABNORMAL LOW (ref 4.14–5.80)
RDW: 15.8 % — ABNORMAL HIGH (ref 11.6–15.4)
WBC: 3.8 10*3/uL (ref 3.4–10.8)

## 2023-02-16 ENCOUNTER — Encounter: Payer: Self-pay | Admitting: Cardiovascular Disease

## 2023-03-07 ENCOUNTER — Ambulatory Visit (INDEPENDENT_AMBULATORY_CARE_PROVIDER_SITE_OTHER): Payer: Medicare HMO | Admitting: Internal Medicine

## 2023-03-07 ENCOUNTER — Encounter: Payer: Self-pay | Admitting: Internal Medicine

## 2023-03-07 VITALS — BP 130/74 | HR 67 | Ht 70.0 in | Wt 253.8 lb

## 2023-03-07 DIAGNOSIS — K648 Other hemorrhoids: Secondary | ICD-10-CM | POA: Insufficient documentation

## 2023-03-07 DIAGNOSIS — D500 Iron deficiency anemia secondary to blood loss (chronic): Secondary | ICD-10-CM

## 2023-03-07 DIAGNOSIS — J301 Allergic rhinitis due to pollen: Secondary | ICD-10-CM | POA: Insufficient documentation

## 2023-03-07 DIAGNOSIS — E1159 Type 2 diabetes mellitus with other circulatory complications: Secondary | ICD-10-CM | POA: Diagnosis not present

## 2023-03-07 DIAGNOSIS — E119 Type 2 diabetes mellitus without complications: Secondary | ICD-10-CM

## 2023-03-07 DIAGNOSIS — G4733 Obstructive sleep apnea (adult) (pediatric): Secondary | ICD-10-CM

## 2023-03-07 DIAGNOSIS — I152 Hypertension secondary to endocrine disorders: Secondary | ICD-10-CM | POA: Diagnosis not present

## 2023-03-07 LAB — POCT CBG (FASTING - GLUCOSE)-MANUAL ENTRY: Glucose Fasting, POC: 144 mg/dL — AB (ref 70–99)

## 2023-03-07 NOTE — Progress Notes (Signed)
Established Patient Office Visit  Subjective:  Patient ID: Brandon Gibson, male    DOB: [birth year]  Age: 72 y.o. MRN: [id number]  Chief Complaint  Patient presents with   Follow-up    1 month follow up    Patient comes in for his follow-up today.  He continues to have intermittent bleeding from his hemorrhoids and his hemoglobin has been dropping.  He is taking his iron supplement regularly.  He was supposed to consider seriously about getting hemorrhoidal surgery done but still has not decided.  He does complain of some postural hypotension and dizziness when he gets up from a sitting position, did not start wearing compression stockings. Will repeat his CBC today, and he will thank about calling the surgeons office to discuss setting up a date.  He is concerned about availability of postop care at his home. Complains of feeling tired but he denies chest pain, shortness of breath or palpitations.    No other concerns at this time.   Past Medical History:  Diagnosis Date   Arthritis    Atrial fibrillation (HCC)    BPH (benign prostatic hyperplasia)    Cancer (HCC)    HX SKIN CANCER Basal cell   Chickenpox    Clotting disorder (HCC)    Diabetes mellitus without complication (HCC)    type 2   Dysrhythmia    IRREG HEART BEAT   GERD (gastroesophageal reflux disease)    H/O pleurisy    Hypercholesteremia    Hyperlipidemia    Hypertension    Iron deficiency anemia due to chronic blood loss 09/18/2017   Lumbar stenosis    Measles    Mumps    Sleep apnea    sleep study Dr. Park Breed, uses CPAP    Past Surgical History:  Procedure Laterality Date   ANTERIOR CERVICAL DECOMP/DISCECTOMY FUSION  11/25/2011   Procedure: ANTERIOR CERVICAL DECOMPRESSION/DISCECTOMY FUSION 2 LEVELS;  Surgeon: Karn Cassis, MD;  Location: [facility];  Service: Neurosurgery;  Laterality: N/A;  Cervical four-five,Cervical five-six  Anterior cervical decompression/diskectomy, fusion, plate   BREAST  BIOPSY Right    Benign   BREAST SURGERY Left 1986   lumpectomy   CARDIAC CATHETERIZATION     2011, [facility]   CARDIOVASCULAR STRESS TEST  2011   CERVICAL FUSION  1988   C 6/7    COLONOSCOPY WITH PROPOFOL N/A 11/08/2017   Procedure: COLONOSCOPY WITH PROPOFOL;  Surgeon: Toledo, Boykin Nearing, MD;  Location: [facility];  Service: Gastroenterology;  Laterality: N/A;   ESOPHAGOGASTRODUODENOSCOPY (EGD) WITH PROPOFOL N/A 11/08/2017   Procedure: ESOPHAGOGASTRODUODENOSCOPY (EGD) WITH PROPOFOL;  Surgeon: Toledo, Boykin Nearing, MD;  Location: [facility];  Service: Gastroenterology;  Laterality: N/A;   EYE SURGERY     LASIK   JOINT REPLACEMENT     KNEE ARTHROPLASTY Right 10/05/2015   Procedure: COMPUTER ASSISTED TOTAL KNEE ARTHROPLASTY;  Surgeon: Donato Heinz, MD;  Location: [facility];  Service: Orthopedics;  Laterality: Right;   KNEE ARTHROSCOPY  1986   Right   KNEE ARTHROSCOPY Left 08/10/2015   Procedure: LEFT KNEE ARTHROSCOPY, CHONDROPLASTY, MEDIAL MENISECTOMY;  Surgeon: Donato Heinz, MD;  Location: [facility];  Service: Orthopedics;  Laterality: Left;   LAMINECTOMY WITH POSTERIOR LATERAL ARTHRODESIS LEVEL 2 N/A 07/16/2018   Procedure: Posterior lumbar fusion with instrumentation at L3-4 with repeat facetectomy L3-4;  Surgeon: Tia Alert, MD;  Location: [facility];  Service: Neurosurgery;  Laterality: N/A;  Posterior lumbar fusion with instrumentation at L3-4 with repeat facetectomy  L3-4   LUMBAR LAMINECTOMY/DECOMPRESSION MICRODISCECTOMY N/A 10/04/2013   Procedure: LUMBAR TWO TO THREE LUMBAR LAMINECTOMY/DECOMPRESSION MICRODISCECTOMY 1 LEVEL;  Surgeon: Karn Cassis, MD;  Location: [facility];  Service: Neurosurgery;  Laterality: N/A;  L2-3 Laminectomy   POSTERIOR LAMINECTOMY / DECOMPRESSION LUMBAR SPINE  2006   TRANSESOPHAGEAL ECHOCARDIOGRAM  2011    Social History   Socioeconomic History   Marital status: Widowed    Spouse name: Not on file   Number of children: Not on file   Years of  education: Not on file   Highest education level: Not on file  Occupational History   Not on file  Tobacco Use   Smoking status: Never   Smokeless tobacco: Never  Vaping Use   Vaping status: Never Used  Substance and Sexual Activity   Alcohol use: Yes    Alcohol/week: 14.0 standard drinks of alcohol    Types: 14 Glasses of wine per week   Drug use: No   Sexual activity: Not on file  Other Topics Concern   Not on file  Social History Narrative   Not on file   Social Determinants of Health   Financial Resource Strain: Not on file  Food Insecurity: Not on file  Transportation Needs: Not on file  Physical Activity: Not on file  Stress: Not on file  Social Connections: Not on file  Intimate Partner Violence: Not on file    Family History  Problem Relation Age of Onset   Leukemia Father    Diabetes Father    Heart disease Mother    Prostate cancer Neg Hx    Chronic Renal Failure Neg Hx    Breast cancer Neg Hx     Allergies  Allergen Reactions   Morphine And Codeine Anaphylaxis   Ace Inhibitors Swelling    Other reaction(s): Unknown   Diazepam    Tape     Review of Systems  Constitutional:  Positive for malaise/fatigue. Negative for chills, diaphoresis, fever and weight loss.  HENT:  Positive for sore throat.   Eyes: Negative.   Respiratory: Negative.  Negative for cough and shortness of breath.   Cardiovascular: Negative.  Negative for chest pain, palpitations and leg swelling.  Gastrointestinal:  Positive for blood in stool. Negative for abdominal pain, constipation, diarrhea, heartburn, melena, nausea and vomiting.  Genitourinary: Negative.  Negative for dysuria and flank pain.  Musculoskeletal: Negative.  Negative for joint pain and myalgias.  Skin: Negative.   Neurological:  Positive for dizziness. Negative for tingling and headaches.  Endo/Heme/Allergies: Negative.   Psychiatric/Behavioral: Negative.  Negative for depression and suicidal ideas. The patient  is not nervous/anxious.        Objective:   BP 130/74   Pulse 67   Ht 5\' 10"  (1.778 m)   Wt 253 lb 12.8 oz (115.1 kg)   SpO2 98%   BMI 36.42 kg/m   Vitals:   03/07/23 0856  BP: 130/74  Pulse: 67  Height: 5\' 10"  (1.778 m)  Weight: 253 lb 12.8 oz (115.1 kg)  SpO2: 98%  BMI (Calculated): 36.42    Physical Exam Vitals and nursing note reviewed.  Constitutional:      Appearance: Normal appearance.  HENT:     Head: Normocephalic and atraumatic.     Nose: Nose normal.     Mouth/Throat:     Mouth: Mucous membranes are moist.     Pharynx: Oropharynx is clear.  Eyes:     Conjunctiva/sclera: Conjunctivae normal.  Pupils: Pupils are equal, round, and reactive to light.  Cardiovascular:     Rate and Rhythm: Normal rate and regular rhythm.     Pulses: Normal pulses.     Heart sounds: Normal heart sounds.  Pulmonary:     Effort: Pulmonary effort is normal.     Breath sounds: Normal breath sounds.  Abdominal:     General: Bowel sounds are normal.     Palpations: Abdomen is soft.  Musculoskeletal:        General: Normal range of motion.     Cervical back: Normal range of motion.  Skin:    General: Skin is warm and dry.  Neurological:     General: No focal deficit present.     Mental Status: He is alert and oriented to person, place, and time.  Psychiatric:        Mood and Affect: Mood normal.        Behavior: Behavior normal.        Judgment: Judgment normal.      Results for orders placed or performed in visit on 03/07/23  POCT CBG (Fasting - Glucose)  Result Value Ref Range   Glucose Fasting, POC 144 (A) 70 - 99 mg/dL        Assessment & Plan:  Repeat CBC and iron studies today.  Will call patient with results.  Meanwhile continue all medications. Problem List Items Addressed This Visit     Iron deficiency anemia due to chronic blood loss   Relevant Orders   CBC with Diff   Iron and IBC ([id number])   Ferritin   Diabetes mellitus type 2,  uncomplicated (HCC)   Relevant Orders   POCT CBG (Fasting - Glucose) (Completed)   Obstructive sleep apnea   Other hemorrhoids   Hypertension associated with diabetes (HCC) - Primary   Seasonal allergic rhinitis due to pollen    Return in about 3 months (around 06/07/2023).   Total time spent: 30 minutes  Margaretann Loveless, MD  03/07/2023   This document may have been prepared by [facility] Voice Recognition software and as such may include unintentional dictation errors.

## 2023-03-08 LAB — CBC WITH DIFFERENTIAL/PLATELET
Basophils Absolute: 0 10*3/uL (ref 0.0–0.2)
Basos: 1 %
EOS (ABSOLUTE): 0.1 10*3/uL (ref 0.0–0.4)
Eos: 2 %
Hematocrit: 31.4 % — ABNORMAL LOW (ref 37.5–51.0)
Hemoglobin: 8.8 g/dL — ABNORMAL LOW (ref 13.0–17.7)
Immature Grans (Abs): 0 10*3/uL (ref 0.0–0.1)
Immature Granulocytes: 0 %
Lymphocytes Absolute: 0.8 10*3/uL (ref 0.7–3.1)
Lymphs: 25 %
MCH: 21.5 pg — ABNORMAL LOW (ref 26.6–33.0)
MCHC: 28 g/dL — ABNORMAL LOW (ref 31.5–35.7)
MCV: 77 fL — ABNORMAL LOW (ref 79–97)
Monocytes Absolute: 0.4 10*3/uL (ref 0.1–0.9)
Monocytes: 11 %
Neutrophils Absolute: 2.1 10*3/uL (ref 1.4–7.0)
Neutrophils: 61 %
Platelets: 159 10*3/uL (ref 150–450)
RBC: 4.09 x10E6/uL — ABNORMAL LOW (ref 4.14–5.80)
RDW: 15.6 % — ABNORMAL HIGH (ref 11.6–15.4)
WBC: 3.4 10*3/uL (ref 3.4–10.8)

## 2023-03-08 LAB — IRON AND TIBC
Iron Saturation: 10 % — ABNORMAL LOW (ref 15–55)
Iron: 55 ug/dL (ref 38–169)
Total Iron Binding Capacity: 524 ug/dL — ABNORMAL HIGH (ref 250–450)
UIBC: 469 ug/dL — ABNORMAL HIGH (ref 111–343)

## 2023-03-08 LAB — FERRITIN: Ferritin: 12 ng/mL — ABNORMAL LOW (ref 30–400)

## 2023-03-09 ENCOUNTER — Other Ambulatory Visit: Payer: Self-pay

## 2023-03-09 ENCOUNTER — Other Ambulatory Visit: Payer: Self-pay | Admitting: Internal Medicine

## 2023-03-09 DIAGNOSIS — D500 Iron deficiency anemia secondary to blood loss (chronic): Secondary | ICD-10-CM

## 2023-03-09 MED ORDER — METFORMIN HCL 500 MG PO TABS: 500.0000 mg | ORAL_TABLET | Freq: Two times a day (BID) | ORAL | 0 refills | Status: DC

## 2023-03-13 ENCOUNTER — Ambulatory Visit (INDEPENDENT_AMBULATORY_CARE_PROVIDER_SITE_OTHER): Payer: Medicare HMO

## 2023-03-13 DIAGNOSIS — E782 Mixed hyperlipidemia: Secondary | ICD-10-CM

## 2023-03-13 DIAGNOSIS — I4811 Longstanding persistent atrial fibrillation: Secondary | ICD-10-CM

## 2023-03-13 DIAGNOSIS — I351 Nonrheumatic aortic (valve) insufficiency: Secondary | ICD-10-CM

## 2023-03-13 DIAGNOSIS — I361 Nonrheumatic tricuspid (valve) insufficiency: Secondary | ICD-10-CM

## 2023-03-13 DIAGNOSIS — R42 Dizziness and giddiness: Secondary | ICD-10-CM

## 2023-03-13 DIAGNOSIS — I422 Other hypertrophic cardiomyopathy: Secondary | ICD-10-CM | POA: Diagnosis not present

## 2023-03-13 DIAGNOSIS — I10 Essential (primary) hypertension: Secondary | ICD-10-CM

## 2023-03-13 DIAGNOSIS — G4733 Obstructive sleep apnea (adult) (pediatric): Secondary | ICD-10-CM

## 2023-03-13 DIAGNOSIS — R9431 Abnormal electrocardiogram [ECG] [EKG]: Secondary | ICD-10-CM

## 2023-03-15 ENCOUNTER — Ambulatory Visit (INDEPENDENT_AMBULATORY_CARE_PROVIDER_SITE_OTHER): Payer: Medicare HMO

## 2023-03-15 DIAGNOSIS — R42 Dizziness and giddiness: Secondary | ICD-10-CM

## 2023-03-15 DIAGNOSIS — I10 Essential (primary) hypertension: Secondary | ICD-10-CM

## 2023-03-15 DIAGNOSIS — G4733 Obstructive sleep apnea (adult) (pediatric): Secondary | ICD-10-CM

## 2023-03-15 DIAGNOSIS — I4811 Longstanding persistent atrial fibrillation: Secondary | ICD-10-CM

## 2023-03-15 DIAGNOSIS — R9431 Abnormal electrocardiogram [ECG] [EKG]: Secondary | ICD-10-CM

## 2023-03-15 DIAGNOSIS — E782 Mixed hyperlipidemia: Secondary | ICD-10-CM

## 2023-03-15 MED ORDER — TECHNETIUM TC 99M SESTAMIBI GENERIC - CARDIOLITE
10.5000 | Freq: Once | INTRAVENOUS | Status: AC | PRN
  Administered 2023-03-15: 10.5 via INTRAVENOUS

## 2023-03-15 MED ORDER — TECHNETIUM TC 99M SESTAMIBI GENERIC - CARDIOLITE
34.2000 | Freq: Once | INTRAVENOUS | Status: AC | PRN
  Administered 2023-03-15: 34.2 via INTRAVENOUS

## 2023-03-16 ENCOUNTER — Encounter: Payer: Self-pay | Admitting: Internal Medicine

## 2023-03-16 ENCOUNTER — Inpatient Hospital Stay: Payer: Medicare HMO | Attending: Internal Medicine | Admitting: Internal Medicine

## 2023-03-16 ENCOUNTER — Inpatient Hospital Stay: Payer: Medicare HMO

## 2023-03-16 VITALS — BP 123/68 | HR 65 | Temp 97.8°F | Resp 18 | Ht 70.0 in | Wt 265.4 lb

## 2023-03-16 DIAGNOSIS — Z8249 Family history of ischemic heart disease and other diseases of the circulatory system: Secondary | ICD-10-CM | POA: Diagnosis not present

## 2023-03-16 DIAGNOSIS — Z85828 Personal history of other malignant neoplasm of skin: Secondary | ICD-10-CM | POA: Insufficient documentation

## 2023-03-16 DIAGNOSIS — I10 Essential (primary) hypertension: Secondary | ICD-10-CM | POA: Insufficient documentation

## 2023-03-16 DIAGNOSIS — D649 Anemia, unspecified: Secondary | ICD-10-CM | POA: Insufficient documentation

## 2023-03-16 DIAGNOSIS — G4730 Sleep apnea, unspecified: Secondary | ICD-10-CM | POA: Diagnosis not present

## 2023-03-16 DIAGNOSIS — K219 Gastro-esophageal reflux disease without esophagitis: Secondary | ICD-10-CM | POA: Diagnosis not present

## 2023-03-16 DIAGNOSIS — R5383 Other fatigue: Secondary | ICD-10-CM | POA: Diagnosis not present

## 2023-03-16 DIAGNOSIS — Z7901 Long term (current) use of anticoagulants: Secondary | ICD-10-CM | POA: Insufficient documentation

## 2023-03-16 DIAGNOSIS — Z806 Family history of leukemia: Secondary | ICD-10-CM | POA: Insufficient documentation

## 2023-03-16 DIAGNOSIS — R0602 Shortness of breath: Secondary | ICD-10-CM | POA: Insufficient documentation

## 2023-03-16 DIAGNOSIS — Z885 Allergy status to narcotic agent status: Secondary | ICD-10-CM | POA: Diagnosis not present

## 2023-03-16 DIAGNOSIS — E119 Type 2 diabetes mellitus without complications: Secondary | ICD-10-CM | POA: Diagnosis not present

## 2023-03-16 DIAGNOSIS — E611 Iron deficiency: Secondary | ICD-10-CM | POA: Diagnosis not present

## 2023-03-16 DIAGNOSIS — Z79899 Other long term (current) drug therapy: Secondary | ICD-10-CM | POA: Insufficient documentation

## 2023-03-16 DIAGNOSIS — I4891 Unspecified atrial fibrillation: Secondary | ICD-10-CM | POA: Insufficient documentation

## 2023-03-16 DIAGNOSIS — Z833 Family history of diabetes mellitus: Secondary | ICD-10-CM | POA: Insufficient documentation

## 2023-03-16 NOTE — Progress Notes (Signed)
[facility] CONSULT NOTE  Patient Care Team: Margaretann Loveless, MD as PCP - General (Internal Medicine) Earna Coder, MD as Consulting Physician (Internal Medicine)  CHIEF COMPLAINTS/PURPOSE OF CONSULTATION: ANEMIA   HEMATOLOGY HISTORY  # ANEMIA[Hb; MCV-platelets- WBC; Iron sat; ferritin;  GFR- CT/US- ;   HISTORY OF PRESENTING ILLNESS:  Brandon Gibson 72 y.o.  male pleasant patient is history of diabetes and also A-fib on Xarelto been referred to [place] for further evaluation of anemia.  Patient complains of shortness of breath with exertion.  Also complains of excessive fatigue.  Complains of dizziness especially on standing.   Pt has seen Dr Cathie Hoops in the past for same diagnosis.  However she needed IV iron infusion.  Patient had multiple colonoscopies last in 2019; also including prior EGD/capsule study-negative.  Patient has been on oral iron.  However most recently on blood work he has dropped his hgb back to 8's.   Patient admits to chronic bleeding hemorrhoids.   Blood in stools: EGD/colonoscopy-[last 2020]- chronic hemorrhoidal bleeding Blood in urine: none Difficulty swallowing:none Change of bowel movement/constipation:none Prior blood transfusion:none Kidney/Liver disease:none Alcohol: none Bariatric surgery:none  Prior evaluation with hematology:yes. Prior bone marrow biopsy: none Oral iron: yes.  Prior IV iron infusions: yes.   Review of Systems  Constitutional:  Positive for malaise/fatigue. Negative for chills, diaphoresis, fever and weight loss.  HENT:  Negative for nosebleeds and sore throat.   Eyes:  Negative for double vision.  Respiratory:  Negative for cough, hemoptysis, sputum production, shortness of breath and wheezing.   Cardiovascular:  Negative for chest pain, palpitations, orthopnea and leg swelling.  Gastrointestinal:  Negative for abdominal pain, blood in stool, constipation, diarrhea, heartburn, melena, nausea and vomiting.   Genitourinary:  Negative for dysuria, frequency and urgency.  Musculoskeletal:  Negative for back pain and joint pain.  Skin: Negative.  Negative for itching and rash.  Neurological:  Negative for dizziness, tingling, focal weakness, weakness and headaches.  Endo/Heme/Allergies:  Does not bruise/bleed easily.  Psychiatric/Behavioral:  Negative for depression. The patient is not nervous/anxious and does not have insomnia.      MEDICAL HISTORY:  Past Medical History:  Diagnosis Date   Arthritis    Atrial fibrillation (HCC)    BPH (benign prostatic hyperplasia)    Cancer (HCC)    HX SKIN CANCER Basal cell   Chickenpox    Clotting disorder (HCC)    Diabetes mellitus without complication (HCC)    type 2   Dysrhythmia    IRREG HEART BEAT   GERD (gastroesophageal reflux disease)    H/O pleurisy    Hypercholesteremia    Hyperlipidemia    Hypertension    Iron deficiency anemia due to chronic blood loss 09/18/2017   Lumbar stenosis    Measles    Mumps    Sleep apnea    sleep study Dr. Park Breed, uses CPAP    SURGICAL HISTORY: Past Surgical History:  Procedure Laterality Date   ANTERIOR CERVICAL DECOMP/DISCECTOMY FUSION  11/25/2011   Procedure: ANTERIOR CERVICAL DECOMPRESSION/DISCECTOMY FUSION 2 LEVELS;  Surgeon: Karn Cassis, MD;  Location: [facility];  Service: Neurosurgery;  Laterality: N/A;  Cervical four-five,Cervical five-six  Anterior cervical decompression/diskectomy, fusion, plate   BREAST BIOPSY Right    Benign   BREAST SURGERY Left 1986   lumpectomy   CARDIAC CATHETERIZATION     2011, [facility]   CARDIOVASCULAR STRESS TEST  2011   CERVICAL FUSION  1988   C 6/7  COLONOSCOPY WITH PROPOFOL N/A 11/08/2017   Procedure: COLONOSCOPY WITH PROPOFOL;  Surgeon: Toledo, Boykin Nearing, MD;  Location: [facility];  Service: Gastroenterology;  Laterality: N/A;   ESOPHAGOGASTRODUODENOSCOPY (EGD) WITH PROPOFOL N/A 11/08/2017   Procedure: ESOPHAGOGASTRODUODENOSCOPY (EGD) WITH  PROPOFOL;  Surgeon: Toledo, Boykin Nearing, MD;  Location: [facility] END[facility]COPY;  Service: Gastroenterology;  Laterality: N/A;   EYE SURGERY     LASIK   JOINT REPLACEMENT     KNEE ARTHROPLASTY Right 10/05/2015   Procedure: COMPUTER ASSISTED TOTAL KNEE ARTHROPLASTY;  Surgeon: Donato Heinz, MD;  Location: [facility] ORS;  Service: Orthopedics;  Laterality: Right;   KNEE ARTHROSCOPY  1986   Right   KNEE ARTHROSCOPY Left 08/10/2015   Procedure: LEFT KNEE ARTHROSCOPY, CHONDROPLASTY, MEDIAL MENISECTOMY;  Surgeon: Donato Heinz, MD;  Location: [facility] ORS;  Service: Orthopedics;  Laterality: Left;   LAMINECTOMY WITH POSTERIOR LATERAL ARTHRODESIS LEVEL 2 N/A 07/16/2018   Procedure: Posterior lumbar fusion with instrumentation at L3-4 with repeat facetectomy L3-4;  Surgeon: Tia Alert, MD;  Location: [facility] OR;  Service: Neurosurgery;  Laterality: N/A;  Posterior lumbar fusion with instrumentation at L3-4 with repeat facetectomy L3-4   LUMBAR LAMINECTOMY/DECOMPRESSION MICRODISCECTOMY N/A 10/04/2013   Procedure: LUMBAR TWO TO THREE LUMBAR LAMINECTOMY/DECOMPRESSION MICRODISCECTOMY 1 LEVEL;  Surgeon: Karn Cassis, MD;  Location: [facility];  Service: Neurosurgery;  Laterality: N/A;  L2-3 Laminectomy   POSTERIOR LAMINECTOMY / DECOMPRESSION LUMBAR SPINE  2006   TRANSESOPHAGEAL ECHOCARDIOGRAM  2011    SOCIAL HISTORY: Social History   Socioeconomic History   Marital status: Widowed    Spouse name: Not on file   Number of children: Not on file   Years of education: Not on file   Highest education level: Not on file  Occupational History   Not on file  Tobacco Use   Smoking status: Never   Smokeless tobacco: Never  Vaping Use   Vaping status: Never Used  Substance and Sexual Activity   Alcohol use: Yes    Alcohol/week: 14.0 standard drinks of alcohol    Types: 14 Glasses of wine per week   Drug use: No   Sexual activity: Not on file  Other Topics Concern   Not on file  Social History Narrative   Not on  file   Social Determinants of Health   Financial Resource Strain: Not on file  Food Insecurity: No Food Insecurity (03/16/2023)   Hunger Vital Sign    Worried About Running Out of Food in the Last Year: Never true    Ran Out of Food in the Last Year: Never true  Transportation Needs: No Transportation Needs (03/16/2023)   PRAPARE - Administrator, Civil Service (Medical): No    Lack of Transportation (Non-Medical): No  Physical Activity: Not on file  Stress: Not on file  Social Connections: Not on file  Intimate Partner Violence: Not At Risk (03/16/2023)   Humiliation, Afraid, Rape, and Kick questionnaire    Fear of Current or Ex-Partner: No    Emotionally Abused: No    Physically Abused: No    Sexually Abused: No    FAMILY HISTORY: Family History  Problem Relation Age of Onset   Leukemia Father    Diabetes Father    Heart disease Mother    Prostate cancer Neg Hx    Chronic Renal Failure Neg Hx    Breast cancer Neg Hx     ALLERGIES:  is allergic to morphine and codeine, ace inhibitors, diazepam, and tape.  MEDICATIONS:  Current Outpatient Medications  Medication Sig Dispense Refill   amiodarone (PACERONE) 200 MG tablet TAKE 1 TABLET EVERY DAY (Patient taking differently: Take 200 mg by mouth daily. Takes 1/2 tablet daily) 90 tablet 3   chlorthalidone (HYGROTON) 25 MG tablet TAKE 1 TABLET EVERY DAY 90 tablet 3   docusate (COLACE) 50 MG/5ML liquid Take by mouth daily.     fluticasone (FLONASE) 50 MCG/ACT nasal spray Place 2 sprays into both nostrils daily.     glucose blood (ACCU-CHEK AVIVA PLUS) test strip TEST BLOOD SUGAR EVERY DAY 100 strip 3   hydrALAZINE (APRESOLINE) 50 MG tablet TAKE 1 TABLET TWICE DAILY 180 tablet 3   Iron-Vitamin C 65-125 MG TABS Take 1 tablet by mouth 2 (two) times daily. 180 tablet 1   loratadine (CLARITIN) 10 MG tablet Take 1 tablet (10 mg total) by mouth daily. 90 tablet 3   metFORMIN (GLUCOPHAGE) 500 MG tablet Take 500 mg by mouth  once. 2 tablets in morning 1 tablet at night     metoprolol succinate (TOPROL-XL) 50 MG 24 hr tablet TAKE 1 TABLET EVERY DAY 90 tablet 3   omeprazole (PRILOSEC) 40 MG capsule TAKE 1 CAPSULE EVERY DAY 1/2 HOUR BEFORE A MEAL 90 capsule 3   potassium chloride (KLOR-CON) 10 MEQ tablet TAKE 1 TABLET EVERY DAY 90 tablet 3   rivaroxaban (XARELTO) 20 MG TABS tablet Take 20 mg by mouth at bedtime.     rosuvastatin (CRESTOR) 20 MG tablet TAKE 1 TABLET EVERY DAY 90 tablet 3   tamsulosin (FLOMAX) 0.4 MG CAPS capsule Take 1 capsule (0.4 mg total) by mouth daily. 90 capsule 3   trimethoprim (TRIMPEX) 100 MG tablet Take 1 tablet (100 mg total) by mouth daily. 90 tablet 3   b complex vitamins tablet Take 1 tablet by mouth daily. (Patient not taking: Reported on 02/03/2023)     Omega-3 Fatty Acids (FISH OIL) 1000 MG CAPS Take 1,000 mg by mouth 2 (two) times daily.  (Patient not taking: Reported on 02/03/2023)     No current facility-administered medications for this visit.     PHYSICAL EXAMINATION:   Vitals:   03/16/23 1107  BP: 123/68  Pulse: 65  Resp: 18  Temp: 97.8 F (36.6 C)  SpO2: 98%   Filed Weights   03/16/23 1107  Weight: 265 lb 6.4 oz (120.4 kg)    Physical Exam Vitals and nursing note reviewed.  HENT:     Head: Normocephalic and atraumatic.     Mouth/Throat:     Pharynx: Oropharynx is clear.  Eyes:     Extraocular Movements: Extraocular movements intact.     Pupils: Pupils are equal, round, and reactive to light.  Cardiovascular:     Rate and Rhythm: Normal rate and regular rhythm.  Pulmonary:     Comments: Decreased breath sounds bilaterally.  Abdominal:     Palpations: Abdomen is soft.  Musculoskeletal:        General: Normal range of motion.     Cervical back: Normal range of motion.  Skin:    General: Skin is warm.  Neurological:     General: No focal deficit present.     Mental Status: He is alert and oriented to person, place, and time.  Psychiatric:         Behavior: Behavior normal.        Judgment: Judgment normal.      LABORATORY DATA:  I have reviewed the data as listed Lab Results  Component  Value Date   WBC 3.4 03/07/2023   HGB 8.8 (L) 03/07/2023   HCT 31.4 (L) 03/07/2023   MCV 77 (L) 03/07/2023   PLT 159 03/07/2023   Recent Labs    06/23/22 1041 10/25/22 1137 02/01/23 0915 02/14/23 0957  NA 137 138 142 139  K 3.8 3.5 3.9 4.0  CL 99 100 103 103  CO2 22 25 22 22   GLUCOSE 142* 115* 146* 145*  BUN 12 9 11 12   CREATININE 0.98 1.01 1.02 0.97  CALCIUM 9.3 8.9 8.6 9.2  PROT 6.0 5.9* 5.5*  --   ALBUMIN 4.2 4.1 4.1  --   AST 38 38 39  --   ALT 47* 35 36  --   ALKPHOS 82 64 58  --   BILITOT 0.5 0.4 0.3  --      PCV ECHOCARDIOGRAM COMPLETE  Result Date: 03/14/2023 Images from the original result were not included. Reason for Visit  INDICATIONS:   I10 Echocardiogram: An echocardiogram in (2-d) mode was performed and in Doppler mode with color flow velocity mapping was performed. ventricular septum thickness 0.942 cm, L ventricular posterior wall thickness (diastole) 1.72 cm, left atrium size 5.7 cm, aortic root diameter 3.7 cm, L ventricle diastolic dimension 4.55 cm, L ventricle systolic dimension 3.11, L ventricle ejection fraction 59.7 %, and LV fractional shortening 31.6 % L ventricular outflow tract internal diameter 3.7 cm, L ventricular outflow tract flow velocity 1.09 m/s, aortic valve cusps 1.9 cm , aortic valve flow velocity 1.56 (m/sec), aortic valve systolic calculated mean flow gradient 6 mmHg, mitral valve diastolic peak flow velocity E .972 m/sec, and mitral valve diastolic peak flow E/A ratio 1.0 Mitral valve has mild regurgitation Tricuspid valve has trace regurgitation ASSESSMENT Technically difficult study due to body habitus. Suboptimal study due to poor windows. Normal chamber sizes. Normal left ventricular systolic function. Mild left ventricular hypertrophy with GRADE 1 (relaxation abnormality) diastolic  dysfunction. Normal right ventricular systolic function. Normal right ventricular diastolic function. Normal left ventricular wall motion. Normal right ventricular wall motion. Trace tricuspid regurgitation. Normal pulmonary artery pressure. Mild mitral regurgitation. No pericardial effusion. Mild left ventricle hypertrophy    ASSESSMENT & PLAN:   Symptomatic anemia # Iron deficiency anemia- Hb 8 [PCP-NOV 2024-ferritin-12; I sat-10]-symptomatic. Etiology GI blood loss-?  Hemorrhoidal -/patient on Xarelto.  Lack of improvement on oral iron.   #  Discussed regarding IV iron infusion/Venofer. Discussed the potential acute infusion reactions with IV iron; which are quite rare.  Patient understands the risk; will proceed with infusions.   #Etiology of iron deficiency: Unclear;-  s/p previous d GI evaluation-EGD colonoscopy; Charlynne Cousins study [2019]- [facility]- ?  Hemorrhoidal as per patient-patient awaiting evaluation with surgery at the [facility] clinic.  # A.fib on xarelo [Dr.Khan]-monitor closely for anemia.   Thank you Dr. Welton Flakes for allowing me to participate in the care of your pleasant patient. Please do not hesitate to contact me with questions or concerns in the interim.  # DISPOSITION: # NO labs today # weekly venofer x3- start next week  # follow up  2 month- MD; labs- cbc/bmp;LDH; iron studies; ferritin- possible venofer- Dr.B    All questions were answered. The patient knows to call the clinic with any problems, questions or concerns.    Earna Coder, MD 03/16/2023 12:11 PM

## 2023-03-16 NOTE — Assessment & Plan Note (Addendum)
#   Iron deficiency anemia- Hb 8 [PCP-NOV 2024-ferritin-12; I sat-10]-symptomatic. Etiology GI blood loss-?  Hemorrhoidal -/patient on Xarelto.  Lack of improvement on oral iron.   #  Discussed regarding IV iron infusion/Venofer. Discussed the potential acute infusion reactions with IV iron; which are quite rare.  Patient understands the risk; will proceed with infusions.   #Etiology of iron deficiency: Unclear;-  s/p previous d GI evaluation-EGD colonoscopy; Charlynne Cousins study [2019]- [facility]- ?  Hemorrhoidal as per patient-patient awaiting evaluation with surgery at the [facility] clinic.  # A.fib on xarelo [Dr.Khan]-monitor closely for anemia.   Thank you Dr. Welton Flakes for allowing me to participate in the care of your pleasant patient. Please do not hesitate to contact me with questions or concerns in the interim.  # DISPOSITION: # NO labs today # weekly venofer x3- start next week  # follow up  2 month- MD; labs- cbc/bmp;LDH; iron studies; ferritin- possible venofer- Dr.B

## 2023-03-16 NOTE — Progress Notes (Signed)
Pt has seen Dr Cathie Hoops in the past for same diagnosis. He has dropped his hgb back to 8's. Bleeding hemorrhoids is a problem. He is seeing a Careers adviser. Lethargy, dyspnea with exertion.. Lightheaded with standing.Appetite is good.

## 2023-03-20 DIAGNOSIS — H43813 Vitreous degeneration, bilateral: Secondary | ICD-10-CM | POA: Diagnosis not present

## 2023-03-20 DIAGNOSIS — H04123 Dry eye syndrome of bilateral lacrimal glands: Secondary | ICD-10-CM | POA: Diagnosis not present

## 2023-03-20 DIAGNOSIS — E119 Type 2 diabetes mellitus without complications: Secondary | ICD-10-CM | POA: Diagnosis not present

## 2023-03-20 DIAGNOSIS — H2513 Age-related nuclear cataract, bilateral: Secondary | ICD-10-CM | POA: Diagnosis not present

## 2023-03-21 ENCOUNTER — Encounter: Payer: Self-pay | Admitting: Cardiovascular Disease

## 2023-03-21 ENCOUNTER — Ambulatory Visit: Payer: Medicare HMO | Admitting: Cardiovascular Disease

## 2023-03-21 VITALS — BP 136/72 | HR 72 | Ht 70.25 in | Wt 259.6 lb

## 2023-03-21 DIAGNOSIS — I5031 Acute diastolic (congestive) heart failure: Secondary | ICD-10-CM

## 2023-03-21 DIAGNOSIS — G4733 Obstructive sleep apnea (adult) (pediatric): Secondary | ICD-10-CM | POA: Diagnosis not present

## 2023-03-21 DIAGNOSIS — R0602 Shortness of breath: Secondary | ICD-10-CM

## 2023-03-21 DIAGNOSIS — E782 Mixed hyperlipidemia: Secondary | ICD-10-CM

## 2023-03-21 DIAGNOSIS — I4811 Longstanding persistent atrial fibrillation: Secondary | ICD-10-CM | POA: Diagnosis not present

## 2023-03-21 DIAGNOSIS — I10 Essential (primary) hypertension: Secondary | ICD-10-CM

## 2023-03-21 MED ORDER — DAPAGLIFLOZIN PROPANEDIOL 10 MG PO TABS: 10.0000 mg | ORAL_TABLET | Freq: Every day | ORAL | 3 refills | Status: DC

## 2023-03-21 NOTE — Progress Notes (Signed)
Cardiology Office Note   Date:  03/21/2023   ID:  Brandon Gibson 1951-03-27, MRN [id number]  PCP:  Margaretann Loveless, MD  Cardiologist:  Adrian Blackwater, MD      History of Present Illness: Brandon Gibson is a 72 y.o. male who presents for  Chief Complaint  Patient presents with   Follow-up    Echo and NST Results    Shortness of Breath This is a recurrent problem. The current episode started more than 1 month ago. The problem has been waxing and waning. Pertinent negatives include no chest pain, orthopnea or PND.      Past Medical History:  Diagnosis Date   Arthritis    Atrial fibrillation (HCC)    BPH (benign prostatic hyperplasia)    Cancer (HCC)    HX SKIN CANCER Basal cell   Chickenpox    Clotting disorder (HCC)    Diabetes mellitus without complication (HCC)    type 2   Dysrhythmia    IRREG HEART BEAT   GERD (gastroesophageal reflux disease)    H/O pleurisy    Hypercholesteremia    Hyperlipidemia    Hypertension    Iron deficiency anemia due to chronic blood loss 09/18/2017   Lumbar stenosis    Measles    Mumps    Sleep apnea    sleep study Dr. Park Breed, uses CPAP     Past Surgical History:  Procedure Laterality Date   ANTERIOR CERVICAL DECOMP/DISCECTOMY FUSION  11/25/2011   Procedure: ANTERIOR CERVICAL DECOMPRESSION/DISCECTOMY FUSION 2 LEVELS;  Surgeon: Karn Cassis, MD;  Location: [facility];  Service: Neurosurgery;  Laterality: N/A;  Cervical four-five,Cervical five-six  Anterior cervical decompression/diskectomy, fusion, plate   BREAST BIOPSY Right    Benign   BREAST SURGERY Left 1986   lumpectomy   CARDIAC CATHETERIZATION     2011, [facility]   CARDIOVASCULAR STRESS TEST  2011   CERVICAL FUSION  1988   C 6/7    COLONOSCOPY WITH PROPOFOL N/A 11/08/2017   Procedure: COLONOSCOPY WITH PROPOFOL;  Surgeon: Toledo, Boykin Nearing, MD;  Location: [facility];  Service: Gastroenterology;  Laterality: N/A;   ESOPHAGOGASTRODUODENOSCOPY (EGD)  WITH PROPOFOL N/A 11/08/2017   Procedure: ESOPHAGOGASTRODUODENOSCOPY (EGD) WITH PROPOFOL;  Surgeon: Toledo, Boykin Nearing, MD;  Location: [facility];  Service: Gastroenterology;  Laterality: N/A;   EYE SURGERY     LASIK   JOINT REPLACEMENT     KNEE ARTHROPLASTY Right 10/05/2015   Procedure: COMPUTER ASSISTED TOTAL KNEE ARTHROPLASTY;  Surgeon: Donato Heinz, MD;  Location: [facility];  Service: Orthopedics;  Laterality: Right;   KNEE ARTHROSCOPY  1986   Right   KNEE ARTHROSCOPY Left 08/10/2015   Procedure: LEFT KNEE ARTHROSCOPY, CHONDROPLASTY, MEDIAL MENISECTOMY;  Surgeon: Donato Heinz, MD;  Location: [facility];  Service: Orthopedics;  Laterality: Left;   LAMINECTOMY WITH POSTERIOR LATERAL ARTHRODESIS LEVEL 2 N/A 07/16/2018   Procedure: Posterior lumbar fusion with instrumentation at L3-4 with repeat facetectomy L3-4;  Surgeon: Tia Alert, MD;  Location: [facility] OR;  Service: Neurosurgery;  Laterality: N/A;  Posterior lumbar fusion with instrumentation at L3-4 with repeat facetectomy L3-4   LUMBAR LAMINECTOMY/DECOMPRESSION MICRODISCECTOMY N/A 10/04/2013   Procedure: LUMBAR TWO TO THREE LUMBAR LAMINECTOMY/DECOMPRESSION MICRODISCECTOMY 1 LEVEL;  Surgeon: Karn Cassis, MD;  Location: [facility];  Service: Neurosurgery;  Laterality: N/A;  L2-3 Laminectomy   POSTERIOR LAMINECTOMY / DECOMPRESSION LUMBAR SPINE  2006   TRANSESOPHAGEAL ECHOCARDIOGRAM  2011  Current Outpatient Medications  Medication Sig Dispense Refill   amiodarone (PACERONE) 200 MG tablet TAKE 1 TABLET EVERY DAY (Patient taking differently: Take 200 mg by mouth daily. Takes 1/2 tablet daily) 90 tablet 3   chlorthalidone (HYGROTON) 25 MG tablet TAKE 1 TABLET EVERY DAY 90 tablet 3   dapagliflozin propanediol (FARXIGA) 10 MG TABS tablet Take 1 tablet (10 mg total) by mouth daily before breakfast. 30 tablet 3   docusate (COLACE) 50 MG/5ML liquid Take by mouth daily.     fluticasone (FLONASE) 50 MCG/ACT nasal spray Place 2 sprays  into both nostrils daily.     glucose blood (ACCU-CHEK AVIVA PLUS) test strip TEST BLOOD SUGAR EVERY DAY 100 strip 3   hydrALAZINE (APRESOLINE) 50 MG tablet TAKE 1 TABLET TWICE DAILY 180 tablet 3   Iron-Vitamin C 65-125 MG TABS Take 1 tablet by mouth 2 (two) times daily. 180 tablet 1   loratadine (CLARITIN) 10 MG tablet Take 1 tablet (10 mg total) by mouth daily. 90 tablet 3   metFORMIN (GLUCOPHAGE) 500 MG tablet Take 500 mg by mouth once. 2 tablets in morning 1 tablet at night     metoprolol succinate (TOPROL-XL) 50 MG 24 hr tablet TAKE 1 TABLET EVERY DAY 90 tablet 3   omeprazole (PRILOSEC) 40 MG capsule TAKE 1 CAPSULE EVERY DAY 1/2 HOUR BEFORE A MEAL 90 capsule 3   potassium chloride (KLOR-CON) 10 MEQ tablet TAKE 1 TABLET EVERY DAY 90 tablet 3   rivaroxaban (XARELTO) 20 MG TABS tablet Take 20 mg by mouth at bedtime.     rosuvastatin (CRESTOR) 20 MG tablet TAKE 1 TABLET EVERY DAY 90 tablet 3   tamsulosin (FLOMAX) 0.4 MG CAPS capsule Take 1 capsule (0.4 mg total) by mouth daily. 90 capsule 3   trimethoprim (TRIMPEX) 100 MG tablet Take 1 tablet (100 mg total) by mouth daily. 90 tablet 3   b complex vitamins tablet Take 1 tablet by mouth daily. (Patient not taking: Reported on 02/03/2023)     Omega-3 Fatty Acids (FISH OIL) 1000 MG CAPS Take 1,000 mg by mouth 2 (two) times daily.  (Patient not taking: Reported on 03/21/2023)     No current facility-administered medications for this visit.    Allergies:   Morphine and codeine, Ace inhibitors, Diazepam, and Tape    Social History:   reports that he has never smoked. He has never used smokeless tobacco. He reports current alcohol use of about 14.0 standard drinks of alcohol per week. He reports that he does not use drugs.   Family History:  family history includes Diabetes in his father; Heart disease in his mother; Leukemia in his father.    ROS:     Review of Systems  Constitutional: Negative.   HENT: Negative.    Eyes: Negative.    Respiratory:  Positive for shortness of breath.   Cardiovascular:  Negative for chest pain, orthopnea and PND.  Gastrointestinal: Negative.   Genitourinary: Negative.   Musculoskeletal: Negative.   Skin: Negative.   Neurological: Negative.   Endo/Heme/Allergies: Negative.   Psychiatric/Behavioral: Negative.    All other systems reviewed and are negative.     All other systems are reviewed and negative.    PHYSICAL EXAM: VS:  BP 136/72   Pulse 72   Ht 5' 10.25" (1.784 m)   Wt 259 lb 9.6 oz (117.8 kg)   SpO2 98%   BMI 36.98 kg/m  , BMI Body mass index is 36.98 kg/m. Last weight:  Wt Readings  from Last 3 Encounters:  03/21/23 259 lb 9.6 oz (117.8 kg)  03/16/23 265 lb 6.4 oz (120.4 kg)  03/07/23 253 lb 12.8 oz (115.1 kg)     Physical Exam Vitals reviewed.  Constitutional:      Appearance: Normal appearance. He is normal weight.  HENT:     Head: Normocephalic.     Nose: Nose normal.     Mouth/Throat:     Mouth: Mucous membranes are moist.  Eyes:     Pupils: Pupils are equal, round, and reactive to light.  Cardiovascular:     Rate and Rhythm: Normal rate and regular rhythm.     Pulses: Normal pulses.     Heart sounds: Normal heart sounds.  Pulmonary:     Effort: Pulmonary effort is normal.  Abdominal:     General: Abdomen is flat. Bowel sounds are normal.  Musculoskeletal:        General: Normal range of motion.     Cervical back: Normal range of motion.  Skin:    General: Skin is warm.  Neurological:     General: No focal deficit present.     Mental Status: He is alert.  Psychiatric:        Mood and Affect: Mood normal.       EKG:   Recent Labs: 02/01/2023: ALT 36 02/14/2023: BUN 12; Creatinine, Ser 0.97; Potassium 4.0; Sodium 139 03/07/2023: Hemoglobin 8.8; Platelets 159    Lipid Panel    Component Value Date/Time   CHOL 118 02/01/2023 0915   TRIG 124 02/01/2023 0915   HDL 66 02/01/2023 0915   CHOLHDL 1.8 02/01/2023 0915   LDLCALC 31  02/01/2023 0915      Other studies Reviewed: Additional studies/ records that were reviewed today include:  Review of the above records demonstrates:       No data to display            ASSESSMENT AND PLAN:    ICD-10-CM   1. Essential hypertension, benign  I10 dapagliflozin propanediol (FARXIGA) 10 MG TABS tablet    2. Longstanding persistent atrial fibrillation (HCC)  I48.11 dapagliflozin propanediol (FARXIGA) 10 MG TABS tablet    3. Mixed hyperlipidemia  E78.2 dapagliflozin propanediol (FARXIGA) 10 MG TABS tablet    4. Obstructive sleep apnea  G47.33 dapagliflozin propanediol (FARXIGA) 10 MG TABS tablet    5. SOB (shortness of breath)  R06.02 dapagliflozin propanediol (FARXIGA) 10 MG TABS tablet   Has diastolic dysfunction, and may have HEpEF. Add Farxiga, as normal stress test    6. Acute heart failure with preserved ejection fraction (HCC)  I50.31 dapagliflozin propanediol (FARXIGA) 10 MG TABS tablet   Start farxiga       Problem List Items Addressed This Visit       Cardiovascular and Mediastinum   Essential hypertension, benign - Primary   Relevant Medications   dapagliflozin propanediol (FARXIGA) 10 MG TABS tablet   Atrial fibrillation (HCC)   Relevant Medications   dapagliflozin propanediol (FARXIGA) 10 MG TABS tablet     Respiratory   Obstructive sleep apnea   Relevant Medications   dapagliflozin propanediol (FARXIGA) 10 MG TABS tablet     Other   Hyperlipidemia   Relevant Medications   dapagliflozin propanediol (FARXIGA) 10 MG TABS tablet   Other Visit Diagnoses     SOB (shortness of breath)       Has diastolic dysfunction, and may have HEpEF. Add Farxiga, as normal stress test   Relevant Medications  dapagliflozin propanediol (FARXIGA) 10 MG TABS tablet   Acute heart failure with preserved ejection fraction (HCC)       Start farxiga   Relevant Medications   dapagliflozin propanediol (FARXIGA) 10 MG TABS tablet          Disposition:    Return in about 6 weeks (around 05/02/2023).    Total time spent: 30 minutes  Signed,  Adrian Blackwater, MD  03/21/2023 10:09 AM    [facility]

## 2023-03-22 ENCOUNTER — Inpatient Hospital Stay: Payer: Medicare HMO

## 2023-03-22 VITALS — BP 122/62 | HR 66 | Temp 98.2°F | Resp 18

## 2023-03-22 DIAGNOSIS — E119 Type 2 diabetes mellitus without complications: Secondary | ICD-10-CM | POA: Diagnosis not present

## 2023-03-22 DIAGNOSIS — D500 Iron deficiency anemia secondary to blood loss (chronic): Secondary | ICD-10-CM

## 2023-03-22 DIAGNOSIS — E611 Iron deficiency: Secondary | ICD-10-CM | POA: Diagnosis not present

## 2023-03-22 DIAGNOSIS — R5383 Other fatigue: Secondary | ICD-10-CM | POA: Diagnosis not present

## 2023-03-22 DIAGNOSIS — G4730 Sleep apnea, unspecified: Secondary | ICD-10-CM | POA: Diagnosis not present

## 2023-03-22 DIAGNOSIS — K219 Gastro-esophageal reflux disease without esophagitis: Secondary | ICD-10-CM | POA: Diagnosis not present

## 2023-03-22 DIAGNOSIS — R0602 Shortness of breath: Secondary | ICD-10-CM | POA: Diagnosis not present

## 2023-03-22 DIAGNOSIS — D649 Anemia, unspecified: Secondary | ICD-10-CM | POA: Diagnosis not present

## 2023-03-22 DIAGNOSIS — I10 Essential (primary) hypertension: Secondary | ICD-10-CM | POA: Diagnosis not present

## 2023-03-22 DIAGNOSIS — I4891 Unspecified atrial fibrillation: Secondary | ICD-10-CM | POA: Diagnosis not present

## 2023-03-22 MED ORDER — IRON SUCROSE 20 MG/ML IV SOLN
200.0000 mg | Freq: Once | INTRAVENOUS | Status: AC
  Administered 2023-03-22: 200 mg via INTRAVENOUS
  Filled 2023-03-22: qty 10

## 2023-03-22 MED ORDER — SODIUM CHLORIDE 0.9% FLUSH
10.0000 mL | Freq: Two times a day (BID) | INTRAVENOUS | Status: DC
  Administered 2023-03-22: 10 mL via INTRAVENOUS
  Filled 2023-03-22: qty 10

## 2023-03-22 NOTE — Progress Notes (Signed)
Pt has been educated and understands. Pt declined to stay 30 mins after iron infusion. VSS. Patient stayed a few minutes.

## 2023-03-28 ENCOUNTER — Inpatient Hospital Stay: Payer: Medicare HMO

## 2023-03-28 VITALS — BP 133/57 | HR 61 | Temp 97.0°F | Resp 18

## 2023-03-28 DIAGNOSIS — E119 Type 2 diabetes mellitus without complications: Secondary | ICD-10-CM | POA: Diagnosis not present

## 2023-03-28 DIAGNOSIS — G4730 Sleep apnea, unspecified: Secondary | ICD-10-CM | POA: Diagnosis not present

## 2023-03-28 DIAGNOSIS — K219 Gastro-esophageal reflux disease without esophagitis: Secondary | ICD-10-CM | POA: Diagnosis not present

## 2023-03-28 DIAGNOSIS — D649 Anemia, unspecified: Secondary | ICD-10-CM | POA: Diagnosis not present

## 2023-03-28 DIAGNOSIS — I10 Essential (primary) hypertension: Secondary | ICD-10-CM | POA: Diagnosis not present

## 2023-03-28 DIAGNOSIS — D500 Iron deficiency anemia secondary to blood loss (chronic): Secondary | ICD-10-CM

## 2023-03-28 DIAGNOSIS — I4891 Unspecified atrial fibrillation: Secondary | ICD-10-CM | POA: Diagnosis not present

## 2023-03-28 DIAGNOSIS — E611 Iron deficiency: Secondary | ICD-10-CM | POA: Diagnosis not present

## 2023-03-28 DIAGNOSIS — R5383 Other fatigue: Secondary | ICD-10-CM | POA: Diagnosis not present

## 2023-03-28 DIAGNOSIS — R0602 Shortness of breath: Secondary | ICD-10-CM | POA: Diagnosis not present

## 2023-03-28 MED ORDER — IRON SUCROSE 20 MG/ML IV SOLN
200.0000 mg | Freq: Once | INTRAVENOUS | Status: AC
  Administered 2023-03-28: 200 mg via INTRAVENOUS
  Filled 2023-03-28: qty 10

## 2023-03-28 MED ORDER — SODIUM CHLORIDE 0.9% FLUSH
10.0000 mL | Freq: Once | INTRAVENOUS | Status: AC | PRN
  Administered 2023-03-28: 10 mL
  Filled 2023-03-28: qty 10

## 2023-04-04 ENCOUNTER — Inpatient Hospital Stay: Payer: Medicare HMO | Attending: Internal Medicine

## 2023-04-04 VITALS — BP 165/81 | HR 81 | Temp 97.0°F | Resp 18

## 2023-04-04 DIAGNOSIS — I4891 Unspecified atrial fibrillation: Secondary | ICD-10-CM | POA: Diagnosis not present

## 2023-04-04 DIAGNOSIS — D649 Anemia, unspecified: Secondary | ICD-10-CM | POA: Insufficient documentation

## 2023-04-04 DIAGNOSIS — D500 Iron deficiency anemia secondary to blood loss (chronic): Secondary | ICD-10-CM

## 2023-04-04 DIAGNOSIS — R5383 Other fatigue: Secondary | ICD-10-CM | POA: Diagnosis not present

## 2023-04-04 DIAGNOSIS — Z79899 Other long term (current) drug therapy: Secondary | ICD-10-CM | POA: Diagnosis not present

## 2023-04-04 MED ORDER — IRON SUCROSE 20 MG/ML IV SOLN
200.0000 mg | Freq: Once | INTRAVENOUS | Status: AC
  Administered 2023-04-04: 200 mg via INTRAVENOUS

## 2023-04-04 MED ORDER — SODIUM CHLORIDE 0.9% FLUSH
10.0000 mL | Freq: Two times a day (BID) | INTRAVENOUS | Status: DC
  Filled 2023-04-04: qty 10

## 2023-04-04 NOTE — Patient Instructions (Signed)
Iron Sucrose Injection What is this medication? IRON SUCROSE (EYE ern SOO krose) treats low levels of iron (iron deficiency anemia) in people with kidney disease. Iron is a mineral that plays an important role in making red blood cells, which carry oxygen from your lungs to the rest of your body. This medicine may be used for other purposes; ask your health care provider or pharmacist if you have questions. COMMON BRAND NAME(S): Venofer What should I tell my care team before I take this medication? They need to know if you have any of these conditions: Anemia not caused by low iron levels Heart disease High levels of iron in the blood Kidney disease Liver disease An unusual or allergic reaction to iron, other medications, foods, dyes, or preservatives Pregnant or trying to get pregnant Breastfeeding How should I use this medication? This medication is for infusion into a vein. It is given in a hospital or clinic setting. Talk to your care team about the use of this medication in children. While this medication may be prescribed for children as young as 2 years for selected conditions, precautions do apply. Overdosage: If you think you have taken too much of this medicine contact a poison control center or emergency room at once. NOTE: This medicine is only for you. Do not share this medicine with others. What if I miss a dose? Keep appointments for follow-up doses. It is important not to miss your dose. Call your care team if you are unable to keep an appointment. What may interact with this medication? Do not take this medication with any of the following: Deferoxamine Dimercaprol Other iron products This medication may also interact with the following: Chloramphenicol Deferasirox This list may not describe all possible interactions. Give your health care provider a list of all the medicines, herbs, non-prescription drugs, or dietary supplements you use. Also tell them if you smoke,  drink alcohol, or use illegal drugs. Some items may interact with your medicine. What should I watch for while using this medication? Visit your care team regularly. Tell your care team if your symptoms do not start to get better or if they get worse. You may need blood work done while you are taking this medication. You may need to follow a special diet. Talk to your care team. Foods that contain iron include: whole grains/cereals, dried fruits, beans, or peas, leafy green vegetables, and organ meats (liver, kidney). What side effects may I notice from receiving this medication? Side effects that you should report to your care team as soon as possible: Allergic reactions--skin rash, itching, hives, swelling of the face, lips, tongue, or throat Low blood pressure--dizziness, feeling faint or lightheaded, blurry vision Shortness of breath Side effects that usually do not require medical attention (report to your care team if they continue or are bothersome): Flushing Headache Joint pain Muscle pain Nausea Pain, redness, or irritation at injection site This list may not describe all possible side effects. Call your doctor for medical advice about side effects. You may report side effects to FDA at 1-800-FDA-1088. Where should I keep my medication? This medication is given in a hospital or clinic. It will not be stored at home. NOTE: This sheet is a summary. It may not cover all possible information. If you have questions about this medicine, talk to your doctor, pharmacist, or health care provider.  2024 Elsevier/Gold Standard (2022-09-23 00:00:00)

## 2023-04-24 ENCOUNTER — Telehealth: Payer: Self-pay

## 2023-04-30 ENCOUNTER — Other Ambulatory Visit: Payer: Self-pay | Admitting: Internal Medicine

## 2023-04-30 DIAGNOSIS — E119 Type 2 diabetes mellitus without complications: Secondary | ICD-10-CM

## 2023-05-04 ENCOUNTER — Ambulatory Visit (INDEPENDENT_AMBULATORY_CARE_PROVIDER_SITE_OTHER): Payer: Medicare HMO | Admitting: Cardiovascular Disease

## 2023-05-04 ENCOUNTER — Other Ambulatory Visit: Payer: Self-pay

## 2023-05-04 ENCOUNTER — Encounter: Payer: Self-pay | Admitting: Cardiovascular Disease

## 2023-05-04 ENCOUNTER — Telehealth: Payer: Self-pay

## 2023-05-04 VITALS — BP 129/82 | HR 70 | Ht 70.0 in | Wt 258.4 lb

## 2023-05-04 DIAGNOSIS — G4733 Obstructive sleep apnea (adult) (pediatric): Secondary | ICD-10-CM

## 2023-05-04 DIAGNOSIS — K219 Gastro-esophageal reflux disease without esophagitis: Secondary | ICD-10-CM

## 2023-05-04 DIAGNOSIS — I480 Paroxysmal atrial fibrillation: Secondary | ICD-10-CM

## 2023-05-04 DIAGNOSIS — I10 Essential (primary) hypertension: Secondary | ICD-10-CM | POA: Diagnosis not present

## 2023-05-04 MED ORDER — APIXABAN 5 MG PO TABS: 5.0000 mg | ORAL_TABLET | Freq: Two times a day (BID) | ORAL | 1 refills | Status: DC

## 2023-05-04 MED ORDER — APIXABAN 5 MG PO TABS: 5.0000 mg | ORAL_TABLET | Freq: Two times a day (BID) | ORAL | 3 refills | Status: DC

## 2023-05-04 NOTE — Telephone Encounter (Signed)
 Patient is out of his xarelto and we are no longer getting samples, can we switch him to eliquis and if we can what dose does he need?

## 2023-05-04 NOTE — Progress Notes (Signed)
 Cardiology Office Note   Date:  05/04/2023   ID:  Garland Hincapie, DOB 03-20-1951, MRN [id number]  PCP:  Fernand Fredy RAMAN, MD  Cardiologist:  Denyse Fernand, MD      History of Present Illness: Lorain Keast is a 73 y.o. male who presents for  Chief Complaint  Patient presents with   Follow-up    Feeling better, heart rate 70      Past Medical History:  Diagnosis Date   Arthritis    Atrial fibrillation (HCC)    BPH (benign prostatic hyperplasia)    Cancer (HCC)    HX SKIN CANCER Basal cell   Chickenpox    Clotting disorder (HCC)    Diabetes mellitus without complication (HCC)    type 2   Dysrhythmia    IRREG HEART BEAT   GERD (gastroesophageal reflux disease)    H/O pleurisy    Hypercholesteremia    Hyperlipidemia    Hypertension    Iron  deficiency anemia due to chronic blood loss 09/18/2017   Lumbar stenosis    Measles    Mumps    Sleep apnea    sleep study Dr. Deretha, uses CPAP     Past Surgical History:  Procedure Laterality Date   ANTERIOR CERVICAL DECOMP/DISCECTOMY FUSION  11/25/2011   Procedure: ANTERIOR CERVICAL DECOMPRESSION/DISCECTOMY FUSION 2 LEVELS;  Surgeon: Catalina CHRISTELLA Stains, MD;  Location: [facility];  Service: Neurosurgery;  Laterality: N/A;  Cervical four-five,Cervical five-six  Anterior cervical decompression/diskectomy, fusion, plate   BREAST BIOPSY Right    Benign   BREAST SURGERY Left 1986   lumpectomy   CARDIAC CATHETERIZATION     2011, [facility]   CARDIOVASCULAR STRESS TEST  2011   CERVICAL FUSION  1988   C 6/7    COLONOSCOPY WITH PROPOFOL  N/A 11/08/2017   Procedure: COLONOSCOPY WITH PROPOFOL ;  Surgeon: Toledo, Ladell POUR, MD;  Location: [facility];  Service: Gastroenterology;  Laterality: N/A;   ESOPHAGOGASTRODUODENOSCOPY (EGD) WITH PROPOFOL  N/A 11/08/2017   Procedure: ESOPHAGOGASTRODUODENOSCOPY (EGD) WITH PROPOFOL ;  Surgeon: Toledo, Ladell POUR, MD;  Location: [facility];  Service: Gastroenterology;  Laterality: N/A;    EYE SURGERY     LASIK   JOINT REPLACEMENT     KNEE ARTHROPLASTY Right 10/05/2015   Procedure: COMPUTER ASSISTED TOTAL KNEE ARTHROPLASTY;  Surgeon: Lynwood SHAUNNA Hue, MD;  Location: [facility];  Service: Orthopedics;  Laterality: Right;   KNEE ARTHROSCOPY  1986   Right   KNEE ARTHROSCOPY Left 08/10/2015   Procedure: LEFT KNEE ARTHROSCOPY, CHONDROPLASTY, MEDIAL MENISECTOMY;  Surgeon: Lynwood SHAUNNA Hue, MD;  Location: [facility];  Service: Orthopedics;  Laterality: Left;   LAMINECTOMY WITH POSTERIOR LATERAL ARTHRODESIS LEVEL 2 N/A 07/16/2018   Procedure: Posterior lumbar fusion with instrumentation at L3-4 with repeat facetectomy L3-4;  Surgeon: Joshua Alm RAMAN, MD;  Location: [facility] OR;  Service: Neurosurgery;  Laterality: N/A;  Posterior lumbar fusion with instrumentation at L3-4 with repeat facetectomy L3-4   LUMBAR LAMINECTOMY/DECOMPRESSION MICRODISCECTOMY N/A 10/04/2013   Procedure: LUMBAR TWO TO THREE LUMBAR LAMINECTOMY/DECOMPRESSION MICRODISCECTOMY 1 LEVEL;  Surgeon: Catalina CHRISTELLA Stains, MD;  Location: [facility];  Service: Neurosurgery;  Laterality: N/A;  L2-3 Laminectomy   POSTERIOR LAMINECTOMY / DECOMPRESSION LUMBAR SPINE  2006   TRANSESOPHAGEAL ECHOCARDIOGRAM  2011     Current Outpatient Medications  Medication Sig Dispense Refill   ACCU-CHEK AVIVA PLUS test strip TEST BLOOD SUGAR EVERY DAY 100 strip 3   amiodarone  (PACERONE ) 200 MG tablet TAKE 1 TABLET EVERY DAY (  Patient taking differently: Take 200 mg by mouth daily. Takes 1/2 tablet daily) 90 tablet 3   b complex vitamins tablet Take 1 tablet by mouth daily. (Patient not taking: Reported on 02/03/2023)     chlorthalidone (HYGROTON) 25 MG tablet TAKE 1 TABLET EVERY DAY 90 tablet 3   docusate (COLACE) 50 MG/5ML liquid Take by mouth daily.     fluticasone (FLONASE) 50 MCG/ACT nasal spray Place 2 sprays into both nostrils daily.     hydrALAZINE  (APRESOLINE ) 50 MG tablet TAKE 1 TABLET TWICE DAILY 180 tablet 3   Iron -Vitamin C  65-125 MG TABS Take 1  tablet by mouth 2 (two) times daily. 180 tablet 1   loratadine  (CLARITIN ) 10 MG tablet Take 1 tablet (10 mg total) by mouth daily. 90 tablet 3   metFORMIN  (GLUCOPHAGE ) 500 MG tablet Take 500 mg by mouth once. 2 tablets in morning 1 tablet at night     metoprolol  succinate (TOPROL -XL) 50 MG 24 hr tablet TAKE 1 TABLET EVERY DAY 90 tablet 3   Omega-3 Fatty Acids (FISH OIL) 1000 MG CAPS Take 1,000 mg by mouth 2 (two) times daily.  (Patient not taking: Reported on 03/21/2023)     omeprazole (PRILOSEC) 40 MG capsule TAKE 1 CAPSULE EVERY DAY 1/2 HOUR BEFORE A MEAL 90 capsule 3   potassium chloride  (KLOR-CON ) 10 MEQ tablet TAKE 1 TABLET EVERY DAY 90 tablet 3   rivaroxaban  (XARELTO ) 20 MG TABS tablet Take 20 mg by mouth at bedtime.     rosuvastatin  (CRESTOR ) 20 MG tablet TAKE 1 TABLET EVERY DAY 90 tablet 3   tamsulosin  (FLOMAX ) 0.4 MG CAPS capsule Take 1 capsule (0.4 mg total) by mouth daily. 90 capsule 3   trimethoprim  (TRIMPEX ) 100 MG tablet Take 1 tablet (100 mg total) by mouth daily. 90 tablet 3   No current facility-administered medications for this visit.    Allergies:   Morphine  and codeine, Ace inhibitors, Diazepam , and Tape    Social History:   reports that he has never smoked. He has never used smokeless tobacco. He reports current alcohol  use of about 14.0 standard drinks of alcohol  per week. He reports that he does not use drugs.   Family History:  family history includes Diabetes in his father; Heart disease in his mother; Leukemia in his father.    ROS:     Review of Systems  Constitutional: Negative.   HENT: Negative.    Eyes: Negative.   Respiratory: Negative.    Gastrointestinal: Negative.   Genitourinary: Negative.   Musculoskeletal: Negative.   Skin: Negative.   Neurological: Negative.   Endo/Heme/Allergies: Negative.   Psychiatric/Behavioral: Negative.    All other systems reviewed and are negative.     All other systems are reviewed and negative.    PHYSICAL  EXAM: VS:  BP 129/82   Pulse 70   Ht 5' 10 (1.778 m)   Wt 258 lb 6.4 oz (117.2 kg)   SpO2 98%   BMI 37.08 kg/m  , BMI Body mass index is 37.08 kg/m. Last weight:  Wt Readings from Last 3 Encounters:  05/04/23 258 lb 6.4 oz (117.2 kg)  03/21/23 259 lb 9.6 oz (117.8 kg)  03/16/23 265 lb 6.4 oz (120.4 kg)     Physical Exam Vitals reviewed.  Constitutional:      Appearance: Normal appearance. He is normal weight.  HENT:     Head: Normocephalic.     Nose: Nose normal.     Mouth/Throat:  Mouth: Mucous membranes are moist.  Eyes:     Pupils: Pupils are equal, round, and reactive to light.  Cardiovascular:     Rate and Rhythm: Normal rate and regular rhythm.     Pulses: Normal pulses.     Heart sounds: Normal heart sounds.  Pulmonary:     Effort: Pulmonary effort is normal.  Abdominal:     General: Abdomen is flat. Bowel sounds are normal.  Musculoskeletal:        General: Normal range of motion.     Cervical back: Normal range of motion.  Skin:    General: Skin is warm.  Neurological:     General: No focal deficit present.     Mental Status: He is alert.  Psychiatric:        Mood and Affect: Mood normal.       EKG:   Recent Labs: 02/01/2023: ALT 36 02/14/2023: BUN 12; Creatinine, Ser 0.97; Potassium 4.0; Sodium 139 03/07/2023: Hemoglobin 8.8; Platelets 159    Lipid Panel    Component Value Date/Time   CHOL 118 02/01/2023 0915   TRIG 124 02/01/2023 0915   HDL 66 02/01/2023 0915   CHOLHDL 1.8 02/01/2023 0915   LDLCALC 31 02/01/2023 0915      Other studies Reviewed: Additional studies/ records that were reviewed today include:  Review of the above records demonstrates:       No data to display            ASSESSMENT AND PLAN:    ICD-10-CM   1. Essential hypertension, benign  I10     2. Obstructive sleep apnea  G47.33     3. Gastroesophageal reflux disease without esophagitis  K21.9     4. Paroxysmal A-fib (HCC)  I48.0    In [facility]R, on  amio 100 mg daily and metoprolol  50 daily. unable to afford Farxiga        Problem List Items Addressed This Visit       Cardiovascular and Mediastinum   Essential hypertension, benign - Primary     Respiratory   Obstructive sleep apnea     Digestive   GERD (gastroesophageal reflux disease)   Other Visit Diagnoses       Paroxysmal A-fib (HCC)       In NSR, on amio 100 mg daily and metoprolol  50 daily. unable to afford Farxiga           Disposition:   Return in about 4 months (around 09/01/2023).    Total time spent: 30 minutes  Signed,  Denyse Bathe, MD  05/04/2023 10:09 AM    [facility]

## 2023-05-04 NOTE — Addendum Note (Signed)
 Addended by: Adrian Blackwater A on: 05/04/2023 11:28 AM   Modules accepted: Orders

## 2023-05-04 NOTE — Telephone Encounter (Signed)
 Taken care of

## 2023-05-04 NOTE — Telephone Encounter (Signed)
 Duplicate

## 2023-05-08 ENCOUNTER — Encounter: Payer: Self-pay | Admitting: Internal Medicine

## 2023-05-17 ENCOUNTER — Encounter: Payer: Self-pay | Admitting: Internal Medicine

## 2023-05-17 ENCOUNTER — Inpatient Hospital Stay: Payer: Medicare HMO | Admitting: Internal Medicine

## 2023-05-17 ENCOUNTER — Inpatient Hospital Stay: Payer: Medicare HMO | Attending: Internal Medicine

## 2023-05-17 ENCOUNTER — Inpatient Hospital Stay: Payer: Medicare HMO

## 2023-05-17 VITALS — BP 135/69 | HR 56 | Resp 16

## 2023-05-17 VITALS — BP 139/76 | HR 67 | Temp 97.1°F | Ht 70.0 in | Wt 256.0 lb

## 2023-05-17 DIAGNOSIS — D649 Anemia, unspecified: Secondary | ICD-10-CM | POA: Diagnosis not present

## 2023-05-17 DIAGNOSIS — K219 Gastro-esophageal reflux disease without esophagitis: Secondary | ICD-10-CM | POA: Diagnosis not present

## 2023-05-17 DIAGNOSIS — E611 Iron deficiency: Secondary | ICD-10-CM | POA: Insufficient documentation

## 2023-05-17 DIAGNOSIS — Z806 Family history of leukemia: Secondary | ICD-10-CM | POA: Diagnosis not present

## 2023-05-17 DIAGNOSIS — Z833 Family history of diabetes mellitus: Secondary | ICD-10-CM | POA: Diagnosis not present

## 2023-05-17 DIAGNOSIS — R0602 Shortness of breath: Secondary | ICD-10-CM | POA: Insufficient documentation

## 2023-05-17 DIAGNOSIS — E119 Type 2 diabetes mellitus without complications: Secondary | ICD-10-CM | POA: Insufficient documentation

## 2023-05-17 DIAGNOSIS — I10 Essential (primary) hypertension: Secondary | ICD-10-CM | POA: Diagnosis not present

## 2023-05-17 DIAGNOSIS — Z7901 Long term (current) use of anticoagulants: Secondary | ICD-10-CM | POA: Diagnosis not present

## 2023-05-17 DIAGNOSIS — I4891 Unspecified atrial fibrillation: Secondary | ICD-10-CM | POA: Insufficient documentation

## 2023-05-17 DIAGNOSIS — R5383 Other fatigue: Secondary | ICD-10-CM | POA: Diagnosis not present

## 2023-05-17 DIAGNOSIS — Z85828 Personal history of other malignant neoplasm of skin: Secondary | ICD-10-CM | POA: Diagnosis not present

## 2023-05-17 DIAGNOSIS — Z885 Allergy status to narcotic agent status: Secondary | ICD-10-CM | POA: Diagnosis not present

## 2023-05-17 DIAGNOSIS — Z79899 Other long term (current) drug therapy: Secondary | ICD-10-CM | POA: Diagnosis not present

## 2023-05-17 DIAGNOSIS — Z8249 Family history of ischemic heart disease and other diseases of the circulatory system: Secondary | ICD-10-CM | POA: Insufficient documentation

## 2023-05-17 DIAGNOSIS — K649 Unspecified hemorrhoids: Secondary | ICD-10-CM | POA: Diagnosis not present

## 2023-05-17 DIAGNOSIS — D500 Iron deficiency anemia secondary to blood loss (chronic): Secondary | ICD-10-CM

## 2023-05-17 LAB — CBC WITH DIFFERENTIAL (CANCER CENTER ONLY)
Abs Immature Granulocytes: 0.02 10*3/uL (ref 0.00–0.07)
Basophils Absolute: 0 10*3/uL (ref 0.0–0.1)
Basophils Relative: 1 %
Eosinophils Absolute: 0.1 10*3/uL (ref 0.0–0.5)
Eosinophils Relative: 3 %
HCT: 36.5 % — ABNORMAL LOW (ref 39.0–52.0)
Hemoglobin: 10.7 g/dL — ABNORMAL LOW (ref 13.0–17.0)
Immature Granulocytes: 1 %
Lymphocytes Relative: 23 %
Lymphs Abs: 0.9 10*3/uL (ref 0.7–4.0)
MCH: 22.1 pg — ABNORMAL LOW (ref 26.0–34.0)
MCHC: 29.3 g/dL — ABNORMAL LOW (ref 30.0–36.0)
MCV: 75.4 fL — ABNORMAL LOW (ref 80.0–100.0)
Monocytes Absolute: 0.5 10*3/uL (ref 0.1–1.0)
Monocytes Relative: 12 %
Neutro Abs: 2.3 10*3/uL (ref 1.7–7.7)
Neutrophils Relative %: 60 %
Platelet Count: 159 10*3/uL (ref 150–400)
RBC: 4.84 MIL/uL (ref 4.22–5.81)
RDW: 18.9 % — ABNORMAL HIGH (ref 11.5–15.5)
WBC Count: 3.9 10*3/uL — ABNORMAL LOW (ref 4.0–10.5)
nRBC: 0 % (ref 0.0–0.2)

## 2023-05-17 LAB — BASIC METABOLIC PANEL
Anion gap: 13 (ref 5–15)
BUN: 12 mg/dL (ref 8–23)
CO2: 24 mmol/L (ref 22–32)
Calcium: 9.1 mg/dL (ref 8.9–10.3)
Chloride: 100 mmol/L (ref 98–111)
Creatinine, Ser: 1.14 mg/dL (ref 0.61–1.24)
GFR, Estimated: >60 mL/min (ref >60)
Glucose, Bld: 170 mg/dL — ABNORMAL HIGH (ref 70–99)
Potassium: 3.5 mmol/L (ref 3.5–5.1)
Sodium: 137 mmol/L (ref 135–145)

## 2023-05-17 LAB — IRON AND TIBC
Iron: 33 ug/dL — ABNORMAL LOW (ref 45–182)
Saturation Ratios: 6 % — ABNORMAL LOW (ref 17.9–39.5)
TIBC: 594 ug/dL — ABNORMAL HIGH (ref 250–450)
UIBC: 561 ug/dL

## 2023-05-17 LAB — FERRITIN: Ferritin: 14 ng/mL — ABNORMAL LOW (ref 24–336)

## 2023-05-17 LAB — LACTATE DEHYDROGENASE: LDH: 127 U/L (ref 98–192)

## 2023-05-17 MED ORDER — IRON SUCROSE 20 MG/ML IV SOLN
200.0000 mg | Freq: Once | INTRAVENOUS | Status: AC
  Administered 2023-05-17: 200 mg via INTRAVENOUS
  Filled 2023-05-17: qty 10

## 2023-05-17 NOTE — Assessment & Plan Note (Addendum)
#   Iron  deficiency anemia- Hb 8 [PCP-NOV 2024-ferritin-12; I sat-10]-symptomatic. Etiology GI blood loss-?  Hemorrhoidal -/patient on Xarelto .  Lack of improvement on oral iron .   # s/p venofer  x4- mild improvement- Hb 10.4- proceed with venofer  twice weekly x 2- total 4 more infusion  # Etiology of iron  deficiency: Unclear;-  s/p previous d GI evaluation-EGD colonoscopy; Bobbi Burow study [2019]- [facility]- ?  Defer to PCP regarding referral to surgery for hemorrhoidal bleeding.   # A.fib on xarelo [Dr.Khan]-monitor closely for anemia..  # DISPOSITION: # venofer   today # venofer  twice week- next week; and 1 more the following week # follow up 3  month- MD; labs- cbc/bmp;LDH; iron  studies; ferritin- possible venofer - Dr.B

## 2023-05-17 NOTE — Progress Notes (Signed)
 [facility] CONSULT NOTE  Patient Care Team: Aisha Hove, MD as PCP - General (Internal Medicine) Gwyn Leos, MD as Consulting Physician (Internal Medicine)  CHIEF COMPLAINTS/PURPOSE OF CONSULTATION: ANEMIA   HEMATOLOGY HISTORY  # ANEMIA[Hb; MCV-platelets- WBC; Iron  sat; ferritin;  GFR- CT/US - ;   HISTORY OF PRESENTING ILLNESS: Patient ambulating-independently. Alone.  Brandon Gibson Biljon 73 y.o.  male pleasant patient is history of diabetes and also A-fib on Xarelto -iron  deficient anemia is here for follow-up.  Patient s/p iron  infusions.  X 4.  Shortness of breath is improved.  Not resolved.  Ongoing fatigue.  Complains of dizziness especially on standing.  No falls.  Patient continues to have chronic bleeding hemorrhoids.    Review of Systems  Constitutional:  Positive for malaise/fatigue. Negative for chills, diaphoresis, fever and weight loss.  HENT:  Negative for nosebleeds and sore throat.   Eyes:  Negative for double vision.  Respiratory:  Negative for cough, hemoptysis, sputum production, shortness of breath and wheezing.   Cardiovascular:  Negative for chest pain, palpitations, orthopnea and leg swelling.  Gastrointestinal:  Negative for abdominal pain, blood in stool, constipation, diarrhea, heartburn, melena, nausea and vomiting.  Genitourinary:  Negative for dysuria, frequency and urgency.  Musculoskeletal:  Negative for back pain and joint pain.  Skin: Negative.  Negative for itching and rash.  Neurological:  Negative for dizziness, tingling, focal weakness, weakness and headaches.  Endo/Heme/Allergies:  Does not bruise/bleed easily.  Psychiatric/Behavioral:  Negative for depression. The patient is not nervous/anxious and does not have insomnia.      MEDICAL HISTORY:  Past Medical History:  Diagnosis Date   Arthritis    Atrial fibrillation (HCC)    BPH (benign prostatic hyperplasia)    Cancer (HCC)    HX SKIN CANCER Basal cell    Chickenpox    Clotting disorder (HCC)    Diabetes mellitus without complication (HCC)    type 2   Dysrhythmia    IRREG HEART BEAT   GERD (gastroesophageal reflux disease)    H/O pleurisy    Hypercholesteremia    Hyperlipidemia    Hypertension    Iron  deficiency anemia due to chronic blood loss 09/18/2017   Lumbar stenosis    Measles    Mumps    Sleep apnea    sleep study Dr. Phyllis Breeze, uses CPAP    SURGICAL HISTORY: Past Surgical History:  Procedure Laterality Date   ANTERIOR CERVICAL DECOMP/DISCECTOMY FUSION  11/25/2011   Procedure: ANTERIOR CERVICAL DECOMPRESSION/DISCECTOMY FUSION 2 LEVELS;  Surgeon: Adelbert Adler, MD;  Location: [facility];  Service: Neurosurgery;  Laterality: N/A;  Cervical four-five,Cervical five-six  Anterior cervical decompression/diskectomy, fusion, plate   BREAST BIOPSY Right    Benign   BREAST SURGERY Left 1986   lumpectomy   CARDIAC CATHETERIZATION     2011, [facility]   CARDIOVASCULAR STRESS TEST  2011   CERVICAL FUSION  1988   C 6/7    COLONOSCOPY WITH PROPOFOL  N/A 11/08/2017   Procedure: COLONOSCOPY WITH PROPOFOL ;  Surgeon: Toledo, Alphonsus Jeans, MD;  Location: [facility];  Service: Gastroenterology;  Laterality: N/A;   ESOPHAGOGASTRODUODENOSCOPY (EGD) WITH PROPOFOL  N/A 11/08/2017   Procedure: ESOPHAGOGASTRODUODENOSCOPY (EGD) WITH PROPOFOL ;  Surgeon: Toledo, Alphonsus Jeans, MD;  Location: [facility];  Service: Gastroenterology;  Laterality: N/A;   EYE SURGERY     LASIK   JOINT REPLACEMENT     KNEE ARTHROPLASTY Right 10/05/2015   Procedure: COMPUTER ASSISTED TOTAL KNEE ARTHROPLASTY;  Surgeon: Arlyne Lame,  MD;  Location: [facility];  Service: Orthopedics;  Laterality: Right;   KNEE ARTHROSCOPY  1986   Right   KNEE ARTHROSCOPY Left 08/10/2015   Procedure: LEFT KNEE ARTHROSCOPY, CHONDROPLASTY, MEDIAL MENISECTOMY;  Surgeon: Arlyne Lame, MD;  Location: [facility];  Service: Orthopedics;  Laterality: Left;   LAMINECTOMY WITH POSTERIOR LATERAL  ARTHRODESIS LEVEL 2 N/A 07/16/2018   Procedure: Posterior lumbar fusion with instrumentation at L3-4 with repeat facetectomy L3-4;  Surgeon: Isadora Mar, MD;  Location[facility];  Service: Neurosurgery;  Laterality: N/A;  Posterior lumbar fusion with instrumentation at L3-4 with repeat facetectomy L3-4   LUMBAR LAMINECTOMY/DECOMPRESSION MICRODISCECTOMY N/A 10/04/2013   Procedure: LUMBAR TWO TO THREE LUMBAR LAMINECTOMY/DECOMPRESSION MICRODISCECTOMY 1 LEVEL;  Surgeon: Adelbert Adler, MD;  Location: [facility];  Service: Neurosurgery;  Laterality: N/A;  L2-3 Laminectomy   POSTERIOR LAMINECTOMY / DECOMPRESSION LUMBAR SPINE  2006   TRANSESOPHAGEAL ECHOCARDIOGRAM  2011    SOCIAL HISTORY: Social History   Socioeconomic History   Marital status: Widowed    Spouse name: Not on file   Number of children: Not on file   Years of education: Not on file   Highest education level: Not on file  Occupational History   Not on file  Tobacco Use   Smoking status: Never   Smokeless tobacco: Never  Vaping Use   Vaping status: Never Used  Substance and Sexual Activity   Alcohol  use: Yes    Alcohol /week: 14.0 standard drinks of alcohol     Types: 14 Glasses of wine per week   Drug use: No   Sexual activity: Not on file  Other Topics Concern   Not on file  Social History Narrative   Not on file   Social Drivers of Health   Financial Resource Strain: Not on file  Food Insecurity: No Food Insecurity (03/16/2023)   Hunger Vital Sign    Worried About Running Out of Food in the Last Year: Never true    Ran Out of Food in the Last Year: Never true  Transportation Needs: No Transportation Needs (03/16/2023)   PRAPARE - Administrator, Civil Service (Medical): No    Lack of Transportation (Non-Medical): No  Physical Activity: Not on file  Stress: Not on file  Social Connections: Not on file  Intimate Partner Violence: Not At Risk (03/16/2023)   Humiliation, Afraid, Rape, and Kick  questionnaire    Fear of Current or Ex-Partner: No    Emotionally Abused: No    Physically Abused: No    Sexually Abused: No    FAMILY HISTORY: Family History  Problem Relation Age of Onset   Leukemia Father    Diabetes Father    Heart disease Mother    Prostate cancer Neg Hx    Chronic Renal Failure Neg Hx    Breast cancer Neg Hx     ALLERGIES:  is allergic to morphine  and codeine, ace inhibitors, diazepam , and tape.  MEDICATIONS:  Current Outpatient Medications  Medication Sig Dispense Refill   ACCU-CHEK AVIVA PLUS test strip TEST BLOOD SUGAR EVERY DAY 100 strip 3   amiodarone  (PACERONE ) 200 MG tablet TAKE 1 TABLET EVERY DAY (Patient taking differently: Take 200 mg by mouth daily. Takes 1/2 tablet daily) 90 tablet 3   apixaban  (ELIQUIS ) 5 MG TABS tablet Take 1 tablet (5 mg total) by mouth 2 (two) times daily. 180 tablet 1   chlorthalidone (HYGROTON) 25 MG tablet TAKE 1 TABLET EVERY DAY 90 tablet 3  docusate (COLACE) 50 MG/5ML liquid Take by mouth daily.     fluticasone (FLONASE) 50 MCG/ACT nasal spray Place 2 sprays into both nostrils daily.     hydrALAZINE  (APRESOLINE ) 50 MG tablet TAKE 1 TABLET TWICE DAILY 180 tablet 3   Iron -Vitamin C  65-125 MG TABS Take 1 tablet by mouth 2 (two) times daily. 180 tablet 1   loratadine  (CLARITIN ) 10 MG tablet Take 1 tablet (10 mg total) by mouth daily. 90 tablet 3   metFORMIN  (GLUCOPHAGE ) 500 MG tablet Take 500 mg by mouth once. 2 tablets in morning 1 tablet at night     metoprolol  succinate (TOPROL -XL) 50 MG 24 hr tablet TAKE 1 TABLET EVERY DAY 90 tablet 3   omeprazole (PRILOSEC) 40 MG capsule TAKE 1 CAPSULE EVERY DAY 1/2 HOUR BEFORE A MEAL 90 capsule 3   potassium chloride  (KLOR-CON ) 10 MEQ tablet TAKE 1 TABLET EVERY DAY 90 tablet 3   rosuvastatin  (CRESTOR ) 20 MG tablet TAKE 1 TABLET EVERY DAY 90 tablet 3   tamsulosin  (FLOMAX ) 0.4 MG CAPS capsule Take 1 capsule (0.4 mg total) by mouth daily. 90 capsule 3   trimethoprim  (TRIMPEX ) 100 MG  tablet Take 1 tablet (100 mg total) by mouth daily. 90 tablet 3   No current facility-administered medications for this visit.   Facility-Administered Medications Ordered in Other Visits  Medication Dose Route Frequency Provider Last Rate Last Admin   iron  sucrose (VENOFER ) injection 200 mg  200 mg Intravenous Once Lenya Sterne R, MD         PHYSICAL EXAMINATION:   Vitals:   05/17/23 0947  BP: 139/76  Pulse: 67  Temp: (!) 97.1 F (36.2 C)  SpO2: 98%   Filed Weights   05/17/23 0947  Weight: 256 lb (116.1 kg)    Physical Exam Vitals and nursing note reviewed.  HENT:     Head: Normocephalic and atraumatic.     Mouth/Throat:     Pharynx: Oropharynx is clear.  Eyes:     Extraocular Movements: Extraocular movements intact.     Pupils: Pupils are equal, round, and reactive to light.  Cardiovascular:     Rate and Rhythm: Normal rate and regular rhythm.  Pulmonary:     Comments: Decreased breath sounds bilaterally.  Abdominal:     Palpations: Abdomen is soft.  Musculoskeletal:        General: Normal range of motion.     Cervical back: Normal range of motion.  Skin:    General: Skin is warm.  Neurological:     General: No focal deficit present.     Mental Status: He is alert and oriented to person, place, and time.  Psychiatric:        Behavior: Behavior normal.        Judgment: Judgment normal.      LABORATORY DATA:  I have reviewed the data as listed Lab Results  Component Value Date   WBC 3.9 (L) 05/17/2023   HGB 10.7 (L) 05/17/2023   HCT 36.5 (L) 05/17/2023   MCV 75.4 (L) 05/17/2023   PLT 159 05/17/2023   Recent Labs    06/23/22 1041 10/25/22 1137 02/01/23 0915 02/14/23 0957 05/17/23 0938  NA 137 138 142 139 137  K 3.8 3.5 3.9 4.0 3.5  CL 99 100 103 103 100  CO2 22 25 22 22 24   GLUCOSE 142* 115* 146* 145* 170*  BUN 12 9 11 12 12   CREATININE 0.98 1.01 1.02 0.97 1.14  CALCIUM  9.3 8.9 8.6 9.2  9.1  GFRNONAA  --   --   --   --  >60  PROT  6.0 5.9* 5.5*  --   --   ALBUMIN 4.2 4.1 4.1  --   --   AST 38 38 39  --   --   ALT 47* 35 36  --   --   ALKPHOS 82 64 58  --   --   BILITOT 0.5 0.4 0.3  --   --      No results found.  ASSESSMENT & PLAN:   Symptomatic anemia # Iron  deficiency anemia- Hb 8 [PCP-NOV 2024-ferritin-12; I sat-10]-symptomatic. Etiology GI blood loss-?  Hemorrhoidal -/patient on Xarelto .  Lack of improvement on oral iron .   # s/p venofer  x4- mild improvement- Hb 10.4- proceed with venofer  twice weekly x 2- total 4 more infusion  # Etiology of iron  deficiency: Unclear;-  s/p previous d GI evaluation-EGD colonoscopy; Bobbi Burow study [2019]- [facility]- ?  Defer to PCP regarding referral to surgery for hemorrhoidal bleeding.   # A.fib on xarelo [Dr.Khan]-monitor closely for anemia..  # DISPOSITION: # venofer   today # venofer  twice week- next week; and 1 more the following week # follow up 3  month- MD; labs- cbc/bmp;LDH; iron  studies; ferritin- possible venofer - Dr.B   All questions were answered. The patient knows to call the clinic with any problems, questions or concerns.    Gwyn Leos, MD 05/17/2023 10:52 AM

## 2023-05-17 NOTE — Patient Instructions (Signed)
 Iron Sucrose Injection What is this medication? IRON SUCROSE (EYE ern SOO krose) treats low levels of iron (iron deficiency anemia) in people with kidney disease. Iron is a mineral that plays an important role in making red blood cells, which carry oxygen from your lungs to the rest of your body. This medicine may be used for other purposes; ask your health care provider or pharmacist if you have questions. COMMON BRAND NAME(S): Venofer What should I tell my care team before I take this medication? They need to know if you have any of these conditions: Anemia not caused by low iron levels Heart disease High levels of iron in the blood Kidney disease Liver disease An unusual or allergic reaction to iron, other medications, foods, dyes, or preservatives Pregnant or trying to get pregnant Breastfeeding How should I use this medication? This medication is for infusion into a vein. It is given in a hospital or clinic setting. Talk to your care team about the use of this medication in children. While this medication may be prescribed for children as young as 2 years for selected conditions, precautions do apply. Overdosage: If you think you have taken too much of this medicine contact a poison control center or emergency room at once. NOTE: This medicine is only for you. Do not share this medicine with others. What if I miss a dose? Keep appointments for follow-up doses. It is important not to miss your dose. Call your care team if you are unable to keep an appointment. What may interact with this medication? Do not take this medication with any of the following: Deferoxamine Dimercaprol Other iron products This medication may also interact with the following: Chloramphenicol Deferasirox This list may not describe all possible interactions. Give your health care provider a list of all the medicines, herbs, non-prescription drugs, or dietary supplements you use. Also tell them if you smoke,  drink alcohol, or use illegal drugs. Some items may interact with your medicine. What should I watch for while using this medication? Visit your care team regularly. Tell your care team if your symptoms do not start to get better or if they get worse. You may need blood work done while you are taking this medication. You may need to follow a special diet. Talk to your care team. Foods that contain iron include: whole grains/cereals, dried fruits, beans, or peas, leafy green vegetables, and organ meats (liver, kidney). What side effects may I notice from receiving this medication? Side effects that you should report to your care team as soon as possible: Allergic reactions--skin rash, itching, hives, swelling of the face, lips, tongue, or throat Low blood pressure--dizziness, feeling faint or lightheaded, blurry vision Shortness of breath Side effects that usually do not require medical attention (report to your care team if they continue or are bothersome): Flushing Headache Joint pain Muscle pain Nausea Pain, redness, or irritation at injection site This list may not describe all possible side effects. Call your doctor for medical advice about side effects. You may report side effects to FDA at 1-800-FDA-1088. Where should I keep my medication? This medication is given in a hospital or clinic. It will not be stored at home. NOTE: This sheet is a summary. It may not cover all possible information. If you have questions about this medicine, talk to your doctor, pharmacist, or health care provider.  2024 Elsevier/Gold Standard (2022-09-23 00:00:00)

## 2023-05-17 NOTE — Progress Notes (Signed)
 Fatigue/weakness: yes Dyspena: no  Light headedness: yes when standing Blood in stool: no

## 2023-05-22 ENCOUNTER — Inpatient Hospital Stay: Payer: Medicare HMO

## 2023-05-22 VITALS — BP 139/70 | HR 76 | Temp 96.7°F | Resp 18

## 2023-05-22 DIAGNOSIS — I4891 Unspecified atrial fibrillation: Secondary | ICD-10-CM | POA: Diagnosis not present

## 2023-05-22 DIAGNOSIS — E119 Type 2 diabetes mellitus without complications: Secondary | ICD-10-CM | POA: Diagnosis not present

## 2023-05-22 DIAGNOSIS — E611 Iron deficiency: Secondary | ICD-10-CM | POA: Diagnosis not present

## 2023-05-22 DIAGNOSIS — K649 Unspecified hemorrhoids: Secondary | ICD-10-CM | POA: Diagnosis not present

## 2023-05-22 DIAGNOSIS — R0602 Shortness of breath: Secondary | ICD-10-CM | POA: Diagnosis not present

## 2023-05-22 DIAGNOSIS — D649 Anemia, unspecified: Secondary | ICD-10-CM | POA: Diagnosis not present

## 2023-05-22 DIAGNOSIS — R5383 Other fatigue: Secondary | ICD-10-CM | POA: Diagnosis not present

## 2023-05-22 DIAGNOSIS — I10 Essential (primary) hypertension: Secondary | ICD-10-CM | POA: Diagnosis not present

## 2023-05-22 DIAGNOSIS — D500 Iron deficiency anemia secondary to blood loss (chronic): Secondary | ICD-10-CM

## 2023-05-22 DIAGNOSIS — K219 Gastro-esophageal reflux disease without esophagitis: Secondary | ICD-10-CM | POA: Diagnosis not present

## 2023-05-22 MED ORDER — SODIUM CHLORIDE 0.9% FLUSH
10.0000 mL | Freq: Once | INTRAVENOUS | Status: AC | PRN
  Administered 2023-05-22: 10 mL
  Filled 2023-05-22: qty 10

## 2023-05-22 MED ORDER — IRON SUCROSE 20 MG/ML IV SOLN
200.0000 mg | Freq: Once | INTRAVENOUS | Status: AC
  Administered 2023-05-22: 200 mg via INTRAVENOUS
  Filled 2023-05-22: qty 10

## 2023-05-25 ENCOUNTER — Inpatient Hospital Stay: Payer: Medicare HMO

## 2023-05-25 VITALS — BP 138/80 | HR 76 | Temp 97.5°F | Resp 17

## 2023-05-25 DIAGNOSIS — R0602 Shortness of breath: Secondary | ICD-10-CM | POA: Diagnosis not present

## 2023-05-25 DIAGNOSIS — E611 Iron deficiency: Secondary | ICD-10-CM | POA: Diagnosis not present

## 2023-05-25 DIAGNOSIS — D500 Iron deficiency anemia secondary to blood loss (chronic): Secondary | ICD-10-CM

## 2023-05-25 DIAGNOSIS — K219 Gastro-esophageal reflux disease without esophagitis: Secondary | ICD-10-CM | POA: Diagnosis not present

## 2023-05-25 DIAGNOSIS — E119 Type 2 diabetes mellitus without complications: Secondary | ICD-10-CM | POA: Diagnosis not present

## 2023-05-25 DIAGNOSIS — R5383 Other fatigue: Secondary | ICD-10-CM | POA: Diagnosis not present

## 2023-05-25 DIAGNOSIS — I10 Essential (primary) hypertension: Secondary | ICD-10-CM | POA: Diagnosis not present

## 2023-05-25 DIAGNOSIS — K649 Unspecified hemorrhoids: Secondary | ICD-10-CM | POA: Diagnosis not present

## 2023-05-25 DIAGNOSIS — I4891 Unspecified atrial fibrillation: Secondary | ICD-10-CM | POA: Diagnosis not present

## 2023-05-25 DIAGNOSIS — D649 Anemia, unspecified: Secondary | ICD-10-CM | POA: Diagnosis not present

## 2023-05-25 MED ORDER — IRON SUCROSE 20 MG/ML IV SOLN
200.0000 mg | Freq: Once | INTRAVENOUS | Status: AC
  Administered 2023-05-25: 200 mg via INTRAVENOUS

## 2023-05-25 NOTE — Patient Instructions (Signed)
 [facility] - A DEPT OF [facility]  Discharge Instructions: Thank you for choosing [facility] to provide your oncology and hematology care.  If you have a lab appointment with the Cancer Center, please go directly to the Cancer Center and check in at the registration area.  Wear comfortable clothing and clothing appropriate for easy access to any Portacath or PICC line.   We strive to give you quality time with your provider. You may need to reschedule your appointment if you arrive late (15 or more minutes).  Arriving late affects you and other patients whose appointments are after yours.  Also, if you miss three or more appointments without notifying the office, you may be dismissed from the clinic at the provider's discretion.      For prescription refill requests, have your pharmacy contact our office and allow 72 hours for refills to be completed.    Today you received the following chemotherapy and/or immunotherapy agents Venofer.      To help prevent nausea and vomiting after your treatment, we encourage you to take your nausea medication as directed.  BELOW ARE SYMPTOMS THAT SHOULD BE REPORTED IMMEDIATELY: *FEVER GREATER THAN 100.4 F (38 C) OR HIGHER *CHILLS OR SWEATING *NAUSEA AND VOMITING THAT IS NOT CONTROLLED WITH YOUR NAUSEA MEDICATION *UNUSUAL SHORTNESS OF BREATH *UNUSUAL BRUISING OR BLEEDING *URINARY PROBLEMS (pain or burning when urinating, or frequent urination) *BOWEL PROBLEMS (unusual diarrhea, constipation, pain near the anus) TENDERNESS IN MOUTH AND THROAT WITH OR WITHOUT PRESENCE OF ULCERS (sore throat, sores in mouth, or a toothache) UNUSUAL RASH, SWELLING OR PAIN  UNUSUAL VAGINAL DISCHARGE OR ITCHING   Items with * indicate a potential emergency and should be followed up as soon as possible or go to the Emergency Department if any problems should occur.  Please show the CHEMOTHERAPY ALERT CARD or IMMUNOTHERAPY  ALERT CARD at check-in to the Emergency Department and triage nurse.  Should you have questions after your visit or need to cancel or reschedule your appointment, please contact [facility] - A DEPT OF [facility]  [phone] and follow the prompts.  Office hours are 8:00 a.m. to 4:30 p.m. Monday - Friday. Please note that voicemails left after 4:00 p.m. may not be returned until the following business day.  We are closed weekends and major holidays. You have access to a nurse at all times for urgent questions. Please call the main number to the clinic [phone] and follow the prompts.  For any non-urgent questions, you may also contact your provider using MyChart. We now offer e-Visits for anyone 70 and older to request care online for non-urgent symptoms. For details visit [email or web address].   Also download the MyChart app! Go to the app store, search "MyChart", open the app, select [facility], and log in with your MyChart username and password.

## 2023-05-30 ENCOUNTER — Inpatient Hospital Stay (HOSPITAL_BASED_OUTPATIENT_CLINIC_OR_DEPARTMENT_OTHER): Payer: Medicare HMO

## 2023-05-30 ENCOUNTER — Encounter: Payer: Self-pay | Admitting: Urology

## 2023-05-30 VITALS — BP 135/72 | HR 82 | Temp 97.6°F | Resp 18

## 2023-05-30 DIAGNOSIS — D649 Anemia, unspecified: Secondary | ICD-10-CM | POA: Diagnosis not present

## 2023-05-30 DIAGNOSIS — R0602 Shortness of breath: Secondary | ICD-10-CM | POA: Diagnosis not present

## 2023-05-30 DIAGNOSIS — K219 Gastro-esophageal reflux disease without esophagitis: Secondary | ICD-10-CM | POA: Diagnosis not present

## 2023-05-30 DIAGNOSIS — D500 Iron deficiency anemia secondary to blood loss (chronic): Secondary | ICD-10-CM

## 2023-05-30 DIAGNOSIS — I4891 Unspecified atrial fibrillation: Secondary | ICD-10-CM | POA: Diagnosis not present

## 2023-05-30 DIAGNOSIS — R5383 Other fatigue: Secondary | ICD-10-CM | POA: Diagnosis not present

## 2023-05-30 DIAGNOSIS — E611 Iron deficiency: Secondary | ICD-10-CM | POA: Diagnosis not present

## 2023-05-30 DIAGNOSIS — E119 Type 2 diabetes mellitus without complications: Secondary | ICD-10-CM | POA: Diagnosis not present

## 2023-05-30 DIAGNOSIS — K649 Unspecified hemorrhoids: Secondary | ICD-10-CM | POA: Diagnosis not present

## 2023-05-30 DIAGNOSIS — I10 Essential (primary) hypertension: Secondary | ICD-10-CM | POA: Diagnosis not present

## 2023-05-30 MED ORDER — IRON SUCROSE 20 MG/ML IV SOLN
200.0000 mg | Freq: Once | INTRAVENOUS | Status: AC
  Administered 2023-05-30: 200 mg via INTRAVENOUS

## 2023-05-30 NOTE — Patient Instructions (Signed)

## 2023-06-05 ENCOUNTER — Other Ambulatory Visit: Payer: Self-pay | Admitting: Internal Medicine

## 2023-06-05 ENCOUNTER — Ambulatory Visit: Payer: Medicare HMO | Admitting: Internal Medicine

## 2023-06-05 DIAGNOSIS — E119 Type 2 diabetes mellitus without complications: Secondary | ICD-10-CM

## 2023-06-05 MED ORDER — METFORMIN HCL 500 MG PO TABS: 500.0000 mg | ORAL_TABLET | Freq: Two times a day (BID) | ORAL | 3 refills | Status: DC

## 2023-06-05 MED ORDER — METFORMIN HCL 500 MG PO TABS: 500.0000 mg | ORAL_TABLET | Freq: Two times a day (BID) | ORAL | 0 refills | Status: DC

## 2023-06-19 ENCOUNTER — Ambulatory Visit: Payer: Medicare HMO | Admitting: Urology

## 2023-07-26 ENCOUNTER — Other Ambulatory Visit: Payer: Self-pay | Admitting: Cardiovascular Disease

## 2023-08-07 ENCOUNTER — Other Ambulatory Visit

## 2023-08-07 ENCOUNTER — Ambulatory Visit: Payer: Medicare HMO | Admitting: Urology

## 2023-08-07 DIAGNOSIS — I10 Essential (primary) hypertension: Secondary | ICD-10-CM | POA: Diagnosis not present

## 2023-08-07 DIAGNOSIS — E119 Type 2 diabetes mellitus without complications: Secondary | ICD-10-CM | POA: Diagnosis not present

## 2023-08-07 DIAGNOSIS — E782 Mixed hyperlipidemia: Secondary | ICD-10-CM | POA: Diagnosis not present

## 2023-08-08 ENCOUNTER — Ambulatory Visit: Payer: Medicare HMO | Admitting: Internal Medicine

## 2023-08-08 ENCOUNTER — Encounter: Payer: Self-pay | Admitting: Internal Medicine

## 2023-08-08 VITALS — BP 130/78 | HR 58 | Ht 70.0 in | Wt 256.0 lb

## 2023-08-08 DIAGNOSIS — I480 Paroxysmal atrial fibrillation: Secondary | ICD-10-CM

## 2023-08-08 DIAGNOSIS — K219 Gastro-esophageal reflux disease without esophagitis: Secondary | ICD-10-CM

## 2023-08-08 DIAGNOSIS — G4733 Obstructive sleep apnea (adult) (pediatric): Secondary | ICD-10-CM | POA: Diagnosis not present

## 2023-08-08 DIAGNOSIS — E1159 Type 2 diabetes mellitus with other circulatory complications: Secondary | ICD-10-CM | POA: Diagnosis not present

## 2023-08-08 DIAGNOSIS — I152 Hypertension secondary to endocrine disorders: Secondary | ICD-10-CM | POA: Diagnosis not present

## 2023-08-08 DIAGNOSIS — E782 Mixed hyperlipidemia: Secondary | ICD-10-CM | POA: Diagnosis not present

## 2023-08-08 DIAGNOSIS — E1169 Type 2 diabetes mellitus with other specified complication: Secondary | ICD-10-CM | POA: Diagnosis not present

## 2023-08-08 DIAGNOSIS — E119 Type 2 diabetes mellitus without complications: Secondary | ICD-10-CM

## 2023-08-08 LAB — CMP14+EGFR
ALT: 45 IU/L — ABNORMAL HIGH (ref 0–44)
AST: 61 IU/L — ABNORMAL HIGH (ref 0–40)
Albumin: 3.9 g/dL (ref 3.8–4.8)
Alkaline Phosphatase: 69 IU/L (ref 44–121)
BUN/Creatinine Ratio: 9 — ABNORMAL LOW (ref 10–24)
BUN: 8 mg/dL (ref 8–27)
Bilirubin Total: 0.2 mg/dL (ref 0.0–1.2)
CO2: 22 mmol/L (ref 20–29)
Calcium: 8.6 mg/dL (ref 8.6–10.2)
Chloride: 110 mmol/L — ABNORMAL HIGH (ref 96–106)
Creatinine, Ser: 0.9 mg/dL (ref 0.76–1.27)
Globulin, Total: 1.8 g/dL (ref 1.5–4.5)
Glucose: 116 mg/dL — ABNORMAL HIGH (ref 70–99)
Potassium: 3.8 mmol/L (ref 3.5–5.2)
Sodium: 143 mmol/L (ref 134–144)
Total Protein: 5.7 g/dL — ABNORMAL LOW (ref 6.0–8.5)
eGFR: 89 mL/min/{1.73_m2} (ref >59)

## 2023-08-08 LAB — LIPID PANEL
Chol/HDL Ratio: 2.1 ratio (ref 0.0–5.0)
Cholesterol, Total: 119 mg/dL (ref 100–199)
HDL: 58 mg/dL (ref >39)
LDL Chol Calc (NIH): 35 mg/dL (ref 0–99)
Triglycerides: 161 mg/dL — ABNORMAL HIGH (ref 0–149)
VLDL Cholesterol Cal: 26 mg/dL (ref 5–40)

## 2023-08-08 LAB — HEMOGLOBIN A1C
Est. average glucose Bld gHb Est-mCnc: 143 mg/dL
Hgb A1c MFr Bld: 6.6 % — ABNORMAL HIGH (ref 4.8–5.6)

## 2023-08-08 LAB — TSH: TSH: 2.02 u[IU]/mL (ref 0.450–4.500)

## 2023-08-08 LAB — POCT CBG (FASTING - GLUCOSE)-MANUAL ENTRY: Glucose Fasting, POC: 149 mg/dL — AB (ref 70–99)

## 2023-08-08 NOTE — Progress Notes (Signed)
 Established Patient Office Visit  Subjective:  Patient ID: Brandon Gibson, male    DOB: 11/27/1950  Age: 73 y.o. MRN: [id number]  Chief Complaint  Patient presents with   Follow-up    3 month follow up    Patient comes in for his follow-up today.  He is generally feeling well and has no new complaints.  Recently had labs done, results discussed today.  Mentions seasonal allergies to pollen which were severe few weeks ago.  But now getting much better.  He has been using his antihistamines and steroid nasal sprays.    No other concerns at this time.   Past Medical History:  Diagnosis Date   Arthritis    Atrial fibrillation (HCC)    BPH (benign prostatic hyperplasia)    Cancer (HCC)    HX SKIN CANCER Basal cell   Chickenpox    Clotting disorder (HCC)    Diabetes mellitus without complication (HCC)    type 2   Dysrhythmia    IRREG HEART BEAT   GERD (gastroesophageal reflux disease)    H/O pleurisy    Hypercholesteremia    Hyperlipidemia    Hypertension    Iron deficiency anemia due to chronic blood loss 09/18/2017   Lumbar stenosis    Measles    Mumps    Sleep apnea    sleep study Dr. Park Breed, uses CPAP    Past Surgical History:  Procedure Laterality Date   ANTERIOR CERVICAL DECOMP/DISCECTOMY FUSION  11/25/2011   Procedure: ANTERIOR CERVICAL DECOMPRESSION/DISCECTOMY FUSION 2 LEVELS;  Surgeon: Karn Cassis, MD;  [facility]: [facility];  Service: Neurosurgery;  Laterality: N/A;  Cervical four-five,Cervical five-six  Anterior cervical decompression/diskectomy, fusion, plate   BREAST BIOPSY Right    Benign   BREAST SURGERY Left 1986   lumpectomy   CARDIAC CATHETERIZATION     2011, [facility]   CARDIOVASCULAR STRESS TEST  2011   CERVICAL FUSION  1988   C 6/7    COLONOSCOPY WITH PROPOFOL N/A 11/08/2017   Procedure: COLONOSCOPY WITH PROPOFOL;  Surgeon: Toledo, Boykin Nearing, MD;  Location: [facility];  Service: Gastroenterology;  Laterality: N/A;    ESOPHAGOGASTRODUODENOSCOPY (EGD) WITH PROPOFOL N/A 11/08/2017   Procedure: ESOPHAGOGASTRODUODENOSCOPY (EGD) WITH PROPOFOL;  Surgeon: Toledo, Boykin Nearing, MD;  Location: [facility];  Service: Gastroenterology;  Laterality: N/A;   EYE SURGERY     LASIK   JOINT REPLACEMENT     KNEE ARTHROPLASTY Right 10/05/2015   Procedure: COMPUTER ASSISTED TOTAL KNEE ARTHROPLASTY;  Surgeon: Donato Heinz, MD;  Location: [facility];  Service: Orthopedics;  Laterality: Right;   KNEE ARTHROSCOPY  1986   Right   KNEE ARTHROSCOPY Left 08/10/2015   Procedure: LEFT KNEE ARTHROSCOPY, CHONDROPLASTY, MEDIAL MENISECTOMY;  Surgeon: Donato Heinz, MD;  Location: [facility];  Service: Orthopedics;  Laterality: Left;   LAMINECTOMY WITH POSTERIOR LATERAL ARTHRODESIS LEVEL 2 N/A 07/16/2018   Procedure: Posterior lumbar fusion with instrumentation at L3-4 with repeat facetectomy L3-4;  Surgeon: Tia Alert, MD;  Location: [facility] OR;  Service: Neurosurgery;  Laterality: N/A;  Posterior lumbar fusion with instrumentation at L3-4 with repeat facetectomy L3-4   LUMBAR LAMINECTOMY/DECOMPRESSION MICRODISCECTOMY N/A 10/04/2013   Procedure: LUMBAR TWO TO THREE LUMBAR LAMINECTOMY/DECOMPRESSION MICRODISCECTOMY 1 LEVEL;  Surgeon: Karn Cassis, MD;  [facility];  Service: Neurosurgery;  Laterality: N/A;  L2-3 Laminectomy   POSTERIOR LAMINECTOMY / DECOMPRESSION LUMBAR SPINE  2006   TRANSESOPHAGEAL ECHOCARDIOGRAM  2011    Social History  Socioeconomic History   Marital status: Widowed    Spouse name: Not on file   Number of children: Not on file   Years of education: Not on file   Highest education level: Not on file  Occupational History   Not on file  Tobacco Use   Smoking status: Never   Smokeless tobacco: Never  Vaping Use   Vaping status: Never Used  Substance and Sexual Activity   Alcohol use: Yes    Alcohol/week: 14.0 standard drinks of alcohol    Types: 14 Glasses of wine per week   Drug use: No   Sexual  activity: Not on file  Other Topics Concern   Not on file  Social History Narrative   Not on file   Social Drivers of Health   Financial Resource Strain: Not on file  Food Insecurity: No Food Insecurity (03/16/2023)   Hunger Vital Sign    Worried About Running Out of Food in the Last Year: Never true    Ran Out of Food in the Last Year: Never true  Transportation Needs: No Transportation Needs (03/16/2023)   PRAPARE - Administrator, Civil Service (Medical): No    Lack of Transportation (Non-Medical): No  Physical Activity: Not on file  Stress: Not on file  Social Connections: Not on file  Intimate Partner Violence: Not At Risk (03/16/2023)   Humiliation, Afraid, Rape, and Kick questionnaire    Fear of Current or Ex-Partner: No    Emotionally Abused: No    Physically Abused: No    Sexually Abused: No    Family History  Problem Relation Age of Onset   Leukemia Father    Diabetes Father    Heart disease Mother    Prostate cancer Neg Hx    Chronic Renal Failure Neg Hx    Breast cancer Neg Hx     Allergies  Allergen Reactions   Morphine And Codeine Anaphylaxis   Ace Inhibitors Swelling    Other reaction(s): Unknown   Diazepam    Tape     Outpatient Medications Prior to Visit  Medication Sig   ACCU-CHEK AVIVA PLUS test strip TEST BLOOD SUGAR EVERY DAY   amiodarone (PACERONE) 200 MG tablet TAKE 1 TABLET EVERY DAY (Patient taking differently: Take 200 mg by mouth daily. Takes 1/2 tablet daily)   apixaban (ELIQUIS) 5 MG TABS tablet Take 1 tablet (5 mg total) by mouth 2 (two) times daily.   chlorthalidone (HYGROTON) 25 MG tablet TAKE 1 TABLET EVERY DAY   docusate (COLACE) 50 MG/5ML liquid Take by mouth daily.   fluticasone (FLONASE) 50 MCG/ACT nasal spray Place 2 sprays into both nostrils daily.   hydrALAZINE (APRESOLINE) 50 MG tablet TAKE 1 TABLET TWICE DAILY   Iron-Vitamin C 65-125 MG TABS Take 1 tablet by mouth 2 (two) times daily.   loratadine (CLARITIN)  10 MG tablet Take 1 tablet (10 mg total) by mouth daily.   metFORMIN (GLUCOPHAGE) 500 MG tablet Take 1 tablet (500 mg total) by mouth 2 (two) times daily with a meal. 2 tabs in the morning one at night   metoprolol succinate (TOPROL-XL) 50 MG 24 hr tablet TAKE 1 TABLET EVERY DAY   omeprazole (PRILOSEC) 40 MG capsule TAKE 1 CAPSULE EVERY DAY 1/2 HOUR BEFORE A MEAL   potassium chloride (KLOR-CON) 10 MEQ tablet TAKE 1 TABLET EVERY DAY   rosuvastatin (CRESTOR) 20 MG tablet TAKE 1 TABLET EVERY DAY   tamsulosin (FLOMAX) 0.4 MG CAPS capsule Take 1  capsule (0.4 mg total) by mouth daily.   No facility-administered medications prior to visit.    Review of Systems  Constitutional: Negative.  Negative for chills, fever, malaise/fatigue and weight loss.  HENT: Negative.  Negative for congestion, sinus pain and sore throat.   Eyes: Negative.   Respiratory: Negative.  Negative for cough and shortness of breath.   Cardiovascular: Negative.  Negative for chest pain, palpitations and leg swelling.  Gastrointestinal: Negative.  Negative for abdominal pain, constipation, diarrhea, heartburn, nausea and vomiting.  Genitourinary: Negative.  Negative for dysuria and flank pain.  Musculoskeletal: Negative.  Negative for joint pain and myalgias.  Skin: Negative.   Neurological: Negative.  Negative for dizziness, tingling, tremors and headaches.  Endo/Heme/Allergies: Negative.   Psychiatric/Behavioral: Negative.  Negative for depression and suicidal ideas. The patient is not nervous/anxious.        Objective:   BP 130/78   Pulse (!) 58   Ht 5\' 10"  (1.778 m)   Wt 256 lb (116.1 kg)   SpO2 97%   BMI 36.73 kg/m   Vitals:   08/08/23 0905  BP: 130/78  Pulse: (!) 58  Height: 5\' 10"  (1.778 m)  Weight: 256 lb (116.1 kg)  SpO2: 97%  BMI (Calculated): 36.73    Physical Exam Vitals and nursing note reviewed.  Constitutional:      Appearance: Normal appearance.  HENT:     Head: Normocephalic and  atraumatic.     Nose: Nose normal.     Mouth/Throat:     Mouth: Mucous membranes are moist.     Pharynx: Oropharynx is clear.  Eyes:     Conjunctiva/sclera: Conjunctivae normal.     Pupils: Pupils are equal, round, and reactive to light.  Cardiovascular:     Rate and Rhythm: Normal rate and regular rhythm.     Pulses: Normal pulses.     Heart sounds: Normal heart sounds.  Pulmonary:     Effort: Pulmonary effort is normal.     Breath sounds: Normal breath sounds.  Abdominal:     General: Bowel sounds are normal.     Palpations: Abdomen is soft.  Musculoskeletal:        General: Normal range of motion.     Cervical back: Normal range of motion.  Skin:    General: Skin is warm and dry.  Neurological:     General: No focal deficit present.     Mental Status: He is alert and oriented to person, place, and time.  Psychiatric:        Mood and Affect: Mood normal.        Behavior: Behavior normal.        Judgment: Judgment normal.      Results for orders placed or performed in visit on 08/08/23  POCT CBG (Fasting - Glucose)  Result Value Ref Range   Glucose Fasting, POC 149 (A) 70 - 99 mg/dL    Recent Results (from the past 2160 hours)  Ferritin     Status: Abnormal   Collection Time: 05/17/23  9:38 AM  Result Value Ref Range   Ferritin 14 (L) 24 - 336 ng/mL    Comment: Performed at [facility], [address] [place], [place] [address]  Iron and TIBC     Status: Abnormal   Collection Time: 05/17/23  9:38 AM  Result Value Ref Range   Iron 33 (L) 45 - 182 ug/dL   TIBC 604 (H) 540 - 981 ug/dL   Saturation Ratios  6 (L) 17.9 - 39.5 %   UIBC 561 ug/dL    Comment: Performed at [facility], [address] [place], [place] [address]  Lactate dehydrogenase     Status: None   Collection Time: 05/17/23  9:38 AM  Result Value Ref Range   LDH 127 98 - 192 U/L    Comment: Performed at [facility], [address] [place], [place] [address]  Basic  metabolic panel     Status: Abnormal   Collection Time: 05/17/23  9:38 AM  Result Value Ref Range   Sodium 137 135 - 145 mmol/L   Potassium 3.5 3.5 - 5.1 mmol/L   Chloride 100 98 - 111 mmol/L   CO2 24 22 - 32 mmol/L   Glucose, Bld 170 (H) 70 - 99 mg/dL    Comment: Glucose reference range applies only to samples taken after fasting for at least 8 hours.   BUN 12 8 - 23 mg/dL   Creatinine, Ser 8.65 0.61 - 1.24 mg/dL   Calcium 9.1 8.9 - 78.4 mg/dL   GFR, Estimated >69 >62 mL/min    Comment: (NOTE) Calculated using the CKD-EPI Creatinine Equation (2021)    Anion gap 13 5 - 15    Comment: Performed at [facility], [address] [place], [place] [address]  CBC with Differential (Cancer Center Only)     Status: Abnormal   Collection Time: 05/17/23  9:38 AM  Result Value Ref Range   WBC Count 3.9 (L) 4.0 - 10.5 K/uL   RBC 4.84 4.22 - 5.81 MIL/uL   Hemoglobin 10.7 (L) 13.0 - 17.0 g/dL    Comment: Reticulocyte Hemoglobin testing may be clinically indicated, consider ordering this additional test [id number]    HCT 36.5 (L) 39.0 - 52.0 %   MCV 75.4 (L) 80.0 - 100.0 fL   MCH 22.1 (L) 26.0 - 34.0 pg   MCHC 29.3 (L) 30.0 - 36.0 g/dL   RDW 27.2 (H) 53.6 - 64.4 %   Platelet Count 159 150 - 400 K/uL   nRBC 0.0 0.0 - 0.2 %   Neutrophils Relative % 60 %   Neutro Abs 2.3 1.7 - 7.7 K/uL   Lymphocytes Relative 23 %   Lymphs Abs 0.9 0.7 - 4.0 K/uL   Monocytes Relative 12 %   Monocytes Absolute 0.5 0.1 - 1.0 K/uL   Eosinophils Relative 3 %   Eosinophils Absolute 0.1 0.0 - 0.5 K/uL   Basophils Relative 1 %   Basophils Absolute 0.0 0.0 - 0.1 K/uL   Immature Granulocytes 1 %   Abs Immature Granulocytes 0.02 0.00 - 0.07 K/uL    Comment: Performed at [facility], [address] [place], [place] [address]  Hemoglobin A1c     Status: Abnormal   Collection Time: 08/07/23  9:15 AM  Result Value Ref Range   Hgb A1c MFr Bld 6.6 (H) 4.8 - 5.6 %    Comment:          Prediabetes: 5.7 -  6.4          Diabetes: >6.4          Glycemic control for adults with diabetes: <7.0    Est. average glucose Bld gHb Est-mCnc 143 mg/dL  TSH     Status: None   Collection Time: 08/07/23  9:15 AM  Result Value Ref Range   TSH 2.020 0.450 - 4.500 uIU/mL  CMP14+EGFR     Status: Abnormal   Collection Time: 08/07/23  9:15 AM  Result Value Ref Range   Glucose 116 (H) 70 - 99 mg/dL   BUN 8 8 - 27 mg/dL   Creatinine, Ser 1.61 0.76 - 1.27 mg/dL   eGFR 89 >09 UE/AVW/0.98   BUN/Creatinine Ratio 9 (L) 10 - 24   Sodium 143 134 - 144 mmol/L   Potassium 3.8 3.5 - 5.2 mmol/L   Chloride 110 (H) 96 - 106 mmol/L   CO2 22 20 - 29 mmol/L   Calcium 8.6 8.6 - 10.2 mg/dL   Total Protein 5.7 (L) 6.0 - 8.5 g/dL   Albumin 3.9 3.8 - 4.8 g/dL   Globulin, Total 1.8 1.5 - 4.5 g/dL   Bilirubin Total 0.2 0.0 - 1.2 mg/dL   Alkaline Phosphatase 69 44 - 121 IU/L   AST 61 (H) 0 - 40 IU/L   ALT 45 (H) 0 - 44 IU/L  Lipid panel     Status: Abnormal   Collection Time: 08/07/23  9:15 AM  Result Value Ref Range   Cholesterol, Total 119 100 - 199 mg/dL   Triglycerides 119 (H) 0 - 149 mg/dL   HDL 58 >14 mg/dL   VLDL Cholesterol Cal 26 5 - 40 mg/dL   LDL Chol Calc (NIH) 35 0 - 99 mg/dL   Chol/HDL Ratio 2.1 0.0 - 5.0 ratio    Comment:                                   T. Chol/HDL Ratio                                             Men  Women                               1/2 Avg.Risk  3.4    3.3                                   Avg.Risk  5.0    4.4                                2X Avg.Risk  9.6    7.1                                3X Avg.Risk 23.4   11.0   POCT CBG (Fasting - Glucose)     Status: Abnormal   Collection Time: 08/08/23  9:09 AM  Result Value Ref Range   Glucose Fasting, POC 149 (A) 70 - 99 mg/dL      Assessment & Plan:  Continue all medications.  Strict diet control and exercise emphasized. Problem List Items Addressed This Visit     Diabetes mellitus type 2, uncomplicated (HCC)   Relevant  Orders   POCT CBG (Fasting - Glucose) (Completed)   GERD (gastroesophageal reflux disease)   Obstructive sleep apnea   Hypertension associated with diabetes (HCC) - Primary   Combined hyperlipidemia associated with type 2 diabetes mellitus (HCC)   Other Visit Diagnoses       Paroxysmal A-fib (  HCC)           Return in about 4 months (around 12/08/2023).   Total time spent: 30 minutes  Margaretann Loveless, MD  08/08/2023   This document may have been prepared by [facility] Voice Recognition software and as such may include unintentional dictation errors.

## 2023-08-14 ENCOUNTER — Ambulatory Visit (INDEPENDENT_AMBULATORY_CARE_PROVIDER_SITE_OTHER): Payer: Self-pay | Admitting: Urology

## 2023-08-14 ENCOUNTER — Encounter: Payer: Self-pay | Admitting: Internal Medicine

## 2023-08-14 ENCOUNTER — Encounter: Payer: Self-pay | Admitting: Urology

## 2023-08-14 VITALS — BP 145/83 | HR 89 | Ht 70.0 in | Wt 252.0 lb

## 2023-08-14 DIAGNOSIS — N3020 Other chronic cystitis without hematuria: Secondary | ICD-10-CM

## 2023-08-14 DIAGNOSIS — N3001 Acute cystitis with hematuria: Secondary | ICD-10-CM

## 2023-08-14 DIAGNOSIS — N401 Enlarged prostate with lower urinary tract symptoms: Secondary | ICD-10-CM | POA: Diagnosis not present

## 2023-08-14 DIAGNOSIS — R350 Frequency of micturition: Secondary | ICD-10-CM | POA: Diagnosis not present

## 2023-08-14 LAB — URINALYSIS, COMPLETE
Bilirubin, UA: NEGATIVE
Glucose, UA: NEGATIVE
Leukocytes,UA: NEGATIVE
Nitrite, UA: NEGATIVE
RBC, UA: NEGATIVE
Specific Gravity, UA: 1.025 (ref 1.005–1.030)
Urobilinogen, Ur: 0.2 mg/dL (ref 0.2–1.0)
pH, UA: 5.5 (ref 5.0–7.5)

## 2023-08-14 LAB — MICROSCOPIC EXAMINATION

## 2023-08-14 MED ORDER — TRIMETHOPRIM 100 MG PO TABS: 100.0000 mg | ORAL_TABLET | Freq: Every day | ORAL | 3 refills | Status: DC

## 2023-08-14 NOTE — Progress Notes (Signed)
 08/14/2023 9:58 AM   Brandon Gibson 04-03-1951 [id number]  Referring provider: Margaretann Loveless, MD [address] [place],  [place] [address]  Chief Complaint  Patient presents with   Follow-up    HPI: Reviewed chart.  Patient followed by Dr. Lonna Cobb as well.  On Flomax.  Voids infrequently during the day.  I saw him in August 2022.  He was given ciprofloxacin for a month.  Did not give him Mobic.  Last culture negative.  He was dramatically better on the Cipro.  He had a normal renal ultrasound in 2019.   Patient immediately got better last time we treated him.  On Wednesday [place] and started having burning with a little bit of terminal blood in the urine.  No fever.  He does get positive urine cultures   Patient was given ciprofloxacin 500 mg twice a day for 30 days.  Urine sent for culture.  Recognizing limitations I put him on trimethoprim 100 mg 3x11.  Reassess in 3 months to see if he still has microscopic hematuria.  If he does I will recommend work-up   Today Frequency stable.  Urine culture was positive on that day sensitive to ciprofloxacin.  No burning or blood and feels good.  On trimethoprim.  No microscopic hematuria     Today Frequency stable.  Has not been back home since last fall.  No infections or prostatitis on daily trimethoprim.  Flow stable. 50 g benign prostate both prescription renewed and I will see you in 1 year  Today Frequency stable.  No infections.  Very pleased on trimethoprim. 50 g benign prostate   PMH: Past Medical History:  Diagnosis Date   Arthritis    Atrial fibrillation (HCC)    BPH (benign prostatic hyperplasia)    Cancer (HCC)    HX SKIN CANCER Basal cell   Chickenpox    Clotting disorder (HCC)    Diabetes mellitus without complication (HCC)    type 2   Dysrhythmia    IRREG HEART BEAT   GERD (gastroesophageal reflux disease)    H/O pleurisy    Hypercholesteremia    Hyperlipidemia    Hypertension     Iron deficiency anemia due to chronic blood loss 09/18/2017   Lumbar stenosis    Measles    Mumps    Sleep apnea    sleep study Dr. Park Breed, uses CPAP    Surgical History: Past Surgical History:  Procedure Laterality Date   ANTERIOR CERVICAL DECOMP/DISCECTOMY FUSION  11/25/2011   Procedure: ANTERIOR CERVICAL DECOMPRESSION/DISCECTOMY FUSION 2 LEVELS;  Surgeon: Karn Cassis, MD;  Location: [facility];  Service: Neurosurgery;  Laterality: N/A;  Cervical four-five,Cervical five-six  Anterior cervical decompression/diskectomy, fusion, plate   BREAST BIOPSY Right    Benign   BREAST SURGERY Left 1986   lumpectomy   CARDIAC CATHETERIZATION     2011, [facility]   CARDIOVASCULAR STRESS TEST  2011   CERVICAL FUSION  1988   C 6/7    COLONOSCOPY WITH PROPOFOL N/A 11/08/2017   Procedure: COLONOSCOPY WITH PROPOFOL;  Surgeon: Toledo, Boykin Nearing, MD;  Location: [facility];  Service: Gastroenterology;  Laterality: N/A;   ESOPHAGOGASTRODUODENOSCOPY (EGD) WITH PROPOFOL N/A 11/08/2017   Procedure: ESOPHAGOGASTRODUODENOSCOPY (EGD) WITH PROPOFOL;  Surgeon: Toledo, Boykin Nearing, MD;  Location: [facility];  Service: Gastroenterology;  Laterality: N/A;   EYE SURGERY     LASIK   JOINT REPLACEMENT     KNEE ARTHROPLASTY Right 10/05/2015  Procedure: COMPUTER ASSISTED TOTAL KNEE ARTHROPLASTY;  Surgeon: Donato Heinz, MD;  Location: [facility];  Service: Orthopedics;  Laterality: Right;   KNEE ARTHROSCOPY  1986   Right   KNEE ARTHROSCOPY Left 08/10/2015   Procedure: LEFT KNEE ARTHROSCOPY, CHONDROPLASTY, MEDIAL MENISECTOMY;  Surgeon: Donato Heinz, MD;  Location: [facility];  Service: Orthopedics;  Laterality: Left;   LAMINECTOMY WITH POSTERIOR LATERAL ARTHRODESIS LEVEL 2 N/A 07/16/2018   Procedure: Posterior lumbar fusion with instrumentation at L3-4 with repeat facetectomy L3-4;  Surgeon: Tia Alert, MD;  Location: [facility] OR;  Service: Neurosurgery;  Laterality: N/A;  Posterior lumbar fusion with  instrumentation at L3-4 with repeat facetectomy L3-4   LUMBAR LAMINECTOMY/DECOMPRESSION MICRODISCECTOMY N/A 10/04/2013   Procedure: LUMBAR TWO TO THREE LUMBAR LAMINECTOMY/DECOMPRESSION MICRODISCECTOMY 1 LEVEL;  Surgeon: Karn Cassis, MD;  Location: [facility];  Service: Neurosurgery;  Laterality: N/A;  L2-3 Laminectomy   POSTERIOR LAMINECTOMY / DECOMPRESSION LUMBAR SPINE  2006   TRANSESOPHAGEAL ECHOCARDIOGRAM  2011    Home Medications:  Allergies as of 08/14/2023       Reactions   Morphine And Codeine Anaphylaxis   Ace Inhibitors Swelling   Other reaction(s): Unknown   Diazepam    Tape         Medication List        Accurate as of August 14, 2023  9:58 AM. If you have any questions, ask your nurse or doctor.          Accu-Chek Aviva Plus test strip Generic drug: glucose blood TEST BLOOD SUGAR EVERY DAY   amiodarone 200 MG tablet Commonly known as: PACERONE TAKE 1 TABLET EVERY DAY What changed: additional instructions   apixaban 5 MG Tabs tablet Commonly known as: ELIQUIS Take 1 tablet (5 mg total) by mouth 2 (two) times daily.   chlorthalidone 25 MG tablet Commonly known as: HYGROTON TAKE 1 TABLET EVERY DAY   docusate 50 MG/5ML liquid Commonly known as: COLACE Take by mouth daily.   fluticasone 50 MCG/ACT nasal spray Commonly known as: FLONASE Place 2 sprays into both nostrils daily.   hydrALAZINE 50 MG tablet Commonly known as: APRESOLINE TAKE 1 TABLET TWICE DAILY   Iron-Vitamin C 65-125 MG Tabs Take 1 tablet by mouth 2 (two) times daily.   loratadine 10 MG tablet Commonly known as: Claritin Take 1 tablet (10 mg total) by mouth daily.   metFORMIN 500 MG tablet Commonly known as: GLUCOPHAGE Take 1 tablet (500 mg total) by mouth 2 (two) times daily with a meal. 2 tabs in the morning one at night   metoprolol succinate 50 MG 24 hr tablet Commonly known as: TOPROL-XL TAKE 1 TABLET EVERY DAY   omeprazole 40 MG capsule Commonly known as:  PRILOSEC TAKE 1 CAPSULE EVERY DAY 1/2 HOUR BEFORE A MEAL   potassium chloride 10 MEQ tablet Commonly known as: KLOR-CON TAKE 1 TABLET EVERY DAY   rosuvastatin 20 MG tablet Commonly known as: CRESTOR TAKE 1 TABLET EVERY DAY   tamsulosin 0.4 MG Caps capsule Commonly known as: FLOMAX Take 1 capsule (0.4 mg total) by mouth daily.        Allergies:  Allergies  Allergen Reactions   Morphine And Codeine Anaphylaxis   Ace Inhibitors Swelling    Other reaction(s): Unknown   Diazepam    Tape     Family History: Family History  Problem Relation Age of Onset   Leukemia Father    Diabetes Father    Heart disease Mother  Prostate cancer Neg Hx    Chronic Renal Failure Neg Hx    Breast cancer Neg Hx     Social History:  reports that he has never smoked. He has never used smokeless tobacco. He reports current alcohol use of about 14.0 standard drinks of alcohol per week. He reports that he does not use drugs.  ROS:                                        Physical Exam: BP (!) 145/83   Pulse 89   Ht 5\' 10"  (1.778 m)   Wt 114.3 kg   BMI 36.16 kg/m   Constitutional:  Alert and oriented, No acute distress. HEENT: [place] AT, moist mucus membranes.  Trachea midline, no masses.  Laboratory Data: Lab Results  Component Value Date   WBC 3.9 (L) 05/17/2023   HGB 10.7 (L) 05/17/2023   HCT 36.5 (L) 05/17/2023   MCV 75.4 (L) 05/17/2023   PLT 159 05/17/2023    Lab Results  Component Value Date   CREATININE 0.90 08/07/2023    No results found for: "PSA"  Lab Results  Component Value Date   TESTOSTERONE 165 (L) 09/04/2019    Lab Results  Component Value Date   HGBA1C 6.6 (H) 08/07/2023    Urinalysis    Component Value Date/Time   COLORURINE YELLOW (A) 09/23/2015 0824   APPEARANCEUR Clear 06/20/2022 1002   LABSPEC 1.017 09/23/2015 0824   PHURINE 6.0 09/23/2015 0824   GLUCOSEU Trace (A) 06/20/2022 1002   HGBUR NEGATIVE 09/23/2015 0824    BILIRUBINUR Negative 06/20/2022 1002   KETONESUR NEGATIVE 09/23/2015 0824   PROTEINUR 1+ (A) 06/20/2022 1002   PROTEINUR NEGATIVE 09/23/2015 0824   NITRITE Negative 06/20/2022 1002   NITRITE NEGATIVE 09/23/2015 0824   LEUKOCYTESUR Negative 06/20/2022 1002    Pertinent Imaging:   Assessment & Plan: Trimethoprim 100 mg 90 x 3 sent to pharmacy.  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 Acute cystitis with hematuria (Primary)  - Urinalysis, Complete  2. Benign prostatic hyperplasia with urinary frequency  - Urinalysis, Complete   No follow-ups on file.  Devorah Fonder, MD  [facility] [address] [place], [place] [address] [phone]

## 2023-08-15 ENCOUNTER — Encounter: Payer: Self-pay | Admitting: Internal Medicine

## 2023-08-15 ENCOUNTER — Inpatient Hospital Stay: Payer: Medicare HMO

## 2023-08-15 ENCOUNTER — Inpatient Hospital Stay: Payer: Medicare HMO | Attending: Internal Medicine

## 2023-08-15 ENCOUNTER — Other Ambulatory Visit: Payer: Self-pay

## 2023-08-15 ENCOUNTER — Inpatient Hospital Stay (HOSPITAL_BASED_OUTPATIENT_CLINIC_OR_DEPARTMENT_OTHER): Payer: Medicare HMO | Admitting: Internal Medicine

## 2023-08-15 VITALS — Resp 19

## 2023-08-15 VITALS — BP 131/71 | HR 65 | Temp 98.1°F | Resp 24 | Ht 70.0 in | Wt 262.4 lb

## 2023-08-15 DIAGNOSIS — Z79899 Other long term (current) drug therapy: Secondary | ICD-10-CM | POA: Insufficient documentation

## 2023-08-15 DIAGNOSIS — Z85828 Personal history of other malignant neoplasm of skin: Secondary | ICD-10-CM | POA: Diagnosis not present

## 2023-08-15 DIAGNOSIS — Z7901 Long term (current) use of anticoagulants: Secondary | ICD-10-CM | POA: Diagnosis not present

## 2023-08-15 DIAGNOSIS — G4730 Sleep apnea, unspecified: Secondary | ICD-10-CM | POA: Diagnosis not present

## 2023-08-15 DIAGNOSIS — I10 Essential (primary) hypertension: Secondary | ICD-10-CM | POA: Insufficient documentation

## 2023-08-15 DIAGNOSIS — D500 Iron deficiency anemia secondary to blood loss (chronic): Secondary | ICD-10-CM

## 2023-08-15 DIAGNOSIS — Z885 Allergy status to narcotic agent status: Secondary | ICD-10-CM | POA: Insufficient documentation

## 2023-08-15 DIAGNOSIS — K649 Unspecified hemorrhoids: Secondary | ICD-10-CM | POA: Insufficient documentation

## 2023-08-15 DIAGNOSIS — Z806 Family history of leukemia: Secondary | ICD-10-CM | POA: Diagnosis not present

## 2023-08-15 DIAGNOSIS — D649 Anemia, unspecified: Secondary | ICD-10-CM

## 2023-08-15 DIAGNOSIS — I4891 Unspecified atrial fibrillation: Secondary | ICD-10-CM | POA: Insufficient documentation

## 2023-08-15 DIAGNOSIS — R5383 Other fatigue: Secondary | ICD-10-CM | POA: Diagnosis not present

## 2023-08-15 DIAGNOSIS — E119 Type 2 diabetes mellitus without complications: Secondary | ICD-10-CM | POA: Diagnosis not present

## 2023-08-15 DIAGNOSIS — Z8249 Family history of ischemic heart disease and other diseases of the circulatory system: Secondary | ICD-10-CM | POA: Diagnosis not present

## 2023-08-15 DIAGNOSIS — D696 Thrombocytopenia, unspecified: Secondary | ICD-10-CM | POA: Insufficient documentation

## 2023-08-15 DIAGNOSIS — Z833 Family history of diabetes mellitus: Secondary | ICD-10-CM | POA: Insufficient documentation

## 2023-08-15 LAB — IRON AND TIBC
Iron: 69 ug/dL (ref 45–182)
Saturation Ratios: 14 % — ABNORMAL LOW (ref 17.9–39.5)
TIBC: 479 ug/dL — ABNORMAL HIGH (ref 250–450)
UIBC: 410 ug/dL

## 2023-08-15 LAB — CBC WITH DIFFERENTIAL (CANCER CENTER ONLY)
Abs Immature Granulocytes: 0.01 10*3/uL (ref 0.00–0.07)
Basophils Absolute: 0 10*3/uL (ref 0.0–0.1)
Basophils Relative: 1 %
Eosinophils Absolute: 0.1 10*3/uL (ref 0.0–0.5)
Eosinophils Relative: 2 %
HCT: 39.2 % (ref 39.0–52.0)
Hemoglobin: 12.3 g/dL — ABNORMAL LOW (ref 13.0–17.0)
Immature Granulocytes: 0 %
Lymphocytes Relative: 20 %
Lymphs Abs: 0.9 10*3/uL (ref 0.7–4.0)
MCH: 27.1 pg (ref 26.0–34.0)
MCHC: 31.4 g/dL (ref 30.0–36.0)
MCV: 86.3 fL (ref 80.0–100.0)
Monocytes Absolute: 0.4 10*3/uL (ref 0.1–1.0)
Monocytes Relative: 8 %
Neutro Abs: 3.1 10*3/uL (ref 1.7–7.7)
Neutrophils Relative %: 69 %
Platelet Count: 132 10*3/uL — ABNORMAL LOW (ref 150–400)
RBC: 4.54 MIL/uL (ref 4.22–5.81)
RDW: 17.8 % — ABNORMAL HIGH (ref 11.5–15.5)
WBC Count: 4.5 10*3/uL (ref 4.0–10.5)
nRBC: 0 % (ref 0.0–0.2)

## 2023-08-15 LAB — BASIC METABOLIC PANEL WITH GFR
Anion gap: 11 (ref 5–15)
BUN: 11 mg/dL (ref 8–23)
CO2: 19 mmol/L — ABNORMAL LOW (ref 22–32)
Calcium: 8.6 mg/dL — ABNORMAL LOW (ref 8.9–10.3)
Chloride: 105 mmol/L (ref 98–111)
Creatinine, Ser: 0.83 mg/dL (ref 0.61–1.24)
GFR, Estimated: >60 mL/min (ref >60)
Glucose, Bld: 181 mg/dL — ABNORMAL HIGH (ref 70–99)
Potassium: 3.6 mmol/L (ref 3.5–5.1)
Sodium: 135 mmol/L (ref 135–145)

## 2023-08-15 LAB — LACTATE DEHYDROGENASE: LDH: 113 U/L (ref 98–192)

## 2023-08-15 LAB — FERRITIN: Ferritin: 14 ng/mL — ABNORMAL LOW (ref 24–336)

## 2023-08-15 MED ORDER — SODIUM CHLORIDE 0.9% FLUSH
10.0000 mL | Freq: Two times a day (BID) | INTRAVENOUS | Status: DC
  Administered 2023-08-15: 10 mL via INTRAVENOUS
  Filled 2023-08-15: qty 10

## 2023-08-15 MED ORDER — IRON SUCROSE 20 MG/ML IV SOLN
200.0000 mg | Freq: Once | INTRAVENOUS | Status: AC
  Administered 2023-08-15: 200 mg via INTRAVENOUS
  Filled 2023-08-15: qty 10

## 2023-08-15 NOTE — Progress Notes (Signed)
 Patient tolerated iron infusion with no complaints voiced.  Peripheral IV site clean and dry with good blood return noted before and after infusion.  Gauze dressing applied. Pt declined to wait the 30 minute observation period. VSS with discharge and left in satisfactory condition with no s/s of distress noted. All follow ups as scheduled.   Brandon Gibson [employer]

## 2023-08-15 NOTE — Progress Notes (Signed)
 Fatigue/weakness: YES Dyspena: NO  Light headedness: YES/DIZZINESS Blood in stool: YES DUE TO HEMORRHOIDS

## 2023-08-15 NOTE — Progress Notes (Signed)
 [facility] CONSULT NOTE  Patient Care Team: Margaretann Loveless, MD as PCP - General (Internal Medicine) Earna Coder, MD as Consulting Physician (Internal Medicine)  CHIEF COMPLAINTS/PURPOSE OF CONSULTATION: ANEMIA  HEMATOLOGY HISTORY  # ANEMIA[Hb; MCV-platelets- WBC; Iron sat; ferritin;  GFR- CT/US- ;   HISTORY OF PRESENTING ILLNESS: Patient ambulating-independently. Alone.  Brandon Gibson 73 y.o.  male pleasant patient is history of diabetes and also A-fib on Xarelto-iron deficient anemia is here for follow-up.  Patient s/p iron infusions. On PO iron. Ongoing fatigue.  No falls. Patient continues to have chronic bleeding hemorrhoids.    Review of Systems  Constitutional:  Positive for malaise/fatigue. Negative for chills, diaphoresis, fever and weight loss.  HENT:  Negative for nosebleeds and sore throat.   Eyes:  Negative for double vision.  Respiratory:  Negative for cough, hemoptysis, sputum production, shortness of breath and wheezing.   Cardiovascular:  Negative for chest pain, palpitations, orthopnea and leg swelling.  Gastrointestinal:  Negative for abdominal pain, blood in stool, constipation, diarrhea, heartburn, melena, nausea and vomiting.  Genitourinary:  Negative for dysuria, frequency and urgency.  Musculoskeletal:  Negative for back pain and joint pain.  Skin: Negative.  Negative for itching and rash.  Neurological:  Negative for dizziness, tingling, focal weakness, weakness and headaches.  Endo/Heme/Allergies:  Does not bruise/bleed easily.  Psychiatric/Behavioral:  Negative for depression. The patient is not nervous/anxious and does not have insomnia.      MEDICAL HISTORY:  Past Medical History:  Diagnosis Date   Arthritis    Atrial fibrillation (HCC)    BPH (benign prostatic hyperplasia)    Cancer (HCC)    HX SKIN CANCER Basal cell   Chickenpox    Clotting disorder (HCC)    Diabetes mellitus without complication (HCC)    type  2   Dysrhythmia    IRREG HEART BEAT   GERD (gastroesophageal reflux disease)    H/O pleurisy    Hypercholesteremia    Hyperlipidemia    Hypertension    Iron deficiency anemia due to chronic blood loss 09/18/2017   Lumbar stenosis    Measles    Mumps    Sleep apnea    sleep study Dr. Park Breed, uses CPAP    SURGICAL HISTORY: Past Surgical History:  Procedure Laterality Date   ANTERIOR CERVICAL DECOMP/DISCECTOMY FUSION  11/25/2011   Procedure: ANTERIOR CERVICAL DECOMPRESSION/DISCECTOMY FUSION 2 LEVELS;  Surgeon: Karn Cassis, MD;  Location: [facility];  Service: Neurosurgery;  Laterality: N/A;  Cervical four-five,Cervical five-six  Anterior cervical decompression/diskectomy, fusion, plate   BREAST BIOPSY Right    Benign   BREAST SURGERY Left 1986   lumpectomy   CARDIAC CATHETERIZATION     2011, [facility]   CARDIOVASCULAR STRESS TEST  2011   CERVICAL FUSION  1988   C 6/7    COLONOSCOPY WITH PROPOFOL N/A 11/08/2017   Procedure: COLONOSCOPY WITH PROPOFOL;  Surgeon: Toledo, Boykin Nearing, MD;  Location: [facility];  Service: Gastroenterology;  Laterality: N/A;   ESOPHAGOGASTRODUODENOSCOPY (EGD) WITH PROPOFOL N/A 11/08/2017   Procedure: ESOPHAGOGASTRODUODENOSCOPY (EGD) WITH PROPOFOL;  Surgeon: Toledo, Boykin Nearing, MD;  Location: [facility];  Service: Gastroenterology;  Laterality: N/A;   EYE SURGERY     LASIK   JOINT REPLACEMENT     KNEE ARTHROPLASTY Right 10/05/2015   Procedure: COMPUTER ASSISTED TOTAL KNEE ARTHROPLASTY;  Surgeon: Donato Heinz, MD;  Location: [facility] ORS;  Service: Orthopedics;  Laterality: Right;   KNEE ARTHROSCOPY  1986  Right   KNEE ARTHROSCOPY Left 08/10/2015   Procedure: LEFT KNEE ARTHROSCOPY, CHONDROPLASTY, MEDIAL MENISECTOMY;  Surgeon: Arlyne Lame, MD;  Location: [facility];  Service: Orthopedics;  Laterality: Left;   LAMINECTOMY WITH POSTERIOR LATERAL ARTHRODESIS LEVEL 2 N/A 07/16/2018   Procedure: Posterior lumbar fusion with instrumentation at L3-4 with  repeat facetectomy L3-4;  Surgeon: Isadora Mar, MD;  Location: [facility] OR;  Service: Neurosurgery;  Laterality: N/A;  Posterior lumbar fusion with instrumentation at L3-4 with repeat facetectomy L3-4   LUMBAR LAMINECTOMY/DECOMPRESSION MICRODISCECTOMY N/A 10/04/2013   Procedure: LUMBAR TWO TO THREE LUMBAR LAMINECTOMY/DECOMPRESSION MICRODISCECTOMY 1 LEVEL;  Surgeon: Adelbert Adler, MD;  Location: [facility];  Service: Neurosurgery;  Laterality: N/A;  L2-3 Laminectomy   POSTERIOR LAMINECTOMY / DECOMPRESSION LUMBAR SPINE  2006   TRANSESOPHAGEAL ECHOCARDIOGRAM  2011    SOCIAL HISTORY: Social History   Socioeconomic History   Marital status: Widowed    Spouse name: Not on file   Number of children: Not on file   Years of education: Not on file   Highest education level: Not on file  Occupational History   Not on file  Tobacco Use   Smoking status: Never   Smokeless tobacco: Never  Vaping Use   Vaping status: Never Used  Substance and Sexual Activity   Alcohol use: Yes    Alcohol/week: 14.0 standard drinks of alcohol    Types: 14 Glasses of wine per week   Drug use: No   Sexual activity: Not on file  Other Topics Concern   Not on file  Social History Narrative   Not on file   Social Drivers of Health   Financial Resource Strain: Not on file  Food Insecurity: No Food Insecurity (03/16/2023)   Hunger Vital Sign    Worried About Running Out of Food in the Last Year: Never true    Ran Out of Food in the Last Year: Never true  Transportation Needs: No Transportation Needs (03/16/2023)   PRAPARE - Administrator, Civil Service (Medical): No    Lack of Transportation (Non-Medical): No  Physical Activity: Not on file  Stress: Not on file  Social Connections: Not on file  Intimate Partner Violence: Not At Risk (03/16/2023)   Humiliation, Afraid, Rape, and Kick questionnaire    Fear of Current or Ex-Partner: No    Emotionally Abused: No    Physically Abused: No     Sexually Abused: No    FAMILY HISTORY: Family History  Problem Relation Age of Onset   Leukemia Father    Diabetes Father    Heart disease Mother    Prostate cancer Neg Hx    Chronic Renal Failure Neg Hx    Breast cancer Neg Hx     ALLERGIES:  is allergic to morphine and codeine, ace inhibitors, diazepam, and tape.  MEDICATIONS:  Current Outpatient Medications  Medication Sig Dispense Refill   ACCU-CHEK AVIVA PLUS test strip TEST BLOOD SUGAR EVERY DAY 100 strip 3   amiodarone (PACERONE) 200 MG tablet TAKE 1 TABLET EVERY DAY (Patient taking differently: Take 200 mg by mouth daily. Takes 1/2 tablet daily) 90 tablet 3   apixaban (ELIQUIS) 5 MG TABS tablet Take 1 tablet (5 mg total) by mouth 2 (two) times daily. 180 tablet 1   chlorthalidone (HYGROTON) 25 MG tablet TAKE 1 TABLET EVERY DAY 90 tablet 3   docusate (COLACE) 50 MG/5ML liquid Take by mouth daily.     fluticasone (FLONASE) 50 MCG/ACT nasal  spray Place 2 sprays into both nostrils daily.     hydrALAZINE (APRESOLINE) 50 MG tablet TAKE 1 TABLET TWICE DAILY 180 tablet 3   Iron-Vitamin C 65-125 MG TABS Take 1 tablet by mouth 2 (two) times daily. 180 tablet 1   loratadine (CLARITIN) 10 MG tablet Take 1 tablet (10 mg total) by mouth daily. 90 tablet 3   metFORMIN (GLUCOPHAGE) 500 MG tablet Take 1 tablet (500 mg total) by mouth 2 (two) times daily with a meal. 2 tabs in the morning one at night 180 tablet 3   metoprolol succinate (TOPROL-XL) 50 MG 24 hr tablet TAKE 1 TABLET EVERY DAY 90 tablet 3   omeprazole (PRILOSEC) 40 MG capsule TAKE 1 CAPSULE EVERY DAY 1/2 HOUR BEFORE A MEAL 90 capsule 3   potassium chloride (KLOR-CON) 10 MEQ tablet TAKE 1 TABLET EVERY DAY 90 tablet 3   rosuvastatin (CRESTOR) 20 MG tablet TAKE 1 TABLET EVERY DAY 90 tablet 3   tamsulosin (FLOMAX) 0.4 MG CAPS capsule Take 1 capsule (0.4 mg total) by mouth daily. 90 capsule 3   trimethoprim (TRIMPEX) 100 MG tablet Take 1 tablet (100 mg total) by mouth daily. 90  tablet 3   No current facility-administered medications for this visit.     PHYSICAL EXAMINATION:   Vitals:   08/15/23 1306  BP: 131/71  Pulse: 65  Resp: (!) 24  Temp: 98.1 F (36.7 C)  SpO2: 97%   Filed Weights   08/15/23 1306  Weight: 262 lb 6.4 oz (119 kg)    Physical Exam Vitals and nursing note reviewed.  HENT:     Head: Normocephalic and atraumatic.     Mouth/Throat:     Pharynx: Oropharynx is clear.  Eyes:     Extraocular Movements: Extraocular movements intact.     Pupils: Pupils are equal, round, and reactive to light.  Cardiovascular:     Rate and Rhythm: Normal rate. Rhythm irregular.  Pulmonary:     Comments: Decreased breath sounds bilaterally.  Abdominal:     Palpations: Abdomen is soft.  Musculoskeletal:        General: Normal range of motion.     Cervical back: Normal range of motion.  Skin:    General: Skin is warm.  Neurological:     General: No focal deficit present.     Mental Status: He is alert and oriented to person, place, and time.  Psychiatric:        Behavior: Behavior normal.        Judgment: Judgment normal.      LABORATORY DATA:  I have reviewed the data as listed Lab Results  Component Value Date   WBC 4.5 08/15/2023   HGB 12.3 (L) 08/15/2023   HCT 39.2 08/15/2023   MCV 86.3 08/15/2023   PLT 132 (L) 08/15/2023   Recent Labs    10/25/22 1137 02/01/23 0915 02/14/23 0957 05/17/23 0938 08/07/23 0915 08/15/23 1258  NA 138 142   < > 137 143 135  K 3.5 3.9   < > 3.5 3.8 3.6  CL 100 103   < > 100 110* 105  CO2 25 22   < > 24 22 19*  GLUCOSE 115* 146*   < > 170* 116* 181*  BUN 9 11   < > 12 8 11   CREATININE 1.01 1.02   < > 1.14 0.90 0.83  CALCIUM 8.9 8.6   < > 9.1 8.6 8.6*  GFRNONAA  --   --   --  >  60  --  >60  PROT 5.9* 5.5*  --   --  5.7*  --   ALBUMIN 4.1 4.1  --   --  3.9  --   AST 38 39  --   --  61*  --   ALT 35 36  --   --  45*  --   ALKPHOS 64 58  --   --  69  --   BILITOT 0.4 0.3  --   --  0.2  --    <  > = values in this interval not displayed.     No results found.  ASSESSMENT & PLAN:   Symptomatic anemia # Iron deficiency anemia- Hb 8 [PCP-NOV 2024-ferritin-12; I sat-10]-symptomatic. Etiology GI blood loss-?  Hemorrhoidal -/patient on Xarelto. Continue Vitron C  # s/p venofer -  Hb 12.3- proceed with venofer today.   # Etiology of iron deficiency: Unclear;-  s/p previous d GI evaluation-EGD colonoscopy; Bobbi Burow study [2019]- [facility]- ?  Defer to PCP regarding referral to surgery for hemorrhoidal bleeding  # Mild intermittent thrombocytopenia platelets greater than 100 stable.  Monitor  # A.fib on xarelo [Dr.Khan]-monitor closely for anemia- stable  IV access:  # DISPOSITION: # venofer  today # venofer 1 month  # follow up 4  month- MD; labs- cbc/bmp;LDH; iron studies; ferritin- possible venofer- Dr.B    All questions were answered. The patient knows to call the clinic with any problems, questions or concerns.    Gwyn Leos, MD 08/15/2023 2:09 PM

## 2023-08-15 NOTE — Assessment & Plan Note (Addendum)
#   Iron deficiency anemia- Hb 8 [PCP-NOV 2024-ferritin-12; I sat-10]-symptomatic. Etiology GI blood loss-?  Hemorrhoidal -/patient on Xarelto. Continue Vitron C  # s/p venofer -  Hb 12.3- proceed with venofer today.   # Etiology of iron deficiency: Unclear;-  s/p previous d GI evaluation-EGD colonoscopy; Bobbi Burow study [2019]- [facility]- ?  Defer to PCP regarding referral to surgery for hemorrhoidal bleeding  # Mild intermittent thrombocytopenia platelets greater than 100 stable.  Monitor  # A.fib on xarelo [Dr.Khan]-monitor closely for anemia- stable  IV access:  # DISPOSITION: # venofer  today # venofer 1 month  # follow up 4  month- MD; labs- cbc/bmp;LDH; iron studies; ferritin- possible venofer- Dr.B

## 2023-08-16 ENCOUNTER — Other Ambulatory Visit: Payer: Self-pay | Admitting: Family

## 2023-08-16 MED ORDER — MELOXICAM 7.5 MG PO TABS: 7.5000 mg | ORAL_TABLET | Freq: Two times a day (BID) | ORAL | 0 refills | Status: DC | PRN

## 2023-08-17 ENCOUNTER — Ambulatory Visit (INDEPENDENT_AMBULATORY_CARE_PROVIDER_SITE_OTHER): Admitting: Internal Medicine

## 2023-08-17 ENCOUNTER — Encounter: Payer: Self-pay | Admitting: Internal Medicine

## 2023-08-17 VITALS — BP 130/76 | HR 75 | Ht 70.0 in | Wt 256.8 lb

## 2023-08-17 DIAGNOSIS — I152 Hypertension secondary to endocrine disorders: Secondary | ICD-10-CM | POA: Diagnosis not present

## 2023-08-17 DIAGNOSIS — E782 Mixed hyperlipidemia: Secondary | ICD-10-CM | POA: Diagnosis not present

## 2023-08-17 DIAGNOSIS — E1169 Type 2 diabetes mellitus with other specified complication: Secondary | ICD-10-CM | POA: Diagnosis not present

## 2023-08-17 DIAGNOSIS — M5417 Radiculopathy, lumbosacral region: Secondary | ICD-10-CM

## 2023-08-17 DIAGNOSIS — K219 Gastro-esophageal reflux disease without esophagitis: Secondary | ICD-10-CM

## 2023-08-17 DIAGNOSIS — G4733 Obstructive sleep apnea (adult) (pediatric): Secondary | ICD-10-CM

## 2023-08-17 DIAGNOSIS — E1159 Type 2 diabetes mellitus with other circulatory complications: Secondary | ICD-10-CM

## 2023-08-17 DIAGNOSIS — E119 Type 2 diabetes mellitus without complications: Secondary | ICD-10-CM

## 2023-08-17 LAB — POCT CBG (FASTING - GLUCOSE)-MANUAL ENTRY: Glucose Fasting, POC: 133 mg/dL — AB (ref 70–99)

## 2023-08-17 MED ORDER — PREDNISONE 20 MG PO TABS: 40.0000 mg | ORAL_TABLET | Freq: Every day | ORAL | 0 refills | Status: DC

## 2023-08-17 MED ORDER — BACLOFEN 10 MG PO TABS: 10.0000 mg | ORAL_TABLET | Freq: Two times a day (BID) | ORAL | 1 refills | Status: AC

## 2023-08-17 NOTE — Progress Notes (Signed)
 Established Patient Office Visit  Subjective:  Patient ID: Brandon Gibson, male    DOB: 1950/12/21  Age: 73 y.o. MRN: [id number]  Chief Complaint  Patient presents with   Back Pain    Patient comes in with complaints of lower back pain for last few days.  He reports of lifting luggage but it was several weeks ago.  The pain started suddenly and it is radiating down his left leg.  He has chronic neuropathy of both feet but it did not get worse.  Patient has been under the care of neurosurgery for spinal stenosis.  He was started on p.o. meloxicam yesterday but it is not helping.  Will send in a prescription for prednisone burst along with baclofen.    No other concerns at this time.   Past Medical History:  Diagnosis Date   Arthritis    Atrial fibrillation (HCC)    BPH (benign prostatic hyperplasia)    Cancer (HCC)    HX SKIN CANCER Basal cell   Chickenpox    Clotting disorder (HCC)    Diabetes mellitus without complication (HCC)    type 2   Dysrhythmia    IRREG HEART BEAT   GERD (gastroesophageal reflux disease)    H/O pleurisy    Hypercholesteremia    Hyperlipidemia    Hypertension    Iron deficiency anemia due to chronic blood loss 09/18/2017   Lumbar stenosis    Measles    Mumps    Sleep apnea    sleep study Dr. Park Breed, uses CPAP    Past Surgical History:  Procedure Laterality Date   ANTERIOR CERVICAL DECOMP/DISCECTOMY FUSION  11/25/2011   Procedure: ANTERIOR CERVICAL DECOMPRESSION/DISCECTOMY FUSION 2 LEVELS;  Surgeon: Karn Cassis, MD;  Location: [facility];  Service: Neurosurgery;  Laterality: N/A;  Cervical four-five,Cervical five-six  Anterior cervical decompression/diskectomy, fusion, plate   BREAST BIOPSY Right    Benign   BREAST SURGERY Left 1986   lumpectomy   CARDIAC CATHETERIZATION     2011, [facility]   CARDIOVASCULAR STRESS TEST  2011   CERVICAL FUSION  1988   C 6/7    COLONOSCOPY WITH PROPOFOL N/A 11/08/2017   Procedure: COLONOSCOPY WITH  PROPOFOL;  Surgeon: Toledo, Boykin Nearing, MD;  Location: [facility];  Service: Gastroenterology;  Laterality: N/A;   ESOPHAGOGASTRODUODENOSCOPY (EGD) WITH PROPOFOL N/A 11/08/2017   Procedure: ESOPHAGOGASTRODUODENOSCOPY (EGD) WITH PROPOFOL;  Surgeon: Toledo, Boykin Nearing, MD;  Location: [facility];  Service: Gastroenterology;  Laterality: N/A;   EYE SURGERY     LASIK   JOINT REPLACEMENT     KNEE ARTHROPLASTY Right 10/05/2015   Procedure: COMPUTER ASSISTED TOTAL KNEE ARTHROPLASTY;  Surgeon: Donato Heinz, MD;  Location: [facility];  Service: Orthopedics;  Laterality: Right;   KNEE ARTHROSCOPY  1986   Right   KNEE ARTHROSCOPY Left 08/10/2015   Procedure: LEFT KNEE ARTHROSCOPY, CHONDROPLASTY, MEDIAL MENISECTOMY;  Surgeon: Donato Heinz, MD;  Location: [facility];  Service: Orthopedics;  Laterality: Left;   LAMINECTOMY WITH POSTERIOR LATERAL ARTHRODESIS LEVEL 2 N/A 07/16/2018   Procedure: Posterior lumbar fusion with instrumentation at L3-4 with repeat facetectomy L3-4;  Surgeon: Tia Alert, MD;  Location: [facility];  Service: Neurosurgery;  Laterality: N/A;  Posterior lumbar fusion with instrumentation at L3-4 with repeat facetectomy L3-4   LUMBAR LAMINECTOMY/DECOMPRESSION MICRODISCECTOMY N/A 10/04/2013   Procedure: LUMBAR TWO TO THREE LUMBAR LAMINECTOMY/DECOMPRESSION MICRODISCECTOMY 1 LEVEL;  Surgeon: Karn Cassis, MD;  Location: [facility];  Service: Neurosurgery;  Laterality: N/A;  L2-3 Laminectomy   POSTERIOR LAMINECTOMY / DECOMPRESSION LUMBAR SPINE  2006   TRANSESOPHAGEAL ECHOCARDIOGRAM  2011    Social History   Socioeconomic History   Marital status: Widowed    Spouse name: Not on file   Number of children: Not on file   Years of education: Not on file   Highest education level: Not on file  Occupational History   Not on file  Tobacco Use   Smoking status: Never   Smokeless tobacco: Never  Vaping Use   Vaping status: Never Used  Substance and Sexual Activity   Alcohol use:  Yes    Alcohol/week: 14.0 standard drinks of alcohol    Types: 14 Glasses of wine per week   Drug use: No   Sexual activity: Not on file  Other Topics Concern   Not on file  Social History Narrative   Not on file   Social Drivers of Health   Financial Resource Strain: Not on file  Food Insecurity: No Food Insecurity (03/16/2023)   Hunger Vital Sign    Worried About Running Out of Food in the Last Year: Never true    Ran Out of Food in the Last Year: Never true  Transportation Needs: No Transportation Needs (03/16/2023)   PRAPARE - Administrator, Civil Service (Medical): No    Lack of Transportation (Non-Medical): No  Physical Activity: Not on file  Stress: Not on file  Social Connections: Not on file  Intimate Partner Violence: Not At Risk (03/16/2023)   Humiliation, Afraid, Rape, and Kick questionnaire    Fear of Current or Ex-Partner: No    Emotionally Abused: No    Physically Abused: No    Sexually Abused: No    Family History  Problem Relation Age of Onset   Leukemia Father    Diabetes Father    Heart disease Mother    Prostate cancer Neg Hx    Chronic Renal Failure Neg Hx    Breast cancer Neg Hx     Allergies  Allergen Reactions   Morphine And Codeine Anaphylaxis   Ace Inhibitors Swelling    Other reaction(s): Unknown   Diazepam    Tape     Outpatient Medications Prior to Visit  Medication Sig   ACCU-CHEK AVIVA PLUS test strip TEST BLOOD SUGAR EVERY DAY   amiodarone (PACERONE) 200 MG tablet TAKE 1 TABLET EVERY DAY (Patient taking differently: Take 200 mg by mouth daily. Takes 1/2 tablet daily)   apixaban (ELIQUIS) 5 MG TABS tablet Take 1 tablet (5 mg total) by mouth 2 (two) times daily.   chlorthalidone (HYGROTON) 25 MG tablet TAKE 1 TABLET EVERY DAY   docusate (COLACE) 50 MG/5ML liquid Take by mouth daily.   fluticasone (FLONASE) 50 MCG/ACT nasal spray Place 2 sprays into both nostrils daily.   hydrALAZINE (APRESOLINE) 50 MG tablet TAKE 1  TABLET TWICE DAILY   Iron-Vitamin C 65-125 MG TABS Take 1 tablet by mouth 2 (two) times daily.   loratadine (CLARITIN) 10 MG tablet Take 1 tablet (10 mg total) by mouth daily.   meloxicam (MOBIC) 7.5 MG tablet Take 1 tablet (7.5 mg total) by mouth 2 (two) times daily as needed for pain.   metFORMIN (GLUCOPHAGE) 500 MG tablet Take 1 tablet (500 mg total) by mouth 2 (two) times daily with a meal. 2 tabs in the morning one at night   metoprolol succinate (TOPROL-XL) 50 MG 24 hr tablet TAKE 1 TABLET EVERY DAY  omeprazole (PRILOSEC) 40 MG capsule TAKE 1 CAPSULE EVERY DAY 1/2 HOUR BEFORE A MEAL   potassium chloride (KLOR-CON) 10 MEQ tablet TAKE 1 TABLET EVERY DAY   rosuvastatin (CRESTOR) 20 MG tablet TAKE 1 TABLET EVERY DAY   tamsulosin (FLOMAX) 0.4 MG CAPS capsule Take 1 capsule (0.4 mg total) by mouth daily.   trimethoprim (TRIMPEX) 100 MG tablet Take 1 tablet (100 mg total) by mouth daily.   No facility-administered medications prior to visit.    Review of Systems  Constitutional: Negative.  Negative for chills, fever and weight loss.  HENT: Negative.  Negative for congestion and sore throat.   Eyes: Negative.   Respiratory: Negative.  Negative for cough and shortness of breath.   Cardiovascular: Negative.  Negative for chest pain, palpitations and leg swelling.  Gastrointestinal: Negative.  Negative for abdominal pain, constipation, diarrhea, heartburn, nausea and vomiting.  Genitourinary: Negative.  Negative for dysuria and flank pain.  Musculoskeletal:  Positive for back pain. Negative for joint pain and myalgias.  Skin: Negative.   Neurological: Negative.  Negative for dizziness, sensory change, speech change and headaches.  Endo/Heme/Allergies: Negative.   Psychiatric/Behavioral: Negative.  Negative for depression and suicidal ideas. The patient is not nervous/anxious.        Objective:   BP 130/76   Pulse 75   Ht 5\' 10"  (1.778 m)   Wt 256 lb 12.8 oz (116.5 kg)   SpO2 95%    BMI 36.85 kg/m   Vitals:   08/17/23 0928  BP: 130/76  Pulse: 75  Height: 5\' 10"  (1.778 m)  Weight: 256 lb 12.8 oz (116.5 kg)  SpO2: 95%  BMI (Calculated): 36.85    Physical Exam Vitals and nursing note reviewed.  Constitutional:      Appearance: Normal appearance.  HENT:     Head: Normocephalic and atraumatic.     Nose: Nose normal.     Mouth/Throat:     Mouth: Mucous membranes are moist.     Pharynx: Oropharynx is clear.  Eyes:     Conjunctiva/sclera: Conjunctivae normal.     Pupils: Pupils are equal, round, and reactive to light.  Cardiovascular:     Rate and Rhythm: Normal rate and regular rhythm.     Pulses: Normal pulses.     Heart sounds: Normal heart sounds.  Pulmonary:     Effort: Pulmonary effort is normal.     Breath sounds: Normal breath sounds.  Abdominal:     General: Bowel sounds are normal.     Palpations: Abdomen is soft.  Musculoskeletal:        General: Normal range of motion.     Cervical back: Normal range of motion.  Skin:    General: Skin is warm and dry.  Neurological:     General: No focal deficit present.     Mental Status: He is alert and oriented to person, place, and time.  Psychiatric:        Mood and Affect: Mood normal.        Behavior: Behavior normal.        Judgment: Judgment normal.      Results for orders placed or performed in visit on 08/17/23  POCT CBG (Fasting - Glucose)  Result Value Ref Range   Glucose Fasting, POC 133 (A) 70 - 99 mg/dL    Recent Results (from the past 2160 hours)  Hemoglobin A1c     Status: Abnormal   Collection Time: 08/07/23  9:15 AM  Result Value Ref  Range   Hgb A1c MFr Bld 6.6 (H) 4.8 - 5.6 %    Comment:          Prediabetes: 5.7 - 6.4          Diabetes: >6.4          Glycemic control for adults with diabetes: <7.0    Est. average glucose Bld gHb Est-mCnc 143 mg/dL  TSH     Status: None   Collection Time: 08/07/23  9:15 AM  Result Value Ref Range   TSH 2.020 0.450 - 4.500 uIU/mL   CMP14+EGFR     Status: Abnormal   Collection Time: 08/07/23  9:15 AM  Result Value Ref Range   Glucose 116 (H) 70 - 99 mg/dL   BUN 8 8 - 27 mg/dL   Creatinine, Ser 7.82 0.76 - 1.27 mg/dL   eGFR 89 >95 AO/ZHY/8.65   BUN/Creatinine Ratio 9 (L) 10 - 24   Sodium 143 134 - 144 mmol/L   Potassium 3.8 3.5 - 5.2 mmol/L   Chloride 110 (H) 96 - 106 mmol/L   CO2 22 20 - 29 mmol/L   Calcium 8.6 8.6 - 10.2 mg/dL   Total Protein 5.7 (L) 6.0 - 8.5 g/dL   Albumin 3.9 3.8 - 4.8 g/dL   Globulin, Total 1.8 1.5 - 4.5 g/dL   Bilirubin Total 0.2 0.0 - 1.2 mg/dL   Alkaline Phosphatase 69 44 - 121 IU/L   AST 61 (H) 0 - 40 IU/L   ALT 45 (H) 0 - 44 IU/L  Lipid panel     Status: Abnormal   Collection Time: 08/07/23  9:15 AM  Result Value Ref Range   Cholesterol, Total 119 100 - 199 mg/dL   Triglycerides 784 (H) 0 - 149 mg/dL   HDL 58 >69 mg/dL   VLDL Cholesterol Cal 26 5 - 40 mg/dL   LDL Chol Calc (NIH) 35 0 - 99 mg/dL   Chol/HDL Ratio 2.1 0.0 - 5.0 ratio    Comment:                                   T. Chol/HDL Ratio                                             Men  Women                               1/2 Avg.Risk  3.4    3.3                                   Avg.Risk  5.0    4.4                                2X Avg.Risk  9.6    7.1                                3X Avg.Risk 23.4   11.0   POCT CBG (Fasting - Glucose)     Status: Abnormal   Collection Time:  08/08/23  9:09 AM  Result Value Ref Range   Glucose Fasting, POC 149 (A) 70 - 99 mg/dL  Urinalysis, Complete     Status: Abnormal   Collection Time: 08/14/23 10:31 AM  Result Value Ref Range   Specific Gravity, UA 1.025 1.005 - 1.030   pH, UA 5.5 5.0 - 7.5   Color, UA Yellow Yellow   Appearance Ur Clear Clear   Leukocytes,UA Negative Negative   Protein,UA 2+ (A) Negative/Trace   Glucose, UA Negative Negative   Ketones, UA Trace (A) Negative   RBC, UA Negative Negative   Bilirubin, UA Negative Negative   Urobilinogen, Ur 0.2 0.2 - 1.0  mg/dL   Nitrite, UA Negative Negative   Microscopic Examination See below:   Microscopic Examination     Status: Abnormal   Collection Time: 08/14/23 10:31 AM   Urine  Result Value Ref Range   WBC, UA 0-5 0 - 5 /hpf   RBC, Urine 0-2 0 - 2 /hpf   Epithelial Cells (non renal) 0-10 0 - 10 /hpf   Mucus, UA Present (A) Not Estab.   Bacteria, UA Few None seen/Few  Ferritin     Status: Abnormal   Collection Time: 08/15/23 12:58 PM  Result Value Ref Range   Ferritin 14 (L) 24 - 336 ng/mL    Comment: Performed at [facility], [address] [place], [place] [address]  Iron and TIBC     Status: Abnormal   Collection Time: 08/15/23 12:58 PM  Result Value Ref Range   Iron 69 45 - 182 ug/dL   TIBC 952 (H) 841 - 324 ug/dL   Saturation Ratios 14 (L) 17.9 - 39.5 %   UIBC 410 ug/dL    Comment: Performed at [facility], [address] [place], [place] [address]  Lactate dehydrogenase     Status: None   Collection Time: 08/15/23 12:58 PM  Result Value Ref Range   LDH 113 98 - 192 U/L    Comment: Performed at [facility], [address] [place], [place] [address]  Basic metabolic panel     Status: Abnormal   Collection Time: 08/15/23 12:58 PM  Result Value Ref Range   Sodium 135 135 - 145 mmol/L   Potassium 3.6 3.5 - 5.1 mmol/L   Chloride 105 98 - 111 mmol/L   CO2 19 (L) 22 - 32 mmol/L   Glucose, Bld 181 (H) 70 - 99 mg/dL    Comment: Glucose reference range applies only to samples taken after fasting for at least 8 hours.   BUN 11 8 - 23 mg/dL   Creatinine, Ser 6.44 0.61 - 1.24 mg/dL   Calcium 8.6 (L) 8.9 - 10.3 mg/dL   GFR, Estimated >03 >47 mL/min    Comment: (NOTE) Calculated using the CKD-EPI Creatinine Equation (2021)    Anion gap 11 5 - 15    Comment: Performed at [facility], [address] [place], [place] [address]  CBC with Differential (Cancer Center Only)     Status: Abnormal   Collection Time: 08/15/23 12:58 PM  Result Value Ref  Range   WBC Count 4.5 4.0 - 10.5 K/uL   RBC 4.54 4.22 - 5.81 MIL/uL   Hemoglobin 12.3 (L) 13.0 - 17.0 g/dL   HCT 63.8 75.6 - 43.3 %   MCV 86.3 80.0 - 100.0 fL   MCH 27.1 26.0 - 34.0 pg   MCHC 31.4 30.0 - 36.0 g/dL   RDW 29.5 (H)  11.5 - 15.5 %   Platelet Count 132 (L) 150 - 400 K/uL   nRBC 0.0 0.0 - 0.2 %   Neutrophils Relative % 69 %   Neutro Abs 3.1 1.7 - 7.7 K/uL   Lymphocytes Relative 20 %   Lymphs Abs 0.9 0.7 - 4.0 K/uL   Monocytes Relative 8 %   Monocytes Absolute 0.4 0.1 - 1.0 K/uL   Eosinophils Relative 2 %   Eosinophils Absolute 0.1 0.0 - 0.5 K/uL   Basophils Relative 1 %   Basophils Absolute 0.0 0.0 - 0.1 K/uL   Immature Granulocytes 0 %   Abs Immature Granulocytes 0.01 0.00 - 0.07 K/uL    Comment: Performed at [facility], [address] [place], [place] [address]  POCT CBG (Fasting - Glucose)     Status: Abnormal   Collection Time: 08/17/23  9:32 AM  Result Value Ref Range   Glucose Fasting, POC 133 (A) 70 - 99 mg/dL      Assessment & Plan:  Start treatment with prednisone burst, muscle relaxer, rest.  Will consider physical therapy at follow-up.  May also need neurosurgical follow-up if not better. Problem List Items Addressed This Visit     Diabetes mellitus type 2, uncomplicated (HCC)   Relevant Orders   POCT CBG (Fasting - Glucose) (Completed)   GERD (gastroesophageal reflux disease)   Obstructive sleep apnea   Hypertension associated with diabetes (HCC)   Combined hyperlipidemia associated with type 2 diabetes mellitus (HCC)   Other Visit Diagnoses       Lumbosacral radiculopathy    -  Primary   Relevant Medications   predniSONE (DELTASONE) 20 MG tablet   baclofen (LIORESAL) 10 MG tablet       Return in about 1 week (around 08/24/2023).   Total time spent: 25 minutes  Aisha Hove, MD  08/17/2023   This document may have been prepared by [facility] Voice Recognition software and as such may include unintentional dictation errors.

## 2023-08-21 ENCOUNTER — Other Ambulatory Visit: Payer: Self-pay | Admitting: Cardiovascular Disease

## 2023-08-21 DIAGNOSIS — I480 Paroxysmal atrial fibrillation: Secondary | ICD-10-CM

## 2023-08-21 DIAGNOSIS — I10 Essential (primary) hypertension: Secondary | ICD-10-CM

## 2023-08-21 DIAGNOSIS — K219 Gastro-esophageal reflux disease without esophagitis: Secondary | ICD-10-CM

## 2023-08-21 DIAGNOSIS — G4733 Obstructive sleep apnea (adult) (pediatric): Secondary | ICD-10-CM

## 2023-08-24 ENCOUNTER — Ambulatory Visit (INDEPENDENT_AMBULATORY_CARE_PROVIDER_SITE_OTHER): Admitting: Internal Medicine

## 2023-08-24 ENCOUNTER — Encounter: Payer: Self-pay | Admitting: Internal Medicine

## 2023-08-24 VITALS — BP 130/80 | HR 58 | Ht 70.0 in | Wt 259.0 lb

## 2023-08-24 DIAGNOSIS — E119 Type 2 diabetes mellitus without complications: Secondary | ICD-10-CM

## 2023-08-24 LAB — POCT CBG (FASTING - GLUCOSE)-MANUAL ENTRY: Glucose Fasting, POC: 165 mg/dL — AB (ref 70–99)

## 2023-08-24 NOTE — Progress Notes (Signed)
 Established Patient Office Visit  Subjective:  Patient ID: Brandon Gibson, male    DOB: [birth year]  Age: 73 y.o. MRN: [id number]  Chief Complaint  Patient presents with   Follow-up    1 week follow up    Patient comes in for follow-up of his lower back pain.  He has history of lumbar radiculopathy and chronic back pain but he had a flareup recently and his pain got worse.  He has completed his prednisone  burst and is is taking baclofen  regularly.  He is feeling much better today and his symptoms have resolved completely.  Patient advised to continue taking baclofen  as needed and to avoid any movements or activities which can aggravate his lower back.    No other concerns at this time.   Past Medical History:  Diagnosis Date   Arthritis    Atrial fibrillation (HCC)    BPH (benign prostatic hyperplasia)    Cancer (HCC)    HX SKIN CANCER Basal cell   Chickenpox    Clotting disorder (HCC)    Diabetes mellitus without complication (HCC)    type 2   Dysrhythmia    IRREG HEART BEAT   GERD (gastroesophageal reflux disease)    H/O pleurisy    Hypercholesteremia    Hyperlipidemia    Hypertension    Iron  deficiency anemia due to chronic blood loss 09/18/2017   Lumbar stenosis    Measles    Mumps    Sleep apnea    sleep study Dr. Phyllis Breeze, uses CPAP    Past Surgical History:  Procedure Laterality Date   ANTERIOR CERVICAL DECOMP/DISCECTOMY FUSION  11/25/2011   Procedure: ANTERIOR CERVICAL DECOMPRESSION/DISCECTOMY FUSION 2 LEVELS;  Surgeon: Adelbert Adler, MD;  Location: [facility];  Service: Neurosurgery;  Laterality: N/A;  Cervical four-five,Cervical five-six  Anterior cervical decompression/diskectomy, fusion, plate   BREAST BIOPSY Right    Benign   BREAST SURGERY Left 1986   lumpectomy   CARDIAC CATHETERIZATION     2011, [facility]   CARDIOVASCULAR STRESS TEST  2011   CERVICAL FUSION  1988   C 6/7    COLONOSCOPY WITH PROPOFOL  N/A 11/08/2017   Procedure: COLONOSCOPY WITH  PROPOFOL ;  Surgeon: Toledo, Alphonsus Jeans, MD;  Location: [facility];  Service: Gastroenterology;  Laterality: N/A;   ESOPHAGOGASTRODUODENOSCOPY (EGD) WITH PROPOFOL  N/A 11/08/2017   Procedure: ESOPHAGOGASTRODUODENOSCOPY (EGD) WITH PROPOFOL ;  Surgeon: Toledo, Alphonsus Jeans, MD;  Location: [facility];  Service: Gastroenterology;  Laterality: N/A;   EYE SURGERY     LASIK   JOINT REPLACEMENT     KNEE ARTHROPLASTY Right 10/05/2015   Procedure: COMPUTER ASSISTED TOTAL KNEE ARTHROPLASTY;  Surgeon: Arlyne Lame, MD;  Location: [facility];  Service: Orthopedics;  Laterality: Right;   KNEE ARTHROSCOPY  1986   Right   KNEE ARTHROSCOPY Left 08/10/2015   Procedure: LEFT KNEE ARTHROSCOPY, CHONDROPLASTY, MEDIAL MENISECTOMY;  Surgeon: Arlyne Lame, MD;  Location: [facility];  Service: Orthopedics;  Laterality: Left;   LAMINECTOMY WITH POSTERIOR LATERAL ARTHRODESIS LEVEL 2 N/A 07/16/2018   Procedure: Posterior lumbar fusion with instrumentation at L3-4 with repeat facetectomy L3-4;  Surgeon: Isadora Mar, MD;  Location: [facility];  Service: Neurosurgery;  Laterality: N/A;  Posterior lumbar fusion with instrumentation at L3-4 with repeat facetectomy L3-4   LUMBAR LAMINECTOMY/DECOMPRESSION MICRODISCECTOMY N/A 10/04/2013   Procedure: LUMBAR TWO TO THREE LUMBAR LAMINECTOMY/DECOMPRESSION MICRODISCECTOMY 1 LEVEL;  Surgeon: Adelbert Adler, MD;  Location: [facility];  Service: Neurosurgery;  Laterality: N/A;  L2-3 Laminectomy   POSTERIOR LAMINECTOMY / DECOMPRESSION LUMBAR SPINE  2006   TRANSESOPHAGEAL ECHOCARDIOGRAM  2011    Social History   Socioeconomic History   Marital status: Widowed    Spouse name: Not on file   Number of children: Not on file   Years of education: Not on file   Highest education level: Not on file  Occupational History   Not on file  Tobacco Use   Smoking status: Never   Smokeless tobacco: Never  Vaping Use   Vaping status: Never Used  Substance and Sexual Activity   Alcohol  use:  Yes    Alcohol /week: 14.0 standard drinks of alcohol     Types: 14 Glasses of wine per week   Drug use: No   Sexual activity: Not on file  Other Topics Concern   Not on file  Social History Narrative   Not on file   Social Drivers of Health   Financial Resource Strain: Not on file  Food Insecurity: No Food Insecurity (03/16/2023)   Hunger Vital Sign    Worried About Running Out of Food in the Last Year: Never true    Ran Out of Food in the Last Year: Never true  Transportation Needs: No Transportation Needs (03/16/2023)   PRAPARE - Administrator, Civil Service (Medical): No    Lack of Transportation (Non-Medical): No  Physical Activity: Not on file  Stress: Not on file  Social Connections: Not on file  Intimate Partner Violence: Not At Risk (03/16/2023)   Humiliation, Afraid, Rape, and Kick questionnaire    Fear of Current or Ex-Partner: No    Emotionally Abused: No    Physically Abused: No    Sexually Abused: No    Family History  Problem Relation Age of Onset   Leukemia Father    Diabetes Father    Heart disease Mother    Prostate cancer Neg Hx    Chronic Renal Failure Neg Hx    Breast cancer Neg Hx     Allergies  Allergen Reactions   Morphine  And Codeine Anaphylaxis   Ace Inhibitors Swelling    Other reaction(s): Unknown   Diazepam     Tape     Outpatient Medications Prior to Visit  Medication Sig   ACCU-CHEK AVIVA PLUS test strip TEST BLOOD SUGAR EVERY DAY   amiodarone  (PACERONE ) 200 MG tablet TAKE 1 TABLET EVERY DAY (Patient taking differently: Take 100 mg by mouth daily.)   baclofen  (LIORESAL ) 10 MG tablet Take 1 tablet (10 mg total) by mouth 2 (two) times daily.   chlorthalidone (HYGROTON) 25 MG tablet TAKE 1 TABLET EVERY DAY   docusate (COLACE) 50 MG/5ML liquid Take by mouth daily.   ELIQUIS  5 MG TABS tablet TAKE 1 TABLET TWICE DAILY   fluticasone (FLONASE) 50 MCG/ACT nasal spray Place 2 sprays into both nostrils daily.   hydrALAZINE   (APRESOLINE ) 50 MG tablet TAKE 1 TABLET TWICE DAILY   Iron -Vitamin C  65-125 MG TABS Take 1 tablet by mouth 2 (two) times daily.   loratadine  (CLARITIN ) 10 MG tablet Take 1 tablet (10 mg total) by mouth daily.   metFORMIN  (GLUCOPHAGE ) 500 MG tablet Take 1 tablet (500 mg total) by mouth 2 (two) times daily with a meal. 2 tabs in the morning one at night   metoprolol  succinate (TOPROL -XL) 50 MG 24 hr tablet TAKE 1 TABLET EVERY DAY   omeprazole (PRILOSEC) 40 MG capsule TAKE 1 CAPSULE EVERY DAY 1/2 HOUR BEFORE A MEAL   potassium  chloride (KLOR-CON ) 10 MEQ tablet TAKE 1 TABLET EVERY DAY   rosuvastatin  (CRESTOR ) 20 MG tablet TAKE 1 TABLET EVERY DAY   tamsulosin  (FLOMAX ) 0.4 MG CAPS capsule Take 1 capsule (0.4 mg total) by mouth daily.   trimethoprim  (TRIMPEX ) 100 MG tablet Take 1 tablet (100 mg total) by mouth daily.   meloxicam  (MOBIC ) 7.5 MG tablet Take 1 tablet (7.5 mg total) by mouth 2 (two) times daily as needed for pain. (Patient not taking: Reported on 08/24/2023)   predniSONE  (DELTASONE ) 20 MG tablet Take 2 tablets (40 mg total) by mouth daily with breakfast. (Patient not taking: Reported on 08/24/2023)   No facility-administered medications prior to visit.    Review of Systems  Constitutional: Negative.  Negative for chills, malaise/fatigue and weight loss.  HENT: Negative.  Negative for sore throat.   Eyes: Negative.   Respiratory: Negative.  Negative for cough and shortness of breath.   Cardiovascular: Negative.  Negative for chest pain, palpitations and leg swelling.  Gastrointestinal: Negative.  Negative for abdominal pain, constipation, diarrhea, heartburn, nausea and vomiting.  Genitourinary: Negative.  Negative for dysuria and flank pain.  Musculoskeletal: Negative.  Negative for joint pain and myalgias.  Skin: Negative.   Neurological: Negative.  Negative for dizziness, tingling, tremors, sensory change and headaches.  Endo/Heme/Allergies: Negative.   Psychiatric/Behavioral:  Negative.  Negative for depression and suicidal ideas. The patient is not nervous/anxious.        Objective:   BP 130/80   Pulse (!) 58   Ht 5\' 10"  (1.778 m)   Wt 259 lb (117.5 kg)   SpO2 98%   BMI 37.16 kg/m   Vitals:   08/24/23 0919  BP: 130/80  Pulse: (!) 58  Height: 5\' 10"  (1.778 m)  Weight: 259 lb (117.5 kg)  SpO2: 98%  BMI (Calculated): 37.16    Physical Exam Vitals and nursing note reviewed.  Constitutional:      Appearance: Normal appearance.  HENT:     Head: Normocephalic and atraumatic.     Nose: Nose normal.     Mouth/Throat:     Mouth: Mucous membranes are moist.     Pharynx: Oropharynx is clear.  Eyes:     Conjunctiva/sclera: Conjunctivae normal.     Pupils: Pupils are equal, round, and reactive to light.  Cardiovascular:     Rate and Rhythm: Normal rate and regular rhythm.     Pulses: Normal pulses.     Heart sounds: Normal heart sounds.  Pulmonary:     Effort: Pulmonary effort is normal.     Breath sounds: Normal breath sounds.  Abdominal:     General: Bowel sounds are normal.     Palpations: Abdomen is soft.  Musculoskeletal:        General: Normal range of motion.     Cervical back: Normal range of motion.  Skin:    General: Skin is warm and dry.  Neurological:     General: No focal deficit present.     Mental Status: He is alert and oriented to person, place, and time.  Psychiatric:        Mood and Affect: Mood normal.        Behavior: Behavior normal.        Judgment: Judgment normal.      Results for orders placed or performed in visit on 08/24/23  POCT CBG (Fasting - Glucose)  Result Value Ref Range   Glucose Fasting, POC 165 (A) 70 - 99 mg/dL  Recent Results (from the past 2160 hours)  Hemoglobin A1c     Status: Abnormal   Collection Time: 08/07/23  9:15 AM  Result Value Ref Range   Hgb A1c MFr Bld 6.6 (H) 4.8 - 5.6 %    Comment:          Prediabetes: 5.7 - 6.4          Diabetes: >6.4          Glycemic control for  adults with diabetes: <7.0    Est. average glucose Bld gHb Est-mCnc 143 mg/dL  TSH     Status: None   Collection Time: 08/07/23  9:15 AM  Result Value Ref Range   TSH 2.020 0.450 - 4.500 uIU/mL  CMP14+EGFR     Status: Abnormal   Collection Time: 08/07/23  9:15 AM  Result Value Ref Range   Glucose 116 (H) 70 - 99 mg/dL   BUN 8 8 - 27 mg/dL   Creatinine, Ser 6.21 0.76 - 1.27 mg/dL   eGFR 89 >30 QM/VHQ/4.69   BUN/Creatinine Ratio 9 (L) 10 - 24   Sodium 143 134 - 144 mmol/L   Potassium 3.8 3.5 - 5.2 mmol/L   Chloride 110 (H) 96 - 106 mmol/L   CO2 22 20 - 29 mmol/L   Calcium  8.6 8.6 - 10.2 mg/dL   Total Protein 5.7 (L) 6.0 - 8.5 g/dL   Albumin 3.9 3.8 - 4.8 g/dL   Globulin, Total 1.8 1.5 - 4.5 g/dL   Bilirubin Total 0.2 0.0 - 1.2 mg/dL   Alkaline Phosphatase 69 44 - 121 IU/L   AST 61 (H) 0 - 40 IU/L   ALT 45 (H) 0 - 44 IU/L  Lipid panel     Status: Abnormal   Collection Time: 08/07/23  9:15 AM  Result Value Ref Range   Cholesterol, Total 119 100 - 199 mg/dL   Triglycerides 629 (H) 0 - 149 mg/dL   HDL 58 >52 mg/dL   VLDL Cholesterol Cal 26 5 - 40 mg/dL   LDL Chol Calc (NIH) 35 0 - 99 mg/dL   Chol/HDL Ratio 2.1 0.0 - 5.0 ratio    Comment:                                   T. Chol/HDL Ratio                                             Men  Women                               1/2 Avg.Risk  3.4    3.3                                   Avg.Risk  5.0    4.4                                2X Avg.Risk  9.6    7.1  3X Avg.Risk 23.4   11.0   POCT CBG (Fasting - Glucose)     Status: Abnormal   Collection Time: 08/08/23  9:09 AM  Result Value Ref Range   Glucose Fasting, POC 149 (A) 70 - 99 mg/dL  Urinalysis, Complete     Status: Abnormal   Collection Time: 08/14/23 10:31 AM  Result Value Ref Range   Specific Gravity, UA 1.025 1.005 - 1.030   pH, UA 5.5 5.0 - 7.5   Color, UA Yellow Yellow   Appearance Ur Clear Clear   Leukocytes,UA Negative Negative    Protein,UA 2+ (A) Negative/Trace   Glucose, UA Negative Negative   Ketones, UA Trace (A) Negative   RBC, UA Negative Negative   Bilirubin, UA Negative Negative   Urobilinogen, Ur 0.2 0.2 - 1.0 mg/dL   Nitrite, UA Negative Negative   Microscopic Examination See below:   Microscopic Examination     Status: Abnormal   Collection Time: 08/14/23 10:31 AM   Urine  Result Value Ref Range   WBC, UA 0-5 0 - 5 /hpf   RBC, Urine 0-2 0 - 2 /hpf   Epithelial Cells (non renal) 0-10 0 - 10 /hpf   Mucus, UA Present (A) Not Estab.   Bacteria, UA Few None seen/Few  Ferritin     Status: Abnormal   Collection Time: 08/15/23 12:58 PM  Result Value Ref Range   Ferritin 14 (L) 24 - 336 ng/mL    Comment: Performed at [facility], [address] [place], [place] [address]  Iron  and TIBC     Status: Abnormal   Collection Time: 08/15/23 12:58 PM  Result Value Ref Range   Iron  69 45 - 182 ug/dL   TIBC 914 (H) 782 - 956 ug/dL   Saturation Ratios 14 (L) 17.9 - 39.5 %   UIBC 410 ug/dL    Comment: Performed at [facility], [address] [place], [place] [address]  Lactate dehydrogenase     Status: None   Collection Time: 08/15/23 12:58 PM  Result Value Ref Range   LDH 113 98 - 192 U/L    Comment: Performed at [facility], [address] [place], [place] [address]  Basic metabolic panel     Status: Abnormal   Collection Time: 08/15/23 12:58 PM  Result Value Ref Range   Sodium 135 135 - 145 mmol/L   Potassium 3.6 3.5 - 5.1 mmol/L   Chloride 105 98 - 111 mmol/L   CO2 19 (L) 22 - 32 mmol/L   Glucose, Bld 181 (H) 70 - 99 mg/dL    Comment: Glucose reference range applies only to samples taken after fasting for at least 8 hours.   BUN 11 8 - 23 mg/dL   Creatinine, Ser 6.96 0.61 - 1.24 mg/dL   Calcium  8.6 (L) 8.9 - 10.3 mg/dL   GFR, Estimated >29 >52 mL/min    Comment: (NOTE) Calculated using the CKD-EPI Creatinine Equation (2021)    Anion gap 11 5 - 15    Comment:  Performed at [facility], [address] [place], [place] [address]  CBC with Differential (Cancer Center Only)     Status: Abnormal   Collection Time: 08/15/23 12:58 PM  Result Value Ref Range   WBC Count 4.5 4.0 - 10.5 K/uL   RBC 4.54 4.22 - 5.81 MIL/uL   Hemoglobin 12.3 (L) 13.0 - 17.0 g/dL   HCT 44.0 10.2 - 72.5 %   MCV 86.3 80.0 -  100.0 fL   MCH 27.1 26.0 - 34.0 pg   MCHC 31.4 30.0 - 36.0 g/dL   RDW 16.1 (H) 09.6 - 04.5 %   Platelet Count 132 (L) 150 - 400 K/uL   nRBC 0.0 0.0 - 0.2 %   Neutrophils Relative % 69 %   Neutro Abs 3.1 1.7 - 7.7 K/uL   Lymphocytes Relative 20 %   Lymphs Abs 0.9 0.7 - 4.0 K/uL   Monocytes Relative 8 %   Monocytes Absolute 0.4 0.1 - 1.0 K/uL   Eosinophils Relative 2 %   Eosinophils Absolute 0.1 0.0 - 0.5 K/uL   Basophils Relative 1 %   Basophils Absolute 0.0 0.0 - 0.1 K/uL   Immature Granulocytes 0 %   Abs Immature Granulocytes 0.01 0.00 - 0.07 K/uL    Comment: Performed at [facility], [address] [place], [place] [address]  POCT CBG (Fasting - Glucose)     Status: Abnormal   Collection Time: 08/17/23  9:32 AM  Result Value Ref Range   Glucose Fasting, POC 133 (A) 70 - 99 mg/dL  POCT CBG (Fasting - Glucose)     Status: Abnormal   Collection Time: 08/24/23  9:26 AM  Result Value Ref Range   Glucose Fasting, POC 165 (A) 70 - 99 mg/dL      Assessment & Plan:  Continue current meds. Avoid triggering activities like lifting heavy objects. Problem List Items Addressed This Visit     Diabetes mellitus type 2, uncomplicated (HCC) - Primary   Relevant Orders   POCT CBG (Fasting - Glucose) (Completed)    Follow up as scheduled.  Total time spent: 25 minutes  Aisha Hove, MD  08/24/2023   This document may have been prepared by [facility] Voice Recognition software and as such may include unintentional dictation errors.

## 2023-09-01 ENCOUNTER — Ambulatory Visit: Payer: Medicare HMO | Admitting: Cardiovascular Disease

## 2023-09-13 ENCOUNTER — Telehealth: Payer: Self-pay

## 2023-09-13 NOTE — Telephone Encounter (Signed)
 Per patient he was speaking about his bleeding problem as he was seen here last for internal hemorrhoids. A referral has been faxed to [facility] for them to see him.

## 2023-09-13 NOTE — Telephone Encounter (Signed)
 Patient walked in and stated he is wanting a referral for his breathing.  Patient verbalized he kept putting it off but he is now ready to see the [facility] regarding his breathing.

## 2023-09-14 ENCOUNTER — Inpatient Hospital Stay: Attending: Internal Medicine

## 2023-09-14 VITALS — BP 147/71 | HR 62 | Temp 98.2°F | Resp 18

## 2023-09-14 DIAGNOSIS — Z79899 Other long term (current) drug therapy: Secondary | ICD-10-CM | POA: Insufficient documentation

## 2023-09-14 DIAGNOSIS — D649 Anemia, unspecified: Secondary | ICD-10-CM | POA: Diagnosis not present

## 2023-09-14 DIAGNOSIS — D500 Iron deficiency anemia secondary to blood loss (chronic): Secondary | ICD-10-CM

## 2023-09-14 MED ORDER — IRON SUCROSE 20 MG/ML IV SOLN
200.0000 mg | Freq: Once | INTRAVENOUS | Status: AC
  Administered 2023-09-14: 200 mg via INTRAVENOUS
  Filled 2023-09-14: qty 10

## 2023-09-18 ENCOUNTER — Encounter: Payer: Self-pay | Admitting: Cardiovascular Disease

## 2023-09-18 ENCOUNTER — Ambulatory Visit: Admitting: Cardiovascular Disease

## 2023-09-18 VITALS — BP 148/70 | HR 76 | Ht 70.0 in | Wt 258.0 lb

## 2023-09-18 DIAGNOSIS — I10 Essential (primary) hypertension: Secondary | ICD-10-CM

## 2023-09-18 DIAGNOSIS — I480 Paroxysmal atrial fibrillation: Secondary | ICD-10-CM | POA: Diagnosis not present

## 2023-09-18 DIAGNOSIS — E119 Type 2 diabetes mellitus without complications: Secondary | ICD-10-CM

## 2023-09-18 DIAGNOSIS — E782 Mixed hyperlipidemia: Secondary | ICD-10-CM | POA: Diagnosis not present

## 2023-09-18 DIAGNOSIS — R0602 Shortness of breath: Secondary | ICD-10-CM | POA: Diagnosis not present

## 2023-09-18 NOTE — Progress Notes (Signed)
 Cardiology Office Note   Date:  09/18/2023   ID:  Brandon, Gibson Jun 15, 1950, MRN [id number]  PCP:  Aisha Hove, MD  Cardiologist:  Debborah Fairly, MD      History of Present Illness: Brandon Gibson is a 73 y.o. male who presents for  Chief Complaint  Patient presents with   Follow-up    4 months follow up    Doing well, came back from [place], no symptoms.      Past Medical History:  Diagnosis Date   Arthritis    Atrial fibrillation (HCC)    BPH (benign prostatic hyperplasia)    Cancer (HCC)    HX SKIN CANCER Basal cell   Chickenpox    Clotting disorder (HCC)    Diabetes mellitus without complication (HCC)    type 2   Dysrhythmia    IRREG HEART BEAT   GERD (gastroesophageal reflux disease)    H/O pleurisy    Hypercholesteremia    Hyperlipidemia    Hypertension    Iron  deficiency anemia due to chronic blood loss 09/18/2017   Lumbar stenosis    Measles    Mumps    Sleep apnea    sleep study Dr. Phyllis Breeze, uses CPAP     Past Surgical History:  Procedure Laterality Date   ANTERIOR CERVICAL DECOMP/DISCECTOMY FUSION  11/25/2011   Procedure: ANTERIOR CERVICAL DECOMPRESSION/DISCECTOMY FUSION 2 LEVELS;  Surgeon: Adelbert Adler, MD;  Location: [facility];  Service: Neurosurgery;  Laterality: N/A;  Cervical four-five,Cervical five-six  Anterior cervical decompression/diskectomy, fusion, plate   BREAST BIOPSY Right    Benign   BREAST SURGERY Left 1986   lumpectomy   CARDIAC CATHETERIZATION     2011, [facility]   CARDIOVASCULAR STRESS TEST  2011   CERVICAL FUSION  1988   C 6/7    COLONOSCOPY WITH PROPOFOL  N/A 11/08/2017   Procedure: COLONOSCOPY WITH PROPOFOL ;  Surgeon: Toledo, Alphonsus Jeans, MD;  Location: [facility];  Service: Gastroenterology;  Laterality: N/A;   ESOPHAGOGASTRODUODENOSCOPY (EGD) WITH PROPOFOL  N/A 11/08/2017   Procedure: ESOPHAGOGASTRODUODENOSCOPY (EGD) WITH PROPOFOL ;  Surgeon: Toledo, Alphonsus Jeans, MD;  Location: [facility];   Service: Gastroenterology;  Laterality: N/A;   EYE SURGERY     LASIK   JOINT REPLACEMENT     KNEE ARTHROPLASTY Right 10/05/2015   Procedure: COMPUTER ASSISTED TOTAL KNEE ARTHROPLASTY;  Surgeon: Arlyne Lame, MD;  Location: [facility];  Service: Orthopedics;  Laterality: Right;   KNEE ARTHROSCOPY  1986   Right   KNEE ARTHROSCOPY Left 08/10/2015   Procedure: LEFT KNEE ARTHROSCOPY, CHONDROPLASTY, MEDIAL MENISECTOMY;  Surgeon: Arlyne Lame, MD;  Location: [facility];  Service: Orthopedics;  Laterality: Left;   LAMINECTOMY WITH POSTERIOR LATERAL ARTHRODESIS LEVEL 2 N/A 07/16/2018   Procedure: Posterior lumbar fusion with instrumentation at L3-4 with repeat facetectomy L3-4;  Surgeon: Isadora Mar, MD;  Location: [facility];  Service: Neurosurgery;  Laterality: N/A;  Posterior lumbar fusion with instrumentation at L3-4 with repeat facetectomy L3-4   LUMBAR LAMINECTOMY/DECOMPRESSION MICRODISCECTOMY N/A 10/04/2013   Procedure: LUMBAR TWO TO THREE LUMBAR LAMINECTOMY/DECOMPRESSION MICRODISCECTOMY 1 LEVEL;  Surgeon: Adelbert Adler, MD;  Location: [facility];  Service: Neurosurgery;  Laterality: N/A;  L2-3 Laminectomy   POSTERIOR LAMINECTOMY / DECOMPRESSION LUMBAR SPINE  2006   TRANSESOPHAGEAL ECHOCARDIOGRAM  2011     Current Outpatient Medications  Medication Sig Dispense Refill   ACCU-CHEK AVIVA PLUS test strip TEST BLOOD SUGAR EVERY DAY 100 strip 3  amiodarone  (PACERONE ) 200 MG tablet TAKE 1 TABLET EVERY DAY (Patient taking differently: Take 100 mg by mouth daily.) 90 tablet 3   baclofen  (LIORESAL ) 10 MG tablet Take 1 tablet (10 mg total) by mouth 2 (two) times daily. 60 tablet 1   chlorthalidone (HYGROTON) 25 MG tablet TAKE 1 TABLET EVERY DAY 90 tablet 3   docusate (COLACE) 50 MG/5ML liquid Take by mouth daily.     ELIQUIS  5 MG TABS tablet TAKE 1 TABLET TWICE DAILY 180 tablet 3   fluticasone (FLONASE) 50 MCG/ACT nasal spray Place 2 sprays into both nostrils daily.     hydrALAZINE  (APRESOLINE )  50 MG tablet TAKE 1 TABLET TWICE DAILY 180 tablet 3   Iron -Vitamin C  65-125 MG TABS Take 1 tablet by mouth 2 (two) times daily. 180 tablet 1   loratadine  (CLARITIN ) 10 MG tablet Take 1 tablet (10 mg total) by mouth daily. 90 tablet 3   metFORMIN  (GLUCOPHAGE ) 500 MG tablet Take 1 tablet (500 mg total) by mouth 2 (two) times daily with a meal. 2 tabs in the morning one at night 180 tablet 3   metoprolol  succinate (TOPROL -XL) 50 MG 24 hr tablet TAKE 1 TABLET EVERY DAY 90 tablet 3   omeprazole (PRILOSEC) 40 MG capsule TAKE 1 CAPSULE EVERY DAY 1/2 HOUR BEFORE A MEAL 90 capsule 3   potassium chloride  (KLOR-CON ) 10 MEQ tablet TAKE 1 TABLET EVERY DAY 90 tablet 3   rosuvastatin  (CRESTOR ) 20 MG tablet TAKE 1 TABLET EVERY DAY 90 tablet 3   tamsulosin  (FLOMAX ) 0.4 MG CAPS capsule Take 1 capsule (0.4 mg total) by mouth daily. 90 capsule 3   trimethoprim  (TRIMPEX ) 100 MG tablet Take 1 tablet (100 mg total) by mouth daily. 90 tablet 3   No current facility-administered medications for this visit.    Allergies:   Morphine  and codeine, Ace inhibitors, Diazepam , and Tape    Social History:   reports that he has never smoked. He has never used smokeless tobacco. He reports current alcohol  use of about 14.0 standard drinks of alcohol  per week. He reports that he does not use drugs.   Family History:  family history includes Diabetes in his father; Heart disease in his mother; Leukemia in his father.    ROS:     Review of Systems  Constitutional: Negative.   HENT: Negative.    Eyes: Negative.   Respiratory: Negative.    Gastrointestinal: Negative.   Genitourinary: Negative.   Musculoskeletal: Negative.   Skin: Negative.   Neurological: Negative.   Endo/Heme/Allergies: Negative.   Psychiatric/Behavioral: Negative.    All other systems reviewed and are negative.     All other systems are reviewed and negative.    PHYSICAL EXAM: VS:  BP (!) 148/70   Pulse 76   Ht 5\' 10"  (1.778 m)   Wt 258 lb  (117 kg)   SpO2 97%   BMI 37.02 kg/m  , BMI Body mass index is 37.02 kg/m. Last weight:  Wt Readings from Last 3 Encounters:  09/18/23 258 lb (117 kg)  08/24/23 259 lb (117.5 kg)  08/17/23 256 lb 12.8 oz (116.5 kg)     Physical Exam Vitals reviewed.  Constitutional:      Appearance: Normal appearance. He is normal weight.  HENT:     Head: Normocephalic.     Nose: Nose normal.     Mouth/Throat:     Mouth: Mucous membranes are moist.  Eyes:     Pupils: Pupils are equal, round, and  reactive to light.  Cardiovascular:     Rate and Rhythm: Normal rate and regular rhythm.     Pulses: Normal pulses.     Heart sounds: Normal heart sounds.  Pulmonary:     Effort: Pulmonary effort is normal.  Abdominal:     General: Abdomen is flat. Bowel sounds are normal.  Musculoskeletal:        General: Normal range of motion.     Cervical back: Normal range of motion.  Skin:    General: Skin is warm.  Neurological:     General: No focal deficit present.     Mental Status: He is alert.  Psychiatric:        Mood and Affect: Mood normal.       EKG:   Recent Labs: 08/07/2023: ALT 45; TSH 2.020 08/15/2023: BUN 11; Creatinine, Ser 0.83; Hemoglobin 12.3; Platelet Count 132; Potassium 3.6; Sodium 135    Lipid Panel    Component Value Date/Time   CHOL 119 08/07/2023 0915   TRIG 161 (H) 08/07/2023 0915   HDL 58 08/07/2023 0915   CHOLHDL 2.1 08/07/2023 0915   LDLCALC 35 08/07/2023 0915      Other studies Reviewed: Additional studies/ records that were reviewed today include:  Review of the above records demonstrates:       No data to display            ASSESSMENT AND PLAN:    ICD-10-CM   1. Type 2 diabetes mellitus without complication, without long-term current use of insulin  (HCC)  E11.9     2. Mixed hyperlipidemia  E78.2     3. SOB (shortness of breath)  R06.02    feeling better    4. Essential hypertension, benign  I10     5. Paroxysmal A-fib (HCC)  I48.0     on 100 mg amiodrone       Problem List Items Addressed This Visit       Cardiovascular and Mediastinum   Essential hypertension, benign     Endocrine   Diabetes mellitus type 2, uncomplicated (HCC) - Primary     Other   Hyperlipidemia   Other Visit Diagnoses       SOB (shortness of breath)       feeling better     Paroxysmal A-fib (HCC)       on 100 mg amiodrone          Disposition:   Return in about 3 months (around 12/19/2023).    Total time spent: 30 minutes  Signed,  Debborah Fairly, MD  09/18/2023 11:18 AM    [facility]

## 2023-09-20 DIAGNOSIS — L821 Other seborrheic keratosis: Secondary | ICD-10-CM | POA: Diagnosis not present

## 2023-09-20 DIAGNOSIS — Z08 Encounter for follow-up examination after completed treatment for malignant neoplasm: Secondary | ICD-10-CM | POA: Diagnosis not present

## 2023-09-20 DIAGNOSIS — Z85828 Personal history of other malignant neoplasm of skin: Secondary | ICD-10-CM | POA: Diagnosis not present

## 2023-09-20 DIAGNOSIS — Z09 Encounter for follow-up examination after completed treatment for conditions other than malignant neoplasm: Secondary | ICD-10-CM | POA: Diagnosis not present

## 2023-09-20 DIAGNOSIS — D2272 Melanocytic nevi of left lower limb, including hip: Secondary | ICD-10-CM | POA: Diagnosis not present

## 2023-09-20 DIAGNOSIS — D2261 Melanocytic nevi of right upper limb, including shoulder: Secondary | ICD-10-CM | POA: Diagnosis not present

## 2023-09-20 DIAGNOSIS — C44719 Basal cell carcinoma of skin of left lower limb, including hip: Secondary | ICD-10-CM | POA: Diagnosis not present

## 2023-09-20 DIAGNOSIS — D225 Melanocytic nevi of trunk: Secondary | ICD-10-CM | POA: Diagnosis not present

## 2023-09-20 DIAGNOSIS — C44712 Basal cell carcinoma of skin of right lower limb, including hip: Secondary | ICD-10-CM | POA: Diagnosis not present

## 2023-09-20 DIAGNOSIS — D2271 Melanocytic nevi of right lower limb, including hip: Secondary | ICD-10-CM | POA: Diagnosis not present

## 2023-09-20 DIAGNOSIS — D2262 Melanocytic nevi of left upper limb, including shoulder: Secondary | ICD-10-CM | POA: Diagnosis not present

## 2023-10-27 DIAGNOSIS — Z96651 Presence of right artificial knee joint: Secondary | ICD-10-CM | POA: Diagnosis not present

## 2023-10-31 ENCOUNTER — Other Ambulatory Visit: Payer: Self-pay | Admitting: Internal Medicine

## 2023-10-31 DIAGNOSIS — E119 Type 2 diabetes mellitus without complications: Secondary | ICD-10-CM

## 2023-11-04 ENCOUNTER — Other Ambulatory Visit: Payer: Self-pay | Admitting: Internal Medicine

## 2023-11-06 ENCOUNTER — Other Ambulatory Visit: Payer: Self-pay | Admitting: Internal Medicine

## 2023-11-14 ENCOUNTER — Telehealth: Payer: Self-pay | Admitting: Urology

## 2023-11-14 NOTE — Telephone Encounter (Signed)
 Pt called and needs to get his Tamsulosin . He wants it to be annual and gets 90 days supply at one time. His pharmacy is still [facility]. They mail him his prescriptions.

## 2023-11-16 ENCOUNTER — Other Ambulatory Visit: Payer: Self-pay

## 2023-11-16 DIAGNOSIS — N3001 Acute cystitis with hematuria: Secondary | ICD-10-CM

## 2023-11-16 DIAGNOSIS — N401 Enlarged prostate with lower urinary tract symptoms: Secondary | ICD-10-CM

## 2023-11-16 DIAGNOSIS — R399 Unspecified symptoms and signs involving the genitourinary system: Secondary | ICD-10-CM

## 2023-11-16 DIAGNOSIS — N411 Chronic prostatitis: Secondary | ICD-10-CM

## 2023-11-16 MED ORDER — TAMSULOSIN HCL 0.4 MG PO CAPS: 0.4000 mg | ORAL_CAPSULE | Freq: Every day | ORAL | 3 refills | Status: AC

## 2023-11-16 NOTE — Telephone Encounter (Signed)
 Refill for tamsulosin  sent, per Dr.MacDiarmid. pt informed, voiced understanding.

## 2023-11-22 DIAGNOSIS — L905 Scar conditions and fibrosis of skin: Secondary | ICD-10-CM | POA: Diagnosis not present

## 2023-11-22 DIAGNOSIS — C44719 Basal cell carcinoma of skin of left lower limb, including hip: Secondary | ICD-10-CM | POA: Diagnosis not present

## 2023-11-28 ENCOUNTER — Other Ambulatory Visit: Payer: Self-pay | Admitting: Internal Medicine

## 2023-11-29 DIAGNOSIS — E1165 Type 2 diabetes mellitus with hyperglycemia: Secondary | ICD-10-CM | POA: Diagnosis not present

## 2023-11-29 DIAGNOSIS — D500 Iron deficiency anemia secondary to blood loss (chronic): Secondary | ICD-10-CM | POA: Diagnosis not present

## 2023-11-29 DIAGNOSIS — K5904 Chronic idiopathic constipation: Secondary | ICD-10-CM | POA: Diagnosis not present

## 2023-11-29 DIAGNOSIS — Z7901 Long term (current) use of anticoagulants: Secondary | ICD-10-CM | POA: Diagnosis not present

## 2023-11-29 DIAGNOSIS — K642 Third degree hemorrhoids: Secondary | ICD-10-CM | POA: Diagnosis not present

## 2023-11-29 DIAGNOSIS — I480 Paroxysmal atrial fibrillation: Secondary | ICD-10-CM | POA: Diagnosis not present

## 2023-11-29 DIAGNOSIS — K921 Melena: Secondary | ICD-10-CM | POA: Diagnosis not present

## 2023-12-06 DIAGNOSIS — C44712 Basal cell carcinoma of skin of right lower limb, including hip: Secondary | ICD-10-CM | POA: Diagnosis not present

## 2023-12-08 ENCOUNTER — Other Ambulatory Visit: Payer: Self-pay | Admitting: Cardiology

## 2023-12-08 ENCOUNTER — Telehealth: Payer: Self-pay

## 2023-12-08 ENCOUNTER — Ambulatory Visit: Admitting: Internal Medicine

## 2023-12-08 DIAGNOSIS — J301 Allergic rhinitis due to pollen: Secondary | ICD-10-CM

## 2023-12-08 MED ORDER — LORATADINE 10 MG PO TABS: 10.0000 mg | ORAL_TABLET | Freq: Every day | ORAL | 0 refills | Status: AC

## 2023-12-08 NOTE — Telephone Encounter (Signed)
 Patient called wanting refill on Loratidine 10mg  called into [facility] [address] for pick up today.

## 2023-12-13 ENCOUNTER — Other Ambulatory Visit

## 2023-12-13 DIAGNOSIS — E119 Type 2 diabetes mellitus without complications: Secondary | ICD-10-CM

## 2023-12-13 DIAGNOSIS — E782 Mixed hyperlipidemia: Secondary | ICD-10-CM

## 2023-12-13 DIAGNOSIS — I10 Essential (primary) hypertension: Secondary | ICD-10-CM | POA: Diagnosis not present

## 2023-12-14 ENCOUNTER — Ambulatory Visit: Payer: Self-pay | Admitting: Internal Medicine

## 2023-12-14 LAB — CMP14+EGFR
ALT: 40 IU/L (ref 0–44)
AST: 32 IU/L (ref 0–40)
Albumin: 4.2 g/dL (ref 3.8–4.8)
Alkaline Phosphatase: 79 IU/L (ref 44–121)
BUN/Creatinine Ratio: 10 (ref 10–24)
BUN: 10 mg/dL (ref 8–27)
Bilirubin Total: 0.5 mg/dL (ref 0.0–1.2)
CO2: 24 mmol/L (ref 20–29)
Calcium: 9 mg/dL (ref 8.6–10.2)
Chloride: 101 mmol/L (ref 96–106)
Creatinine, Ser: 1.01 mg/dL (ref 0.76–1.27)
Globulin, Total: 1.7 g/dL (ref 1.5–4.5)
Glucose: 151 mg/dL — ABNORMAL HIGH (ref 70–99)
Potassium: 3.5 mmol/L (ref 3.5–5.2)
Sodium: 139 mmol/L (ref 134–144)
Total Protein: 5.9 g/dL — ABNORMAL LOW (ref 6.0–8.5)
eGFR: 78 mL/min/1.73 (ref >59)

## 2023-12-14 LAB — LIPID PANEL
Chol/HDL Ratio: 1.9 ratio (ref 0.0–5.0)
Cholesterol, Total: 116 mg/dL (ref 100–199)
HDL: 61 mg/dL (ref >39)
LDL Chol Calc (NIH): 34 mg/dL (ref 0–99)
Triglycerides: 120 mg/dL (ref 0–149)
VLDL Cholesterol Cal: 21 mg/dL (ref 5–40)

## 2023-12-14 LAB — HEMOGLOBIN A1C
Est. average glucose Bld gHb Est-mCnc: 148 mg/dL
Hgb A1c MFr Bld: 6.8 % — ABNORMAL HIGH (ref 4.8–5.6)

## 2023-12-14 LAB — TSH: TSH: 1.86 u[IU]/mL (ref 0.450–4.500)

## 2023-12-15 ENCOUNTER — Inpatient Hospital Stay

## 2023-12-15 ENCOUNTER — Encounter: Payer: Self-pay | Admitting: Internal Medicine

## 2023-12-15 ENCOUNTER — Inpatient Hospital Stay: Admitting: Internal Medicine

## 2023-12-15 ENCOUNTER — Inpatient Hospital Stay: Attending: Internal Medicine

## 2023-12-15 VITALS — BP 137/82 | HR 65 | Temp 97.9°F | Resp 16 | Ht 70.0 in | Wt 255.8 lb

## 2023-12-15 DIAGNOSIS — D696 Thrombocytopenia, unspecified: Secondary | ICD-10-CM | POA: Insufficient documentation

## 2023-12-15 DIAGNOSIS — Z8249 Family history of ischemic heart disease and other diseases of the circulatory system: Secondary | ICD-10-CM | POA: Diagnosis not present

## 2023-12-15 DIAGNOSIS — Z885 Allergy status to narcotic agent status: Secondary | ICD-10-CM | POA: Insufficient documentation

## 2023-12-15 DIAGNOSIS — I10 Essential (primary) hypertension: Secondary | ICD-10-CM | POA: Diagnosis not present

## 2023-12-15 DIAGNOSIS — D649 Anemia, unspecified: Secondary | ICD-10-CM

## 2023-12-15 DIAGNOSIS — E119 Type 2 diabetes mellitus without complications: Secondary | ICD-10-CM | POA: Diagnosis not present

## 2023-12-15 DIAGNOSIS — I4891 Unspecified atrial fibrillation: Secondary | ICD-10-CM | POA: Diagnosis not present

## 2023-12-15 DIAGNOSIS — Z833 Family history of diabetes mellitus: Secondary | ICD-10-CM | POA: Insufficient documentation

## 2023-12-15 DIAGNOSIS — E611 Iron deficiency: Secondary | ICD-10-CM | POA: Diagnosis not present

## 2023-12-15 DIAGNOSIS — Z79899 Other long term (current) drug therapy: Secondary | ICD-10-CM | POA: Insufficient documentation

## 2023-12-15 DIAGNOSIS — K649 Unspecified hemorrhoids: Secondary | ICD-10-CM | POA: Insufficient documentation

## 2023-12-15 DIAGNOSIS — Z806 Family history of leukemia: Secondary | ICD-10-CM | POA: Insufficient documentation

## 2023-12-15 DIAGNOSIS — G4730 Sleep apnea, unspecified: Secondary | ICD-10-CM | POA: Diagnosis not present

## 2023-12-15 DIAGNOSIS — R5383 Other fatigue: Secondary | ICD-10-CM | POA: Diagnosis not present

## 2023-12-15 DIAGNOSIS — Z7901 Long term (current) use of anticoagulants: Secondary | ICD-10-CM | POA: Diagnosis not present

## 2023-12-15 DIAGNOSIS — Z85828 Personal history of other malignant neoplasm of skin: Secondary | ICD-10-CM | POA: Insufficient documentation

## 2023-12-15 DIAGNOSIS — D500 Iron deficiency anemia secondary to blood loss (chronic): Secondary | ICD-10-CM

## 2023-12-15 LAB — CBC WITH DIFFERENTIAL (CANCER CENTER ONLY)
Abs Immature Granulocytes: 0.02 K/uL (ref 0.00–0.07)
Basophils Absolute: 0 K/uL (ref 0.0–0.1)
Basophils Relative: 1 %
Eosinophils Absolute: 0.1 K/uL (ref 0.0–0.5)
Eosinophils Relative: 1 %
HCT: 39.3 % (ref 39.0–52.0)
Hemoglobin: 13.2 g/dL (ref 13.0–17.0)
Immature Granulocytes: 1 %
Lymphocytes Relative: 21 %
Lymphs Abs: 0.9 K/uL (ref 0.7–4.0)
MCH: 29.8 pg (ref 26.0–34.0)
MCHC: 33.6 g/dL (ref 30.0–36.0)
MCV: 88.7 fL (ref 80.0–100.0)
Monocytes Absolute: 0.5 K/uL (ref 0.1–1.0)
Monocytes Relative: 11 %
Neutro Abs: 2.8 K/uL (ref 1.7–7.7)
Neutrophils Relative %: 65 %
Platelet Count: 131 K/uL — ABNORMAL LOW (ref 150–400)
RBC: 4.43 MIL/uL (ref 4.22–5.81)
RDW: 14.5 % (ref 11.5–15.5)
WBC Count: 4.3 K/uL (ref 4.0–10.5)
nRBC: 0 % (ref 0.0–0.2)

## 2023-12-15 LAB — IRON AND TIBC
Iron: 68 ug/dL (ref 45–182)
Saturation Ratios: 14 % — ABNORMAL LOW (ref 17.9–39.5)
TIBC: 487 ug/dL — ABNORMAL HIGH (ref 250–450)
UIBC: 419 ug/dL

## 2023-12-15 LAB — BASIC METABOLIC PANEL WITH GFR
Anion gap: 9 (ref 5–15)
BUN: 14 mg/dL (ref 8–23)
CO2: 25 mmol/L (ref 22–32)
Calcium: 9.1 mg/dL (ref 8.9–10.3)
Chloride: 101 mmol/L (ref 98–111)
Creatinine, Ser: 1.04 mg/dL (ref 0.61–1.24)
GFR, Estimated: >60 mL/min (ref >60)
Glucose, Bld: 188 mg/dL — ABNORMAL HIGH (ref 70–99)
Potassium: 3.5 mmol/L (ref 3.5–5.1)
Sodium: 135 mmol/L (ref 135–145)

## 2023-12-15 LAB — LACTATE DEHYDROGENASE: LDH: 121 U/L (ref 98–192)

## 2023-12-15 LAB — FERRITIN: Ferritin: 19 ng/mL — ABNORMAL LOW (ref 24–336)

## 2023-12-15 MED ORDER — IRON SUCROSE 20 MG/ML IV SOLN
200.0000 mg | Freq: Once | INTRAVENOUS | Status: AC
  Administered 2023-12-15: 200 mg via INTRAVENOUS
  Filled 2023-12-15: qty 10

## 2023-12-15 NOTE — Progress Notes (Signed)
 [facility] CONSULT NOTE  Patient Care Team: Fernand Fredy RAMAN, MD as PCP - General (Internal Medicine) Rennie Cindy SAUNDERS, MD as Consulting Physician (Oncology)  CHIEF COMPLAINTS/PURPOSE OF CONSULTATION: ANEMIA  HEMATOLOGY HISTORY  # ANEMIA[Hb; MCV-platelets- WBC; Iron  sat; ferritin;  GFR- CT/US - ;   HISTORY OF PRESENTING ILLNESS: Patient ambulating-independently. Alone.  Brandon Gibson Biljon 73 y.o.  male pleasant patient is history of diabetes and also A-fib on Xarelto -iron  deficient anemia is here for follow-up.  Patient continues to have blood in stools/hemorrhoidal. Denies any fatigue.   Patient s/p iron  infusions. On PO iron . Ongoing fatigue.  No falls. Patient continues to have chronic bleeding hemorrhoids.    Review of Systems  Constitutional:  Positive for malaise/fatigue. Negative for chills, diaphoresis, fever and weight loss.  HENT:  Negative for nosebleeds and sore throat.   Eyes:  Negative for double vision.  Respiratory:  Negative for cough, hemoptysis, sputum production, shortness of breath and wheezing.   Cardiovascular:  Negative for chest pain, palpitations, orthopnea and leg swelling.  Gastrointestinal:  Negative for abdominal pain, blood in stool, constipation, diarrhea, heartburn, melena, nausea and vomiting.  Genitourinary:  Negative for dysuria, frequency and urgency.  Musculoskeletal:  Negative for back pain and joint pain.  Skin: Negative.  Negative for itching and rash.  Neurological:  Negative for dizziness, tingling, focal weakness, weakness and headaches.  Endo/Heme/Allergies:  Does not bruise/bleed easily.  Psychiatric/Behavioral:  Negative for depression. The patient is not nervous/anxious and does not have insomnia.      MEDICAL HISTORY:  Past Medical History:  Diagnosis Date   Arthritis    Atrial fibrillation (HCC)    BPH (benign prostatic hyperplasia)    Cancer (HCC)    HX SKIN CANCER Basal cell   Chickenpox    Clotting  disorder (HCC)    Diabetes mellitus without complication (HCC)    type 2   Dysrhythmia    IRREG HEART BEAT   GERD (gastroesophageal reflux disease)    H/O pleurisy    Hypercholesteremia    Hyperlipidemia    Hypertension    Iron  deficiency anemia due to chronic blood loss 09/18/2017   Lumbar stenosis    Measles    Mumps    Sleep apnea    sleep study Dr. Deretha, uses CPAP    SURGICAL HISTORY: Past Surgical History:  Procedure Laterality Date   ANTERIOR CERVICAL DECOMP/DISCECTOMY FUSION  11/25/2011   Procedure: ANTERIOR CERVICAL DECOMPRESSION/DISCECTOMY FUSION 2 LEVELS;  Surgeon: Catalina CHRISTELLA Stains, MD;  Location: [facility];  Service: Neurosurgery;  Laterality: N/A;  Cervical four-five,Cervical five-six  Anterior cervical decompression/diskectomy, fusion, plate   BREAST BIOPSY Right    Benign   BREAST SURGERY Left 1986   lumpectomy   CARDIAC CATHETERIZATION     2011, [facility]   CARDIOVASCULAR STRESS TEST  2011   CERVICAL FUSION  1988   C 6/7    COLONOSCOPY WITH PROPOFOL  N/A 11/08/2017   Procedure: COLONOSCOPY WITH PROPOFOL ;  Surgeon: Toledo, Ladell POUR, MD;  Location: [facility];  Service: Gastroenterology;  Laterality: N/A;   ESOPHAGOGASTRODUODENOSCOPY (EGD) WITH PROPOFOL  N/A 11/08/2017   Procedure: ESOPHAGOGASTRODUODENOSCOPY (EGD) WITH PROPOFOL ;  Surgeon: Toledo, Ladell POUR, MD;  Location: [facility];  Service: Gastroenterology;  Laterality: N/A;   EYE SURGERY     LASIK   KNEE ARTHROPLASTY Right 10/05/2015   Procedure: COMPUTER ASSISTED TOTAL KNEE ARTHROPLASTY;  Surgeon: Lynwood SHAUNNA Hue, MD;  Location: [facility];  Service: Orthopedics;  Laterality: Right;   KNEE  ARTHROSCOPY  1986   Right   KNEE ARTHROSCOPY Left 08/10/2015   Procedure: LEFT KNEE ARTHROSCOPY, CHONDROPLASTY, MEDIAL MENISECTOMY;  Surgeon: Lynwood SHAUNNA Hue, MD;  Location: [facility];  Service: Orthopedics;  Laterality: Left;   LAMINECTOMY WITH POSTERIOR LATERAL ARTHRODESIS LEVEL 2 N/A 07/16/2018   Procedure:  Posterior lumbar fusion with instrumentation at L3-4 with repeat facetectomy L3-4;  Surgeon: Joshua Alm RAMAN, MD;  Location: [facility] OR;  Service: Neurosurgery;  Laterality: N/A;  Posterior lumbar fusion with instrumentation at L3-4 with repeat facetectomy L3-4   LUMBAR LAMINECTOMY/DECOMPRESSION MICRODISCECTOMY N/A 10/04/2013   Procedure: LUMBAR TWO TO THREE LUMBAR LAMINECTOMY/DECOMPRESSION MICRODISCECTOMY 1 LEVEL;  Surgeon: Catalina CHRISTELLA Stains, MD;  Location: [facility];  Service: Neurosurgery;  Laterality: N/A;  L2-3 Laminectomy   POSTERIOR LAMINECTOMY / DECOMPRESSION LUMBAR SPINE  2006   TRANSESOPHAGEAL ECHOCARDIOGRAM  2011    SOCIAL HISTORY: Social History   Socioeconomic History   Marital status: Widowed    Spouse name: Not on file   Number of children: Not on file   Years of education: Not on file   Highest education level: Not on file  Occupational History   Not on file  Tobacco Use   Smoking status: Never   Smokeless tobacco: Never  Vaping Use   Vaping status: Never Used  Substance and Sexual Activity   Alcohol  use: Yes    Alcohol /week: 14.0 standard drinks of alcohol     Types: 14 Glasses of wine per week   Drug use: No   Sexual activity: Not on file  Other Topics Concern   Not on file  Social History Narrative   Not on file   Social Drivers of Health   Financial Resource Strain: Low Risk  (11/29/2023)   Received from [facility]   Overall Financial Resource Strain (CARDIA)    Difficulty of Paying Living Expenses: Not hard at all  Food Insecurity: No Food Insecurity (11/29/2023)   Received from [facility]   Hunger Vital Sign    Within the past 12 months, you worried that your food would run out before you got the money to buy more.: Never true    Within the past 12 months, the food you bought just didn't last and you didn't have money to get more.: Never true  Transportation Needs: No Transportation Needs (11/29/2023)   Received from  [facility] - Transportation    In the past 12 months, has lack of transportation kept you from medical appointments or from getting medications?: No    Lack of Transportation (Non-Medical): No  Physical Activity: Not on file  Stress: Not on file  Social Connections: Not on file  Intimate Partner Violence: Not At Risk (03/16/2023)   Humiliation, Afraid, Rape, and Kick questionnaire    Fear of Current or Ex-Partner: No    Emotionally Abused: No    Physically Abused: No    Sexually Abused: No    FAMILY HISTORY: Family History  Problem Relation Age of Onset   Leukemia Father    Diabetes Father    Heart disease Mother    Prostate cancer Neg Hx    Chronic Renal Failure Neg Hx    Breast cancer Neg Hx     ALLERGIES:  is allergic to morphine  and codeine, ace inhibitors, diazepam , and tape.  MEDICATIONS:  Current Outpatient Medications  Medication Sig Dispense Refill   ACCU-CHEK AVIVA PLUS test strip TEST BLOOD SUGAR EVERY DAY 100 strip 3  amiodarone  (PACERONE ) 200 MG tablet TAKE 1 TABLET EVERY DAY 90 tablet 0   baclofen  (LIORESAL ) 10 MG tablet Take 1 tablet (10 mg total) by mouth 2 (two) times daily. 60 tablet 1   chlorthalidone (HYGROTON) 25 MG tablet TAKE 1 TABLET EVERY DAY 90 tablet 3   docusate (COLACE) 50 MG/5ML liquid Take by mouth daily.     ELIQUIS  5 MG TABS tablet TAKE 1 TABLET TWICE DAILY 180 tablet 3   fluticasone (FLONASE) 50 MCG/ACT nasal spray Place 2 sprays into both nostrils daily.     hydrALAZINE  (APRESOLINE ) 50 MG tablet TAKE 1 TABLET TWICE DAILY 180 tablet 3   Iron -Vitamin C  65-125 MG TABS Take 1 tablet by mouth 2 (two) times daily. (Patient taking differently: Take 1 tablet by mouth daily.) 180 tablet 1   loratadine  (CLARITIN ) 10 MG tablet Take 1 tablet (10 mg total) by mouth daily. 90 tablet 0   metFORMIN  (GLUCOPHAGE ) 500 MG tablet TAKE 1 TABLET TWICE DAILY 180 tablet 3   metoprolol  succinate (TOPROL -XL) 50 MG 24 hr tablet TAKE 1  TABLET EVERY DAY 90 tablet 0   omeprazole (PRILOSEC) 40 MG capsule TAKE 1 CAPSULE EVERY DAY 1/2 HOUR BEFORE A MEAL 90 capsule 0   potassium chloride  (KLOR-CON ) 10 MEQ tablet TAKE 1 TABLET EVERY DAY 90 tablet 3   rosuvastatin  (CRESTOR ) 20 MG tablet TAKE 1 TABLET EVERY DAY 90 tablet 3   tamsulosin  (FLOMAX ) 0.4 MG CAPS capsule Take 1 capsule (0.4 mg total) by mouth daily. 90 capsule 3   trimethoprim  (TRIMPEX ) 100 MG tablet Take 1 tablet (100 mg total) by mouth daily. 90 tablet 3   No current facility-administered medications for this visit.     PHYSICAL EXAMINATION:   Vitals:   12/15/23 1322  BP: 137/82  Pulse: 65  Resp: 16  Temp: 97.9 F (36.6 C)  SpO2: 96%   Filed Weights   12/15/23 1322  Weight: 255 lb 12.8 oz (116 kg)    Physical Exam Vitals and nursing note reviewed.  HENT:     Head: Normocephalic and atraumatic.     Mouth/Throat:     Pharynx: Oropharynx is clear.  Eyes:     Extraocular Movements: Extraocular movements intact.     Pupils: Pupils are equal, round, and reactive to light.  Cardiovascular:     Rate and Rhythm: Normal rate. Rhythm irregular.  Pulmonary:     Comments: Decreased breath sounds bilaterally.  Abdominal:     Palpations: Abdomen is soft.  Musculoskeletal:        General: Normal range of motion.     Cervical back: Normal range of motion.  Skin:    General: Skin is warm.  Neurological:     General: No focal deficit present.     Mental Status: He is alert and oriented to person, place, and time.  Psychiatric:        Behavior: Behavior normal.        Judgment: Judgment normal.      LABORATORY DATA:  I have reviewed the data as listed Lab Results  Component Value Date   WBC 4.3 12/15/2023   HGB 13.2 12/15/2023   HCT 39.3 12/15/2023   MCV 88.7 12/15/2023   PLT 131 (L) 12/15/2023   Recent Labs    02/01/23 0915 02/14/23 0957 05/17/23 0938 08/07/23 0915 08/15/23 1258 12/13/23 1031 12/15/23 1330  NA 142   < > 137 143 135 139  135  K 3.9   < > 3.5 3.8  3.6 3.5 3.5  CL 103   < > 100 110* 105 101 101  CO2 22   < > 24 22 19* 24 25  GLUCOSE 146*   < > 170* 116* 181* 151* 188*  BUN 11   < > 12 8 11 10 14   CREATININE 1.02   < > 1.14 0.90 0.83 1.01 1.04  CALCIUM  8.6   < > 9.1 8.6 8.6* 9.0 9.1  GFRNONAA  --   --  >60  --  >60  --  >60  PROT 5.5*  --   --  5.7*  --  5.9*  --   ALBUMIN 4.1  --   --  3.9  --  4.2  --   AST 39  --   --  61*  --  32  --   ALT 36  --   --  45*  --  40  --   ALKPHOS 58  --   --  69  --  79  --   BILITOT 0.3  --   --  0.2  --  0.5  --    < > = values in this interval not displayed.     No results found.  ASSESSMENT & PLAN:   Symptomatic anemia # Iron  deficiency anemia- Hb 8 [PCP-NOV 2024-ferritin-12; I sat-10]-symptomatic. Etiology GI blood loss-?  Hemorrhoidal -/patient on Xarelto . Continue Vitron C  # s/p venofer  -  Hb 13.2 - proceed with venofer  today.   # Etiology of iron  deficiency: Unclear;-  s/p previous d GI evaluation-EGD colonoscopy; /capsule study [2019]- [facility]- awaiting hemorridal  surgery with Dr.Toledo in AUG 2025.   # Mild intermittent thrombocytopenia platelets greater than 100 stable. -  Monitor  # A.fib on xarelo [Dr.Khan]-monitor closely for anemia- stable  IV access:  # DISPOSITION: # venofer   today # follow up 4  month- MD; labs- cbc/bmp;LDH; iron  studies; ferritin- possible venofer - Dr.B     All questions were answered. The patient knows to call the clinic with any problems, questions or concerns.    Cindy JONELLE Joe, MD 12/15/2023 2:36 PM

## 2023-12-15 NOTE — Assessment & Plan Note (Signed)
#   Iron  deficiency anemia- Hb 8 [PCP-NOV 2024-ferritin-12; I sat-10]-symptomatic. Etiology GI blood loss-?  Hemorrhoidal -/patient on Xarelto . Continue Vitron C  # s/p venofer  -  Hb 13.2 - proceed with venofer  today.   # Etiology of iron  deficiency: Unclear;-  s/p previous d GI evaluation-EGD colonoscopy; /capsule study [2019]- [facility]- awaiting hemorridal  surgery with Dr.Toledo in AUG 2025.   # Mild intermittent thrombocytopenia platelets greater than 100 stable. -  Monitor  # A.fib on xarelo [Dr.Khan]-monitor closely for anemia- stable  IV access:  # DISPOSITION: # venofer   today # follow up 4  month- MD; labs- cbc/bmp;LDH; iron  studies; ferritin- possible venofer - Dr.B

## 2023-12-15 NOTE — Progress Notes (Signed)
 Fatigue/weakness: NO Dyspena: NO  Light headedness: AT TIMES Blood in stool: YES FROM HEMORRHOIDS SURGERY NEXT WEEK BR. DR. AUNDRIA.

## 2023-12-18 ENCOUNTER — Ambulatory Visit: Payer: Self-pay | Admitting: Internal Medicine

## 2023-12-18 ENCOUNTER — Ambulatory Visit (INDEPENDENT_AMBULATORY_CARE_PROVIDER_SITE_OTHER): Admitting: Internal Medicine

## 2023-12-18 ENCOUNTER — Encounter: Payer: Self-pay | Admitting: Internal Medicine

## 2023-12-18 VITALS — BP 128/70 | HR 64 | Ht 70.0 in | Wt 254.2 lb

## 2023-12-18 DIAGNOSIS — E1169 Type 2 diabetes mellitus with other specified complication: Secondary | ICD-10-CM | POA: Diagnosis not present

## 2023-12-18 DIAGNOSIS — J301 Allergic rhinitis due to pollen: Secondary | ICD-10-CM | POA: Diagnosis not present

## 2023-12-18 DIAGNOSIS — E782 Mixed hyperlipidemia: Secondary | ICD-10-CM | POA: Diagnosis not present

## 2023-12-18 DIAGNOSIS — N401 Enlarged prostate with lower urinary tract symptoms: Secondary | ICD-10-CM

## 2023-12-18 DIAGNOSIS — Z0000 Encounter for general adult medical examination without abnormal findings: Secondary | ICD-10-CM

## 2023-12-18 DIAGNOSIS — Z0001 Encounter for general adult medical examination with abnormal findings: Secondary | ICD-10-CM

## 2023-12-18 DIAGNOSIS — E119 Type 2 diabetes mellitus without complications: Secondary | ICD-10-CM

## 2023-12-18 DIAGNOSIS — G629 Polyneuropathy, unspecified: Secondary | ICD-10-CM | POA: Diagnosis not present

## 2023-12-18 DIAGNOSIS — G4733 Obstructive sleep apnea (adult) (pediatric): Secondary | ICD-10-CM | POA: Diagnosis not present

## 2023-12-18 DIAGNOSIS — I480 Paroxysmal atrial fibrillation: Secondary | ICD-10-CM | POA: Diagnosis not present

## 2023-12-18 DIAGNOSIS — E1159 Type 2 diabetes mellitus with other circulatory complications: Secondary | ICD-10-CM | POA: Diagnosis not present

## 2023-12-18 DIAGNOSIS — I152 Hypertension secondary to endocrine disorders: Secondary | ICD-10-CM

## 2023-12-18 LAB — POC CREATINE & ALBUMIN,URINE
Creatinine, POC: 300 mg/dL
Microalbumin Ur, POC: 150 mg/L

## 2023-12-18 LAB — POCT CBG (FASTING - GLUCOSE)-MANUAL ENTRY: Glucose Fasting, POC: 158 mg/dL — AB (ref 70–99)

## 2023-12-18 MED ORDER — MONTELUKAST SODIUM 10 MG PO TABS: 10.0000 mg | ORAL_TABLET | Freq: Every day | ORAL | 2 refills | Status: AC

## 2023-12-18 MED ORDER — GABAPENTIN 100 MG PO CAPS: 100.0000 mg | ORAL_CAPSULE | Freq: Every day | ORAL | 2 refills | Status: DC

## 2023-12-18 NOTE — Progress Notes (Signed)
 Established Patient Office Visit  Subjective:  Patient ID: Brandon Gibson, male    DOB: 1950-12-16  Age: 73 y.o. MRN: [id number]  Chief Complaint  Patient presents with   Annual Exam    AWV, 4 month follow up    Is in for his annual wellness visit today.  He is generally feeling well but complains of nasal and sinus allergies.  He is currently on Claritin  and Flonase nasal spray.  He reports that the Flonase causes nasal bleeding so he does not want to use it every day.  Advised to use saline nasal spray in between his doses.  Can also switch to Allegra.  Will also start him on Singulair  since his allergies are getting worse. Patient has history of chronic neuropathy, but is getting worse now.  Agrees to start gabapentin  100 mg at bedtime. Denies any chest pain, no shortness of breath no palpitations.  He is scheduled for colonoscopy. His PHQ-9/GAD score is 1/0. CFS is 0. He gets regular diabetic eye exam. Urine microalbumin done today.   Patient notes that the punch biopsy site from dermatologist at the back of his right calf has a central black area, looks like a healing biopsy site with no redness or tenderness around it.  Patient advised to continue to monitor and let us  know if anything changes.    No other concerns at this time.   Past Medical History:  Diagnosis Date   Arthritis    Atrial fibrillation (HCC)    BPH (benign prostatic hyperplasia)    Cancer (HCC)    HX SKIN CANCER Basal cell   Chickenpox    Clotting disorder (HCC)    Diabetes mellitus without complication (HCC)    type 2   Dysrhythmia    IRREG HEART BEAT   GERD (gastroesophageal reflux disease)    H/O pleurisy    Hypercholesteremia    Hyperlipidemia    Hypertension    Iron  deficiency anemia due to chronic blood loss 09/18/2017   Lumbar stenosis    Measles    Mumps    Sleep apnea    sleep study Dr. Deretha, uses CPAP    Past Surgical History:  Procedure Laterality Date   ANTERIOR CERVICAL  DECOMP/DISCECTOMY FUSION  11/25/2011   Procedure: ANTERIOR CERVICAL DECOMPRESSION/DISCECTOMY FUSION 2 LEVELS;  Surgeon: Catalina CHRISTELLA Stains, MD;  Location: [facility];  Service: Neurosurgery;  Laterality: N/A;  Cervical four-five,Cervical five-six  Anterior cervical decompression/diskectomy, fusion, plate   BREAST BIOPSY Right    Benign   BREAST SURGERY Left 1986   lumpectomy   CARDIAC CATHETERIZATION     2011, [facility]   CARDIOVASCULAR STRESS TEST  2011   CERVICAL FUSION  1988   C 6/7    COLONOSCOPY WITH PROPOFOL  N/A 11/08/2017   Procedure: COLONOSCOPY WITH PROPOFOL ;  Surgeon: Toledo, Ladell POUR, MD;  Location: [facility];  Service: Gastroenterology;  Laterality: N/A;   ESOPHAGOGASTRODUODENOSCOPY (EGD) WITH PROPOFOL  N/A 11/08/2017   Procedure: ESOPHAGOGASTRODUODENOSCOPY (EGD) WITH PROPOFOL ;  Surgeon: Toledo, Ladell POUR, MD;  Location: [facility];  Service: Gastroenterology;  Laterality: N/A;   EYE SURGERY     LASIK   KNEE ARTHROPLASTY Right 10/05/2015   Procedure: COMPUTER ASSISTED TOTAL KNEE ARTHROPLASTY;  Surgeon: Lynwood SHAUNNA Hue, MD;  Location: [facility];  Service: Orthopedics;  Laterality: Right;   KNEE ARTHROSCOPY  1986   Right   KNEE ARTHROSCOPY Left 08/10/2015   Procedure: LEFT KNEE ARTHROSCOPY, CHONDROPLASTY, MEDIAL MENISECTOMY;  Surgeon: Lynwood SHAUNNA Hue, MD;  Location: [facility];  Service: Orthopedics;  Laterality: Left;   LAMINECTOMY WITH POSTERIOR LATERAL ARTHRODESIS LEVEL 2 N/A 07/16/2018   Procedure: Posterior lumbar fusion with instrumentation at L3-4 with repeat facetectomy L3-4;  Surgeon: Joshua Alm RAMAN, MD;  Location: [facility];  Service: Neurosurgery;  Laterality: N/A;  Posterior lumbar fusion with instrumentation at L3-4 with repeat facetectomy L3-4   LUMBAR LAMINECTOMY/DECOMPRESSION MICRODISCECTOMY N/A 10/04/2013   Procedure: LUMBAR TWO TO THREE LUMBAR LAMINECTOMY/DECOMPRESSION MICRODISCECTOMY 1 LEVEL;  Surgeon: Catalina CHRISTELLA Stains, MD;  Location: [facility];  Service:  Neurosurgery;  Laterality: N/A;  L2-3 Laminectomy   POSTERIOR LAMINECTOMY / DECOMPRESSION LUMBAR SPINE  2006   TRANSESOPHAGEAL ECHOCARDIOGRAM  2011    Social History   Socioeconomic History   Marital status: Widowed    Spouse name: Not on file   Number of children: Not on file   Years of education: Not on file   Highest education level: Not on file  Occupational History   Not on file  Tobacco Use   Smoking status: Never   Smokeless tobacco: Never  Vaping Use   Vaping status: Never Used  Substance and Sexual Activity   Alcohol  use: Yes    Alcohol /week: 14.0 standard drinks of alcohol     Types: 14 Glasses of wine per week   Drug use: No   Sexual activity: Not on file  Other Topics Concern   Not on file  Social History Narrative   Not on file   Social Drivers of Health   Financial Resource Strain: Low Risk  (11/29/2023)   Received from [facility]   Overall Financial Resource Strain (CARDIA)    Difficulty of Paying Living Expenses: Not hard at all  Food Insecurity: No Food Insecurity (11/29/2023)   Received from [facility]   Hunger Vital Sign    Within the past 12 months, you worried that your food would run out before you got the money to buy more.: Never true    Within the past 12 months, the food you bought just didn't last and you didn't have money to get more.: Never true  Transportation Needs: No Transportation Needs (11/29/2023)   [facility] - Transportation    In the past 12 months, has lack of transportation kept you from medical appointments or from getting medications?: No    Lack of Transportation (Non-Medical): No  Physical Activity: Not on file  Stress: No Stress Concern Present (12/18/2023)   [employer] [facility] [employer] - Occupational Stress Questionnaire    Feeling of Stress: Not at all  Social Connections: Not on file  Intimate Partner Violence: Not At Risk  (03/16/2023)   Humiliation, Afraid, Rape, and Kick questionnaire    Fear of Current or Ex-Partner: No    Emotionally Abused: No    Physically Abused: No    Sexually Abused: No    Family History  Problem Relation Age of Onset   Leukemia Father    Diabetes Father    Heart disease Mother    Prostate cancer Neg Hx    Chronic Renal Failure Neg Hx    Breast cancer Neg Hx     Allergies  Allergen Reactions   Morphine  And Codeine Anaphylaxis   Ace Inhibitors Swelling    Other reaction(s): Unknown   Diazepam     Tape     Outpatient Medications Prior to Visit  Medication Sig   ACCU-CHEK AVIVA PLUS test strip TEST BLOOD  SUGAR EVERY DAY   amiodarone  (PACERONE ) 200 MG tablet TAKE 1 TABLET EVERY DAY   baclofen  (LIORESAL ) 10 MG tablet Take 1 tablet (10 mg total) by mouth 2 (two) times daily. (Patient taking differently: Take 10 mg by mouth as needed.)   chlorthalidone (HYGROTON) 25 MG tablet TAKE 1 TABLET EVERY DAY   ELIQUIS  5 MG TABS tablet TAKE 1 TABLET TWICE DAILY   hydrALAZINE  (APRESOLINE ) 50 MG tablet TAKE 1 TABLET TWICE DAILY   Iron -Vitamin C  65-125 MG TABS Take 1 tablet by mouth 2 (two) times daily. (Patient taking differently: Take 1 tablet by mouth daily.)   loratadine  (CLARITIN ) 10 MG tablet Take 1 tablet (10 mg total) by mouth daily.   metFORMIN  (GLUCOPHAGE ) 500 MG tablet TAKE 1 TABLET TWICE DAILY   metoprolol  succinate (TOPROL -XL) 50 MG 24 hr tablet TAKE 1 TABLET EVERY DAY   omeprazole (PRILOSEC) 40 MG capsule TAKE 1 CAPSULE EVERY DAY 1/2 HOUR BEFORE A MEAL   potassium chloride  (KLOR-CON ) 10 MEQ tablet TAKE 1 TABLET EVERY DAY   rosuvastatin  (CRESTOR ) 20 MG tablet TAKE 1 TABLET EVERY DAY   tamsulosin  (FLOMAX ) 0.4 MG CAPS capsule Take 1 capsule (0.4 mg total) by mouth daily.   trimethoprim  (TRIMPEX ) 100 MG tablet Take 1 tablet (100 mg total) by mouth daily.   docusate (COLACE) 50 MG/5ML liquid Take by mouth daily. (Patient not taking: Reported on 12/18/2023)   fluticasone  (FLONASE) 50 MCG/ACT nasal spray Place 2 sprays into both nostrils daily. (Patient not taking: Reported on 12/18/2023)   No facility-administered medications prior to visit.    Review of Systems  Constitutional: Negative.  Negative for chills, diaphoresis, fever, malaise/fatigue and weight loss.  HENT:  Positive for congestion and sinus pain. Negative for ear discharge, nosebleeds and sore throat.   Eyes: Negative.   Respiratory: Negative.  Negative for cough and shortness of breath.   Cardiovascular: Negative.  Negative for chest pain, palpitations and leg swelling.  Gastrointestinal: Negative.  Negative for abdominal pain, constipation, diarrhea, heartburn, nausea and vomiting.  Genitourinary: Negative.  Negative for dysuria and flank pain.  Musculoskeletal: Negative.  Negative for joint pain and myalgias.  Skin: Negative.   Neurological:  Positive for tingling and sensory change. Negative for dizziness and headaches.  Endo/Heme/Allergies: Negative.   Psychiatric/Behavioral: Negative.  Negative for depression and suicidal ideas. The patient is not nervous/anxious.        Objective:   BP 128/70   Pulse 64   Ht 5' 10 (1.778 m)   Wt 254 lb 3.2 oz (115.3 kg)   SpO2 97%   BMI 36.47 kg/m   Vitals:   12/18/23 0935  BP: 128/70  Pulse: 64  Height: 5' 10 (1.778 m)  Weight: 254 lb 3.2 oz (115.3 kg)  SpO2: 97%  BMI (Calculated): 36.47    Physical Exam Vitals and nursing note reviewed.  Constitutional:      Appearance: Normal appearance.  HENT:     Head: Normocephalic and atraumatic.     Nose: Nose normal.     Mouth/Throat:     Mouth: Mucous membranes are moist.     Pharynx: Oropharynx is clear.  Eyes:     Conjunctiva/sclera: Conjunctivae normal.     Pupils: Pupils are equal, round, and reactive to light.  Cardiovascular:     Rate and Rhythm: Normal rate and regular rhythm.     Pulses: Normal pulses.     Heart sounds: Normal heart sounds.  Pulmonary:     Effort:  Pulmonary effort is normal.     Breath sounds: Normal breath sounds.  Abdominal:     General: Bowel sounds are normal.     Palpations: Abdomen is soft.  Musculoskeletal:        General: Normal range of motion.     Cervical back: Normal range of motion.  Skin:    General: Skin is warm and dry.  Neurological:     General: No focal deficit present.     Mental Status: He is alert and oriented to person, place, and time.  Psychiatric:        Mood and Affect: Mood normal.        Behavior: Behavior normal.        Judgment: Judgment normal.      Results for orders placed or performed in visit on 12/18/23  POCT CBG (Fasting - Glucose)  Result Value Ref Range   Glucose Fasting, POC 158 (A) 70 - 99 mg/dL  POC CREATINE & ALBUMIN,URINE  Result Value Ref Range   Microalbumin Ur, POC 150 mg/L   Creatinine, POC 300 mg/dL   Albumin/Creatinine Ratio, Urine, POC 30-300     Recent Results (from the past 2160 hours)  CMP14+EGFR     Status: Abnormal   Collection Time: 12/13/23 10:31 AM  Result Value Ref Range   Glucose 151 (H) 70 - 99 mg/dL   BUN 10 8 - 27 mg/dL   Creatinine, Ser 8.98 0.76 - 1.27 mg/dL   eGFR 78 >40 fO/fpw/8.26   BUN/Creatinine Ratio 10 10 - 24   Sodium 139 134 - 144 mmol/L   Potassium 3.5 3.5 - 5.2 mmol/L   Chloride 101 96 - 106 mmol/L   CO2 24 20 - 29 mmol/L   Calcium  9.0 8.6 - 10.2 mg/dL   Total Protein 5.9 (L) 6.0 - 8.5 g/dL   Albumin 4.2 3.8 - 4.8 g/dL   Globulin, Total 1.7 1.5 - 4.5 g/dL   Bilirubin Total 0.5 0.0 - 1.2 mg/dL   Alkaline Phosphatase 79 44 - 121 IU/L   AST 32 0 - 40 IU/L   ALT 40 0 - 44 IU/L  Lipid panel     Status: None   Collection Time: 12/13/23 10:31 AM  Result Value Ref Range   Cholesterol, Total 116 100 - 199 mg/dL   Triglycerides 879 0 - 149 mg/dL   HDL 61 >60 mg/dL   VLDL Cholesterol Cal 21 5 - 40 mg/dL   LDL Chol Calc (NIH) 34 0 - 99 mg/dL   Chol/HDL Ratio 1.9 0.0 - 5.0 ratio    Comment:                                   T.  Chol/HDL Ratio                                             Men  Women                               1/2 Avg.Risk  3.4    3.3  Avg.Risk  5.0    4.4                                2X Avg.Risk  9.6    7.1                                3X Avg.Risk 23.4   11.0   Hemoglobin A1c     Status: Abnormal   Collection Time: 12/13/23 10:31 AM  Result Value Ref Range   Hgb A1c MFr Bld 6.8 (H) 4.8 - 5.6 %    Comment:          Prediabetes: 5.7 - 6.4          Diabetes: >6.4          Glycemic control for adults with diabetes: <7.0    Est. average glucose Bld gHb Est-mCnc 148 mg/dL  TSH     Status: None   Collection Time: 12/13/23 10:31 AM  Result Value Ref Range   TSH 1.860 0.450 - 4.500 uIU/mL  Ferritin     Status: Abnormal   Collection Time: 12/15/23  1:30 PM  Result Value Ref Range   Ferritin 19 (L) 24 - 336 ng/mL    Comment: Performed at [facility], [address] [place], [place] [address]  Iron  and TIBC     Status: Abnormal   Collection Time: 12/15/23  1:30 PM  Result Value Ref Range   Iron  68 45 - 182 ug/dL   TIBC 512 (H) 749 - 549 ug/dL   Saturation Ratios 14 (L) 17.9 - 39.5 %   UIBC 419 ug/dL    Comment: Performed at [facility], [address] [place], [place] [address]  Lactate dehydrogenase     Status: None   Collection Time: 12/15/23  1:30 PM  Result Value Ref Range   LDH 121 98 - 192 U/L    Comment: Performed at [facility], [address] [place], [place] [address]  Basic metabolic panel with GFR     Status: Abnormal   Collection Time: 12/15/23  1:30 PM  Result Value Ref Range   Sodium 135 135 - 145 mmol/L   Potassium 3.5 3.5 - 5.1 mmol/L   Chloride 101 98 - 111 mmol/L   CO2 25 22 - 32 mmol/L   Glucose, Bld 188 (H) 70 - 99 mg/dL    Comment: Glucose reference range applies only to samples taken after fasting for at least 8 hours.   BUN 14 8 - 23 mg/dL   Creatinine, Ser 8.95 0.61 - 1.24 mg/dL   Calcium  9.1  8.9 - 10.3 mg/dL   GFR, Estimated >39 >39 mL/min    Comment: (NOTE) Calculated using the CKD-EPI Creatinine Equation (2021)    Anion gap 9 5 - 15    Comment: Performed at [facility], [address] [place], [place] [address]  CBC with Differential (Cancer Center Only)     Status: Abnormal   Collection Time: 12/15/23  1:30 PM  Result Value Ref Range   WBC Count 4.3 4.0 - 10.5 K/uL   RBC 4.43 4.22 - 5.81 MIL/uL   Hemoglobin 13.2 13.0 - 17.0 g/dL   HCT 60.6 60.9 - 47.9 %   MCV 88.7 80.0 - 100.0 fL   MCH 29.8 26.0 - 34.0 pg   MCHC 33.6 30.0 -  36.0 g/dL   RDW 85.4 88.4 - 84.4 %   Platelet Count 131 (L) 150 - 400 K/uL   nRBC 0.0 0.0 - 0.2 %   Neutrophils Relative % 65 %   Neutro Abs 2.8 1.7 - 7.7 K/uL   Lymphocytes Relative 21 %   Lymphs Abs 0.9 0.7 - 4.0 K/uL   Monocytes Relative 11 %   Monocytes Absolute 0.5 0.1 - 1.0 K/uL   Eosinophils Relative 1 %   Eosinophils Absolute 0.1 0.0 - 0.5 K/uL   Basophils Relative 1 %   Basophils Absolute 0.0 0.0 - 0.1 K/uL   Immature Granulocytes 1 %   Abs Immature Granulocytes 0.02 0.00 - 0.07 K/uL    Comment: Performed at [facility], [address] [place], [place] [address]  POCT CBG (Fasting - Glucose)     Status: Abnormal   Collection Time: 12/18/23  9:44 AM  Result Value Ref Range   Glucose Fasting, POC 158 (A) 70 - 99 mg/dL  POC CREATINE & ALBUMIN,URINE     Status: Abnormal   Collection Time: 12/18/23 10:46 AM  Result Value Ref Range   Microalbumin Ur, POC 150 mg/L   Creatinine, POC 300 mg/dL   Albumin/Creatinine Ratio, Urine, POC 30-300       Assessment & Plan:  Continue current medications.  Monitor biopsy site closely.  Add Singulair  and gabapentin . Problem List Items Addressed This Visit     Diabetes mellitus type 2, uncomplicated (HCC)   Relevant Orders   POCT CBG (Fasting - Glucose) (Completed)   POC CREATINE & ALBUMIN,URINE (Completed)   Obstructive sleep apnea   Hypertension associated with diabetes  (HCC)   Seasonal allergic rhinitis due to pollen   Relevant Medications   montelukast  (SINGULAIR ) 10 MG tablet   Combined hyperlipidemia associated with type 2 diabetes mellitus (HCC)   Other Visit Diagnoses       Encounter for subsequent annual wellness visit (AWV) in Medicare patient    -  Primary     Neuropathy       Relevant Medications   gabapentin  (NEURONTIN ) 100 MG capsule     Paroxysmal A-fib (HCC)           Return in about 4 months (around 04/18/2024).   Total time spent: 30 minutes  FERNAND FREDY RAMAN, MD  12/18/2023   This document may have been prepared by [facility] Voice Recognition software and as such may include unintentional dictation errors.

## 2023-12-19 ENCOUNTER — Encounter: Payer: Self-pay | Admitting: Cardiovascular Disease

## 2023-12-19 ENCOUNTER — Ambulatory Visit: Admitting: Cardiovascular Disease

## 2023-12-19 VITALS — BP 130/70 | HR 60 | Ht 68.0 in | Wt 254.8 lb

## 2023-12-19 DIAGNOSIS — I10 Essential (primary) hypertension: Secondary | ICD-10-CM | POA: Diagnosis not present

## 2023-12-19 DIAGNOSIS — Z8679 Personal history of other diseases of the circulatory system: Secondary | ICD-10-CM | POA: Diagnosis not present

## 2023-12-19 DIAGNOSIS — E782 Mixed hyperlipidemia: Secondary | ICD-10-CM | POA: Diagnosis not present

## 2023-12-19 DIAGNOSIS — E1159 Type 2 diabetes mellitus with other circulatory complications: Secondary | ICD-10-CM

## 2023-12-19 DIAGNOSIS — I152 Hypertension secondary to endocrine disorders: Secondary | ICD-10-CM | POA: Diagnosis not present

## 2023-12-19 DIAGNOSIS — R0602 Shortness of breath: Secondary | ICD-10-CM | POA: Diagnosis not present

## 2023-12-19 NOTE — Progress Notes (Signed)
 Cardiology Office Note   Date:  12/19/2023   ID:  Brandon, Gibson December 30, 1950, MRN [id number]  PCP:  Brandon Fredy RAMAN, MD  Cardiologist:  Brandon Fernand, MD      History of Present Illness: Brandon Gibson is a 73 y.o. male who presents for  Chief Complaint  Patient presents with   Follow-up    3 month follow up    Patient doing well.      Past Medical History:  Diagnosis Date   Arthritis    Atrial fibrillation (HCC)    BPH (benign prostatic hyperplasia)    Cancer (HCC)    HX SKIN CANCER Basal cell   Chickenpox    Clotting disorder (HCC)    Diabetes mellitus without complication (HCC)    type 2   Dysrhythmia    IRREG HEART BEAT   GERD (gastroesophageal reflux disease)    H/O pleurisy    Hypercholesteremia    Hyperlipidemia    Hypertension    Iron  deficiency anemia due to chronic blood loss 09/18/2017   Lumbar stenosis    Measles    Mumps    Sleep apnea    sleep study Dr. Deretha, uses CPAP     Past Surgical History:  Procedure Laterality Date   ANTERIOR CERVICAL DECOMP/DISCECTOMY FUSION  11/25/2011   Procedure: ANTERIOR CERVICAL DECOMPRESSION/DISCECTOMY FUSION 2 LEVELS;  Surgeon: Brandon CHRISTELLA Stains, MD;  Location: [facility];  Service: Neurosurgery;  Laterality: N/A;  Cervical four-five,Cervical five-six  Anterior cervical decompression/diskectomy, fusion, plate   BREAST BIOPSY Right    Benign   BREAST SURGERY Left 1986   lumpectomy   CARDIAC CATHETERIZATION     2011, [facility]   CARDIOVASCULAR STRESS TEST  2011   CERVICAL FUSION  1988   C 6/7    COLONOSCOPY WITH PROPOFOL  N/A 11/08/2017   Procedure: COLONOSCOPY WITH PROPOFOL ;  Surgeon: Gibson, Brandon POUR, MD;  Location: [facility];  Service: Gastroenterology;  Laterality: N/A;   ESOPHAGOGASTRODUODENOSCOPY (EGD) WITH PROPOFOL  N/A 11/08/2017   Procedure: ESOPHAGOGASTRODUODENOSCOPY (EGD) WITH PROPOFOL ;  Surgeon: Gibson, Brandon POUR, MD;  Location: [facility];  Service: Gastroenterology;   Laterality: N/A;   EYE SURGERY     LASIK   KNEE ARTHROPLASTY Right 10/05/2015   Procedure: COMPUTER ASSISTED TOTAL KNEE ARTHROPLASTY;  Surgeon: Lynwood Brandon Hue, MD;  Location: [facility];  Service: Orthopedics;  Laterality: Right;   KNEE ARTHROSCOPY  1986   Right   KNEE ARTHROSCOPY Left 08/10/2015   Procedure: LEFT KNEE ARTHROSCOPY, CHONDROPLASTY, MEDIAL MENISECTOMY;  Surgeon: Lynwood Brandon Hue, MD;  Location: [facility];  Service: Orthopedics;  Laterality: Left;   LAMINECTOMY WITH POSTERIOR LATERAL ARTHRODESIS LEVEL 2 N/A 07/16/2018   Procedure: Posterior lumbar fusion with instrumentation at L3-4 with repeat facetectomy L3-4;  Surgeon: Brandon Alm RAMAN, MD;  Location: [facility] OR;  Service: Neurosurgery;  Laterality: N/A;  Posterior lumbar fusion with instrumentation at L3-4 with repeat facetectomy L3-4   LUMBAR LAMINECTOMY/DECOMPRESSION MICRODISCECTOMY N/A 10/04/2013   Procedure: LUMBAR TWO TO THREE LUMBAR LAMINECTOMY/DECOMPRESSION MICRODISCECTOMY 1 LEVEL;  Surgeon: Brandon CHRISTELLA Stains, MD;  Location: [facility];  Service: Neurosurgery;  Laterality: N/A;  L2-3 Laminectomy   POSTERIOR LAMINECTOMY / DECOMPRESSION LUMBAR SPINE  2006   TRANSESOPHAGEAL ECHOCARDIOGRAM  2011     Current Outpatient Medications  Medication Sig Dispense Refill   ACCU-CHEK AVIVA PLUS test strip TEST BLOOD SUGAR EVERY DAY 100 strip 3   amiodarone  (PACERONE ) 200 MG tablet TAKE 1 TABLET EVERY DAY 90  tablet 0   baclofen  (LIORESAL ) 10 MG tablet Take 1 tablet (10 mg total) by mouth 2 (two) times daily. (Patient taking differently: Take 10 mg by mouth as needed.) 60 tablet 1   chlorthalidone (HYGROTON) 25 MG tablet TAKE 1 TABLET EVERY DAY 90 tablet 3   docusate (COLACE) 50 MG/5ML liquid Take by mouth daily. (Patient taking differently: Take by mouth as needed.)     ELIQUIS  5 MG TABS tablet TAKE 1 TABLET TWICE DAILY 180 tablet 3   fluticasone (FLONASE) 50 MCG/ACT nasal spray Place 2 sprays into both nostrils daily. (Patient taking  differently: Place 2 sprays into both nostrils as needed.)     gabapentin  (NEURONTIN ) 100 MG capsule Take 1 capsule (100 mg total) by mouth daily. 30 capsule 2   hydrALAZINE  (APRESOLINE ) 50 MG tablet TAKE 1 TABLET TWICE DAILY 180 tablet 3   Iron -Vitamin C  65-125 MG TABS Take 1 tablet by mouth 2 (two) times daily. 180 tablet 1   loratadine  (CLARITIN ) 10 MG tablet Take 1 tablet (10 mg total) by mouth daily. (Patient taking differently: Take 10 mg by mouth as needed.) 90 tablet 0   metFORMIN  (GLUCOPHAGE ) 500 MG tablet TAKE 1 TABLET TWICE DAILY 180 tablet 3   metoprolol  succinate (TOPROL -XL) 50 MG 24 hr tablet TAKE 1 TABLET EVERY DAY 90 tablet 0   montelukast  (SINGULAIR ) 10 MG tablet Take 1 tablet (10 mg total) by mouth daily. 90 tablet 2   omeprazole (PRILOSEC) 40 MG capsule TAKE 1 CAPSULE EVERY DAY 1/2 HOUR BEFORE A MEAL 90 capsule 0   potassium chloride  (KLOR-CON ) 10 MEQ tablet TAKE 1 TABLET EVERY DAY 90 tablet 3   rosuvastatin  (CRESTOR ) 20 MG tablet TAKE 1 TABLET EVERY DAY 90 tablet 3   tamsulosin  (FLOMAX ) 0.4 MG CAPS capsule Take 1 capsule (0.4 mg total) by mouth daily. 90 capsule 3   trimethoprim  (TRIMPEX ) 100 MG tablet Take 1 tablet (100 mg total) by mouth daily. 90 tablet 3   No current facility-administered medications for this visit.    Allergies:   Morphine  and codeine, Ace inhibitors, Diazepam , and Tape    Social History:   reports that he has never smoked. He has never used smokeless tobacco. He reports current alcohol  use of about 14.0 standard drinks of alcohol  per week. He reports that he does not use drugs.   Family History:  family history includes Diabetes in his father; Heart disease in his mother; Leukemia in his father.    ROS:     Review of Systems  Constitutional: Negative.   HENT: Negative.    Eyes: Negative.   Respiratory: Negative.    Gastrointestinal: Negative.   Genitourinary: Negative.   Musculoskeletal: Negative.   Skin: Negative.   Neurological:  Negative.   Endo/Heme/Allergies: Negative.   Psychiatric/Behavioral: Negative.    All other systems reviewed and are negative.     All other systems are reviewed and negative.    PHYSICAL EXAM: VS:  BP 130/70   Pulse 60   Ht 5' 8 (1.727 m)   Wt 254 lb 12.8 oz (115.6 kg)   SpO2 97%   BMI 38.74 kg/m  , BMI Body mass index is 38.74 kg/m. Last weight:  Wt Readings from Last 3 Encounters:  12/19/23 254 lb 12.8 oz (115.6 kg)  12/18/23 254 lb 3.2 oz (115.3 kg)  12/15/23 255 lb 12.8 oz (116 kg)     Physical Exam    EKG:   Recent Labs: 12/13/2023: ALT 40; TSH 1.860  12/15/2023: BUN 14; Creatinine, Ser 1.04; Hemoglobin 13.2; Platelet Count 131; Potassium 3.5; Sodium 135    Lipid Panel    Component Value Date/Time   CHOL 116 12/13/2023 1031   TRIG 120 12/13/2023 1031   HDL 61 12/13/2023 1031   CHOLHDL 1.9 12/13/2023 1031   LDLCALC 34 12/13/2023 1031      Other studies Reviewed: Additional studies/ records that were reviewed today include:  Review of the above records demonstrates:       No data to display            ASSESSMENT AND PLAN:    ICD-10-CM   1. Essential hypertension, benign  I10    stable    2. Hypertension associated with diabetes (HCC)  E11.59    I15.2     3. Shortness of breath  R06.02     4. Mixed hyperlipidemia  E78.2     5. Atrial fibrillation, currently in sinus rhythm  Z86.79    remains in NSR       Problem List Items Addressed This Visit       Cardiovascular and Mediastinum   Essential hypertension, benign - Primary   Hypertension associated with diabetes (HCC)     Other   Hyperlipidemia   Shortness of breath   Other Visit Diagnoses       Atrial fibrillation, currently in sinus rhythm       remains in NSR          Disposition:   Return in about 3 months (around 03/20/2024).    Total time spent: 30 minutes  Signed,  Brandon Bathe, MD  12/19/2023 9:20 AM    [facility]

## 2023-12-25 ENCOUNTER — Encounter: Payer: Self-pay | Admitting: Internal Medicine

## 2023-12-26 ENCOUNTER — Other Ambulatory Visit: Payer: Self-pay

## 2023-12-26 ENCOUNTER — Encounter: Payer: Self-pay | Admitting: Internal Medicine

## 2023-12-26 ENCOUNTER — Ambulatory Visit: Admitting: Anesthesiology

## 2023-12-26 ENCOUNTER — Ambulatory Visit
Admission: RE | Admit: 2023-12-26 | Discharge: 2023-12-26 | Disposition: A | Discharge status: Home / Self Care | Attending: Internal Medicine | Admitting: Internal Medicine

## 2023-12-26 ENCOUNTER — Encounter
Admission: RE | Disposition: A | Discharge status: Home / Self Care | Payer: Self-pay | Source: Home / Self Care | Attending: Internal Medicine

## 2023-12-26 DIAGNOSIS — K5730 Diverticulosis of large intestine without perforation or abscess without bleeding: Secondary | ICD-10-CM | POA: Insufficient documentation

## 2023-12-26 DIAGNOSIS — K635 Polyp of colon: Secondary | ICD-10-CM | POA: Diagnosis not present

## 2023-12-26 DIAGNOSIS — G4730 Sleep apnea, unspecified: Secondary | ICD-10-CM | POA: Insufficient documentation

## 2023-12-26 DIAGNOSIS — K642 Third degree hemorrhoids: Secondary | ICD-10-CM | POA: Insufficient documentation

## 2023-12-26 DIAGNOSIS — E119 Type 2 diabetes mellitus without complications: Secondary | ICD-10-CM | POA: Insufficient documentation

## 2023-12-26 DIAGNOSIS — D123 Benign neoplasm of transverse colon: Secondary | ICD-10-CM | POA: Diagnosis not present

## 2023-12-26 DIAGNOSIS — K921 Melena: Secondary | ICD-10-CM | POA: Insufficient documentation

## 2023-12-26 DIAGNOSIS — K5900 Constipation, unspecified: Secondary | ICD-10-CM | POA: Insufficient documentation

## 2023-12-26 DIAGNOSIS — Z7901 Long term (current) use of anticoagulants: Secondary | ICD-10-CM | POA: Diagnosis not present

## 2023-12-26 DIAGNOSIS — K648 Other hemorrhoids: Secondary | ICD-10-CM | POA: Diagnosis not present

## 2023-12-26 DIAGNOSIS — I480 Paroxysmal atrial fibrillation: Secondary | ICD-10-CM | POA: Diagnosis not present

## 2023-12-26 DIAGNOSIS — K644 Residual hemorrhoidal skin tags: Secondary | ICD-10-CM | POA: Diagnosis not present

## 2023-12-26 DIAGNOSIS — D509 Iron deficiency anemia, unspecified: Secondary | ICD-10-CM | POA: Diagnosis not present

## 2023-12-26 DIAGNOSIS — Z7984 Long term (current) use of oral hypoglycemic drugs: Secondary | ICD-10-CM | POA: Diagnosis not present

## 2023-12-26 DIAGNOSIS — D500 Iron deficiency anemia secondary to blood loss (chronic): Secondary | ICD-10-CM | POA: Diagnosis not present

## 2023-12-26 DIAGNOSIS — I4891 Unspecified atrial fibrillation: Secondary | ICD-10-CM | POA: Diagnosis not present

## 2023-12-26 DIAGNOSIS — I10 Essential (primary) hypertension: Secondary | ICD-10-CM | POA: Diagnosis not present

## 2023-12-26 HISTORY — PX: COLONOSCOPY: SHX5424

## 2023-12-26 HISTORY — DX: Melena: K92.1

## 2023-12-26 HISTORY — PX: POLYPECTOMY: SHX149

## 2023-12-26 HISTORY — DX: Other hemorrhoids: K64.8

## 2023-12-26 HISTORY — DX: Other constipation: K59.09

## 2023-12-26 LAB — GLUCOSE, CAPILLARY: Glucose-Capillary: 206 mg/dL — ABNORMAL HIGH (ref 70–99)

## 2023-12-26 SURGERY — COLONOSCOPY
Anesthesia: General

## 2023-12-26 MED ORDER — PROPOFOL 10 MG/ML IV BOLUS
INTRAVENOUS | Status: DC | PRN
  Administered 2023-12-26: 100 ug/kg/min via INTRAVENOUS
  Administered 2023-12-26: 70 mg via INTRAVENOUS
  Administered 2023-12-26: 30 mg via INTRAVENOUS

## 2023-12-26 MED ORDER — SODIUM CHLORIDE 0.9 % IV SOLN: INTRAVENOUS | Status: DC

## 2023-12-26 MED ORDER — LIDOCAINE HCL (CARDIAC) PF 100 MG/5ML IV SOSY
PREFILLED_SYRINGE | INTRAVENOUS | Status: DC | PRN
  Administered 2023-12-26: 80 mg via INTRAVENOUS

## 2023-12-26 MED ORDER — LIDOCAINE HCL (PF) 2 % IJ SOLN
INTRAMUSCULAR | Status: AC
  Filled 2023-12-26: qty 5

## 2023-12-26 NOTE — Anesthesia Postprocedure Evaluation (Signed)
 Anesthesia Post Note  Patient: RONEY YOUTZ Biljon  Procedure(s) Performed: COLONOSCOPY POLYPECTOMY, INTESTINE  Patient location during evaluation: Endoscopy Anesthesia Type: General Level of consciousness: awake and alert Pain management: pain level controlled Vital Signs Assessment: post-procedure vital signs reviewed and stable Respiratory status: spontaneous breathing, nonlabored ventilation and respiratory function stable Cardiovascular status: blood pressure returned to baseline and stable Postop Assessment: no apparent nausea or vomiting Anesthetic complications: no   No notable events documented.   Last Vitals:  Vitals:   12/26/23 1241 12/26/23 1244  BP: (!) 142/99   Pulse: 66 (!) 58  Resp: 19 11  Temp: (!) 35.9 C   SpO2: 99% 100%    Last Pain:  Vitals:   12/26/23 1241  TempSrc: Temporal  PainSc: 0-No pain                 Camellia Merilee Louder

## 2023-12-26 NOTE — Transfer of Care (Signed)
 Immediate Anesthesia Transfer of Care Note  Patient: Brandon Gibson Biljon  Procedure(s) Performed: COLONOSCOPY POLYPECTOMY, INTESTINE  Patient Location: PACU and Endoscopy Unit  Anesthesia Type:General  Level of Consciousness: awake, drowsy, and patient cooperative  Airway & Oxygen Therapy: Patient Spontanous Breathing  Post-op Assessment: Report given to RN, Post -op Vital signs reviewed and stable, and Patient moving all extremities  Post vital signs: Reviewed and stable  Last Vitals:  Vitals Value Taken Time  BP 108/83 12/26/23 12:14  Temp 35.8 C 12/26/23 12:14  Pulse 63 12/26/23 12:18  Resp 15 12/26/23 12:18  SpO2 97 % 12/26/23 12:18  Vitals shown include unfiled device data.  Last Pain:  Vitals:   12/26/23 1214  TempSrc: Temporal  PainSc: 0-No pain         Complications: No notable events documented.

## 2023-12-26 NOTE — H&P (Signed)
 Outpatient short stay form Pre-procedure 12/26/2023 11:41 AM Catalia Massett K. Aundria, M.D.  Primary Physician: Fredy Bathe, M.D.  Reason for visit:  Hemorrhoids, rectal bleeding, iron  deficiency anemia  History of present illness:  Mr. Brandon Gibson is a pleasant 73 year old male with whom I am familiar from having seenfor iron  deficiency an in the past. [place] and having performed EGD and colonoscopy (11/08/2017): EGD: Minimal gastritis, H. pylori negative, along with numerous hyperplastic polyps of the stomach. Normal duodenal biopsies, negative for celiac disease. Colonoscopy: Multiple benign polyps. Internal hemorrhoids  Patient today complains of several months of intermittent perianal discomfort along with recurrent hematochezia. He has also been seen in the past for iron  deficiency anemia and was ordered a capsule endoscopy but I do not see where that was completed.  Patient does admit to some mild constipation with mild straining. He has used Preparation H and other over-the-counter preparations for several years. Bleeding has become more frequent now and he says it is 99% of the time. He denies any involuntary weight loss, abdominal pain, severe GERD symptoms, nausea or vomiting. No dysphagia. No hematemesis. Blood is colored bright red and he believes it is coming from hemorrhoids. Patient takes Eliquis  for history of paroxysmal atrial fibrillation.     Current Facility-Administered Medications:    0.9 %  sodium chloride  infusion, , Intravenous, Continuous, Adams, Kory Rains K, MD, Last Rate: 20 mL/hr at 12/26/23 1105, New Bag at 12/26/23 1105  Medications Prior to Admission  Medication Sig Dispense Refill Last Dose/Taking   amiodarone  (PACERONE ) 200 MG tablet TAKE 1 TABLET EVERY DAY 90 tablet 0 12/26/2023   chlorthalidone (HYGROTON) 25 MG tablet TAKE 1 TABLET EVERY DAY 90 tablet 3 12/26/2023   ELIQUIS  5 MG TABS tablet TAKE 1 TABLET TWICE DAILY 180 tablet 3 Past Week   hydrALAZINE  (APRESOLINE )  50 MG tablet TAKE 1 TABLET TWICE DAILY 180 tablet 3 12/26/2023   Iron -Vitamin C  65-125 MG TABS Take 1 tablet by mouth 2 (two) times daily. 180 tablet 1 Past Week   metFORMIN  (GLUCOPHAGE ) 500 MG tablet TAKE 1 TABLET TWICE DAILY 180 tablet 3 Past Week   metoprolol  succinate (TOPROL -XL) 50 MG 24 hr tablet TAKE 1 TABLET EVERY DAY 90 tablet 0 12/26/2023   omeprazole (PRILOSEC) 40 MG capsule TAKE 1 CAPSULE EVERY DAY 1/2 HOUR BEFORE A MEAL 90 capsule 0 12/26/2023   potassium chloride  (KLOR-CON ) 10 MEQ tablet TAKE 1 TABLET EVERY DAY 90 tablet 3 12/25/2023   rosuvastatin  (CRESTOR ) 20 MG tablet TAKE 1 TABLET EVERY DAY 90 tablet 3 12/26/2023   tamsulosin  (FLOMAX ) 0.4 MG CAPS capsule Take 1 capsule (0.4 mg total) by mouth daily. 90 capsule 3 12/26/2023   trimethoprim  (TRIMPEX ) 100 MG tablet Take 1 tablet (100 mg total) by mouth daily. 90 tablet 3 12/26/2023   ACCU-CHEK AVIVA PLUS test strip TEST BLOOD SUGAR EVERY DAY 100 strip 3    baclofen  (LIORESAL ) 10 MG tablet Take 1 tablet (10 mg total) by mouth 2 (two) times daily. (Patient taking differently: Take 10 mg by mouth as needed.) 60 tablet 1    docusate (COLACE) 50 MG/5ML liquid Take by mouth daily. (Patient taking differently: Take by mouth as needed.)      fluticasone (FLONASE) 50 MCG/ACT nasal spray Place 2 sprays into both nostrils daily. (Patient taking differently: Place 2 sprays into both nostrils as needed.)      gabapentin  (NEURONTIN ) 100 MG capsule Take 1 capsule (100 mg total) by mouth daily. 30 capsule 2    loratadine  (  CLARITIN ) 10 MG tablet Take 1 tablet (10 mg total) by mouth daily. (Patient taking differently: Take 10 mg by mouth as needed.) 90 tablet 0    montelukast  (SINGULAIR ) 10 MG tablet Take 1 tablet (10 mg total) by mouth daily. 90 tablet 2    SEMAGLUTIDE , 1 MG/DOSE, [place] Inject into the skin. (Patient not taking: Reported on 12/26/2023)   Not Taking     Allergies  Allergen Reactions   Morphine  And Codeine Anaphylaxis   Ace Inhibitors  Swelling    Other reaction(s): Unknown   Diazepam     Tape      Past Medical History:  Diagnosis Date   Arthritis    Atrial fibrillation (HCC)    BPH (benign prostatic hyperplasia)    Cancer (HCC)    HX SKIN CANCER Basal cell   Chickenpox    Chronic constipation    Clotting disorder (HCC)    Diabetes mellitus without complication (HCC)    type 2   Dysrhythmia    IRREG HEART BEAT   GERD (gastroesophageal reflux disease)    H/O pleurisy    Hematochezia    Hypercholesteremia    Hyperlipidemia    Hypertension    Iron  deficiency anemia due to chronic blood loss 09/18/2017   Lumbar stenosis    Measles    Mumps    Prolapsed internal hemorrhoids    Sleep apnea    sleep study Dr. Deretha, uses CPAP    Review of systems:  Otherwise negative.    Physical Exam  Gen: Alert, oriented. Appears stated age.  HEENT: [place]/AT. PERRLA. Lungs: CTA, no wheezes. CV: RR nl S1, S2. Abd: soft, benign, no masses. BS+ Ext: No edema. Pulses 2+    Planned procedures: Proceed with colonoscopy. The patient understands the nature of the planned procedure, indications, risks, alternatives and potential complications including but not limited to bleeding, infection, perforation, damage to internal organs and possible oversedation/side effects from anesthesia. The patient agrees and gives consent to proceed.  Please refer to procedure notes for findings, recommendations and patient disposition/instructions.     [place] K. Aundria, M.D. Gastroenterology 12/26/2023  11:41 AM

## 2023-12-26 NOTE — Anesthesia Preprocedure Evaluation (Addendum)
 Anesthesia Evaluation  Patient identified by MRN, date of birth, ID band Patient awake    Reviewed: Allergy & Precautions, H&P , NPO status , Patient's Chart, lab work & pertinent test results  Airway Mallampati: II  TM Distance: >3 FB Neck ROM: full    Dental no notable dental hx.    Pulmonary sleep apnea    Pulmonary exam normal        Cardiovascular hypertension, + dysrhythmias Atrial Fibrillation      Neuro/Psych negative neurological ROS  negative psych ROS   GI/Hepatic negative GI ROS,,,(+)     substance abuse  alcohol  use  Endo/Other  diabetes, Type 2    Renal/GU negative Renal ROS  negative genitourinary   Musculoskeletal   Abdominal  (+) + obese  Peds  Hematology  (+) Blood dyscrasia, anemia   Anesthesia Other Findings Past Medical History: No date: Arthritis No date: Atrial fibrillation (HCC) No date: BPH (benign prostatic hyperplasia) No date: Cancer (HCC)     Comment:  HX SKIN CANCER Basal cell No date: Chickenpox No date: Chronic constipation No date: Clotting disorder (HCC) No date: Diabetes mellitus without complication (HCC)     Comment:  type 2 No date: Dysrhythmia     Comment:  IRREG HEART BEAT No date: GERD (gastroesophageal reflux disease) No date: H/O pleurisy No date: Hematochezia No date: Hypercholesteremia No date: Hyperlipidemia No date: Hypertension 09/18/2017: Iron  deficiency anemia due to chronic blood loss No date: Lumbar stenosis No date: Measles No date: Mumps No date: Prolapsed internal hemorrhoids No date: Sleep apnea     Comment:  sleep study Dr. Deretha, uses CPAP  Past Surgical History: 11/25/2011: ANTERIOR CERVICAL DECOMP/DISCECTOMY FUSION     Comment:  Procedure: ANTERIOR CERVICAL DECOMPRESSION/DISCECTOMY               FUSION 2 LEVELS;  Surgeon: Catalina CHRISTELLA Stains, MD;                Location: [facility];  Service: Neurosurgery;                Laterality: N/A;   Cervical four-five,Cervical five-six                Anterior cervical decompression/diskectomy, fusion, plate No date: BREAST BIOPSY; Right     Comment:  Benign 1986: BREAST SURGERY; Left     Comment:  lumpectomy No date: CARDIAC CATHETERIZATION     Comment:  2011, [facility] 2011: CARDIOVASCULAR STRESS TEST 1988: CERVICAL FUSION     Comment:  C 6/7  11/08/2017: COLONOSCOPY WITH PROPOFOL ; N/A     Comment:  Procedure: COLONOSCOPY WITH PROPOFOL ;  Surgeon: [place],               Ladell POUR, MD;  Location: [facility] ENDOSCOPY;  Service:               Gastroenterology;  Laterality: N/A; 11/08/2017: ESOPHAGOGASTRODUODENOSCOPY (EGD) WITH PROPOFOL ; N/A     Comment:  Procedure: ESOPHAGOGASTRODUODENOSCOPY (EGD) WITH               PROPOFOL ;  Surgeon: Toledo, Ladell POUR, MD;  Location:               [facility] ENDOSCOPY;  Service: Gastroenterology;  Laterality:               N/A; No date: EYE SURGERY     Comment:  LASIK No date: JOINT REPLACEMENT 10/05/2015: KNEE ARTHROPLASTY; Right     Comment:  Procedure: COMPUTER ASSISTED TOTAL  KNEE ARTHROPLASTY;                Surgeon: Lynwood SHAUNNA Hue, MD;  Location: [facility] ORS;                Service: Orthopedics;  Laterality: Right; 1986: KNEE ARTHROSCOPY     Comment:  Right 08/10/2015: KNEE ARTHROSCOPY; Left     Comment:  Procedure: LEFT KNEE ARTHROSCOPY, CHONDROPLASTY, MEDIAL               MENISECTOMY;  Surgeon: Lynwood SHAUNNA Hue, MD;  Location:               [facility] ORS;  Service: Orthopedics;  Laterality: Left; 07/16/2018: LAMINECTOMY WITH POSTERIOR LATERAL ARTHRODESIS LEVEL 2; N/ A     Comment:  Procedure: Posterior lumbar fusion with instrumentation               at L3-4 with repeat facetectomy L3-4;  Surgeon: Joshua Alm RAMAN, MD;  Location: [facility] OR;  Service: Neurosurgery;                Laterality: N/A;  Posterior lumbar fusion with               instrumentation at L3-4 with repeat facetectomy L3-4 10/04/2013: LUMBAR LAMINECTOMY/DECOMPRESSION  MICRODISCECTOMY; N/A     Comment:  Procedure: LUMBAR TWO TO THREE LUMBAR               LAMINECTOMY/DECOMPRESSION MICRODISCECTOMY 1 LEVEL;                Surgeon: Catalina CHRISTELLA Stains, MD;  Location: [facility];                Service: Neurosurgery;  Laterality: N/A;  L2-3               Laminectomy 2006: POSTERIOR LAMINECTOMY / DECOMPRESSION LUMBAR SPINE 2011: TRANSESOPHAGEAL ECHOCARDIOGRAM     Reproductive/Obstetrics negative OB ROS                              Anesthesia Physical Anesthesia Plan  ASA: 3  Anesthesia Plan: General   Post-op Pain Management:    Induction:   PONV Risk Score and Plan: Propofol  infusion and TIVA  Airway Management Planned: Natural Airway  Additional Equipment:   Intra-op Plan:   Post-operative Plan:   Informed Consent: I have reviewed the patients History and Physical, chart, labs and discussed the procedure including the risks, benefits and alternatives for the proposed anesthesia with the patient or authorized representative who has indicated his/her understanding and acceptance.     Dental Advisory Given  Plan Discussed with: CRNA and Surgeon  Anesthesia Plan Comments:          Anesthesia Quick Evaluation

## 2023-12-26 NOTE — Interval H&P Note (Signed)
 History and Physical Interval Note:  12/26/2023 11:43 AM  Brandon Gibson  has presented today for surgery, with the diagnosis of D50.0 (ICD-10-CM) - Iron  deficiency anemia due to chronic blood loss K92.1 (ICD-10-CM) - Hematochezia.  The various methods of treatment have been discussed with the patient and family. After consideration of risks, benefits and other options for treatment, the patient has consented to  Procedure(s) with comments: COLONOSCOPY (N/A) - DM on Ozempic  and Eliquis  as a surgical intervention.  The patient's history has been reviewed, patient examined, no change in status, stable for surgery.  I have reviewed the patient's chart and labs.  Questions were answered to the patient's satisfaction.     [place], Lorilei Horan

## 2023-12-26 NOTE — Op Note (Addendum)
 [facility] Gastroenterology Patient Name: Brandon Gibson Procedure Date: 12/26/2023 11:44 AM MRN: [id number] Account #: [id number] Date of Birth: 03-Mar-1951 Admit Type: Outpatient Age: 73 Room: [facility] ENDO ROOM 1 Gender: Male Note Status: Supervisor Override Instrument Name: Colon Scope [id number] Procedure:             Colonoscopy Indications:           Hematochezia, Iron  deficiency anemia secondary to                         chronic blood loss, Hemorrhoids Providers:             Tya Haughey K. Aundria MD, MD Referring MD:          Fredy CANDIE Bathe, MD (Referring MD) Medicines:             Propofol  per Anesthesia Complications:         No immediate complications. Estimated blood loss: None. Procedure:             Pre-Anesthesia Assessment:                        - The risks and benefits of the procedure and the                         sedation options and risks were discussed with the                         patient. All questions were answered and informed                         consent was obtained.                        - Patient identification and proposed procedure were                         verified prior to the procedure by the nurse. The                         procedure was verified in the procedure room.                        - ASA Grade Assessment: III - A patient with severe                         systemic disease.                        - After reviewing the risks and benefits, the patient                         was deemed in satisfactory condition to undergo the                         procedure.                        After obtaining informed consent, the colonoscope was  passed under direct vision. Throughout the procedure,                         the patient's blood pressure, pulse, and oxygen                         saturations were monitored continuously. The                         Colonoscope was introduced through the  anus and                         advanced to the the cecum, identified by appendiceal                         orifice and ileocecal valve. The colonoscopy was                         performed without difficulty. The patient tolerated                         the procedure well. The quality of the bowel                         preparation was good. The ileocecal valve, appendiceal                         orifice, and rectum were photographed. The ileocecal                         valve, appendiceal orifice, and rectum were                         photographed. Findings:      The perianal exam findings include non-thrombosed external hemorrhoids       and internal hemorrhoids that prolapse with straining, but require       manual replacement into the anal canal (Grade III).      Non-bleeding internal hemorrhoids were found during retroflexion. The       hemorrhoids were Grade III (internal hemorrhoids that prolapse but       require manual reduction).      Many large-mouthed and medium-mouthed diverticula were found in the       entire colon. There was no evidence of diverticular bleeding.      An 11 mm polyp was found in the transverse colon. The polyp was sessile.       The polyp was removed with a hot snare. Resection and retrieval were       complete. Estimated blood loss: none.      The exam was otherwise without abnormality. Impression:            - Non-thrombosed external hemorrhoids and internal                         hemorrhoids that prolapse with straining, but require                         manual replacement into the anal canal (Grade III)  found on perianal exam.                        - Non-bleeding internal hemorrhoids.                        - Mild diverticulosis in the entire examined colon.                         There was no evidence of diverticular bleeding.                        - One 11 mm polyp in the transverse colon, removed                          with a hot snare. Resected and retrieved.                        - The examination was otherwise normal. Recommendation:        - Patient has a contact number available for                         emergencies. The signs and symptoms of potential                         delayed complications were discussed with the patient.                         Return to normal activities tomorrow. Written                         discharge instructions were provided to the patient.                        - Resume previous diet.                        - Continue present medications.                        - Schedule hemorrhoidal banding at my office at next                         available appointment.                        - If polyps are benign or adenomatous without                         dysplasia, I will advise NO further colonoscopy due to                         advanced age and/or severe comorbidity.                        - Return to my office at the next available                         appointment. Procedure Code(s):     --- Professional ---  54614, Colonoscopy, flexible; with removal of                         tumor(s), polyp(s), or other lesion(s) by snare                         technique Diagnosis Code(s):     --- Professional ---                        K57.30, Diverticulosis of large intestine without                         perforation or abscess without bleeding                        D50.0, Iron  deficiency anemia secondary to blood loss                         (chronic)                        K92.1, Melena (includes Hematochezia)                        D12.3, Benign neoplasm of transverse colon (hepatic                         flexure or splenic flexure)                        K64.4, Residual hemorrhoidal skin tags                        K64.2, Third degree hemorrhoids CPT copyright 2022 American [employer]. All rights reserved. The  codes documented in this report are preliminary and upon coder review may  be revised to meet current compliance requirements. [name] MD, MD 12/26/2023 12:16:22 PM This report has been signed electronically. Number of Addenda: 0 Note Initiated On: 12/26/2023 11:44 AM Scope Withdrawal Time: 0 hours 6 minutes 33 seconds  Total Procedure Duration: 0 hours 9 minutes 21 seconds  Estimated Blood Loss:  Estimated blood loss: none. Estimated blood loss was                         minimal.      [facility]

## 2023-12-26 NOTE — Progress Notes (Signed)
 Pressure held on IV site, right forearm,  after removal due to swelling. Explained to patient that bruise will be present at site for a couple of days, ice pack applied. Patient agreed and if any questions will call us  back.

## 2023-12-27 LAB — SURGICAL PATHOLOGY

## 2024-01-04 DIAGNOSIS — K642 Third degree hemorrhoids: Secondary | ICD-10-CM | POA: Diagnosis not present

## 2024-01-18 DIAGNOSIS — K642 Third degree hemorrhoids: Secondary | ICD-10-CM | POA: Diagnosis not present

## 2024-02-01 DIAGNOSIS — K642 Third degree hemorrhoids: Secondary | ICD-10-CM | POA: Diagnosis not present

## 2024-02-10 ENCOUNTER — Other Ambulatory Visit: Payer: Self-pay | Admitting: Cardiology

## 2024-02-10 ENCOUNTER — Other Ambulatory Visit: Payer: Self-pay | Admitting: Internal Medicine

## 2024-02-10 DIAGNOSIS — E119 Type 2 diabetes mellitus without complications: Secondary | ICD-10-CM

## 2024-03-21 DIAGNOSIS — E119 Type 2 diabetes mellitus without complications: Secondary | ICD-10-CM | POA: Diagnosis not present

## 2024-03-21 DIAGNOSIS — H2513 Age-related nuclear cataract, bilateral: Secondary | ICD-10-CM | POA: Diagnosis not present

## 2024-03-21 DIAGNOSIS — H35363 Drusen (degenerative) of macula, bilateral: Secondary | ICD-10-CM | POA: Diagnosis not present

## 2024-03-21 DIAGNOSIS — H3552 Pigmentary retinal dystrophy: Secondary | ICD-10-CM | POA: Diagnosis not present

## 2024-03-25 ENCOUNTER — Encounter: Payer: Self-pay | Admitting: Internal Medicine

## 2024-03-25 ENCOUNTER — Other Ambulatory Visit: Payer: Self-pay | Admitting: Internal Medicine

## 2024-03-25 ENCOUNTER — Ambulatory Visit: Payer: Self-pay | Admitting: Internal Medicine

## 2024-03-25 DIAGNOSIS — G629 Polyneuropathy, unspecified: Secondary | ICD-10-CM

## 2024-03-25 MED ORDER — GABAPENTIN 100 MG PO CAPS: 100.0000 mg | ORAL_CAPSULE | Freq: Every day | ORAL | 3 refills | Status: DC

## 2024-04-10 DIAGNOSIS — H6123 Impacted cerumen, bilateral: Secondary | ICD-10-CM | POA: Diagnosis not present

## 2024-04-10 DIAGNOSIS — H903 Sensorineural hearing loss, bilateral: Secondary | ICD-10-CM | POA: Diagnosis not present

## 2024-04-10 DIAGNOSIS — J309 Allergic rhinitis, unspecified: Secondary | ICD-10-CM | POA: Diagnosis not present

## 2024-04-15 ENCOUNTER — Inpatient Hospital Stay: Attending: Internal Medicine

## 2024-04-15 ENCOUNTER — Encounter: Payer: Self-pay | Admitting: Internal Medicine

## 2024-04-15 ENCOUNTER — Inpatient Hospital Stay

## 2024-04-15 ENCOUNTER — Inpatient Hospital Stay: Admitting: Internal Medicine

## 2024-04-15 VITALS — BP 141/82 | HR 65 | Temp 97.7°F | Resp 16 | Ht 67.99 in | Wt 246.8 lb

## 2024-04-15 DIAGNOSIS — I10 Essential (primary) hypertension: Secondary | ICD-10-CM | POA: Diagnosis not present

## 2024-04-15 DIAGNOSIS — Z833 Family history of diabetes mellitus: Secondary | ICD-10-CM | POA: Insufficient documentation

## 2024-04-15 DIAGNOSIS — I4891 Unspecified atrial fibrillation: Secondary | ICD-10-CM | POA: Diagnosis not present

## 2024-04-15 DIAGNOSIS — Z806 Family history of leukemia: Secondary | ICD-10-CM | POA: Diagnosis not present

## 2024-04-15 DIAGNOSIS — D696 Thrombocytopenia, unspecified: Secondary | ICD-10-CM | POA: Diagnosis not present

## 2024-04-15 DIAGNOSIS — Z8719 Personal history of other diseases of the digestive system: Secondary | ICD-10-CM | POA: Insufficient documentation

## 2024-04-15 DIAGNOSIS — R5383 Other fatigue: Secondary | ICD-10-CM | POA: Insufficient documentation

## 2024-04-15 DIAGNOSIS — D649 Anemia, unspecified: Secondary | ICD-10-CM

## 2024-04-15 DIAGNOSIS — G4730 Sleep apnea, unspecified: Secondary | ICD-10-CM | POA: Insufficient documentation

## 2024-04-15 DIAGNOSIS — Z7901 Long term (current) use of anticoagulants: Secondary | ICD-10-CM | POA: Insufficient documentation

## 2024-04-15 DIAGNOSIS — Z885 Allergy status to narcotic agent status: Secondary | ICD-10-CM | POA: Diagnosis not present

## 2024-04-15 DIAGNOSIS — K649 Unspecified hemorrhoids: Secondary | ICD-10-CM | POA: Diagnosis not present

## 2024-04-15 DIAGNOSIS — Z8249 Family history of ischemic heart disease and other diseases of the circulatory system: Secondary | ICD-10-CM | POA: Diagnosis not present

## 2024-04-15 DIAGNOSIS — E119 Type 2 diabetes mellitus without complications: Secondary | ICD-10-CM | POA: Insufficient documentation

## 2024-04-15 DIAGNOSIS — Z79899 Other long term (current) drug therapy: Secondary | ICD-10-CM | POA: Diagnosis not present

## 2024-04-15 DIAGNOSIS — D500 Iron deficiency anemia secondary to blood loss (chronic): Secondary | ICD-10-CM | POA: Diagnosis present

## 2024-04-15 DIAGNOSIS — Z85828 Personal history of other malignant neoplasm of skin: Secondary | ICD-10-CM | POA: Diagnosis not present

## 2024-04-15 LAB — CBC WITH DIFFERENTIAL (CANCER CENTER ONLY)
Abs Immature Granulocytes: 0.03 K/uL (ref 0.00–0.07)
Basophils Absolute: 0 K/uL (ref 0.0–0.1)
Basophils Relative: 1 %
Eosinophils Absolute: 0.1 K/uL (ref 0.0–0.5)
Eosinophils Relative: 2 %
HCT: 45.3 % (ref 39.0–52.0)
Hemoglobin: 14.4 g/dL (ref 13.0–17.0)
Immature Granulocytes: 1 %
Lymphocytes Relative: 25 %
Lymphs Abs: 1.2 K/uL (ref 0.7–4.0)
MCH: 27.4 pg (ref 26.0–34.0)
MCHC: 31.8 g/dL (ref 30.0–36.0)
MCV: 86.1 fL (ref 80.0–100.0)
Monocytes Absolute: 0.5 K/uL (ref 0.1–1.0)
Monocytes Relative: 10 %
Neutro Abs: 3.2 K/uL (ref 1.7–7.7)
Neutrophils Relative %: 61 %
Platelet Count: 182 K/uL (ref 150–400)
RBC: 5.26 MIL/uL (ref 4.22–5.81)
RDW: 14.4 % (ref 11.5–15.5)
WBC Count: 5 K/uL (ref 4.0–10.5)
nRBC: 0 % (ref 0.0–0.2)

## 2024-04-15 LAB — IRON AND TIBC
Iron: 53 ug/dL (ref 45–182)
Saturation Ratios: 10 % — ABNORMAL LOW (ref 17.9–39.5)
TIBC: 512 ug/dL — ABNORMAL HIGH (ref 250–450)
UIBC: 459 ug/dL

## 2024-04-15 LAB — BASIC METABOLIC PANEL - CANCER CENTER ONLY
Anion gap: 15 (ref 5–15)
BUN: 9 mg/dL (ref 8–23)
CO2: 24 mmol/L (ref 22–32)
Calcium: 9.5 mg/dL (ref 8.9–10.3)
Chloride: 101 mmol/L (ref 98–111)
Creatinine: 1.08 mg/dL (ref 0.61–1.24)
GFR, Estimated: >60 mL/min (ref >60)
Glucose, Bld: 184 mg/dL — ABNORMAL HIGH (ref 70–99)
Potassium: 3.5 mmol/L (ref 3.5–5.1)
Sodium: 140 mmol/L (ref 135–145)

## 2024-04-15 LAB — FERRITIN: Ferritin: 36 ng/mL (ref 24–336)

## 2024-04-15 NOTE — Progress Notes (Signed)
 [facility] CONSULT NOTE  Patient Care Team: Fernand Fredy RAMAN, MD as PCP - General (Internal Medicine) Rennie Brandon SAUNDERS, MD as Consulting Physician (Oncology)  CHIEF COMPLAINTS/PURPOSE OF CONSULTATION: ANEMIA  HEMATOLOGY HISTORY  # ANEMIA[Hb; MCV-platelets- WBC; Iron  sat; ferritin;  GFR- CT/US - ;   HISTORY OF PRESENTING ILLNESS: Patient ambulating-independently. Alone.  Brandon Gibson 73 y.o.  male pleasant patient is history of diabetes and also A-fib on  eliquis  -iron  deficient anemia is here for follow-up.  Discussed the use of AI scribe software for clinical note transcription with the patient, who gave verbal consent to proceed.  History of Present Illness   Brandon Gibson is a 73 year old male with chronic iron  deficiency anemia who presents for hematology follow-up to assess anemia status and management.  He has chronic iron  deficiency anemia, previously with hemoglobin as low as 8 g/dL, now improved to 86-85 g/dL following multiple iron  infusions, the most recent in August 2025. Iron  indices remain at the lower limit of normal but are improved compared to prior values.  He continues daily oral iron  supplementation (Vitron C, one tablet each morning) without dose escalation due to concern for gastrointestinal side effects. He reports regular bowel movements without constipation or gastrointestinal discomfort.  He recently underwent a hemorrhoid procedure with near-complete resolution of rectal bleeding, now only noting minimal residual blood.      Review of Systems  Constitutional:  Positive for malaise/fatigue. Negative for chills, diaphoresis, fever and weight loss.  HENT:  Negative for nosebleeds and sore throat.   Eyes:  Negative for double vision.  Respiratory:  Negative for cough, hemoptysis, sputum production, shortness of breath and wheezing.   Cardiovascular:  Negative for chest pain, palpitations, orthopnea and leg  swelling.  Gastrointestinal:  Negative for abdominal pain, blood in stool, constipation, diarrhea, heartburn, melena, nausea and vomiting.  Genitourinary:  Negative for dysuria, frequency and urgency.  Musculoskeletal:  Negative for back pain and joint pain.  Skin: Negative.  Negative for itching and rash.  Neurological:  Negative for dizziness, tingling, focal weakness, weakness and headaches.  Endo/Heme/Allergies:  Does not bruise/bleed easily.  Psychiatric/Behavioral:  Negative for depression. The patient is not nervous/anxious and does not have insomnia.      MEDICAL HISTORY:  Past Medical History:  Diagnosis Date   Arthritis    Atrial fibrillation (HCC)    BPH (benign prostatic hyperplasia)    Cancer (HCC)    HX SKIN CANCER Basal cell   Chickenpox    Chronic constipation    Clotting disorder    Diabetes mellitus without complication (HCC)    type 2   Dysrhythmia    IRREG HEART BEAT   GERD (gastroesophageal reflux disease)    H/O pleurisy    Hematochezia    Hypercholesteremia    Hyperlipidemia    Hypertension    Iron  deficiency anemia due to chronic blood loss 09/18/2017   Lumbar stenosis    Measles    Mumps    Prolapsed internal hemorrhoids    Sleep apnea    sleep study Dr. Deretha, uses CPAP    SURGICAL HISTORY: Past Surgical History:  Procedure Laterality Date   ANTERIOR CERVICAL DECOMP/DISCECTOMY FUSION  11/25/2011   Procedure: ANTERIOR CERVICAL DECOMPRESSION/DISCECTOMY FUSION 2 LEVELS;  Surgeon: Catalina CHRISTELLA Stains, MD;  Location: [facility];  Service: Neurosurgery;  Laterality: N/A;  Cervical four-five,Cervical five-six  Anterior cervical decompression/diskectomy, fusion, plate   BREAST BIOPSY Right    Benign  BREAST SURGERY Left 1986   lumpectomy   CARDIAC CATHETERIZATION     2011, [facility]   CARDIOVASCULAR STRESS TEST  2011   CERVICAL FUSION  1988   C 6/7    COLONOSCOPY N/A 12/26/2023   Procedure: COLONOSCOPY;  Surgeon: Toledo, Ladell POUR, MD;  Location:  [facility] ENDOSCOPY;  Service: Gastroenterology;  Laterality: N/A;  DM on Ozempic  and Eliquis    COLONOSCOPY WITH PROPOFOL  N/A 11/08/2017   Procedure: COLONOSCOPY WITH PROPOFOL ;  Surgeon: Toledo, Ladell POUR, MD;  Location: [facility] ENDOSCOPY;  Service: Gastroenterology;  Laterality: N/A;   ESOPHAGOGASTRODUODENOSCOPY (EGD) WITH PROPOFOL  N/A 11/08/2017   Procedure: ESOPHAGOGASTRODUODENOSCOPY (EGD) WITH PROPOFOL ;  Surgeon: Toledo, Ladell POUR, MD;  Location: [facility] ENDOSCOPY;  Service: Gastroenterology;  Laterality: N/A;   EYE SURGERY     LASIK   JOINT REPLACEMENT     KNEE ARTHROPLASTY Right 10/05/2015   Procedure: COMPUTER ASSISTED TOTAL KNEE ARTHROPLASTY;  Surgeon: Lynwood SHAUNNA Hue, MD;  Location: [facility] ORS;  Service: Orthopedics;  Laterality: Right;   KNEE ARTHROSCOPY  1986   Right   KNEE ARTHROSCOPY Left 08/10/2015   Procedure: LEFT KNEE ARTHROSCOPY, CHONDROPLASTY, MEDIAL MENISECTOMY;  Surgeon: Lynwood SHAUNNA Hue, MD;  Location: [facility] ORS;  Service: Orthopedics;  Laterality: Left;   LAMINECTOMY WITH POSTERIOR LATERAL ARTHRODESIS LEVEL 2 N/A 07/16/2018   Procedure: Posterior lumbar fusion with instrumentation at L3-4 with repeat facetectomy L3-4;  Surgeon: Joshua Alm RAMAN, MD;  Location: [facility] OR;  Service: Neurosurgery;  Laterality: N/A;  Posterior lumbar fusion with instrumentation at L3-4 with repeat facetectomy L3-4   LUMBAR LAMINECTOMY/DECOMPRESSION MICRODISCECTOMY N/A 10/04/2013   Procedure: LUMBAR TWO TO THREE LUMBAR LAMINECTOMY/DECOMPRESSION MICRODISCECTOMY 1 LEVEL;  Surgeon: Catalina CHRISTELLA Stains, MD;  Location: [facility];  Service: Neurosurgery;  Laterality: N/A;  L2-3 Laminectomy   POLYPECTOMY  12/26/2023   Procedure: POLYPECTOMY, INTESTINE;  Surgeon: Aundria, Ladell POUR, MD;  Location: [facility] ENDOSCOPY;  Service: Gastroenterology;;   POSTERIOR LAMINECTOMY / DECOMPRESSION LUMBAR SPINE  2006   TRANSESOPHAGEAL ECHOCARDIOGRAM  2011    SOCIAL HISTORY: Social History   Socioeconomic History   Marital status: Widowed     Spouse name: Not on file   Number of children: Not on file   Years of education: Not on file   Highest education level: Not on file  Occupational History   Not on file  Tobacco Use   Smoking status: Never   Smokeless tobacco: Never  Vaping Use   Vaping status: Never Used  Substance and Sexual Activity   Alcohol  use: Yes    Alcohol /week: 14.0 standard drinks of alcohol     Types: 14 Glasses of wine per week   Drug use: No   Sexual activity: Not on file  Other Topics Concern   Not on file  Social History Narrative   Not on file   Social Drivers of Health   Tobacco Use: Low Risk (04/15/2024)   Patient History    Smoking Tobacco Use: Never    Smokeless Tobacco Use: Never    Passive Exposure: Not on file  Financial Resource Strain: Low Risk  (11/29/2023)   Received from [facility]   Overall Financial Resource Strain (CARDIA)    Difficulty of Paying Living Expenses: Not hard at all  Food Insecurity: No Food Insecurity (11/29/2023)   Received from [facility]   Epic    Within the past 12 months, you worried that your food would run out before you got the money to buy more.: Never true  Within the past 12 months, the food you bought just didn't last and you didn't have money to get more.: Never true  Transportation Needs: No Transportation Needs (11/29/2023)   Received from [facility] - [facility]    In the past 12 months, has lack of transportation kept you from medical appointments or from getting medications?: No    Lack of Transportation (Non-Medical): No  Physical Activity: Not on file  Stress: No Stress Concern Present (12/18/2023)   [employer] - Occupational Stress Questionnaire    Feeling of Stress: Not at all  Social Connections: Not on file  Intimate Partner Violence: Not At Risk (03/16/2023)   Humiliation, Afraid, Rape, and Kick questionnaire    Fear of Current or  Ex-Partner: No    Emotionally Abused: No    Physically Abused: No    Sexually Abused: No  Depression (PHQ2-9): Low Risk (04/15/2024)   Depression (PHQ2-9)    PHQ-2 Score: 0  Alcohol  Screen: Not on file  Housing: Low Risk  (11/29/2023)   Received from [facility]   Epic    In the last 12 months, was there a time when you were not able to pay the mortgage or rent on time?: No    In the past 12 months, how many times have you moved where you were living?: 0    At any time in the past 12 months, were you homeless or living in a shelter (including now)?: No  Utilities: Not At Risk (11/29/2023)   Received from [facility]   Epic    In the past 12 months has the electric, gas, oil, or water company threatened to shut off services in your home?: No  Health Literacy: Adequate Health Literacy (12/18/2023)   B1300 Health Literacy    Frequency of need for help with medical instructions: Never    FAMILY HISTORY: Family History  Problem Relation Age of Onset   Leukemia Father    Diabetes Father    Heart disease Mother    Prostate cancer Neg Hx    Chronic Renal Failure Neg Hx    Breast cancer Neg Hx     ALLERGIES:  is allergic to morphine  and codeine, ace inhibitors, diazepam , and tape.  MEDICATIONS:  Current Outpatient Medications  Medication Sig Dispense Refill   ACCU-CHEK AVIVA PLUS test strip TEST BLOOD SUGAR EVERY DAY 100 strip 3   amiodarone  (PACERONE ) 200 MG tablet TAKE 1 TABLET EVERY DAY 90 tablet 3   apixaban  (ELIQUIS ) 5 MG TABS tablet Take 5 mg by mouth 2 (two) times daily.     baclofen  (LIORESAL ) 10 MG tablet Take 1 tablet (10 mg total) by mouth 2 (two) times daily. (Patient taking differently: Take 10 mg by mouth as needed.) 60 tablet 1   chlorthalidone (HYGROTON) 25 MG tablet TAKE 1 TABLET EVERY DAY 90 tablet 3   docusate (COLACE) 50 MG/5ML liquid Take by mouth daily. (Patient taking differently: Take by mouth as needed.)     gabapentin   (NEURONTIN ) 100 MG capsule Take 1 capsule (100 mg total) by mouth daily. 90 capsule 3   hydrALAZINE  (APRESOLINE ) 50 MG tablet TAKE 1 TABLET TWICE DAILY 180 tablet 3   Iron -Vitamin C  65-125 MG TABS Take 1 tablet by mouth 2 (two) times daily. (Patient taking differently: Take 1 tablet by mouth daily.) 180 tablet 1   loratadine  (CLARITIN ) 10 MG tablet Take 1 tablet (10 mg total) by mouth  daily. (Patient taking differently: Take 10 mg by mouth as needed.) 90 tablet 0   metFORMIN  (GLUCOPHAGE ) 500 MG tablet TAKE 1 TABLET TWICE DAILY 180 tablet 3   metoprolol  succinate (TOPROL -XL) 50 MG 24 hr tablet TAKE 1 TABLET EVERY DAY 90 tablet 3   montelukast  (SINGULAIR ) 10 MG tablet Take 1 tablet (10 mg total) by mouth daily. 90 tablet 2   omeprazole (PRILOSEC) 40 MG capsule TAKE 1 CAPSULE EVERY DAY 30 MINUTES BEFORE A MEAL 90 capsule 3   potassium chloride  (KLOR-CON ) 10 MEQ tablet TAKE 1 TABLET EVERY DAY 90 tablet 3   rosuvastatin  (CRESTOR ) 20 MG tablet TAKE 1 TABLET EVERY DAY 90 tablet 3   tamsulosin  (FLOMAX ) 0.4 MG CAPS capsule Take 1 capsule (0.4 mg total) by mouth daily. 90 capsule 3   trimethoprim  (TRIMPEX ) 100 MG tablet Take 1 tablet (100 mg total) by mouth daily. 90 tablet 3   ELIQUIS  5 MG TABS tablet TAKE 1 TABLET TWICE DAILY 180 tablet 3   fluticasone (FLONASE) 50 MCG/ACT nasal spray Place 2 sprays into both nostrils daily. (Patient taking differently: Place 2 sprays into both nostrils as needed.)     No current facility-administered medications for this visit.     PHYSICAL EXAMINATION:   Vitals:   04/15/24 0929 04/15/24 0940  BP: (!) 152/91 (!) 141/82  Pulse: 65   Resp: 16   Temp: 97.7 F (36.5 C)   SpO2: 97%    Filed Weights   04/15/24 0929  Weight: 246 lb 12.8 oz (111.9 kg)    Physical Exam Vitals and nursing note reviewed.  HENT:     Head: Normocephalic and atraumatic.     Mouth/Throat:     Pharynx: Oropharynx is clear.  Eyes:     Extraocular Movements: Extraocular movements  intact.     Pupils: Pupils are equal, round, and reactive to light.  Cardiovascular:     Rate and Rhythm: Normal rate. Rhythm irregular.  Pulmonary:     Comments: Decreased breath sounds bilaterally.  Abdominal:     Palpations: Abdomen is soft.  Musculoskeletal:        General: Normal range of motion.     Cervical back: Normal range of motion.  Skin:    General: Skin is warm.  Neurological:     General: No focal deficit present.     Mental Status: He is alert and oriented to person, place, and time.  Psychiatric:        Behavior: Behavior normal.        Judgment: Judgment normal.      LABORATORY DATA:  I have reviewed the data as listed Lab Results  Component Value Date   WBC 5.0 04/15/2024   HGB 14.4 04/15/2024   HCT 45.3 04/15/2024   MCV 86.1 04/15/2024   PLT 182 04/15/2024   Recent Labs    08/07/23 0915 08/15/23 1258 12/13/23 1031 12/15/23 1330 04/15/24 0907  NA 143 135 139 135 140  K 3.8 3.6 3.5 3.5 3.5  CL 110* 105 101 101 101  CO2 22 19* 24 25 24   GLUCOSE 116* 181* 151* 188* 184*  BUN 8 11 10 14 9   CREATININE 0.90 0.83 1.01 1.04 1.08  CALCIUM  8.6 8.6* 9.0 9.1 9.5  GFRNONAA  --  >60  --  >60 >60  PROT 5.7*  --  5.9*  --   --   ALBUMIN 3.9  --  4.2  --   --   AST 61*  --  32  --   --   ALT 45*  --  40  --   --   ALKPHOS 69  --  79  --   --   BILITOT 0.2  --  0.5  --   --      No results found.  ASSESSMENT & PLAN:   Symptomatic anemia # Iron  deficiency anemia- Hb 8 [PCP-NOV 2024-ferritin-12; I sat-10]-symptomatic. Etiology GI blood loss-?  Hemorrhoidal -/patient on Xarelto . Continue Vitron C  # s/p venofer  -  Hb 14 - iron  sat-14 - HOLD venofer  today- continue PO iron .   # Etiology of iron  deficiency: Unclear;-?   s/p previous d GI evaluation-EGD colonoscopy; /capsule study [2019]- [facility]- s/p hemorridal  surgery with Dr.Toledo in AUG 2025.   # Mild intermittent thrombocytopenia platelets greater than 100 stable. -  stable  # A.fib on Eliquis   [Dr.Khan]-monitor closely for anemia- stable  IV access:  # DISPOSITION: # HOLD venofer   today # follow up 4 month- MD; labs- cbc/bmp;LDH; iron  studies; ferritin- possible venofer - Dr.B   All questions were answered. The patient knows to call the clinic with any problems, questions or concerns.    Brandon JONELLE Joe, MD 04/15/2024 10:31 AM

## 2024-04-15 NOTE — Progress Notes (Signed)
 Fatigue/weakness: NOTHING OTHER THAN FALLING ASLEEP DURING THE DAY Dyspena: NO Light headedness: NO Blood in stool: SLIGHT AMOUNT/HEMORRHOIDS

## 2024-04-15 NOTE — Assessment & Plan Note (Addendum)
#   Iron  deficiency anemia- Hb 8 [PCP-NOV 2024-ferritin-12; I sat-10]-symptomatic. Etiology GI blood loss-?  Hemorrhoidal -/patient on Xarelto . Continue Vitron C  # s/p venofer  -  Hb 14 - iron  sat-14 - HOLD venofer  today- continue PO iron .   # Etiology of iron  deficiency: Unclear;-?   s/p previous d GI evaluation-EGD colonoscopy; ramonita study [2019]- [facility]- s/p hemorridal  surgery with Dr.Toledo in AUG 2025.   # Mild intermittent thrombocytopenia platelets greater than 100 stable. -  stable  # A.fib on Eliquis  [Dr.Khan]-monitor closely for anemia- stable  IV access:  # DISPOSITION: # HOLD venofer   today # follow up 4 month- MD; labs- cbc/bmp;LDH; iron  studies; ferritin- possible venofer - Dr.B

## 2024-04-16 ENCOUNTER — Ambulatory Visit: Admitting: Cardiovascular Disease

## 2024-04-16 ENCOUNTER — Encounter: Payer: Self-pay | Admitting: Cardiovascular Disease

## 2024-04-16 VITALS — BP 125/78 | HR 78 | Ht 68.0 in | Wt 247.2 lb

## 2024-04-16 DIAGNOSIS — E782 Mixed hyperlipidemia: Secondary | ICD-10-CM

## 2024-04-16 DIAGNOSIS — E1159 Type 2 diabetes mellitus with other circulatory complications: Secondary | ICD-10-CM | POA: Diagnosis not present

## 2024-04-16 DIAGNOSIS — G4733 Obstructive sleep apnea (adult) (pediatric): Secondary | ICD-10-CM | POA: Diagnosis not present

## 2024-04-16 DIAGNOSIS — Z8679 Personal history of other diseases of the circulatory system: Secondary | ICD-10-CM | POA: Diagnosis not present

## 2024-04-16 DIAGNOSIS — I10 Essential (primary) hypertension: Secondary | ICD-10-CM

## 2024-04-16 DIAGNOSIS — I480 Paroxysmal atrial fibrillation: Secondary | ICD-10-CM | POA: Diagnosis not present

## 2024-04-16 DIAGNOSIS — R0602 Shortness of breath: Secondary | ICD-10-CM | POA: Diagnosis not present

## 2024-04-16 DIAGNOSIS — I152 Hypertension secondary to endocrine disorders: Secondary | ICD-10-CM | POA: Diagnosis not present

## 2024-04-16 MED ORDER — AMIODARONE HCL 400 MG PO TABS: 400.0000 mg | ORAL_TABLET | Freq: Every day | ORAL | 0 refills | Status: AC

## 2024-04-16 NOTE — Progress Notes (Signed)
 Cardiology Office Note   Date:  04/16/2024   ID:  Brandon, Gibson [birth year], MRN [id number]  PCP:  Brandon Fredy RAMAN, MD  Cardiologist:  Brandon Fernand, MD      History of Present Illness: Brandon Gibson is a 73 y.o. male who presents for  Chief Complaint  Patient presents with   Follow-up    4 month follow up     Doing well.      Past Medical History:  Diagnosis Date   Arthritis    Atrial fibrillation (HCC)    BPH (benign prostatic hyperplasia)    Cancer (HCC)    HX SKIN CANCER Basal cell   Chickenpox    Chronic constipation    Clotting disorder    Diabetes mellitus without complication (HCC)    type 2   Dysrhythmia    IRREG HEART BEAT   GERD (gastroesophageal reflux disease)    H/O pleurisy    Hematochezia    Hypercholesteremia    Hyperlipidemia    Hypertension    Iron  deficiency anemia due to chronic blood loss 09/18/2017   Lumbar stenosis    Measles    Mumps    Prolapsed internal hemorrhoids    Sleep apnea    sleep study Dr. Deretha, uses CPAP     Past Surgical History:  Procedure Laterality Date   ANTERIOR CERVICAL DECOMP/DISCECTOMY FUSION  11/25/2011   Procedure: ANTERIOR CERVICAL DECOMPRESSION/DISCECTOMY FUSION 2 LEVELS;  Surgeon: Brandon CHRISTELLA Stains, MD;  Location: [facility];  Service: Neurosurgery;  Laterality: N/A;  Cervical four-five,Cervical five-six  Anterior cervical decompression/diskectomy, fusion, plate   BREAST BIOPSY Right    Benign   BREAST SURGERY Left 1986   lumpectomy   CARDIAC CATHETERIZATION     2011, [facility]   CARDIOVASCULAR STRESS TEST  2011   CERVICAL FUSION  1988   C 6/7    COLONOSCOPY N/A 12/26/2023   Procedure: COLONOSCOPY;  Surgeon: Brandon, Ladell POUR, MD;  Location: [facility];  Service: Gastroenterology;  Laterality: N/A;  DM on Ozempic  and Eliquis    COLONOSCOPY WITH PROPOFOL  N/A 11/08/2017   Procedure: COLONOSCOPY WITH PROPOFOL ;  Surgeon: Brandon, Ladell POUR, MD;  Location: [facility];  Service:  Gastroenterology;  Laterality: N/A;   ESOPHAGOGASTRODUODENOSCOPY (EGD) WITH PROPOFOL  N/A 11/08/2017   Procedure: ESOPHAGOGASTRODUODENOSCOPY (EGD) WITH PROPOFOL ;  Surgeon: Brandon, Ladell POUR, MD;  Location: [facility];  Service: Gastroenterology;  Laterality: N/A;   EYE SURGERY     LASIK   JOINT REPLACEMENT     KNEE ARTHROPLASTY Right 10/05/2015   Procedure: COMPUTER ASSISTED TOTAL KNEE ARTHROPLASTY;  Surgeon: Brandon SHAUNNA Hue, MD;  Location: [facility];  Service: Orthopedics;  Laterality: Right;   KNEE ARTHROSCOPY  1986   Right   KNEE ARTHROSCOPY Left 08/10/2015   Procedure: LEFT KNEE ARTHROSCOPY, CHONDROPLASTY, MEDIAL MENISECTOMY;  Surgeon: Brandon SHAUNNA Hue, MD;  Location: [facility];  Service: Orthopedics;  Laterality: Left;   LAMINECTOMY WITH POSTERIOR LATERAL ARTHRODESIS LEVEL 2 N/A 07/16/2018   Procedure: Posterior lumbar fusion with instrumentation at L3-4 with repeat facetectomy L3-4;  Surgeon: Brandon Alm RAMAN, MD;  Location: [facility];  Service: Neurosurgery;  Laterality: N/A;  Posterior lumbar fusion with instrumentation at L3-4 with repeat facetectomy L3-4   LUMBAR LAMINECTOMY/DECOMPRESSION MICRODISCECTOMY N/A 10/04/2013   Procedure: LUMBAR TWO TO THREE LUMBAR LAMINECTOMY/DECOMPRESSION MICRODISCECTOMY 1 LEVEL;  Surgeon: Brandon CHRISTELLA Stains, MD;  Location: [facility];  Service: Neurosurgery;  Laterality: N/A;  L2-3 Laminectomy   POLYPECTOMY  12/26/2023  Procedure: POLYPECTOMY, INTESTINE;  Surgeon: Brandon, Ladell POUR, MD;  Location: [facility];  Service: Gastroenterology;;   POSTERIOR LAMINECTOMY / DECOMPRESSION LUMBAR SPINE  2006   TRANSESOPHAGEAL ECHOCARDIOGRAM  2011     Current Outpatient Medications  Medication Sig Dispense Refill   ACCU-CHEK AVIVA PLUS test strip TEST BLOOD SUGAR EVERY DAY 100 strip 3   amiodarone  (PACERONE ) 400 MG tablet Take 1 tablet (400 mg total) by mouth daily. 60 tablet 0   apixaban  (ELIQUIS ) 5 MG TABS tablet Take 5 mg by mouth 2 (two) times daily.     baclofen   (LIORESAL ) 10 MG tablet Take 1 tablet (10 mg total) by mouth 2 (two) times daily. (Patient taking differently: Take 10 mg by mouth as needed.) 60 tablet 1   chlorthalidone (HYGROTON) 25 MG tablet TAKE 1 TABLET EVERY DAY 90 tablet 3   docusate (COLACE) 50 MG/5ML liquid Take by mouth daily. (Patient taking differently: Take by mouth as needed.)     ELIQUIS  5 MG TABS tablet TAKE 1 TABLET TWICE DAILY 180 tablet 3   fluticasone (FLONASE) 50 MCG/ACT nasal spray Place 2 sprays into both nostrils daily. (Patient taking differently: Place 2 sprays into both nostrils as needed.)     gabapentin  (NEURONTIN ) 100 MG capsule Take 1 capsule (100 mg total) by mouth daily. 90 capsule 3   hydrALAZINE  (APRESOLINE ) 50 MG tablet TAKE 1 TABLET TWICE DAILY 180 tablet 3   Iron -Vitamin C  65-125 MG TABS Take 1 tablet by mouth 2 (two) times daily. (Patient taking differently: Take 1 tablet by mouth daily.) 180 tablet 1   loratadine  (CLARITIN ) 10 MG tablet Take 1 tablet (10 mg total) by mouth daily. (Patient taking differently: Take 10 mg by mouth as needed.) 90 tablet 0   metFORMIN  (GLUCOPHAGE ) 500 MG tablet TAKE 1 TABLET TWICE DAILY 180 tablet 3   metoprolol  succinate (TOPROL -XL) 50 MG 24 hr tablet TAKE 1 TABLET EVERY DAY 90 tablet 3   montelukast  (SINGULAIR ) 10 MG tablet Take 1 tablet (10 mg total) by mouth daily. 90 tablet 2   omeprazole (PRILOSEC) 40 MG capsule TAKE 1 CAPSULE EVERY DAY 30 MINUTES BEFORE A MEAL 90 capsule 3   potassium chloride  (KLOR-CON ) 10 MEQ tablet TAKE 1 TABLET EVERY DAY 90 tablet 3   rosuvastatin  (CRESTOR ) 20 MG tablet TAKE 1 TABLET EVERY DAY 90 tablet 3   tamsulosin  (FLOMAX ) 0.4 MG CAPS capsule Take 1 capsule (0.4 mg total) by mouth daily. 90 capsule 3   trimethoprim  (TRIMPEX ) 100 MG tablet Take 1 tablet (100 mg total) by mouth daily. 90 tablet 3   No current facility-administered medications for this visit.    Allergies:   Morphine  and codeine, Ace inhibitors, Diazepam , and Tape    Social  History:   reports that he has never smoked. He has never used smokeless tobacco. He reports current alcohol  use of about 14.0 standard drinks of alcohol  per week. He reports that he does not use drugs.   Family History:  family history includes Diabetes in his father; Heart disease in his mother; Leukemia in his father.    ROS:     Review of Systems  Constitutional: Negative.   HENT: Negative.    Eyes: Negative.   Respiratory: Negative.    Gastrointestinal: Negative.   Genitourinary: Negative.   Musculoskeletal: Negative.   Skin: Negative.   Neurological: Negative.   Endo/Heme/Allergies: Negative.   Psychiatric/Behavioral: Negative.    All other systems reviewed and are negative.     All other  systems are reviewed and negative.    PHYSICAL EXAM: VS:  BP 125/78   Pulse 78   Ht 5' 8 (1.727 m)   Wt 247 lb 3.2 oz (112.1 kg)   SpO2 99%   BMI 37.59 kg/m  , BMI Body mass index is 37.59 kg/m. Last weight:  Wt Readings from Last 3 Encounters:  04/16/24 247 lb 3.2 oz (112.1 kg)  04/15/24 246 lb 12.8 oz (111.9 kg)  12/26/23 247 lb (112 kg)     Physical Exam Vitals reviewed.  Constitutional:      Appearance: Normal appearance. He is normal weight.  HENT:     Head: Normocephalic.     Nose: Nose normal.     Mouth/Throat:     Mouth: Mucous membranes are moist.  Eyes:     Pupils: Pupils are equal, round, and reactive to light.  Cardiovascular:     Rate and Rhythm: Normal rate and regular rhythm.     Pulses: Normal pulses.     Heart sounds: Normal heart sounds.  Pulmonary:     Effort: Pulmonary effort is normal.  Abdominal:     General: Abdomen is flat. Bowel sounds are normal.  Musculoskeletal:        General: Normal range of motion.     Cervical back: Normal range of motion.  Skin:    General: Skin is warm.  Neurological:     General: No focal deficit present.     Mental Status: He is alert.  Psychiatric:        Mood and Affect: Mood normal.       EKG:    Recent Labs: 12/13/2023: ALT 40; TSH 1.860 04/15/2024: BUN 9; Creatinine 1.08; Hemoglobin 14.4; Platelet Count 182; Potassium 3.5; Sodium 140    Lipid Panel    Component Value Date/Time   CHOL 116 12/13/2023 1031   TRIG 120 12/13/2023 1031   HDL 61 12/13/2023 1031   CHOLHDL 1.9 12/13/2023 1031   LDLCALC 34 12/13/2023 1031      Other studies Reviewed: Additional studies/ records that were reviewed today include:  Review of the above records demonstrates:       No data to display            ASSESSMENT AND PLAN:    ICD-10-CM   1. Essential hypertension, benign  I10 PCV ECHOCARDIOGRAM COMPLETE    amiodarone  (PACERONE ) 400 MG tablet    2. Hypertension associated with diabetes (HCC)  E11.59 PCV ECHOCARDIOGRAM COMPLETE   I15.2 amiodarone  (PACERONE ) 400 MG tablet    3. Shortness of breath  R06.02 PCV ECHOCARDIOGRAM COMPLETE    amiodarone  (PACERONE ) 400 MG tablet    4. Mixed hyperlipidemia  E78.2 PCV ECHOCARDIOGRAM COMPLETE    amiodarone  (PACERONE ) 400 MG tablet    5. Atrial fibrillation, currently in sinus rhythm  Z86.79 PCV ECHOCARDIOGRAM COMPLETE    amiodarone  (PACERONE ) 400 MG tablet    6. Obstructive sleep apnea  G47.33 PCV ECHOCARDIOGRAM COMPLETE    amiodarone  (PACERONE ) 400 MG tablet    7. Paroxysmal A-fib (HCC)  I48.0 PCV ECHOCARDIOGRAM COMPLETE    amiodarone  (PACERONE ) 400 MG tablet    8. SOB (shortness of breath)  R06.02 PCV ECHOCARDIOGRAM COMPLETE    amiodarone  (PACERONE ) 400 MG tablet   Doing well.       Problem List Items Addressed This Visit       Cardiovascular and Mediastinum   Essential hypertension, benign - Primary   Relevant Medications   amiodarone  (PACERONE ) 400 MG  tablet   Other Relevant Orders   PCV ECHOCARDIOGRAM COMPLETE   Hypertension associated with diabetes (HCC)   Relevant Medications   amiodarone  (PACERONE ) 400 MG tablet   Other Relevant Orders   PCV ECHOCARDIOGRAM COMPLETE     Respiratory   Obstructive sleep apnea    Relevant Medications   amiodarone  (PACERONE ) 400 MG tablet   Other Relevant Orders   PCV ECHOCARDIOGRAM COMPLETE     Other   Hyperlipidemia   Relevant Medications   amiodarone  (PACERONE ) 400 MG tablet   Other Relevant Orders   PCV ECHOCARDIOGRAM COMPLETE   Shortness of breath   Relevant Medications   amiodarone  (PACERONE ) 400 MG tablet   Other Relevant Orders   PCV ECHOCARDIOGRAM COMPLETE   Other Visit Diagnoses       Atrial fibrillation, currently in sinus rhythm       Relevant Medications   amiodarone  (PACERONE ) 400 MG tablet   Other Relevant Orders   PCV ECHOCARDIOGRAM COMPLETE     Paroxysmal A-fib (HCC)       Relevant Medications   amiodarone  (PACERONE ) 400 MG tablet   Other Relevant Orders   PCV ECHOCARDIOGRAM COMPLETE     SOB (shortness of breath)       Doing well.   Relevant Medications   amiodarone  (PACERONE ) 400 MG tablet   Other Relevant Orders   PCV ECHOCARDIOGRAM COMPLETE          Disposition:   Return in about 4 weeks (around 05/14/2024) for echo and f/u.    Total time spent: 35 minutes  Signed,  Brandon Bathe, MD  04/16/2024 9:28 AM    [facility]

## 2024-04-17 ENCOUNTER — Other Ambulatory Visit

## 2024-04-18 ENCOUNTER — Encounter: Payer: Self-pay | Admitting: Internal Medicine

## 2024-04-18 ENCOUNTER — Ambulatory Visit: Admitting: Internal Medicine

## 2024-04-18 VITALS — BP 122/70 | HR 68 | Ht 67.0 in | Wt 249.0 lb

## 2024-04-18 DIAGNOSIS — Z8679 Personal history of other diseases of the circulatory system: Secondary | ICD-10-CM | POA: Insufficient documentation

## 2024-04-18 DIAGNOSIS — G629 Polyneuropathy, unspecified: Secondary | ICD-10-CM | POA: Insufficient documentation

## 2024-04-18 DIAGNOSIS — I152 Hypertension secondary to endocrine disorders: Secondary | ICD-10-CM | POA: Diagnosis not present

## 2024-04-18 DIAGNOSIS — E1169 Type 2 diabetes mellitus with other specified complication: Secondary | ICD-10-CM | POA: Diagnosis not present

## 2024-04-18 DIAGNOSIS — E782 Mixed hyperlipidemia: Secondary | ICD-10-CM | POA: Diagnosis not present

## 2024-04-18 DIAGNOSIS — E119 Type 2 diabetes mellitus without complications: Secondary | ICD-10-CM

## 2024-04-18 DIAGNOSIS — E1159 Type 2 diabetes mellitus with other circulatory complications: Secondary | ICD-10-CM | POA: Diagnosis not present

## 2024-04-18 DIAGNOSIS — J301 Allergic rhinitis due to pollen: Secondary | ICD-10-CM

## 2024-04-18 LAB — PSA: Prostate Specific Ag, Serum: 1.1 ng/mL (ref 0.0–4.0)

## 2024-04-18 LAB — POCT CBG (FASTING - GLUCOSE)-MANUAL ENTRY: Glucose Fasting, POC: 168 mg/dL — AB (ref 70–99)

## 2024-04-18 NOTE — Progress Notes (Signed)
 Established Patient Office Visit  Subjective:  Patient ID: Brandon Gibson, male    DOB: 03/24/1951  Age: 73 y.o. MRN: [id number]  Chief Complaint  Patient presents with   Follow-up    4 months, needs CKD    Patient is here today for follow up visit. He  reports feeling well but has some concerns.  He states his neuropathy is not well controlled. Advised patient to increase gabapentin  to once morning and once at bedtime and increase to two tablets at bedtime as needed. Patient is interested in also using topical herbal neuropathy cream. Patient is diabetic and reports doing daily foot exams. He reports Dermatologist completes his diabetic foot exam when he goes annually for his skin cancer check.  Patient reports fasting blood glucose running 150-160's range. He trys to watch his diet but endorses he still eats potatoes, pasta and enjoys couple glasses red wine on occasion.   He reports allergies have not been well controlled but improved slightly after adding on Singulair . He just saw ENT and they want him to have allergy testing completed. He reports using his medications as prescribed.   Reports he was in Af[facility] recently and Cardiology increased amiodarone  to 400 mg daily. Denies chest pain, shortness of breath or palpitations at this time.  Overall he is doing well and has no additional complaints at this time. Patient got labs done yesterday and will notify him when labs result and any changes to medications needed.    No other concerns at this time.   Past Medical History:  Diagnosis Date   Arthritis    Atrial fibrillation (HCC)    BPH (benign prostatic hyperplasia)    Cancer (HCC)    HX SKIN CANCER Basal cell   Chickenpox    Chronic constipation    Clotting disorder    Diabetes mellitus without complication (HCC)    type 2   Dysrhythmia    IRREG HEART BEAT   GERD (gastroesophageal reflux disease)    H/O pleurisy    Hematochezia    Hypercholesteremia     Hyperlipidemia    Hypertension    Iron  deficiency anemia due to chronic blood loss 09/18/2017   Lumbar stenosis    Measles    Mumps    Prolapsed internal hemorrhoids    Sleep apnea    sleep study Dr. Deretha, uses CPAP    Past Surgical History:  Procedure Laterality Date   ANTERIOR CERVICAL DECOMP/DISCECTOMY FUSION  11/25/2011   Procedure: ANTERIOR CERVICAL DECOMPRESSION/DISCECTOMY FUSION 2 LEVELS;  Surgeon: Catalina CHRISTELLA Stains, MD;  Location: [facility];  Service: Neurosurgery;  Laterality: N/A;  Cervical four-five,Cervical five-six  Anterior cervical decompression/diskectomy, fusion, plate   BREAST BIOPSY Right    Benign   BREAST SURGERY Left 1986   lumpectomy   CARDIAC CATHETERIZATION     2011, [facility]   CARDIOVASCULAR STRESS TEST  2011   CERVICAL FUSION  1988   C 6/7    COLONOSCOPY N/A 12/26/2023   Procedure: COLONOSCOPY;  Surgeon: Toledo, Ladell POUR, MD;  Location: [facility];  Service: Gastroenterology;  Laterality: N/A;  DM on Ozempic  and Eliquis    COLONOSCOPY WITH PROPOFOL  N/A 11/08/2017   Procedure: COLONOSCOPY WITH PROPOFOL ;  Surgeon: Toledo, Ladell POUR, MD;  Location: [facility] ENDOS[facility];  Service: Gastroenterology;  Laterality: N/A;   ESOPHAGOGASTRODUODENOSCOPY (EGD) WITH PROPOFOL  N/A 11/08/2017   Procedure: ESOPHAGOGASTRODUODENOSCOPY (EGD) WITH PROPOFOL ;  Surgeon: Toledo, Ladell POUR, MD;  Location: [facility] ENDOSCOPY;  Service: Gastroenterology;  Laterality: N/A;  EYE SURGERY     LASIK   JOINT REPLACEMENT     KNEE ARTHROPLASTY Right 10/05/2015   Procedure: COMPUTER ASSISTED TOTAL KNEE ARTHROPLASTY;  Surgeon: Lynwood SHAUNNA Hue, MD;  Location: [facility] OR[facility];  Service: Orthopedics;  Laterality: Right;   KNEE ARTHROSCOPY  1986   Right   KNEE ARTHROSCOPY Left 08/10/2015   Procedure: LEFT KNEE ARTHROSCOPY, CHONDROPLASTY, MEDIAL MENISECTOMY;  Surgeon: Lynwood SHAUNNA Hue, MD;  Location: [facility] ORS;  Service: Orthopedics;  Laterality: Left;   LAMINECTOMY WITH POSTERIOR LATERAL ARTHRODESIS LEVEL 2 N/A  07/16/2018   Procedure: Posterior lumbar fusion with instrumentation at L3-4 with repeat facetectomy L3-4;  Surgeon: Joshua Alm RAMAN, MD;  Location: [facility] OR;  Service: Neurosurgery;  Laterality: N/A;  Posterior lumbar fusion with instrumentation at L3-4 with repeat facetectomy L3-4   LUMBAR LAMINECTOMY/DECOMPRESSION MICRODISCECTOMY N/A 10/04/2013   Procedure: LUMBAR TWO TO THREE LUMBAR LAMINECTOMY/DECOMPRESSION MICRODISCECTOMY 1 LEVEL;  Surgeon: Catalina CHRISTELLA Stains, MD;  Location: [facility];  Service: Neurosurgery;  Laterality: N/A;  L2-3 Laminectomy   POLYPECTOMY  12/26/2023   Procedure: POLYPECTOMY, INTESTINE;  Surgeon: Aundria, Ladell POUR, MD;  Location: [facility] ENDOSCOPY;  Service: Gastroenterology;;   POSTERIOR LAMINECTOMY / DECOMPRESSION LUMBAR SPINE  2006   TRANSESOPHAGEAL ECHOCARDIOGRAM  2011    Social History   Socioeconomic History   Marital status: Widowed    Spouse name: Not on file   Number of children: Not on file   Years of education: Not on file   Highest education level: Not on file  Occupational History   Not on file  Tobacco Use   Smoking status: Never   Smokeless tobacco: Never  Vaping Use   Vaping status: Never Used  Substance and Sexual Activity   Alcohol  use: Yes    Alcohol /week: 14.0 standard drinks of alcohol     Types: 14 Glasses of wine per week   Drug use: No   Sexual activity: Not on file  Other Topics Concern   Not on file  Social History Narrative   Not on file   Social Drivers of Health   Tobacco Use: Low Risk (04/16/2024)   Patient History    Smoking Tobacco Use: Never    Smokeless Tobacco Use: Never    Passive Exposure: Not on file  Financial Resource Strain: Low Risk  (11/29/2023)   Received from [facility]   Overall Financial Resource Strain (CARDIA)    Difficulty of Paying Living Expenses: Not hard at all  Food Insecurity: No Food Insecurity (11/29/2023)   Received from [facility]   Epic    Within the  past 12 months, you worried that your food would run out before you got the money to buy more.: Never true    Within the past 12 months, the food you bought just didn't last and you didn't have money to get more.: Never true  Transportation Needs: No Transportation Needs (11/29/2023)   Received from [facility] - Transportation    In the past 12 months, has lack of transportation kept you from medical appointments or from getting medications?: No    Lack of Transportation (Non-Medical): No  Physical Activity: Not on file  Stress: No Stress Concern Present (12/18/2023)   [employer] - Occupational Stress Questionnaire    Feeling of Stress: Not at all  Social Connections: Not on file  Intimate Partner Violence: Not At Risk (03/16/2023)   Humiliation, Afraid, Rape, and Kick questionnaire  Fear of Current or Ex-Partner: No    Emotionally Abused: No    Physically Abused: No    Sexually Abused: No  Depression (PHQ2-9): Low Risk (04/15/2024)   Depression (PHQ2-9)    PHQ-2 Score: 0  Alcohol  Screen: Not on file  Housing: Low Risk  (11/29/2023)   Received from [facility]   Epic    In the last 12 months, was there a time when you were not able to pay the mortgage or rent on time?: No    In the past 12 months, how many times have you moved where you were living?: 0    At any time in the past 12 months, were you homeless or living in a shelter (including now)?: No  Utilities: Not At Risk (11/29/2023)   Received from [facility]   Epic    In the past 12 months has the electric, gas, oil, or water company threatened to shut off services in your home?: No  Health Literacy: Adequate Health Literacy (12/18/2023)   B1300 Health Literacy    Frequency of need for help with medical instructions: Never    Family History  Problem Relation Age of Onset   Leukemia Father    Diabetes Father    Heart disease  Mother    Prostate cancer Neg Hx    Chronic Renal Failure Neg Hx    Breast cancer Neg Hx     Allergies[1]  Show/hide medication list[2]  Review of Systems  Constitutional: Negative.  Negative for chills, fever and malaise/fatigue.  HENT:  Positive for congestion. Negative for sore throat.   Eyes: Negative.  Negative for blurred vision and pain.  Respiratory: Negative.  Negative for cough and shortness of breath.   Cardiovascular: Negative.  Negative for chest pain, palpitations and leg swelling.  Gastrointestinal: Negative.  Negative for abdominal pain, blood in stool, constipation, diarrhea, heartburn, melena, nausea and vomiting.  Genitourinary: Negative.  Negative for dysuria, flank pain, frequency and urgency.  Musculoskeletal: Negative.  Negative for joint pain and myalgias.  Skin: Negative.   Neurological:  Positive for sensory change. Negative for dizziness, tingling, weakness and headaches.  Endo/Heme/Allergies: Negative.   Psychiatric/Behavioral: Negative.  Negative for depression and suicidal ideas. The patient is not nervous/anxious.        Objective:   BP 122/70   Pulse 68   Ht 5' 7 (1.702 m)   Wt 249 lb (112.9 kg)   SpO2 97%   BMI 39.00 kg/m   Vitals:   04/18/24 0919  BP: 122/70  Pulse: 68  Height: 5' 7 (1.702 m)  Weight: 249 lb (112.9 kg)  SpO2: 97%  BMI (Calculated): 38.99    Physical Exam Vitals and nursing note reviewed.  Constitutional:      General: He is not in acute distress.    Appearance: Normal appearance. He is not ill-appearing.  HENT:     Head: Normocephalic and atraumatic.     Nose: Nose normal.     Mouth/Throat:     Mouth: Mucous membranes are moist.     Pharynx: Oropharynx is clear.  Eyes:     Conjunctiva/sclera: Conjunctivae normal.     Pupils: Pupils are equal, round, and reactive to light.  Cardiovascular:     Rate and Rhythm: Normal rate and regular rhythm.     Pulses: Normal pulses.     Heart sounds: Normal heart  sounds.  Pulmonary:     Effort: Pulmonary effort is normal.  Breath sounds: Normal breath sounds. No wheezing or rhonchi.  Abdominal:     General: Bowel sounds are normal. There is no distension.     Palpations: Abdomen is soft.     Tenderness: There is no abdominal tenderness.  Musculoskeletal:        General: Normal range of motion.     Cervical back: Normal range of motion and neck supple.     Right lower leg: No edema.     Left lower leg: No edema.  Skin:    General: Skin is warm and dry.     Capillary Refill: Capillary refill takes less than 2 seconds.  Neurological:     General: No focal deficit present.     Mental Status: He is alert and oriented to person, place, and time.     Sensory: No sensory deficit.     Motor: No weakness.  Psychiatric:        Mood and Affect: Mood normal.        Behavior: Behavior normal.        Judgment: Judgment normal.      Results for orders placed or performed in visit on 04/18/24  POCT CBG (Fasting - Glucose)  Result Value Ref Range   Glucose Fasting, POC 168 (A) 70 - 99 mg/dL    Recent Results (from the past 2160 hours)  Ferritin     Status: None   Collection Time: 04/15/24  9:07 AM  Result Value Ref Range   Ferritin 36 24 - 336 ng/mL    Comment: Performed at [facility], [address] [place], [place] [address]  Iron  and TIBC     Status: Abnormal   Collection Time: 04/15/24  9:07 AM  Result Value Ref Range   Iron  53 45 - 182 ug/dL   TIBC 487 (H) 749 - 549 ug/dL   Saturation Ratios 10 (L) 17.9 - 39.5 %   UIBC 459 ug/dL    Comment: Performed at [facility], [address] [place], [place] [address]  Basic Metabolic Panel - Cancer Center Only     Status: Abnormal   Collection Time: 04/15/24  9:07 AM  Result Value Ref Range   Sodium 140 135 - 145 mmol/L   Potassium 3.5 3.5 - 5.1 mmol/L   Chloride 101 98 - 111 mmol/L   CO2 24 22 - 32 mmol/L   Glucose, Bld 184 (H) 70 - 99 mg/dL    Comment: Glucose  reference range applies only to samples taken after fasting for at least 8 hours.   BUN 9 8 - 23 mg/dL   Creatinine 8.91 9.38 - 1.24 mg/dL   Calcium  9.5 8.9 - 10.3 mg/dL   GFR, Estimated >39 >39 mL/min    Comment: (NOTE) Calculated using the CKD-EPI Creatinine Equation (2021)    Anion gap 15 5 - 15    Comment: Performed at [facility], [address] [place], [place] [address]  CBC with Differential (Cancer Center Only)     Status: None   Collection Time: 04/15/24  9:07 AM  Result Value Ref Range   WBC Count 5.0 4.0 - 10.5 K/uL   RBC 5.26 4.22 - 5.81 MIL/uL   Hemoglobin 14.4 13.0 - 17.0 g/dL   HCT 54.6 60.9 - 47.9 %   MCV 86.1 80.0 - 100.0 fL   MCH 27.4 26.0 - 34.0 pg   MCHC 31.8 30.0 - 36.0 g/dL   RDW 85.5 88.4 - 84.4 %  Platelet Count 182 150 - 400 K/uL   nRBC 0.0 0.0 - 0.2 %   Neutrophils Relative % 61 %   Neutro Abs 3.2 1.7 - 7.7 K/uL   Lymphocytes Relative 25 %   Lymphs Abs 1.2 0.7 - 4.0 K/uL   Monocytes Relative 10 %   Monocytes Absolute 0.5 0.1 - 1.0 K/uL   Eosinophils Relative 2 %   Eosinophils Absolute 0.1 0.0 - 0.5 K/uL   Basophils Relative 1 %   Basophils Absolute 0.0 0.0 - 0.1 K/uL   Immature Granulocytes 1 %   Abs Immature Granulocytes 0.03 0.00 - 0.07 K/uL    Comment: Performed at [facility], [address] [place], [place] [address]  PSA     Status: None   Collection Time: 04/17/24 10:22 AM  Result Value Ref Range   Prostate Specific Ag, Serum 1.1 0.0 - 4.0 ng/mL    Comment: Roche ECLIA methodology. According to the American Urological Association, Serum PSA should decrease and remain at undetectable levels after radical prostatectomy. The AUA defines biochemical recurrence as an initial PSA value 0.2 ng/mL or greater followed by a subsequent confirmatory PSA value 0.2 ng/mL or greater. Values obtained with different assay methods or kits cannot be used interchangeably. Results cannot be interpreted as absolute evidence of the  presence or absence of malignant disease.   POCT CBG (Fasting - Glucose)     Status: Abnormal   Collection Time: 04/18/24  9:26 AM  Result Value Ref Range   Glucose Fasting, POC 168 (A) 70 - 99 mg/dL      Assessment & Plan:  Increase gabapentin  to 100 mg twice daily and up to 200 mg at bedtime dose. Recommend patient get allergy labs completed as recommended by his ENT. Keep specialist appointments as scheduled. Problem List Items Addressed This Visit       Cardiovascular and Mediastinum   Essential hypertension, benign - Primary     Respiratory   Seasonal allergic rhinitis due to pollen     Endocrine   Diabetes mellitus type 2, uncomplicated (HCC)   Relevant Orders   POCT CBG (Fasting - Glucose) (Completed)     Nervous and Auditory   Neuropathy   Relevant Orders   Vitamin B12   Vitamin B1     Other   Hyperlipidemia   Severe obesity with body mass index (BMI) of 35.0 to 39.9 with comorbidity (HCC)   Atrial fibrillation, currently in sinus rhythm    Return in about 3 months (around 07/17/2024).   Total time spent: 25 minutes. This time includes review of previous notes and results and patient face to face interaction during today's visit.    FERNAND FREDY RAMAN, MD  04/18/2024   This document may have been prepared by [employer] Voice Recognition software and as such may include unintentional dictation errors.     [1]  Allergies Allergen Reactions   Morphine  And Codeine Anaphylaxis   Ace Inhibitors Swelling    Other reaction(s): Unknown   Diazepam     Tape   [2]  Outpatient Medications Prior to Visit  Medication Sig   ACCU-CHEK AVIVA PLUS test strip TEST BLOOD SUGAR EVERY DAY   amiodarone  (PACERONE ) 400 MG tablet Take 1 tablet (400 mg total) by mouth daily.   apixaban  (ELIQUIS ) 5 MG TABS tablet Take 5 mg by mouth 2 (two) times daily.   baclofen  (LIORESAL ) 10 MG tablet Take 1 tablet (10 mg total) by mouth 2 (two) times daily. (Patient taking differently: Take  10 mg  by mouth as needed.)   chlorthalidone (HYGROTON) 25 MG tablet TAKE 1 TABLET EVERY DAY   docusate (COLACE) 50 MG/5ML liquid Take by mouth daily. (Patient taking differently: Take by mouth as needed.)   ELIQUIS  5 MG TABS tablet TAKE 1 TABLET TWICE DAILY   fluticasone (FLONASE) 50 MCG/ACT nasal spray Place 2 sprays into both nostrils daily. (Patient taking differently: Place 2 sprays into both nostrils as needed.)   gabapentin  (NEURONTIN ) 100 MG capsule Take 1 capsule (100 mg total) by mouth daily.   hydrALAZINE  (APRESOLINE ) 50 MG tablet TAKE 1 TABLET TWICE DAILY   Iron -Vitamin C  65-125 MG TABS Take 1 tablet by mouth 2 (two) times daily. (Patient taking differently: Take 1 tablet by mouth daily.)   loratadine  (CLARITIN ) 10 MG tablet Take 1 tablet (10 mg total) by mouth daily. (Patient taking differently: Take 10 mg by mouth as needed.)   metFORMIN  (GLUCOPHAGE ) 500 MG tablet TAKE 1 TABLET TWICE DAILY   metoprolol  succinate (TOPROL -XL) 50 MG 24 hr tablet TAKE 1 TABLET EVERY DAY   montelukast  (SINGULAIR ) 10 MG tablet Take 1 tablet (10 mg total) by mouth daily.   omeprazole (PRILOSEC) 40 MG capsule TAKE 1 CAPSULE EVERY DAY 30 MINUTES BEFORE A MEAL   potassium chloride  (KLOR-CON ) 10 MEQ tablet TAKE 1 TABLET EVERY DAY   rosuvastatin  (CRESTOR ) 20 MG tablet TAKE 1 TABLET EVERY DAY   tamsulosin  (FLOMAX ) 0.4 MG CAPS capsule Take 1 capsule (0.4 mg total) by mouth daily.   trimethoprim  (TRIMPEX ) 100 MG tablet Take 1 tablet (100 mg total) by mouth daily.   No facility-administered medications prior to visit.

## 2024-04-18 NOTE — Addendum Note (Signed)
 Addended by: FERNAND FREDY RAMAN on: 04/18/2024 02:57 PM   Modules accepted: Orders

## 2024-05-01 NOTE — Progress Notes (Signed)
 Brandon Gibson                                          MRN: [id number]   05/01/2024   The [facility] Quality Team Specialist reviewed this patient medical record for the purposes of chart review for care gap closure. The following were reviewed: abstraction for care gap closure-controlling blood pressure.    [facility] Quality Team

## 2024-05-05 ENCOUNTER — Other Ambulatory Visit: Payer: Self-pay | Admitting: Cardiovascular Disease

## 2024-05-14 ENCOUNTER — Other Ambulatory Visit

## 2024-05-15 ENCOUNTER — Other Ambulatory Visit

## 2024-05-15 DIAGNOSIS — I340 Nonrheumatic mitral (valve) insufficiency: Secondary | ICD-10-CM

## 2024-05-15 DIAGNOSIS — R0602 Shortness of breath: Secondary | ICD-10-CM

## 2024-05-15 DIAGNOSIS — Z8679 Personal history of other diseases of the circulatory system: Secondary | ICD-10-CM

## 2024-05-15 DIAGNOSIS — I371 Nonrheumatic pulmonary valve insufficiency: Secondary | ICD-10-CM

## 2024-05-15 DIAGNOSIS — E782 Mixed hyperlipidemia: Secondary | ICD-10-CM

## 2024-05-15 DIAGNOSIS — I10 Essential (primary) hypertension: Secondary | ICD-10-CM

## 2024-05-15 DIAGNOSIS — G4733 Obstructive sleep apnea (adult) (pediatric): Secondary | ICD-10-CM

## 2024-05-15 DIAGNOSIS — I351 Nonrheumatic aortic (valve) insufficiency: Secondary | ICD-10-CM

## 2024-05-15 DIAGNOSIS — E1159 Type 2 diabetes mellitus with other circulatory complications: Secondary | ICD-10-CM

## 2024-05-15 DIAGNOSIS — I361 Nonrheumatic tricuspid (valve) insufficiency: Secondary | ICD-10-CM

## 2024-05-15 DIAGNOSIS — I480 Paroxysmal atrial fibrillation: Secondary | ICD-10-CM

## 2024-05-17 ENCOUNTER — Encounter: Payer: Self-pay | Admitting: Cardiovascular Disease

## 2024-05-17 ENCOUNTER — Other Ambulatory Visit: Payer: Self-pay | Admitting: Internal Medicine

## 2024-05-17 ENCOUNTER — Ambulatory Visit: Admitting: Cardiovascular Disease

## 2024-05-17 VITALS — BP 120/72 | HR 73 | Ht 67.0 in | Wt 252.8 lb

## 2024-05-17 DIAGNOSIS — Z8679 Personal history of other diseases of the circulatory system: Secondary | ICD-10-CM

## 2024-05-17 DIAGNOSIS — G4733 Obstructive sleep apnea (adult) (pediatric): Secondary | ICD-10-CM | POA: Diagnosis not present

## 2024-05-17 DIAGNOSIS — E782 Mixed hyperlipidemia: Secondary | ICD-10-CM

## 2024-05-17 DIAGNOSIS — I480 Paroxysmal atrial fibrillation: Secondary | ICD-10-CM | POA: Diagnosis not present

## 2024-05-17 DIAGNOSIS — I152 Hypertension secondary to endocrine disorders: Secondary | ICD-10-CM

## 2024-05-17 DIAGNOSIS — R0602 Shortness of breath: Secondary | ICD-10-CM | POA: Diagnosis not present

## 2024-05-17 DIAGNOSIS — I428 Other cardiomyopathies: Secondary | ICD-10-CM | POA: Diagnosis not present

## 2024-05-17 DIAGNOSIS — E1159 Type 2 diabetes mellitus with other circulatory complications: Secondary | ICD-10-CM

## 2024-05-17 DIAGNOSIS — G629 Polyneuropathy, unspecified: Secondary | ICD-10-CM

## 2024-05-17 MED ORDER — LOSARTAN POTASSIUM 25 MG PO TABS: 25.0000 mg | ORAL_TABLET | Freq: Every day | ORAL | 1 refills | Status: AC

## 2024-05-17 MED ORDER — HYDRALAZINE HCL 25 MG PO TABS: 25.0000 mg | ORAL_TABLET | Freq: Two times a day (BID) | ORAL | 1 refills | Status: AC

## 2024-05-17 NOTE — Progress Notes (Signed)
 "     Cardiology Office Note   Date:  05/17/2024   ID:  Brandon Gibson 07-12-1950, MRN [id number]  PCP:  Brandon Fredy RAMAN, MD  Cardiologist:  Brandon Fernand, MD      History of Present Illness: Brandon Gibson is a 74 y.o. male who presents for  Chief Complaint  Patient presents with   Follow-up    Echo follow up    Doing well.      Past Medical History:  Diagnosis Date   Arthritis    Atrial fibrillation (HCC)    BPH (benign prostatic hyperplasia)    Cancer (HCC)    HX SKIN CANCER Basal cell   Chickenpox    Chronic constipation    Clotting disorder    Diabetes mellitus without complication (HCC)    type 2   Dysrhythmia    IRREG HEART BEAT   GERD (gastroesophageal reflux disease)    H/O pleurisy    Hematochezia    Hypercholesteremia    Hyperlipidemia    Hypertension    Iron  deficiency anemia due to chronic blood loss 09/18/2017   Lumbar stenosis    Measles    Mumps    Prolapsed internal hemorrhoids    Sleep apnea    sleep study Dr. Deretha, uses CPAP     Past Surgical History:  Procedure Laterality Date   ANTERIOR CERVICAL DECOMP/DISCECTOMY FUSION  11/25/2011   Procedure: ANTERIOR CERVICAL DECOMPRESSION/DISCECTOMY FUSION 2 LEVELS;  Surgeon: Catalina CHRISTELLA Stains, MD;  Location: [facility];  Service: Neurosurgery;  Laterality: N/A;  Cervical four-five,Cervical five-six  Anterior cervical decompression/diskectomy, fusion, plate   BREAST BIOPSY Right    Benign   BREAST SURGERY Left 1986   lumpectomy   CARDIAC CATHETERIZATION     2011, [facility]   CARDIOVASCULAR STRESS TEST  2011   CERVICAL FUSION  1988   C 6/7    COLONOSCOPY N/A 12/26/2023   Procedure: COLONOSCOPY;  Surgeon: Toledo, Ladell POUR, MD;  Location: [facility];  Service: Gastroenterology;  Laterality: N/A;  DM on Ozempic  and Eliquis    COLONOSCOPY WITH PROPOFOL  N/A 11/08/2017   Procedure: COLONOSCOPY WITH PROPOFOL ;  Surgeon: Toledo, Ladell POUR, MD;  Location: [facility];  Service:  Gastroenterology;  Laterality: N/A;   ESOPHAGOGASTRODUODENOSCOPY (EGD) WITH PROPOFOL  N/A 11/08/2017   Procedure: ESOPHAGOGASTRODUODENOSCOPY (EGD) WITH PROPOFOL ;  Surgeon: Toledo, Ladell POUR, MD;  Location: [facility];  Service: Gastroenterology;  Laterality: N/A;   EYE SURGERY     LASIK   JOINT REPLACEMENT     KNEE ARTHROPLASTY Right 10/05/2015   Procedure: COMPUTER ASSISTED TOTAL KNEE ARTHROPLASTY;  Surgeon: Lynwood SHAUNNA Hue, MD;  Location: [facility];  Service: Orthopedics;  Laterality: Right;   KNEE ARTHROSCOPY  1986   Right   KNEE ARTHROSCOPY Left 08/10/2015   Procedure: LEFT KNEE ARTHROSCOPY, CHONDROPLASTY, MEDIAL MENISECTOMY;  Surgeon: Lynwood SHAUNNA Hue, MD;  Location: [facility];  Service: Orthopedics;  Laterality: Left;   LAMINECTOMY WITH POSTERIOR LATERAL ARTHRODESIS LEVEL 2 N/A 07/16/2018   Procedure: Posterior lumbar fusion with instrumentation at L3-4 with repeat facetectomy L3-4;  Surgeon: Joshua Alm RAMAN, MD;  Location: [facility];  Service: Neurosurgery;  Laterality: N/A;  Posterior lumbar fusion with instrumentation at L3-4 with repeat facetectomy L3-4   LUMBAR LAMINECTOMY/DECOMPRESSION MICRODISCECTOMY N/A 10/04/2013   Procedure: LUMBAR TWO TO THREE LUMBAR LAMINECTOMY/DECOMPRESSION MICRODISCECTOMY 1 LEVEL;  Surgeon: Catalina CHRISTELLA Stains, MD;  Location: [facility];  Service: Neurosurgery;  Laterality: N/A;  L2-3 Laminectomy   POLYPECTOMY  12/26/2023   Procedure: POLYPECTOMY, INTESTINE;  Surgeon: Aundria, Ladell POUR, MD;  Location: [facility];  Service: Gastroenterology;;   POSTERIOR LAMINECTOMY / DECOMPRESSION LUMBAR SPINE  2006   TRANSESOPHAGEAL ECHOCARDIOGRAM  2011     Current Outpatient Medications  Medication Sig Dispense Refill   ACCU-CHEK AVIVA PLUS test strip TEST BLOOD SUGAR EVERY DAY 100 strip 3   amiodarone  (PACERONE ) 400 MG tablet Take 1 tablet (400 mg total) by mouth daily. 60 tablet 0   apixaban  (ELIQUIS ) 5 MG TABS tablet Take 5 mg by mouth 2 (two) times daily.     baclofen   (LIORESAL ) 10 MG tablet Take 1 tablet (10 mg total) by mouth 2 (two) times daily. (Patient taking differently: Take 10 mg by mouth as needed.) 60 tablet 1   chlorthalidone (HYGROTON) 25 MG tablet TAKE 1 TABLET EVERY DAY 90 tablet 3   docusate (COLACE) 50 MG/5ML liquid Take by mouth daily. (Patient taking differently: Take by mouth as needed.)     ELIQUIS  5 MG TABS tablet TAKE 1 TABLET TWICE DAILY 180 tablet 3   fluticasone (FLONASE) 50 MCG/ACT nasal spray Place 2 sprays into both nostrils daily. (Patient taking differently: Place 2 sprays into both nostrils as needed.)     gabapentin  (NEURONTIN ) 100 MG capsule Take 1 capsule (100 mg total) by mouth daily. 90 capsule 3   hydrALAZINE  (APRESOLINE ) 25 MG tablet Take 1 tablet (25 mg total) by mouth 2 times daily at 12 noon and 4 pm. 60 tablet 1   Iron -Vitamin C  65-125 MG TABS Take 1 tablet by mouth 2 (two) times daily. (Patient taking differently: Take 1 tablet by mouth daily.) 180 tablet 1   loratadine  (CLARITIN ) 10 MG tablet Take 1 tablet (10 mg total) by mouth daily. (Patient taking differently: Take 10 mg by mouth as needed.) 90 tablet 0   losartan  (COZAAR ) 25 MG tablet Take 1 tablet (25 mg total) by mouth daily. 90 tablet 1   metFORMIN  (GLUCOPHAGE ) 500 MG tablet TAKE 1 TABLET TWICE DAILY 180 tablet 3   metoprolol  succinate (TOPROL -XL) 50 MG 24 hr tablet TAKE 1 TABLET EVERY DAY 90 tablet 3   montelukast  (SINGULAIR ) 10 MG tablet Take 1 tablet (10 mg total) by mouth daily. 90 tablet 2   omeprazole (PRILOSEC) 40 MG capsule TAKE 1 CAPSULE EVERY DAY 30 MINUTES BEFORE A MEAL 90 capsule 3   potassium chloride  (KLOR-CON ) 10 MEQ tablet TAKE 1 TABLET EVERY DAY 90 tablet 3   rosuvastatin  (CRESTOR ) 20 MG tablet TAKE 1 TABLET EVERY DAY 90 tablet 3   tamsulosin  (FLOMAX ) 0.4 MG CAPS capsule Take 1 capsule (0.4 mg total) by mouth daily. 90 capsule 3   trimethoprim  (TRIMPEX ) 100 MG tablet Take 1 tablet (100 mg total) by mouth daily. 90 tablet 3   No current  facility-administered medications for this visit.    Allergies:   Morphine  and codeine, Ace inhibitors, Diazepam , and Tape    Social History:   reports that he has never smoked. He has never used smokeless tobacco. He reports current alcohol  use of about 14.0 standard drinks of alcohol  per week. He reports that he does not use drugs.   Family History:  family history includes Diabetes in his father; Heart disease in his mother; Leukemia in his father.    ROS:     Review of Systems  Constitutional: Negative.   HENT: Negative.    Eyes: Negative.   Respiratory: Negative.    Gastrointestinal: Negative.   Genitourinary: Negative.  Musculoskeletal: Negative.   Skin: Negative.   Neurological: Negative.   Endo/Heme/Allergies: Negative.   Psychiatric/Behavioral: Negative.    All other systems reviewed and are negative.     All other systems are reviewed and negative.    PHYSICAL EXAM: VS:  BP 120/72   Pulse 73   Ht 5' 7 (1.702 m)   Wt 252 lb 12.8 oz (114.7 kg)   SpO2 97%   BMI 39.59 kg/m  , BMI Body mass index is 39.59 kg/m. Last weight:  Wt Readings from Last 3 Encounters:  05/17/24 252 lb 12.8 oz (114.7 kg)  04/18/24 249 lb (112.9 kg)  04/16/24 247 lb 3.2 oz (112.1 kg)     Physical Exam Vitals reviewed.  Constitutional:      Appearance: Normal appearance. He is normal weight.  HENT:     Head: Normocephalic.     Nose: Nose normal.     Mouth/Throat:     Mouth: Mucous membranes are moist.  Eyes:     Pupils: Pupils are equal, round, and reactive to light.  Cardiovascular:     Rate and Rhythm: Normal rate and regular rhythm.     Pulses: Normal pulses.     Heart sounds: Normal heart sounds.  Pulmonary:     Effort: Pulmonary effort is normal.  Abdominal:     General: Abdomen is flat. Bowel sounds are normal.  Musculoskeletal:        General: Normal range of motion.     Cervical back: Normal range of motion.  Skin:    General: Skin is warm.  Neurological:      General: No focal deficit present.     Mental Status: He is alert.  Psychiatric:        Mood and Affect: Mood normal.       EKG:   Recent Labs: 12/13/2023: ALT 40; TSH 1.860 04/15/2024: BUN 9; Creatinine 1.08; Hemoglobin 14.4; Platelet Count 182; Potassium 3.5; Sodium 140    Lipid Panel    Component Value Date/Time   CHOL 116 12/13/2023 1031   TRIG 120 12/13/2023 1031   HDL 61 12/13/2023 1031   CHOLHDL 1.9 12/13/2023 1031   LDLCALC 34 12/13/2023 1031      Other studies Reviewed: Additional studies/ records that were reviewed today include:  Review of the above records demonstrates:       No data to display            ASSESSMENT AND PLAN:    ICD-10-CM   1. SOB (shortness of breath)  R06.02 MYOCARDIAL PERFUSION IMAGING    losartan  (COZAAR ) 25 MG tablet    hydrALAZINE  (APRESOLINE ) 25 MG tablet   set up stress test as LVEf dropped to 45%    2. Atrial fibrillation, currently in sinus rhythm  Z86.79 MYOCARDIAL PERFUSION IMAGING    losartan  (COZAAR ) 25 MG tablet    hydrALAZINE  (APRESOLINE ) 25 MG tablet    3. Mixed hyperlipidemia  E78.2 MYOCARDIAL PERFUSION IMAGING    losartan  (COZAAR ) 25 MG tablet    hydrALAZINE  (APRESOLINE ) 25 MG tablet    4. Shortness of breath  R06.02 MYOCARDIAL PERFUSION IMAGING    losartan  (COZAAR ) 25 MG tablet    hydrALAZINE  (APRESOLINE ) 25 MG tablet    5. Hypertension associated with diabetes (HCC)  E11.59 MYOCARDIAL PERFUSION IMAGING   I15.2 losartan  (COZAAR ) 25 MG tablet    hydrALAZINE  (APRESOLINE ) 25 MG tablet    6. Obstructive sleep apnea  G47.33 MYOCARDIAL PERFUSION IMAGING    losartan  (COZAAR )  25 MG tablet    hydrALAZINE  (APRESOLINE ) 25 MG tablet    7. Paroxysmal A-fib (HCC)  I48.0 MYOCARDIAL PERFUSION IMAGING    losartan  (COZAAR ) 25 MG tablet    hydrALAZINE  (APRESOLINE ) 25 MG tablet    8. Tachycardia-induced cardiomyopathy (HCC)  I42.8 MYOCARDIAL PERFUSION IMAGING    losartan  (COZAAR ) 25 MG tablet    hydrALAZINE   (APRESOLINE ) 25 MG tablet   Due to Afib.with RVR, but niow NSR. LVEF 45% from 58 % 2 years ago, but will r/o CAd, and do stress test       Problem List Items Addressed This Visit       Cardiovascular and Mediastinum   Hypertension associated with diabetes (HCC)   Relevant Medications   losartan  (COZAAR ) 25 MG tablet   hydrALAZINE  (APRESOLINE ) 25 MG tablet   Other Relevant Orders   MYOCARDIAL PERFUSION IMAGING     Respiratory   Obstructive sleep apnea   Relevant Medications   losartan  (COZAAR ) 25 MG tablet   hydrALAZINE  (APRESOLINE ) 25 MG tablet   Other Relevant Orders   MYOCARDIAL PERFUSION IMAGING     Other   Hyperlipidemia   Relevant Medications   losartan  (COZAAR ) 25 MG tablet   hydrALAZINE  (APRESOLINE ) 25 MG tablet   Other Relevant Orders   MYOCARDIAL PERFUSION IMAGING   Shortness of breath   Relevant Medications   losartan  (COZAAR ) 25 MG tablet   hydrALAZINE  (APRESOLINE ) 25 MG tablet   Other Relevant Orders   MYOCARDIAL PERFUSION IMAGING   Atrial fibrillation, currently in sinus rhythm   Relevant Medications   losartan  (COZAAR ) 25 MG tablet   hydrALAZINE  (APRESOLINE ) 25 MG tablet   Other Relevant Orders   MYOCARDIAL PERFUSION IMAGING   Other Visit Diagnoses       SOB (shortness of breath)    -  Primary   set up stress test as LVEf dropped to 45%   Relevant Medications   losartan  (COZAAR ) 25 MG tablet   hydrALAZINE  (APRESOLINE ) 25 MG tablet   Other Relevant Orders   MYOCARDIAL PERFUSION IMAGING     Paroxysmal A-fib (HCC)       Relevant Medications   losartan  (COZAAR ) 25 MG tablet   hydrALAZINE  (APRESOLINE ) 25 MG tablet   Other Relevant Orders   MYOCARDIAL PERFUSION IMAGING     Tachycardia-induced cardiomyopathy (HCC)       Due to Afib.with RVR, but niow NSR. LVEF 45% from 58 % 2 years ago, but will r/o CAd, and do stress test   Relevant Medications   losartan  (COZAAR ) 25 MG tablet   hydrALAZINE  (APRESOLINE ) 25 MG tablet   Other Relevant Orders    MYOCARDIAL PERFUSION IMAGING          Disposition:   Return in about 3 weeks (around 06/07/2024) for stress test and f/u.    Total time spent: 35 minutes  Signed,  Brandon Bathe, MD  05/17/2024 9:47 AM    [facility] "

## 2024-05-21 ENCOUNTER — Telehealth: Payer: Self-pay

## 2024-05-21 NOTE — Telephone Encounter (Signed)
 Pt came by stating he started rx losartan  as discussed at appt last week. He asked if you want him to continue rx eliquis  as well? Please advise

## 2024-05-21 NOTE — Telephone Encounter (Signed)
 Lvm informing pt.

## 2024-06-04 ENCOUNTER — Telehealth: Payer: Self-pay | Admitting: Urology

## 2024-06-04 NOTE — Telephone Encounter (Signed)
 Pt called the office stating that he need a new prescription of trimethoprim  (TRIMPEX ) 100 MG tablet called into [facility]. He does have an appt scheduled on 08/12/24.

## 2024-06-05 ENCOUNTER — Other Ambulatory Visit: Payer: Self-pay

## 2024-06-05 DIAGNOSIS — R399 Unspecified symptoms and signs involving the genitourinary system: Secondary | ICD-10-CM

## 2024-06-05 MED ORDER — TRIMETHOPRIM 100 MG PO TABS: 100.0000 mg | ORAL_TABLET | Freq: Every day | ORAL | 0 refills | Status: DC

## 2024-06-05 MED ORDER — TRIMETHOPRIM 100 MG PO TABS: 100.0000 mg | ORAL_TABLET | Freq: Every day | ORAL | 0 refills | Status: AC

## 2024-06-05 NOTE — Telephone Encounter (Signed)
 Sent Rx in to [facility] pharmacy and let pt know.

## 2024-06-06 ENCOUNTER — Ambulatory Visit

## 2024-06-06 DIAGNOSIS — E1159 Type 2 diabetes mellitus with other circulatory complications: Secondary | ICD-10-CM

## 2024-06-06 DIAGNOSIS — R0602 Shortness of breath: Secondary | ICD-10-CM

## 2024-06-06 DIAGNOSIS — Z8679 Personal history of other diseases of the circulatory system: Secondary | ICD-10-CM

## 2024-06-06 DIAGNOSIS — E782 Mixed hyperlipidemia: Secondary | ICD-10-CM

## 2024-06-06 DIAGNOSIS — I480 Paroxysmal atrial fibrillation: Secondary | ICD-10-CM

## 2024-06-06 DIAGNOSIS — I428 Other cardiomyopathies: Secondary | ICD-10-CM

## 2024-06-06 DIAGNOSIS — G4733 Obstructive sleep apnea (adult) (pediatric): Secondary | ICD-10-CM

## 2024-06-06 MED ORDER — TECHNETIUM TC 99M SESTAMIBI GENERIC - CARDIOLITE
30.3000 | Freq: Once | INTRAVENOUS | Status: AC | PRN
  Administered 2024-06-06: 30.3 via INTRAVENOUS

## 2024-06-06 MED ORDER — TECHNETIUM TC 99M SESTAMIBI GENERIC - CARDIOLITE
10.8000 | Freq: Once | INTRAVENOUS | Status: AC | PRN
  Administered 2024-06-06: 10.8 via INTRAVENOUS

## 2024-06-13 ENCOUNTER — Ambulatory Visit: Admitting: Cardiovascular Disease

## 2024-07-18 ENCOUNTER — Ambulatory Visit: Admitting: Internal Medicine

## 2024-08-12 ENCOUNTER — Ambulatory Visit: Admitting: Urology

## 2024-08-14 ENCOUNTER — Inpatient Hospital Stay

## 2024-08-14 ENCOUNTER — Inpatient Hospital Stay: Admitting: Internal Medicine
